# Patient Record
Sex: Female | Born: 1970 | Race: White | Hispanic: No | Smoking: Former smoker
Health system: Southern US, Community
[De-identification: ages and names within clinical notes are randomized; demographics above are authoritative.]

## PROBLEM LIST (undated history)

## (undated) DIAGNOSIS — Z87442 Personal history of urinary calculi: Secondary | ICD-10-CM

## (undated) DIAGNOSIS — F32A Depression, unspecified: Secondary | ICD-10-CM

## (undated) DIAGNOSIS — R079 Chest pain, unspecified: Secondary | ICD-10-CM

## (undated) DIAGNOSIS — E039 Hypothyroidism, unspecified: Secondary | ICD-10-CM

## (undated) DIAGNOSIS — E669 Obesity, unspecified: Secondary | ICD-10-CM

## (undated) DIAGNOSIS — J309 Allergic rhinitis, unspecified: Secondary | ICD-10-CM

## (undated) DIAGNOSIS — F419 Anxiety disorder, unspecified: Secondary | ICD-10-CM

## (undated) DIAGNOSIS — H269 Unspecified cataract: Secondary | ICD-10-CM

## (undated) DIAGNOSIS — E119 Type 2 diabetes mellitus without complications: Secondary | ICD-10-CM

## (undated) DIAGNOSIS — I10 Essential (primary) hypertension: Secondary | ICD-10-CM

## (undated) DIAGNOSIS — J449 Chronic obstructive pulmonary disease, unspecified: Secondary | ICD-10-CM

## (undated) DIAGNOSIS — R739 Hyperglycemia, unspecified: Secondary | ICD-10-CM

## (undated) DIAGNOSIS — F329 Major depressive disorder, single episode, unspecified: Secondary | ICD-10-CM

## (undated) DIAGNOSIS — J45909 Unspecified asthma, uncomplicated: Secondary | ICD-10-CM

## (undated) DIAGNOSIS — Z720 Tobacco use: Secondary | ICD-10-CM

## (undated) DIAGNOSIS — E7800 Pure hypercholesterolemia, unspecified: Secondary | ICD-10-CM

## (undated) DIAGNOSIS — G2581 Restless legs syndrome: Secondary | ICD-10-CM

## (undated) HISTORY — DX: Depression, unspecified: F32.A

## (undated) HISTORY — DX: Pure hypercholesterolemia, unspecified: E78.00

## (undated) HISTORY — PX: TONSILLECTOMY: SUR1361

## (undated) HISTORY — DX: Major depressive disorder, single episode, unspecified: F32.9

## (undated) HISTORY — DX: Tobacco use: Z72.0

## (undated) HISTORY — PX: FOOT MASS EXCISION: SHX1663

## (undated) HISTORY — PX: ABDOMINAL HYSTERECTOMY: SHX81

## (undated) HISTORY — PX: OTHER SURGICAL HISTORY: SHX169

## (undated) HISTORY — PX: THYROID SURGERY: SHX805

## (undated) HISTORY — DX: Chronic obstructive pulmonary disease, unspecified: J44.9

## (undated) HISTORY — DX: Hyperglycemia, unspecified: R73.9

## (undated) HISTORY — PX: CHOLECYSTECTOMY: SHX55

## (undated) HISTORY — DX: Unspecified asthma, uncomplicated: J45.909

## (undated) HISTORY — DX: Essential (primary) hypertension: I10

## (undated) HISTORY — DX: Type 2 diabetes mellitus without complications: E11.9

## (undated) HISTORY — PX: RADICAL ABDOMINAL HYSTERECTOMY: SUR659

## (undated) HISTORY — DX: Obesity, unspecified: E66.9

## (undated) HISTORY — DX: Allergic rhinitis, unspecified: J30.9

## (undated) HISTORY — PX: EXCISION MASS NECK: SHX6703

## (undated) HISTORY — PX: NECK SURGERY: SHX720

## (undated) HISTORY — DX: Anxiety disorder, unspecified: F41.9

## (undated) HISTORY — DX: Unspecified cataract: H26.9

## (undated) HISTORY — DX: Chest pain, unspecified: R07.9

## (undated) HISTORY — DX: Restless legs syndrome: G25.81

---

## 2001-06-20 ENCOUNTER — Emergency Department (HOSPITAL_COMMUNITY): Admission: EM | Admit: 2001-06-20 | Discharge: 2001-06-20 | Payer: Self-pay | Admitting: Emergency Medicine

## 2010-10-18 DIAGNOSIS — R079 Chest pain, unspecified: Secondary | ICD-10-CM

## 2010-10-19 DIAGNOSIS — I2510 Atherosclerotic heart disease of native coronary artery without angina pectoris: Secondary | ICD-10-CM

## 2011-02-04 ENCOUNTER — Other Ambulatory Visit: Payer: Self-pay | Admitting: Internal Medicine

## 2011-02-04 DIAGNOSIS — R928 Other abnormal and inconclusive findings on diagnostic imaging of breast: Secondary | ICD-10-CM

## 2011-02-22 ENCOUNTER — Ambulatory Visit
Admission: RE | Admit: 2011-02-22 | Discharge: 2011-02-22 | Disposition: A | Discharge status: Home / Self Care | Payer: PRIVATE HEALTH INSURANCE | Source: Ambulatory Visit | Attending: Internal Medicine | Admitting: Internal Medicine

## 2011-02-22 ENCOUNTER — Other Ambulatory Visit: Payer: Self-pay | Admitting: Internal Medicine

## 2011-02-22 DIAGNOSIS — R928 Other abnormal and inconclusive findings on diagnostic imaging of breast: Secondary | ICD-10-CM

## 2011-08-04 ENCOUNTER — Other Ambulatory Visit: Payer: Self-pay | Admitting: Internal Medicine

## 2011-08-04 DIAGNOSIS — Z1231 Encounter for screening mammogram for malignant neoplasm of breast: Secondary | ICD-10-CM

## 2011-08-11 ENCOUNTER — Ambulatory Visit
Admission: RE | Admit: 2011-08-11 | Discharge: 2011-08-11 | Disposition: A | Discharge status: Home / Self Care | Payer: PRIVATE HEALTH INSURANCE | Source: Ambulatory Visit | Attending: Internal Medicine | Admitting: Internal Medicine

## 2011-08-11 DIAGNOSIS — Z1231 Encounter for screening mammogram for malignant neoplasm of breast: Secondary | ICD-10-CM

## 2012-01-23 DIAGNOSIS — R079 Chest pain, unspecified: Secondary | ICD-10-CM

## 2012-02-15 DIAGNOSIS — R079 Chest pain, unspecified: Secondary | ICD-10-CM

## 2012-05-02 HISTORY — PX: CARDIAC CATHETERIZATION: SHX172

## 2012-06-22 ENCOUNTER — Encounter: Payer: Self-pay | Admitting: Physician Assistant

## 2012-06-22 DIAGNOSIS — R079 Chest pain, unspecified: Secondary | ICD-10-CM

## 2012-07-03 ENCOUNTER — Encounter: Payer: Self-pay | Admitting: Cardiology

## 2012-07-06 ENCOUNTER — Encounter: Payer: PRIVATE HEALTH INSURANCE | Admitting: Physician Assistant

## 2012-07-13 ENCOUNTER — Encounter: Payer: Self-pay | Admitting: Physician Assistant

## 2012-07-13 ENCOUNTER — Other Ambulatory Visit: Payer: Self-pay | Admitting: Physician Assistant

## 2012-07-13 ENCOUNTER — Encounter: Payer: Self-pay | Admitting: *Deleted

## 2012-07-13 ENCOUNTER — Ambulatory Visit (INDEPENDENT_AMBULATORY_CARE_PROVIDER_SITE_OTHER): Payer: PRIVATE HEALTH INSURANCE | Admitting: Physician Assistant

## 2012-07-13 VITALS — BP 123/74 | HR 74 | Ht 64.0 in | Wt 213.8 lb

## 2012-07-13 DIAGNOSIS — E1159 Type 2 diabetes mellitus with other circulatory complications: Secondary | ICD-10-CM | POA: Insufficient documentation

## 2012-07-13 DIAGNOSIS — Z87891 Personal history of nicotine dependence: Secondary | ICD-10-CM | POA: Insufficient documentation

## 2012-07-13 DIAGNOSIS — R079 Chest pain, unspecified: Secondary | ICD-10-CM

## 2012-07-13 DIAGNOSIS — F17200 Nicotine dependence, unspecified, uncomplicated: Secondary | ICD-10-CM

## 2012-07-13 DIAGNOSIS — Z01810 Encounter for preprocedural cardiovascular examination: Secondary | ICD-10-CM

## 2012-07-13 DIAGNOSIS — I10 Essential (primary) hypertension: Secondary | ICD-10-CM

## 2012-07-13 DIAGNOSIS — Z720 Tobacco use: Secondary | ICD-10-CM

## 2012-07-13 LAB — PROTIME-INR

## 2012-07-13 MED ORDER — METOPROLOL TARTRATE 25 MG PO TABS: 25.0000 mg | ORAL_TABLET | Freq: Two times a day (BID) | ORAL | Status: DC

## 2012-07-13 MED ORDER — NITROGLYCERIN 0.4 MG SL SUBL: 0.4000 mg | SUBLINGUAL_TABLET | SUBLINGUAL | Status: DC | PRN

## 2012-07-13 NOTE — Assessment & Plan Note (Signed)
Patient was advised to stop smoking tobacco.

## 2012-07-13 NOTE — Progress Notes (Signed)
Primary Cardiologist: Simona Huh, MD (new)   HPI: Post hospital followup from [facility], following recent evaluation for chest pain.  Patient presented with long-standing history of CP, multiple CRFs, but no documented CAD. She had been previously evaluated by stress testing, with a negative dobutamine stress echocardiogram in June 2012, followed by a NL Lexiscan Cardiolite; EF 60%, in October 2013.  Serial cardiac markers were normal, as was a d-dimer level. EKG indicated NSR with no ischemic changes.  She presents to our office today, for the first time, with continued complaint of chest pain. In fact, she describes 2 different types: A long-standing, constant chest pain, as well as a "pressure" which occurs only on occasion, but is exacerbated by exertion or moderate activity.  Allergies  Allergen Reactions  . Hydromorphone   . Valproic Acid And Related     Current Outpatient Prescriptions  Medication Sig Dispense Refill  . aspirin 81 MG tablet Take 81 mg by mouth daily.      Marland Kitchen gabapentin (NEURONTIN) 300 MG capsule Take 300 mg by mouth 3 (three) times daily.      Marland Kitchen lisinopril (PRINIVIL,ZESTRIL) 20 MG tablet Take 20 mg by mouth daily.      . sertraline (ZOLOFT) 100 MG tablet Take 100 mg by mouth daily.       No current facility-administered medications for this visit.    Past Medical History  Diagnosis Date  . Chest pain syndrome   . HTN (hypertension)   . Anxiety and depression   . Asthma   . Nephrolithiasis   . Obesity   . Hyperglycemia   . Obesity   . Tobacco abuse     Past Surgical History  Procedure Laterality Date  . Cholecystectomy    . Hysteectomy --- unknown    . Cesarean section    . Neck surgery    . Left foot surgery      History   Social History  . Marital Status: Single    Spouse Name: N/A    Number of Children: N/A  . Years of Education: N/A   Occupational History  . Not on file.   Social History Main Topics  . Smoking status: Current Every  Day Smoker -- 0.25 packs/day    Types: Cigarettes  . Smokeless tobacco: Not on file  . Alcohol Use: No  . Drug Use: No  . Sexually Active: Not on file   Other Topics Concern  . Not on file   Social History Narrative  . No narrative on file    No family history on file.  ROS: no nausea, vomiting; no fever, chills; no melena, hematochezia; no claudication  PHYSICAL EXAM: BP 123/74  Pulse 74  Ht 5\' 4"  (1.626 m)  Wt 213 lb 12.8 oz (96.979 kg)  BMI 36.68 kg/m2 GENERAL: 42 year old female, obese; NAD HEENT: NCAT, PERRLA, EOMI; sclera clear; no xanthelasma NECK: palpable bilateral carotid pulses, no bruits; no JVD; no TM LUNGS: CTA bilaterally CARDIAC: RRR (S1, S2); no significant murmurs; no rubs or gallops ABDOMEN: soft, protuberant EXTREMETIES: no significant peripheral edema SKIN: warm/dry; no obvious rash/lesions MUSCULOSKELETAL: no joint deformity NEURO: no focal deficit; NL affect   EKG:    ASSESSMENT & PLAN:  Chest pain syndrome Recommendation is to now pursue an invasive evaluation with a diagnostic cardiac catheterization, so as to exclude significant underlying CAD. She presents with long-standing history of CP, multiple CRFs, and 2 previous normal stress tests, most recently in October 2013. The patient is quite agreeable  with this recommendation. The risks/benefits were reviewed, and plan was approved by Dr. Diona Browner, who saw the patient initially in consultation, here at [facility]. Regarding medications, we will provide prescription for NTG and start low-dose beta blocker with Lopressor 25 twice a day. Would also recommend adding a statin, if she has evidence of significant CAD.  HTN (hypertension) Followed by primary M.D. Well-controlled on current medication regimen.  Tobacco abuse Patient was advised to stop smoking tobacco.    Gene Rithy Mandley, PAC

## 2012-07-13 NOTE — Assessment & Plan Note (Signed)
Followed by primary M.D. Well-controlled on current medication regimen.

## 2012-07-13 NOTE — Assessment & Plan Note (Signed)
Recommendation is to now pursue an invasive evaluation with a diagnostic cardiac catheterization, so as to exclude significant underlying CAD. The patient is quite agreeable with this recommendation. The risks/benefits were reviewed, and plan was approved by Dr. Diona Browner, who saw the patient initially in consultation, here at [facility]. Regarding medications, we will provide prescription for NTG and start low-dose beta blocker with Lopressor 25 twice a day. Would also recommend adding a statin, if she has evidence of significant CAD.

## 2012-07-13 NOTE — Patient Instructions (Signed)
   Nitroglycerin as needed for severe chest pain only  Lopressor 25mg  twice a day    Left JV heart cath - see info sheet given  Follow up will be given after above

## 2012-07-17 ENCOUNTER — Telehealth: Payer: Self-pay | Admitting: Physician Assistant

## 2012-07-17 NOTE — Telephone Encounter (Signed)
AUTH COMPLETE

## 2012-07-17 NOTE — Telephone Encounter (Signed)
Left JV heart cath - Wednesday, 3/19 - 11:30 - Excell Seltzer

## 2012-07-18 ENCOUNTER — Encounter (HOSPITAL_BASED_OUTPATIENT_CLINIC_OR_DEPARTMENT_OTHER): Payer: Self-pay | Admitting: *Deleted

## 2012-07-18 ENCOUNTER — Encounter (HOSPITAL_BASED_OUTPATIENT_CLINIC_OR_DEPARTMENT_OTHER)
Admission: RE | Disposition: A | Discharge status: Home / Self Care | Payer: Self-pay | Source: Ambulatory Visit | Attending: Cardiovascular Disease

## 2012-07-18 ENCOUNTER — Inpatient Hospital Stay (HOSPITAL_BASED_OUTPATIENT_CLINIC_OR_DEPARTMENT_OTHER)
Admission: RE | Admit: 2012-07-18 | Discharge: 2012-07-18 | Disposition: A | Discharge status: Home / Self Care | Payer: PRIVATE HEALTH INSURANCE | Source: Ambulatory Visit | Attending: Cardiovascular Disease | Admitting: Cardiovascular Disease

## 2012-07-18 DIAGNOSIS — F17200 Nicotine dependence, unspecified, uncomplicated: Secondary | ICD-10-CM | POA: Insufficient documentation

## 2012-07-18 DIAGNOSIS — I10 Essential (primary) hypertension: Secondary | ICD-10-CM | POA: Insufficient documentation

## 2012-07-18 DIAGNOSIS — R079 Chest pain, unspecified: Secondary | ICD-10-CM

## 2012-07-18 DIAGNOSIS — R0789 Other chest pain: Secondary | ICD-10-CM | POA: Insufficient documentation

## 2012-07-18 SURGERY — JV LEFT HEART CATHETERIZATION WITH CORONARY ANGIOGRAM
Anesthesia: Moderate Sedation

## 2012-07-18 MED ORDER — SODIUM CHLORIDE 0.9 % IV SOLN
INTRAVENOUS | Status: DC
  Administered 2012-07-18: 11:00:00 via INTRAVENOUS

## 2012-07-18 MED ORDER — SODIUM CHLORIDE 0.9 % IJ SOLN
3.0000 mL | Freq: Two times a day (BID) | INTRAMUSCULAR | Status: DC
Start: 2012-07-18 — End: 2012-07-18

## 2012-07-18 MED ORDER — SODIUM CHLORIDE 0.9 % IJ SOLN: 3.0000 mL | INTRAMUSCULAR | Status: DC | PRN

## 2012-07-18 MED ORDER — SODIUM CHLORIDE 0.9 % IV SOLN: 1.0000 mL/kg/h | INTRAVENOUS | Status: DC

## 2012-07-18 MED ORDER — ACETAMINOPHEN 325 MG PO TABS: 650.0000 mg | ORAL_TABLET | ORAL | Status: DC | PRN

## 2012-07-18 MED ORDER — SODIUM CHLORIDE 0.9 % IV SOLN: 250.0000 mL | INTRAVENOUS | Status: DC | PRN

## 2012-07-18 MED ORDER — ONDANSETRON HCL 4 MG/2ML IJ SOLN: 4.0000 mg | Freq: Four times a day (QID) | INTRAMUSCULAR | Status: DC | PRN

## 2012-07-18 MED ORDER — ASPIRIN 81 MG PO CHEW
324.0000 mg | CHEWABLE_TABLET | ORAL | Status: AC
  Administered 2012-07-18: 243 mg via ORAL

## 2012-07-18 NOTE — Interval H&P Note (Signed)
History and Physical Interval Note:  07/18/2012 1:01 PM  [name]  has presented today for surgery, with the diagnosis of abd st  The various methods of treatment have been discussed with the patient and family. After consideration of risks, benefits and other options for treatment, the patient has consented to  Procedure(s): JV LEFT HEART CATHETERIZATION WITH CORONARY ANGIOGRAM (N/A) as a surgical intervention .  The patient's history has been reviewed, patient examined, no change in status, stable for surgery.  I have reviewed the patient's chart and labs.  Questions were answered to the patient's satisfaction.     Sherry Glass

## 2012-07-18 NOTE — CV Procedure (Signed)
   Cardiac Catheterization Procedure Note  Name: Sherry Glass MRN: [id number] DOB: May 10, 1970  Procedure: Left Heart Cath, Selective Coronary Angiography, LV angiography  Indication: Chest pain  Procedural details: The right groin was prepped, draped, and anesthetized with 1% lidocaine. Using modified Seldinger technique, a 4 French sheath was introduced into the right femoral artery. Standard Judkins catheters were used for coronary angiography and left ventriculography. Catheter exchanges were performed over a guidewire. There were no immediate procedural complications. The patient was transferred to the post catheterization recovery area for further monitoring.  Procedural Findings: Hemodynamics:  AO 146/79 mean 105 LV 154/18   Coronary angiography: Coronary dominance: right  Left mainstem: Widely patent. No obstructive disease  Left anterior descending (LAD): Large vessel with large D1. Smooth throughout. No obstructive disease.  Left circumflex (LCx): Widely patent. Large vessel proximally. OM branches patent.  Right coronary artery (RCA): Large, dominant vessel. Smooth throughout. PDA and PLA branches without stenosis.  Left ventriculography: Left ventricular systolic function is normal, LVEF is estimated at 55-65%, there is no significant mitral regurgitation   Final Conclusions:   1. Normal coronary arteries 2. Normal LV function  Recommendations: Suspect noncardiac chest pain.  [name] 07/18/2012, 1:22 PM

## 2012-07-18 NOTE — H&P (View-Only) (Signed)
Primary Cardiologist: Simona Huh, MD (new)   HPI: Post hospital followup from [facility], following recent evaluation for chest pain.  Patient presented with long-standing history of CP, multiple CRFs, but no documented CAD. She had been previously evaluated by stress testing, with a negative dobutamine stress echocardiogram in June 2012, followed by a NL Lexiscan Cardiolite; EF 60%, in October 2013.  Serial cardiac markers were normal, as was a d-dimer level. EKG indicated NSR with no ischemic changes.  She presents to our office today, for the first time, with continued complaint of chest pain. In fact, she describes 2 different types: A long-standing, constant chest pain, as well as a "pressure" which occurs only on occasion, but is exacerbated by exertion or moderate activity.  Allergies  Allergen Reactions  . Hydromorphone   . Valproic Acid And Related     Current Outpatient Prescriptions  Medication Sig Dispense Refill  . aspirin 81 MG tablet Take 81 mg by mouth daily.      Marland Kitchen gabapentin (NEURONTIN) 300 MG capsule Take 300 mg by mouth 3 (three) times daily.      Marland Kitchen lisinopril (PRINIVIL,ZESTRIL) 20 MG tablet Take 20 mg by mouth daily.      . sertraline (ZOLOFT) 100 MG tablet Take 100 mg by mouth daily.       No current facility-administered medications for this visit.    Past Medical History  Diagnosis Date  . Chest pain syndrome   . HTN (hypertension)   . Anxiety and depression   . Asthma   . Nephrolithiasis   . Obesity   . Hyperglycemia   . Obesity   . Tobacco abuse     Past Surgical History  Procedure Laterality Date  . Cholecystectomy    . Hysteectomy --- unknown    . Cesarean section    . Neck surgery    . Left foot surgery      History   Social History  . Marital Status: Single    Spouse Name: N/A    Number of Children: N/A  . Years of Education: N/A   Occupational History  . Not on file.   Social History Main Topics  . Smoking status: Current Every  Day Smoker -- 0.25 packs/day    Types: Cigarettes  . Smokeless tobacco: Not on file  . Alcohol Use: No  . Drug Use: No  . Sexually Active: Not on file   Other Topics Concern  . Not on file   Social History Narrative  . No narrative on file    No family history on file.  ROS: no nausea, vomiting; no fever, chills; no melena, hematochezia; no claudication  PHYSICAL EXAM: BP 123/74  Pulse 74  Ht 5\' 4"  (1.626 m)  Wt 213 lb 12.8 oz (96.979 kg)  BMI 36.68 kg/m2 GENERAL: 42 year old female, obese; NAD HEENT: NCAT, PERRLA, EOMI; sclera clear; no xanthelasma NECK: palpable bilateral carotid pulses, no bruits; no JVD; no TM LUNGS: CTA bilaterally CARDIAC: RRR (S1, S2); no significant murmurs; no rubs or gallops ABDOMEN: soft, protuberant EXTREMETIES: no significant peripheral edema SKIN: warm/dry; no obvious rash/lesions MUSCULOSKELETAL: no joint deformity NEURO: no focal deficit; NL affect   EKG:    ASSESSMENT & PLAN:  Chest pain syndrome Recommendation is to now pursue an invasive evaluation with a diagnostic cardiac catheterization, so as to exclude significant underlying CAD. She presents with long-standing history of CP, multiple CRFs, and 2 previous normal stress tests, most recently in October 2013. The patient is quite agreeable  with this recommendation. The risks/benefits were reviewed, and plan was approved by Dr. Diona Browner, who saw the patient initially in consultation, here at [facility]. Regarding medications, we will provide prescription for NTG and start low-dose beta blocker with Lopressor 25 twice a day. Would also recommend adding a statin, if she has evidence of significant CAD.  HTN (hypertension) Followed by primary M.D. Well-controlled on current medication regimen.  Tobacco abuse Patient was advised to stop smoking tobacco.    Gene Sevin Farone, PAC

## 2012-08-01 ENCOUNTER — Ambulatory Visit (INDEPENDENT_AMBULATORY_CARE_PROVIDER_SITE_OTHER): Payer: PRIVATE HEALTH INSURANCE | Admitting: Physician Assistant

## 2012-08-01 ENCOUNTER — Encounter: Payer: Self-pay | Admitting: Physician Assistant

## 2012-08-01 VITALS — BP 131/88 | HR 84 | Ht 64.0 in | Wt 216.0 lb

## 2012-08-01 DIAGNOSIS — R079 Chest pain, unspecified: Secondary | ICD-10-CM

## 2012-08-01 NOTE — Patient Instructions (Signed)
   Stop Aspirin Continue all other current medications. Follow up as needed

## 2012-08-01 NOTE — Progress Notes (Signed)
Primary Cardiologist: Simona Huh, MD   HPI: Post catheterization followup.  When last seen in office, patient was referred for a diagnostic cardiac catheterization for definitive exclusion of underlying CAD, in the context of long-standing history of CP, multiple CRFs, and 2 previous normal stress tests, most recently in October 2013.   - Cardiac catheterization, March 19: Normal coronary arteries and normal LVF (EF 55-60%)  Given this, the patient's symptoms were felt to be noncardiac in etiology.  She returns today reporting no complications of the R groin incision site. She did, however, become quite tearful, over the concern that she may lose her job due to numerous recent absences from work, including a recent hospitalization.  Allergies  Allergen Reactions  . Hydromorphone   . Valproic Acid And Related     Current Outpatient Prescriptions  Medication Sig Dispense Refill  . gabapentin (NEURONTIN) 300 MG capsule Take 300 mg by mouth 3 (three) times daily.      Marland Kitchen lisinopril (PRINIVIL,ZESTRIL) 20 MG tablet Take 20 mg by mouth daily.      . metoprolol tartrate (LOPRESSOR) 25 MG tablet Take 1 tablet (25 mg total) by mouth 2 (two) times daily.  60 tablet  6  . nitroGLYCERIN (NITROSTAT) 0.4 MG SL tablet Place 1 tablet (0.4 mg total) under the tongue every 5 (five) minutes as needed for chest pain.  25 tablet  3  . sertraline (ZOLOFT) 100 MG tablet Take 100 mg by mouth daily.       No current facility-administered medications for this visit.    Past Medical History  Diagnosis Date  . Chest pain syndrome   . HTN (hypertension)   . Anxiety and depression   . Asthma   . Nephrolithiasis   . Obesity   . Hyperglycemia   . Obesity   . Tobacco abuse     Past Surgical History  Procedure Laterality Date  . Cholecystectomy    . Hysteectomy --- unknown    . Cesarean section    . Neck surgery    . Left foot surgery      History   Social History  . Marital Status: Single   Spouse Name: N/A    Number of Children: N/A  . Years of Education: N/A   Occupational History  . Not on file.   Social History Main Topics  . Smoking status: Former Smoker -- 0.25 packs/day    Types: Cigarettes    Quit date: 07/24/2012  . Smokeless tobacco: Not on file  . Alcohol Use: No  . Drug Use: No  . Sexually Active: Not on file   Other Topics Concern  . Not on file   Social History Narrative  . No narrative on file    No family history on file.  ROS: no nausea, vomiting; no fever, chills; no melena, hematochezia; no claudication  PHYSICAL EXAM: BP 131/88  Pulse 84  Ht 5\' 4"  (1.626 m)  Wt 216 lb (97.977 kg)  BMI 37.06 kg/m2 GENERAL: 42 year old female, obese; NAD  HEENT: NCAT, PERRLA, EOMI; sclera clear; no xanthelasma  NECK: palpable bilateral carotid pulses, no bruits; no JVD; no TM  LUNGS: CTA bilaterally  CARDIAC: RRR (S1, S2); no significant murmurs; no rubs or gallops  ABDOMEN: soft, protuberant  EXTREMETIES: no significant peripheral edema  SKIN: warm/dry; no obvious rash/lesions  MUSCULOSKELETAL: no joint deformity  NEURO: no focal deficit; NL affect    EKG:   ASSESSMENT & PLAN:  Chest pain syndrome I reviewed the results  of her recent normal cardiac catheterization with her, and tried to provide reassurance that from a cardiac standpoint there is no obvious source for her long-standing history of chest pain. She appeared to understand this, but became somewhat distraught over the fact that this question remains unanswered. She is also concerned that she may lose her job, given that she has missed work on several occasions, including a recent hospitalization. We assured her that we would do whatever we could to help her in this regard, but that she is to otherwise resume followup with Dr. Sherryll Burger for ongoing evaluation of her recurrent symptoms. From a cardiac standpoint, she no longer needs to be on ASA, and may return to Dr. Diona Browner on an as-needed  basis.    Gene Jaxxen Voong, PAC

## 2012-08-01 NOTE — Assessment & Plan Note (Signed)
I reviewed the results of her recent normal cardiac catheterization with her, and tried to provide reassurance that from a cardiac standpoint there is no obvious source for her long-standing history of chest pain. She appeared to understand this, but became somewhat distraught over the fact that this question remains unanswered. She is also concerned that she may lose her job, given that she has missed work on several occasions, including a recent hospitalization. We assured her that we would do whatever we could to help her in this regard, but that she is to otherwise resume followup with Dr. Sherryll Burger for ongoing evaluation of her recurrent symptoms. From a cardiac standpoint, she no longer needs to be on ASA, and may return to Dr. Diona Browner on an as-needed basis.

## 2012-10-14 DIAGNOSIS — R079 Chest pain, unspecified: Secondary | ICD-10-CM

## 2016-05-11 DIAGNOSIS — H25032 Anterior subcapsular polar age-related cataract, left eye: Secondary | ICD-10-CM | POA: Diagnosis not present

## 2016-05-11 DIAGNOSIS — E119 Type 2 diabetes mellitus without complications: Secondary | ICD-10-CM | POA: Diagnosis not present

## 2016-05-11 DIAGNOSIS — H25042 Posterior subcapsular polar age-related cataract, left eye: Secondary | ICD-10-CM | POA: Diagnosis not present

## 2016-05-11 DIAGNOSIS — H2513 Age-related nuclear cataract, bilateral: Secondary | ICD-10-CM | POA: Diagnosis not present

## 2016-05-11 LAB — HM DIABETES EYE EXAM

## 2016-05-13 ENCOUNTER — Ambulatory Visit (INDEPENDENT_AMBULATORY_CARE_PROVIDER_SITE_OTHER): Payer: Medicaid Other

## 2016-05-13 ENCOUNTER — Other Ambulatory Visit: Payer: Self-pay | Admitting: Family

## 2016-05-13 ENCOUNTER — Ambulatory Visit (INDEPENDENT_AMBULATORY_CARE_PROVIDER_SITE_OTHER): Payer: Medicaid Other | Admitting: Family

## 2016-05-13 ENCOUNTER — Encounter: Payer: Self-pay | Admitting: Family

## 2016-05-13 VITALS — BP 136/80 | HR 73 | Temp 97.1°F | Ht 63.0 in | Wt 210.4 lb

## 2016-05-13 DIAGNOSIS — J449 Chronic obstructive pulmonary disease, unspecified: Secondary | ICD-10-CM

## 2016-05-13 DIAGNOSIS — E669 Obesity, unspecified: Secondary | ICD-10-CM | POA: Insufficient documentation

## 2016-05-13 DIAGNOSIS — Z0000 Encounter for general adult medical examination without abnormal findings: Secondary | ICD-10-CM | POA: Diagnosis not present

## 2016-05-13 DIAGNOSIS — F17200 Nicotine dependence, unspecified, uncomplicated: Secondary | ICD-10-CM

## 2016-05-13 DIAGNOSIS — F17210 Nicotine dependence, cigarettes, uncomplicated: Secondary | ICD-10-CM | POA: Insufficient documentation

## 2016-05-13 DIAGNOSIS — E1165 Type 2 diabetes mellitus with hyperglycemia: Secondary | ICD-10-CM

## 2016-05-13 DIAGNOSIS — E119 Type 2 diabetes mellitus without complications: Secondary | ICD-10-CM | POA: Insufficient documentation

## 2016-05-13 LAB — BAYER DCA HB A1C WAIVED: HB A1C (BAYER DCA - WAIVED): 8.5 % — ABNORMAL HIGH (ref <7.0)

## 2016-05-13 MED ORDER — CYCLOBENZAPRINE HCL 10 MG PO TABS: 10.0000 mg | ORAL_TABLET | Freq: Three times a day (TID) | ORAL | 0 refills | Status: DC | PRN

## 2016-05-13 MED ORDER — METFORMIN HCL ER 500 MG PO TB24: 500.0000 mg | ORAL_TABLET | Freq: Every day | ORAL | 1 refills | Status: DC

## 2016-05-13 MED ORDER — FLUTICASONE FUROATE-VILANTEROL 100-25 MCG/INH IN AEPB: 1.0000 | INHALATION_SPRAY | Freq: Every day | RESPIRATORY_TRACT | 6 refills | Status: DC

## 2016-05-13 NOTE — Progress Notes (Signed)
Subjective:    Patient ID: Sherry Glass, female    DOB: [birth year], 46 y.o.   MRN: [id number]  Pt presents to the office today to establish care. PT states she has taken her blood glucose at home and her blood sugar was running in the 400's. Pt currently not taking any medications at this time. Pt denies any headache, palpitations or edema at this time. PT is a current smoker and states she has SOB at times.   Diabetes  She presents for her initial diabetic visit. She has type 2 diabetes mellitus. There are no hypoglycemic associated symptoms. Associated symptoms include blurred vision and visual change. Pertinent negatives for diabetes include no foot paresthesias and no polyphagia. There are no hypoglycemic complications. Symptoms are worsening. Pertinent negatives for diabetic complications include no CVA, heart disease, nephropathy or peripheral neuropathy. Risk factors for coronary artery disease include diabetes mellitus, obesity, stress, post-menopausal and sedentary lifestyle. When asked about current treatments, none were reported. Eye exam is current (05/11/16).  Leg Pain   There was no injury mechanism. The pain is present in the left leg, right hip, right leg and left hip. The pain is at a severity of 8/10. The pain is moderate. The pain has been intermittent since onset. She reports no foreign bodies present. She has tried rest and NSAIDs for the symptoms. The treatment provided mild relief.  COPD PT currently smoking "about 8 cigarettes a day".     Review of Systems  Eyes: Positive for blurred vision.  Endocrine: Negative for polyphagia.  All other systems reviewed and are negative.  Social History  Substance Use Topics  . Smoking status: Current Every Day Smoker  . Smokeless tobacco: Never Used  . Alcohol use Yes     Comment: For special occasions   Family History  Problem Relation Age of Onset  . Adopted: Yes  . Family history unknown: Yes        Objective:   Physical Exam  Constitutional: She is oriented to person, place, and time. She appears well-developed and well-nourished. No distress.  Obese   HENT:  Head: Normocephalic and atraumatic.  Right Ear: External ear normal.  Mouth/Throat: Oropharynx is clear and moist.  Eyes: Pupils are equal, round, and reactive to light.  Neck: Normal range of motion. Neck supple. No thyromegaly present.  Cardiovascular: Normal rate, regular rhythm, normal heart sounds and intact distal pulses.   No murmur heard. Pulmonary/Chest: Effort normal. No respiratory distress. She has no wheezes. She has rhonchi.  Abdominal: Soft. Bowel sounds are normal. She exhibits no distension. There is no tenderness.  Musculoskeletal: Normal range of motion. She exhibits no edema or tenderness.  Neurological: She is alert and oriented to person, place, and time. She has normal reflexes. No cranial nerve deficit.  Skin: Skin is warm and dry.  Psychiatric: She has a normal mood and affect. Her behavior is normal. Judgment and thought content normal.  Vitals reviewed.     BP 136/80   Pulse 73   Temp 97.1 F (36.2 C) (Oral)   Ht _0  (1.6 m)   Wt 210 lb 6.4 oz (95.4 kg)   BMI 37.27 kg/m      Assessment & Plan:  1. Annual physical exam - Bayer DCA Hb A1c Waived - CMP14+EGFR - Lipid panel - Microalbumin / creatinine urine ratio - Thyroid Panel With TSH - VITAMIN D 25 Hydroxy (Vit-D Deficiency, Fractures) - DG Chest 2 View; Future  2. Obesity (BMI 30-39.9) -  CMP14+EGFR  3. Type 2 diabetes mellitus with hyperglycemia, without long-term current use of insulin (HCC) -Pt started on metformin today -Low carb diet discussed -Pt needs appt with Tammy asap as she is  New diabetic - Bayer DCA Hb A1c Waived - CMP14+EGFR - Lipid panel - Microalbumin / creatinine urine ratio - metFORMIN (GLUCOPHAGE XR) 500 MG 24 hr tablet; Take 1 tablet (500 mg total) by mouth daily with breakfast.  Dispense: 90 tablet; Refill:  1  4. Chronic obstructive pulmonary disease, unspecified COPD type ([place]) -Smoking cessation discussed -Breo started today - CMP14+EGFR - DG Chest 2 View; Future - fluticasone furoate-vilanterol (BREO ELLIPTA) 100-25 MCG/INH AEPB; Inhale 1 puff into the lungs daily.  Dispense: 1 each; Refill: 6  5. Current smoker - DG Chest 2 View; Future   Continue all meds Labs pending Health Maintenance reviewed Diet and exercise encouraged RTO 1 month n  Evelina Dun, [name]

## 2016-05-13 NOTE — Patient Instructions (Signed)
Diabetes Mellitus and Food It is important for you to manage your blood sugar (glucose) level. Your blood glucose level can be greatly affected by what you eat. Eating healthier foods in the appropriate amounts throughout the day at about the same time each day will help you control your blood glucose level. It can also help slow or prevent worsening of your diabetes mellitus. Healthy eating may even help you improve the level of your blood pressure and reach or maintain a healthy weight. General recommendations for healthful eating and cooking habits include:  Eating meals and snacks regularly. Avoid going long periods of time without eating to lose weight.  Eating a diet that consists mainly of plant-based foods, such as fruits, vegetables, nuts, legumes, and whole grains.  Using low-heat cooking methods, such as baking, instead of high-heat cooking methods, such as deep frying.  Work with your dietitian to make sure you understand how to use the Nutrition Facts information on food labels. How can food affect me? Carbohydrates Carbohydrates affect your blood glucose level more than any other type of food. Your dietitian will help you determine how many carbohydrates to eat at each meal and teach you how to count carbohydrates. Counting carbohydrates is important to keep your blood glucose at a healthy level, especially if you are using insulin or taking certain medicines for diabetes mellitus. Alcohol Alcohol can cause sudden decreases in blood glucose (hypoglycemia), especially if you use insulin or take certain medicines for diabetes mellitus. Hypoglycemia can be a life-threatening condition. Symptoms of hypoglycemia (sleepiness, dizziness, and disorientation) are similar to symptoms of having too much alcohol. If your health care provider has given you approval to drink alcohol, do so in moderation and use the following guidelines:  Women should not have more than one drink per day, and men  should not have more than two drinks per day. One drink is equal to: ? 12 oz of beer. ? 5 oz of wine. ? 1 oz of hard liquor.  Do not drink on an empty stomach.  Keep yourself hydrated. Have water, diet soda, or unsweetened iced tea.  Regular soda, juice, and other mixers might contain a lot of carbohydrates and should be counted.  What foods are not recommended? As you make food choices, it is important to remember that all foods are not the same. Some foods have fewer nutrients per serving than other foods, even though they might have the same number of calories or carbohydrates. It is difficult to get your body what it needs when you eat foods with fewer nutrients. Examples of foods that you should avoid that are high in calories and carbohydrates but low in nutrients include:  Trans fats (most processed foods list trans fats on the Nutrition Facts label).  Regular soda.  Juice.  Candy.  Sweets, such as cake, pie, doughnuts, and cookies.  Fried foods.  What foods can I eat? Eat nutrient-rich foods, which will nourish your body and keep you healthy. The food you should eat also will depend on several factors, including:  The calories you need.  The medicines you take.  Your weight.  Your blood glucose level.  Your blood pressure level.  Your cholesterol level.  You should eat a variety of foods, including:  Protein. ? Lean cuts of meat. ? Proteins low in saturated fats, such as fish, egg whites, and beans. Avoid processed meats.  Fruits and vegetables. ? Fruits and vegetables that may help control blood glucose levels, such as apples,   mangoes, and yams.  Dairy products. ? Choose fat-free or low-fat dairy products, such as milk, yogurt, and cheese.  Grains, bread, pasta, and rice. ? Choose whole grain products, such as multigrain bread, whole oats, and brown rice. These foods may help control blood pressure.  Fats. ? Foods containing healthful fats, such as  nuts, avocado, olive oil, canola oil, and fish.  Does everyone with diabetes mellitus have the same meal plan? Because every person with diabetes mellitus is different, there is not one meal plan that works for everyone. It is very important that you meet with a dietitian who will help you create a meal plan that is just right for you. This information is not intended to replace advice given to you by your health care provider. Make sure you discuss any questions you have with your health care provider. Document Released: 01/13/2005 Document Revised: 09/24/2015 Document Reviewed: 03/15/2013 Elsevier Interactive Patient Education  2017 [employer]  

## 2016-05-14 LAB — CMP14+EGFR
ALT: 41 IU/L — ABNORMAL HIGH (ref 0–32)
AST: 23 IU/L (ref 0–40)
Albumin/Globulin Ratio: 1.6 (ref 1.2–2.2)
Albumin: 4.4 g/dL (ref 3.5–5.5)
Alkaline Phosphatase: 87 IU/L (ref 39–117)
BUN/Creatinine Ratio: 17 (ref 9–23)
BUN: 9 mg/dL (ref 6–24)
Bilirubin Total: 0.6 mg/dL (ref 0.0–1.2)
CO2: 24 mmol/L (ref 18–29)
Calcium: 9.9 mg/dL (ref 8.7–10.2)
Chloride: 97 mmol/L (ref 96–106)
Creatinine, Ser: 0.53 mg/dL — ABNORMAL LOW (ref 0.57–1.00)
GFR calc Af Amer: 131 mL/min/{1.73_m2} (ref >59)
GFR calc non Af Amer: 113 mL/min/{1.73_m2} (ref >59)
Globulin, Total: 2.7 g/dL (ref 1.5–4.5)
Glucose: 240 mg/dL — ABNORMAL HIGH (ref 65–99)
Potassium: 4.9 mmol/L (ref 3.5–5.2)
Sodium: 139 mmol/L (ref 134–144)
Total Protein: 7.1 g/dL (ref 6.0–8.5)

## 2016-05-14 LAB — LIPID PANEL
Chol/HDL Ratio: 4 ratio units (ref 0.0–4.4)
Cholesterol, Total: 202 mg/dL — ABNORMAL HIGH (ref 100–199)
HDL: 50 mg/dL (ref >39)
LDL Calculated: 102 mg/dL — ABNORMAL HIGH (ref 0–99)
Triglycerides: 248 mg/dL — ABNORMAL HIGH (ref 0–149)
VLDL Cholesterol Cal: 50 mg/dL — ABNORMAL HIGH (ref 5–40)

## 2016-05-14 LAB — MICROALBUMIN / CREATININE URINE RATIO
Creatinine, Urine: 139.8 mg/dL
Microalb/Creat Ratio: 9.3 mg/g creat (ref 0.0–30.0)
Microalbumin, Urine: 13 ug/mL

## 2016-05-14 LAB — THYROID PANEL WITH TSH
Free Thyroxine Index: 3.2 (ref 1.2–4.9)
T3 Uptake Ratio: 33 % (ref 24–39)
T4, Total: 9.8 ug/dL (ref 4.5–12.0)
TSH: <0.006 u[IU]/mL — ABNORMAL LOW (ref 0.450–4.500)

## 2016-05-14 LAB — VITAMIN D 25 HYDROXY (VIT D DEFICIENCY, FRACTURES): Vit D, 25-Hydroxy: 15.2 ng/mL — ABNORMAL LOW (ref 30.0–100.0)

## 2016-05-16 ENCOUNTER — Other Ambulatory Visit: Payer: Self-pay | Admitting: Family

## 2016-05-16 DIAGNOSIS — E559 Vitamin D deficiency, unspecified: Secondary | ICD-10-CM | POA: Insufficient documentation

## 2016-05-16 DIAGNOSIS — E0590 Thyrotoxicosis, unspecified without thyrotoxic crisis or storm: Secondary | ICD-10-CM | POA: Insufficient documentation

## 2016-05-16 MED ORDER — VITAMIN D (ERGOCALCIFEROL) 1.25 MG (50000 UNIT) PO CAPS: 50000.0000 [IU] | ORAL_CAPSULE | ORAL | 3 refills | Status: DC

## 2016-05-17 ENCOUNTER — Telehealth: Payer: Self-pay

## 2016-05-17 ENCOUNTER — Encounter: Payer: Self-pay | Admitting: Physician Assistant

## 2016-05-18 MED ORDER — BUDESONIDE-FORMOTEROL FUMARATE 80-4.5 MCG/ACT IN AERO: 2.0000 | INHALATION_SPRAY | Freq: Two times a day (BID) | RESPIRATORY_TRACT | 3 refills | Status: DC

## 2016-05-18 NOTE — Telephone Encounter (Signed)
Breo changed to Symbicort per insurance  

## 2016-05-19 NOTE — Telephone Encounter (Signed)
Aware of medication change. 

## 2016-05-24 ENCOUNTER — Other Ambulatory Visit: Payer: Self-pay

## 2016-05-24 MED ORDER — BUDESONIDE-FORMOTEROL FUMARATE 80-4.5 MCG/ACT IN AERO: 2.0000 | INHALATION_SPRAY | Freq: Two times a day (BID) | RESPIRATORY_TRACT | 3 refills | Status: DC

## 2016-05-26 ENCOUNTER — Encounter: Payer: Self-pay | Admitting: Pharmacist

## 2016-05-26 ENCOUNTER — Ambulatory Visit (INDEPENDENT_AMBULATORY_CARE_PROVIDER_SITE_OTHER): Payer: Medicaid Other | Admitting: Pharmacist

## 2016-05-26 VITALS — BP 146/78 | HR 82 | Ht 63.0 in | Wt 209.0 lb

## 2016-05-26 DIAGNOSIS — E1165 Type 2 diabetes mellitus with hyperglycemia: Secondary | ICD-10-CM | POA: Diagnosis not present

## 2016-05-26 DIAGNOSIS — I10 Essential (primary) hypertension: Secondary | ICD-10-CM | POA: Diagnosis not present

## 2016-05-26 DIAGNOSIS — Z79899 Other long term (current) drug therapy: Secondary | ICD-10-CM | POA: Diagnosis not present

## 2016-05-26 MED ORDER — GLUCOSE BLOOD VI STRP: ORAL_STRIP | 2 refills | Status: DC

## 2016-05-26 MED ORDER — LISINOPRIL 10 MG PO TABS: 10.0000 mg | ORAL_TABLET | Freq: Every day | ORAL | 2 refills | Status: DC

## 2016-05-26 MED ORDER — SITAGLIP PHOS-METFORMIN HCL ER 100-1000 MG PO TB24: 1.0000 | ORAL_TABLET | Freq: Every day | ORAL | 2 refills | Status: DC

## 2016-05-26 MED ORDER — ACCU-CHEK FASTCLIX LANCETS MISC: 2 refills | Status: DC

## 2016-05-26 NOTE — Progress Notes (Signed)
Patient ID: Sherry Florasonia M Seiple, female   DOB: 1970/10/11, 46 y.o.   MRN: [id number]   Subjective:    Sherry Glass is a 46 y.o. female who presents for an initial evaluation of Type 2 diabetes mellitus.  Current symptoms/problems include hyperglycemia, polydipsia and polyuria and have been improving. Symptoms have been present for 3 months.  The patient was initially diagnosed with Type 2 diabetes mellitus about 1 year ago.  She is new to our practice and started seeing Jannifer Rodneyhristy Hawks, NP earlier this month.  Known diabetic complications: peripheral neuropathy Cardiovascular risk factors: diabetes mellitus, dyslipidemia, hypertension, obesity (BMI >= 30 kg/m2), sedentary lifestyle and smoking/ tobacco exposure Current diabetic medications include started metformin XR 500mg  QD about 2 weeks ago.   Eye exam current (within one year): yes Weight trend: stable Prior visit with CDE: no Current diet: in general, an "unhealthy" diet Current exercise: none Medication Compliance?  No - patient has not taken lisinopril or metoprolol in over 2 years.  She is not started inhaler.  She was initially prescribed Breo but this is not on Medicaid formulary.  Was chnaged to Symbicort but she was not aware of change and had not picked up yet.   Current monitoring regimen: home blood tests - 2-3 times per week Home blood sugar records: FBG in 190's to 210's ; PM BG 275 to 300's Any episodes of hypoglycemia? no  Is She on ACE inhibitor or angiotensin II receptor blocker?  No       The following portions of the patient's history were reviewed and updated as appropriate: allergies, current medications, past family history, past medical history, past social history, past surgical history and problem list.    Objective:    BP (!) 150/80   Pulse 82   Ht 5\' 3"  (1.6 m)   Wt 209 lb (94.8 kg)   BMI 37.02 kg/m   Lab Review Glucose (mg/dL)  Date Value  14/78/295601/04/2017 240 (H)   CO2 (mmol/L)  Date Value  05/13/2016  24   BUN (mg/dL)  Date Value  21/30/865701/04/2017 9   Creatinine, Ser (mg/dL)  Date Value  84/69/629501/04/2017 0.53 (L)   A1c = 8.5% (05/13/2016)   Assessment:    Diabetes Mellitus type II, under improving  control.   HTN Medication management   Plan:    1.  Rx changes: change metformin XR to Janumet XR 100/1000mg  take 1 tablet daily with food   Restart lisinopril 10mg  take 1 tablet daily  Explained that Breo was not on formulary and it was changed to Symbicort - reviewed different directions with Symbicort. 2.  Education: Reviewed 'ABCs' of diabetes management (respective goals in parentheses):  A1C (<7), blood pressure (<130/80), and cholesterol (LDL <100). 3. Discussed pathophysiology of DM; difference between type 1 and type 2 DM. 4. CHO counting diet discussed.  Reviewed CHO amount in various foods and how to read nutrition labels.  Discussed recommended serving sizes.  5.  Recommend check BG 2  times a day.  Patient was given Accu check Guide glucometer.  Test strip and lancet Rx sent to pharmacy 6.  Recommended increase physical activity - goal is 150 minutes per week 7. Follow up: 2 weeks with PCP for recheck SOB and BP           3 months with me / CDE

## 2016-05-26 NOTE — Patient Instructions (Addendum)
If you don't hear about referral to specialist regarding your thyroid by Monday, January 29th then call office at [phone] and ask to speak to [facility] in referrals.   Diabetes and Standards of Medical Care   Diabetes is complicated. You may find that your diabetes team includes a dietitian, nurse, diabetes educator, eye doctor, and more. To help everyone know what is going on and to help you get the care you deserve, the following schedule of care was developed to help keep you on track. Below are the tests, exams, vaccines, medicines, education, and plans you will need.  Blood Glucose Goals Prior to meals = 80 - 130 Within 2 hours of the start of a meal = less than 180  HbA1c test (goal is less than 7.0% - your last value was 8.5%) This test shows how well you have controlled your glucose over the past 2 to 3 months. It is used to see if your diabetes management plan needs to be adjusted.   It is performed at least 2 times a year if you are meeting treatment goals.  It is performed 4 times a year if therapy has changed or if you are not meeting treatment goals.  Blood pressure test  This test is performed at every routine medical visit. The goal is less than 140/90 mmHg for most people, but 130/80 mmHg in some cases. Ask your health care provider about your goal.  Dental exam  Follow up with the dentist regularly.  Eye exam  If you are diagnosed with type 1 diabetes as a child, get an exam upon reaching the age of 42 years or older and have had diabetes for 3 to 5 years. Yearly eye exams are recommended after that initial eye exam.  If you are diagnosed with type 1 diabetes as an adult, get an exam within 5 years of diagnosis and then yearly.  If you are diagnosed with type 2 diabetes, get an exam as soon as possible after the diagnosis and then yearly.  Foot care exam  Visual foot exams are performed at every routine medical visit. The exams check for cuts, injuries, or  other problems with the feet.  A comprehensive foot exam should be done yearly. This includes visual inspection as well as assessing foot pulses and testing for loss of sensation.  Check your feet nightly for cuts, injuries, or other problems with your feet. Tell your health care provider if anything is not healing.  Kidney function test (urine microalbumin)  This test is performed once a year.  Type 1 diabetes: The first test is performed 5 years after diagnosis.  Type 2 diabetes: The first test is performed at the time of diagnosis.  A serum creatinine and estimated glomerular filtration rate (eGFR) test is done once a year to assess the level of chronic kidney disease (CKD), if present.  Lipid profile (cholesterol, HDL, LDL, triglycerides)  Performed every 5 years for most people.  The goal for LDL is less than 100 mg/dL. If you are at high risk, the goal is less than 70 mg/dL.  The goal for HDL is 40 mg/dL to 50 mg/dL for men and 50 mg/dL to 60 mg/dL for women. An HDL cholesterol of 60 mg/dL or higher gives some protection against heart disease.  The goal for triglycerides is less than 150 mg/dL.  Influenza vaccine, pneumococcal vaccine, and hepatitis B vaccine  The influenza vaccine is recommended yearly.  The pneumococcal vaccine is generally given once in  a lifetime. However, there are some instances when another vaccination is recommended. Check with your health care provider.  The hepatitis B vaccine is also recommended for adults with diabetes.  Diabetes self-management education  Education is recommended at diagnosis and ongoing as needed.  Treatment plan  Your treatment plan is reviewed at every medical visit.  Document Released: 02/13/2009 Document Revised: 12/19/2012 Document Reviewed: 09/18/2012 [facility] Patient Information 2014 [place].

## 2016-06-06 DIAGNOSIS — H25812 Combined forms of age-related cataract, left eye: Secondary | ICD-10-CM | POA: Diagnosis not present

## 2016-06-06 DIAGNOSIS — E119 Type 2 diabetes mellitus without complications: Secondary | ICD-10-CM | POA: Diagnosis not present

## 2016-06-06 DIAGNOSIS — H25091 Other age-related incipient cataract, right eye: Secondary | ICD-10-CM | POA: Diagnosis not present

## 2016-06-06 DIAGNOSIS — H40013 Open angle with borderline findings, low risk, bilateral: Secondary | ICD-10-CM | POA: Diagnosis not present

## 2016-06-09 ENCOUNTER — Ambulatory Visit (INDEPENDENT_AMBULATORY_CARE_PROVIDER_SITE_OTHER): Payer: Medicaid Other | Admitting: Family

## 2016-06-09 ENCOUNTER — Encounter: Payer: Self-pay | Admitting: Family

## 2016-06-09 VITALS — BP 131/78 | HR 78 | Temp 97.0°F | Ht 63.0 in | Wt 212.0 lb

## 2016-06-09 DIAGNOSIS — E0590 Thyrotoxicosis, unspecified without thyrotoxic crisis or storm: Secondary | ICD-10-CM | POA: Diagnosis not present

## 2016-06-09 DIAGNOSIS — F17200 Nicotine dependence, unspecified, uncomplicated: Secondary | ICD-10-CM

## 2016-06-09 DIAGNOSIS — E785 Hyperlipidemia, unspecified: Secondary | ICD-10-CM | POA: Diagnosis not present

## 2016-06-09 DIAGNOSIS — F419 Anxiety disorder, unspecified: Secondary | ICD-10-CM

## 2016-06-09 DIAGNOSIS — F418 Other specified anxiety disorders: Secondary | ICD-10-CM | POA: Diagnosis not present

## 2016-06-09 DIAGNOSIS — E1169 Type 2 diabetes mellitus with other specified complication: Secondary | ICD-10-CM | POA: Insufficient documentation

## 2016-06-09 DIAGNOSIS — E1165 Type 2 diabetes mellitus with hyperglycemia: Secondary | ICD-10-CM | POA: Diagnosis not present

## 2016-06-09 DIAGNOSIS — F329 Major depressive disorder, single episode, unspecified: Secondary | ICD-10-CM

## 2016-06-09 DIAGNOSIS — J449 Chronic obstructive pulmonary disease, unspecified: Secondary | ICD-10-CM

## 2016-06-09 DIAGNOSIS — F32A Depression, unspecified: Secondary | ICD-10-CM | POA: Insufficient documentation

## 2016-06-09 MED ORDER — ESCITALOPRAM OXALATE 10 MG PO TABS: 10.0000 mg | ORAL_TABLET | Freq: Every day | ORAL | 3 refills | Status: DC

## 2016-06-09 MED ORDER — PRAVASTATIN SODIUM 20 MG PO TABS: 20.0000 mg | ORAL_TABLET | Freq: Every day | ORAL | 3 refills | Status: DC

## 2016-06-09 NOTE — Progress Notes (Addendum)
Subjective:    Patient ID: Sherry Glass, female    DOB: [birth year], 46 y.o.   MRN: [id number]  PT presents to the office today to recheck COPD and DM 2. Pt was started on Symbicort  for her COP. States she can not tell difference, she continues to smoker 5 cigarettes. Pt states she feels stressed and anxious  which makes smoking cessation difficult. Pt states she has felt anxious for months, but over the last few weeks it has become worse.  Diabetes  She presents for her follow-up diabetic visit. She has type 2 diabetes mellitus. Her disease course has been improving. Hypoglycemia symptoms include nervousness/anxiousness. Pertinent negatives for diabetes include no blurred vision, no foot paresthesias and no foot ulcerations. Risk factors for coronary artery disease include dyslipidemia, obesity, tobacco exposure, stress and sedentary lifestyle. Current diabetic treatment includes oral agent (dual therapy). She is compliant with treatment all of the time. She is following a generally healthy diet. Her breakfast blood glucose range is generally 180-200 mg/dl. Eye exam is current.  Anxiety  Presents for follow-up visit. Symptoms include depressed mood, excessive worry, irritability, nervous/anxious behavior and restlessness. Symptoms occur most days. The severity of symptoms is moderate.    Hyperlipidemia  This is a new problem. The current episode started more than 1 month ago. The problem is uncontrolled. Recent lipid tests were reviewed and are high. Exacerbating diseases include obesity. Current antihyperlipidemic treatment includes diet change. The current treatment provides no improvement of lipids. Risk factors for coronary artery disease include diabetes mellitus, dyslipidemia, hypertension and a sedentary lifestyle.      Review of Systems  Constitutional: Positive for irritability.  Eyes: Negative for blurred vision.  Psychiatric/Behavioral: The patient is nervous/anxious.   All other  systems reviewed and are negative.      Objective:   Physical Exam  Constitutional: She is oriented to person, place, and time. She appears well-developed and well-nourished. No distress.  HENT:  Head: Normocephalic and atraumatic.  Right Ear: External ear normal.  Left Ear: External ear normal.  Nose: Nose normal.  Mouth/Throat: Oropharynx is clear and moist.  Eyes: Pupils are equal, round, and reactive to light.  Neck: No thyromegaly present.  Cardiovascular: Normal rate, regular rhythm, normal heart sounds and intact distal pulses.   No murmur heard. Pulmonary/Chest: Effort normal and breath sounds normal. No respiratory distress. She has no wheezes.  Abdominal: Soft. Bowel sounds are normal. She exhibits no distension. There is no tenderness.  Musculoskeletal: Normal range of motion. She exhibits no edema or tenderness.  Neurological: She is alert and oriented to person, place, and time.  Skin: Skin is warm and dry.  Psychiatric: She has a normal mood and affect. Her behavior is normal. Judgment and thought content normal.  Vitals reviewed.    BP 131/78   Pulse 78   Temp 97 F (36.1 C) (Oral)   Ht '5\' 3"'  (1.6 m)   Wt 212 lb (96.2 kg)   SpO2 98%   BMI 37.55 kg/m       Assessment & Plan:  1. Chronic obstructive pulmonary disease, unspecified COPD type ([place]) - CMP14+EGFR  2. Type 2 diabetes mellitus with hyperglycemia, without long-term current use of insulin ([facility]) Continue Janumet XR  Low Carb  - CMP14+EGFR  3. Current smoker -Smoking cessation discused - CMP14+EGFR  4. Anxiety and depression -Pt started on Lexapro 10 mg today Stress management  - CMP14+EGFR - escitalopram (LEXAPRO) 10 MG tablet; Take 1 tablet (10 mg  total) by mouth daily.  Dispense: 90 tablet; Refill: 3  5. Hyperlipidemia, unspecified hyperlipidemia type -Will start pravastatin today -Low fat diet  - CMP14+EGFR - pravastatin (PRAVACHOL) 20 MG tablet; Take 1 tablet (20 mg total) by mouth  daily.  Dispense: 90 tablet; Refill: 3  6. Hyperthyroidism Will retest Thyroid panel today, has Endocrinologists appt March 1 - Thyroid Panel With TSH - CMP14+EGFR   Continue all meds Labs pending Health Maintenance reviewed Diet and exercise encouraged RTO 6 weeks to recheck GAD  Evelina Dun, [name]

## 2016-06-09 NOTE — Patient Instructions (Signed)
Stress and Stress Management Stress is a normal reaction to life events. It is what you feel when life demands more than you are used to or more than you can handle. Some stress can be useful. For example, the stress reaction can help you catch the last bus of the day, study for a test, or meet a deadline at work. But stress that occurs too often or for too long can cause problems. It can affect your emotional health and interfere with relationships and normal daily activities. Too much stress can weaken your immune system and increase your risk for physical illness. If you already have a medical problem, stress can make it worse. What are the causes? All sorts of life events may cause stress. An event that causes stress for one person may not be stressful for another person. Major life events commonly cause stress. These may be positive or negative. Examples include losing your job, moving into a new home, getting married, having a baby, or losing a loved one. Less obvious life events may also cause stress, especially if they occur day after day or in combination. Examples include working long hours, driving in traffic, caring for children, being in debt, or being in a difficult relationship. What are the signs or symptoms? Stress may cause emotional symptoms including, the following:  Anxiety. This is feeling worried, afraid, on edge, overwhelmed, or out of control.  Anger. This is feeling irritated or impatient.  Depression. This is feeling sad, down, helpless, or guilty.  Difficulty focusing, remembering, or making decisions. Stress may cause physical symptoms, including the following:  Aches and pains. These may affect your head, neck, back, stomach, or other areas of your body.  Tight muscles or clenched jaw.  Low energy or trouble sleeping. Stress may cause unhealthy behaviors, including the following:  Eating to feel better (overeating) or skipping meals.  Sleeping too little, too  much, or both.  Working too much or putting off tasks (procrastination).  Smoking, drinking alcohol, or using drugs to feel better. How is this diagnosed? Stress is diagnosed through an assessment by your health care provider. Your health care provider will ask questions about your symptoms and any stressful life events.Your health care provider will also ask about your medical history and may order blood tests or other tests. Certain medical conditions and medicine can cause physical symptoms similar to stress. Mental illness can cause emotional symptoms and unhealthy behaviors similar to stress. Your health care provider may refer you to a mental health professional for further evaluation. How is this treated? Stress management is the recommended treatment for stress.The goals of stress management are reducing stressful life events and coping with stress in healthy ways. Techniques for reducing stressful life events include the following:  Stress identification. Self-monitor for stress and identify what causes stress for you. These skills may help you to avoid some stressful events.  Time management. Set your priorities, keep a calendar of events, and learn to say "no." These tools can help you avoid making too many commitments. Techniques for coping with stress include the following:  Rethinking the problem. Try to think realistically about stressful events rather than ignoring them or overreacting. Try to find the positives in a stressful situation rather than focusing on the negatives.  Exercise. Physical exercise can release both physical and emotional tension. The key is to find a form of exercise you enjoy and do it regularly.  Relaxation techniques. These relax the body and mind. Examples include yoga,  meditation, tai chi, biofeedback, deep breathing, progressive muscle relaxation, listening to music, being out in nature, journaling, and other hobbies. Again, the key is to find one or  more that you enjoy and can do regularly.  Healthy lifestyle. Eat a balanced diet, get plenty of sleep, and do not smoke. Avoid using alcohol or drugs to relax.  Strong support network. Spend time with family, friends, or other people you enjoy being around.Express your feelings and talk things over with someone you trust. Counseling or talktherapy with a mental health professional may be helpful if you are having difficulty managing stress on your own. Medicine is typically not recommended for the treatment of stress.Talk to your health care provider if you think you need medicine for symptoms of stress. Follow these instructions at home:  Keep all follow-up visits as directed by your health care provider.  Take all medicines as directed by your health care provider. Contact a health care provider if:  Your symptoms get worse or you start having new symptoms.  You feel overwhelmed by your problems and can no longer manage them on your own. Get help right away if:  You feel like hurting yourself or someone else. This information is not intended to replace advice given to you by your health care provider. Make sure you discuss any questions you have with your health care provider. Document Released: 10/12/2000 Document Revised: 09/24/2015 Document Reviewed: 12/11/2012 Elsevier Interactive Patient Education  2017 [employer].

## 2016-06-10 LAB — CMP14+EGFR
ALT: 33 IU/L — ABNORMAL HIGH (ref 0–32)
AST: 19 IU/L (ref 0–40)
Albumin/Globulin Ratio: 1.4 (ref 1.2–2.2)
Albumin: 4.1 g/dL (ref 3.5–5.5)
Alkaline Phosphatase: 80 IU/L (ref 39–117)
BUN/Creatinine Ratio: 15 (ref 9–23)
BUN: 8 mg/dL (ref 6–24)
Bilirubin Total: 0.3 mg/dL (ref 0.0–1.2)
CO2: 22 mmol/L (ref 18–29)
Calcium: 9.9 mg/dL (ref 8.7–10.2)
Chloride: 102 mmol/L (ref 96–106)
Creatinine, Ser: 0.55 mg/dL — ABNORMAL LOW (ref 0.57–1.00)
GFR calc Af Amer: 129 mL/min/{1.73_m2} (ref >59)
GFR calc non Af Amer: 112 mL/min/{1.73_m2} (ref >59)
Globulin, Total: 2.9 g/dL (ref 1.5–4.5)
Glucose: 196 mg/dL — ABNORMAL HIGH (ref 65–99)
Potassium: 4.3 mmol/L (ref 3.5–5.2)
Sodium: 141 mmol/L (ref 134–144)
Total Protein: 7 g/dL (ref 6.0–8.5)

## 2016-06-10 LAB — THYROID PANEL WITH TSH
Free Thyroxine Index: 3.3 (ref 1.2–4.9)
T3 Uptake Ratio: 32 % (ref 24–39)
T4, Total: 10.2 ug/dL (ref 4.5–12.0)
TSH: <0.006 u[IU]/mL — ABNORMAL LOW (ref 0.450–4.500)

## 2016-06-13 NOTE — Patient Instructions (Signed)
Your procedure is scheduled on: 06/23/2016  Report to [facility] at  1150   AM.  Call this number if you have problems the morning of surgery: [phone]   Do not eat food or drink liquids :After Midnight.      Take these medicines the morning of surgery with A SIP OF WATER: Flexaril, lexapro, lisinopril.   Do not wear jewelry, make-up or nail polish.  Do not wear lotions, powders, or perfumes. You may wear deodorant.  Do not shave 48 hours prior to surgery.  Do not bring valuables to the hospital.  Contacts, dentures or bridgework may not be worn into surgery.  Leave suitcase in the car. After surgery it may be brought to your room.  For patients admitted to the hospital, checkout time is 11:00 AM the day of discharge.   Patients discharged the day of surgery will not be allowed to drive home.  :     Please read over the following fact sheets that you were given: Coughing and Deep Breathing, Surgical Site Infection Prevention, Anesthesia Post-op Instructions and Care and Recovery After Surgery    Cataract A cataract is a clouding of the lens of the eye. When a lens becomes cloudy, vision is reduced based on the degree and nature of the clouding. Many cataracts reduce vision to some degree. Some cataracts make people more near-sighted as they develop. Other cataracts increase glare. Cataracts that are ignored and become worse can sometimes look white. The white color can be seen through the pupil. CAUSES   Aging. However, cataracts may occur at any age, even in newborns.   Certain drugs.   Trauma to the eye.   Certain diseases such as diabetes.   Specific eye diseases such as chronic inflammation inside the eye or a sudden attack of a rare form of glaucoma.   Inherited or acquired medical problems.  SYMPTOMS   Gradual, progressive drop in vision in the affected eye.   Severe, rapid visual loss. This most often happens when trauma is the cause.  DIAGNOSIS  To detect a  cataract, an eye doctor examines the lens. Cataracts are best diagnosed with an exam of the eyes with the pupils enlarged (dilated) by drops.  TREATMENT  For an early cataract, vision may improve by using different eyeglasses or stronger lighting. If that does not help your vision, surgery is the only effective treatment. A cataract needs to be surgically removed when vision loss interferes with your everyday activities, such as driving, reading, or watching TV. A cataract may also have to be removed if it prevents examination or treatment of another eye problem. Surgery removes the cloudy lens and usually replaces it with a substitute lens (intraocular lens, IOL).  At a time when both you and your doctor agree, the cataract will be surgically removed. If you have cataracts in both eyes, only one is usually removed at a time. This allows the operated eye to heal and be out of danger from any possible problems after surgery (such as infection or poor wound healing). In rare cases, a cataract may be doing damage to your eye. In these cases, your caregiver may advise surgical removal right away. The vast majority of people who have cataract surgery have better vision afterward. HOME CARE INSTRUCTIONS  If you are not planning surgery, you may be asked to do the following:  Use different eyeglasses.   Use stronger or brighter lighting.   Ask your eye doctor about reducing your  medicine dose or changing medicines if it is thought that a medicine caused your cataract. Changing medicines does not make the cataract go away on its own.   Become familiar with your surroundings. Poor vision can lead to injury. Avoid bumping into things on the affected side. You are at a higher risk for tripping or falling.   Exercise extreme care when driving or operating machinery.   Wear sunglasses if you are sensitive to bright light or experiencing problems with glare.  SEEK IMMEDIATE MEDICAL CARE IF:   You have a  worsening or sudden vision loss.   You notice redness, swelling, or increasing pain in the eye.   You have a fever.  Document Released: 04/18/2005 Document Revised: 04/07/2011 Document Reviewed: 12/10/2010 [facility] Patient Information 2012 [place].PATIENT INSTRUCTIONS POST-ANESTHESIA  IMMEDIATELY FOLLOWING SURGERY:  Do not drive or operate machinery for the first twenty four hours after surgery.  Do not make any important decisions for twenty four hours after surgery or while taking narcotic pain medications or sedatives.  If you develop intractable nausea and vomiting or a severe headache please notify your doctor immediately.  FOLLOW-UP:  Please make an appointment with your surgeon as instructed. You do not need to follow up with anesthesia unless specifically instructed to do so.  WOUND CARE INSTRUCTIONS (if applicable):  Keep a dry clean dressing on the anesthesia/puncture wound site if there is drainage.  Once the wound has quit draining you may leave it open to air.  Generally you should leave the bandage intact for twenty four hours unless there is drainage.  If the epidural site drains for more than 36-48 hours please call the anesthesia department.  QUESTIONS?:  Please feel free to call your physician or the hospital operator if you have any questions, and they will be happy to assist you.

## 2016-06-16 ENCOUNTER — Encounter (HOSPITAL_COMMUNITY): Payer: Self-pay

## 2016-06-16 ENCOUNTER — Encounter (HOSPITAL_COMMUNITY)
Admission: RE | Admit: 2016-06-16 | Discharge: 2016-06-16 | Disposition: A | Discharge status: Home / Self Care | Payer: Medicaid Other | Source: Ambulatory Visit | Attending: Ophthalmology | Admitting: Ophthalmology

## 2016-06-16 DIAGNOSIS — Z01818 Encounter for other preprocedural examination: Secondary | ICD-10-CM | POA: Diagnosis not present

## 2016-06-16 HISTORY — DX: Personal history of urinary calculi: Z87.442

## 2016-06-16 HISTORY — DX: Major depressive disorder, single episode, unspecified: F32.9

## 2016-06-16 HISTORY — DX: Depression, unspecified: F32.A

## 2016-06-16 LAB — CBC WITH DIFFERENTIAL/PLATELET
Basophils Absolute: 0 10*3/uL (ref 0.0–0.1)
Basophils Relative: 0 %
Eosinophils Absolute: 0.1 10*3/uL (ref 0.0–0.7)
Eosinophils Relative: 1 %
HCT: 45.2 % (ref 36.0–46.0)
Hemoglobin: 15.1 g/dL — ABNORMAL HIGH (ref 12.0–15.0)
Lymphocytes Relative: 43 %
Lymphs Abs: 4.4 10*3/uL — ABNORMAL HIGH (ref 0.7–4.0)
MCH: 30.2 pg (ref 26.0–34.0)
MCHC: 33.4 g/dL (ref 30.0–36.0)
MCV: 90.4 fL (ref 78.0–100.0)
Monocytes Absolute: 0.8 10*3/uL (ref 0.1–1.0)
Monocytes Relative: 8 %
Neutro Abs: 4.9 10*3/uL (ref 1.7–7.7)
Neutrophils Relative %: 48 %
Platelets: 259 10*3/uL (ref 150–400)
RBC: 5 MIL/uL (ref 3.87–5.11)
RDW: 13.2 % (ref 11.5–15.5)
WBC: 10.4 10*3/uL (ref 4.0–10.5)

## 2016-06-22 ENCOUNTER — Other Ambulatory Visit: Payer: Self-pay | Admitting: Family

## 2016-06-23 ENCOUNTER — Encounter (HOSPITAL_COMMUNITY)
Admission: RE | Disposition: A | Discharge status: Home / Self Care | Payer: Self-pay | Source: Ambulatory Visit | Attending: Ophthalmology

## 2016-06-23 ENCOUNTER — Encounter (HOSPITAL_COMMUNITY): Payer: Self-pay | Admitting: *Deleted

## 2016-06-23 ENCOUNTER — Ambulatory Visit (HOSPITAL_COMMUNITY)
Admission: RE | Admit: 2016-06-23 | Discharge: 2016-06-23 | Disposition: A | Discharge status: Home / Self Care | Payer: Medicaid Other | Source: Ambulatory Visit | Attending: Ophthalmology | Admitting: Ophthalmology

## 2016-06-23 ENCOUNTER — Ambulatory Visit (HOSPITAL_COMMUNITY): Payer: Medicaid Other | Admitting: Anesthesiology

## 2016-06-23 DIAGNOSIS — F329 Major depressive disorder, single episode, unspecified: Secondary | ICD-10-CM | POA: Insufficient documentation

## 2016-06-23 DIAGNOSIS — F419 Anxiety disorder, unspecified: Secondary | ICD-10-CM | POA: Insufficient documentation

## 2016-06-23 DIAGNOSIS — F17200 Nicotine dependence, unspecified, uncomplicated: Secondary | ICD-10-CM | POA: Insufficient documentation

## 2016-06-23 DIAGNOSIS — I10 Essential (primary) hypertension: Secondary | ICD-10-CM | POA: Insufficient documentation

## 2016-06-23 DIAGNOSIS — H25812 Combined forms of age-related cataract, left eye: Secondary | ICD-10-CM | POA: Insufficient documentation

## 2016-06-23 DIAGNOSIS — E119 Type 2 diabetes mellitus without complications: Secondary | ICD-10-CM | POA: Diagnosis not present

## 2016-06-23 DIAGNOSIS — Z7984 Long term (current) use of oral hypoglycemic drugs: Secondary | ICD-10-CM | POA: Diagnosis not present

## 2016-06-23 DIAGNOSIS — J449 Chronic obstructive pulmonary disease, unspecified: Secondary | ICD-10-CM | POA: Diagnosis not present

## 2016-06-23 DIAGNOSIS — Z79899 Other long term (current) drug therapy: Secondary | ICD-10-CM | POA: Diagnosis not present

## 2016-06-23 HISTORY — PX: CATARACT EXTRACTION W/PHACO: SHX586

## 2016-06-23 LAB — GLUCOSE, CAPILLARY: Glucose-Capillary: 157 mg/dL — ABNORMAL HIGH (ref 65–99)

## 2016-06-23 SURGERY — PHACOEMULSIFICATION, CATARACT, WITH IOL INSERTION
Anesthesia: Monitor Anesthesia Care | Site: Eye | Laterality: Left

## 2016-06-23 MED ORDER — ACETAMINOPHEN 325 MG PO TABS
650.0000 mg | ORAL_TABLET | Freq: Once | ORAL | Status: AC
  Administered 2016-06-23: 650 mg via ORAL

## 2016-06-23 MED ORDER — CYCLOPENTOLATE-PHENYLEPHRINE 0.2-1 % OP SOLN
1.0000 [drp] | OPHTHALMIC | Status: AC
  Administered 2016-06-23 (×3): 1 [drp] via OPHTHALMIC

## 2016-06-23 MED ORDER — EPINEPHRINE PF 1 MG/ML IJ SOLN
INTRAMUSCULAR | Status: AC
  Filled 2016-06-23: qty 1

## 2016-06-23 MED ORDER — POVIDONE-IODINE 5 % OP SOLN
OPHTHALMIC | Status: DC | PRN
  Administered 2016-06-23: 1 via OPHTHALMIC

## 2016-06-23 MED ORDER — MIDAZOLAM HCL 5 MG/5ML IJ SOLN
INTRAMUSCULAR | Status: DC | PRN
  Administered 2016-06-23: 2 mg via INTRAVENOUS

## 2016-06-23 MED ORDER — MIDAZOLAM HCL 2 MG/2ML IJ SOLN
INTRAMUSCULAR | Status: AC
  Filled 2016-06-23: qty 2

## 2016-06-23 MED ORDER — LIDOCAINE HCL 3.5 % OP GEL
1.0000 | Freq: Once | OPHTHALMIC | Status: AC
Start: 2016-06-23 — End: 2016-06-23
  Administered 2016-06-23: 1 via OPHTHALMIC

## 2016-06-23 MED ORDER — PROVISC 10 MG/ML IO SOLN
INTRAOCULAR | Status: DC | PRN
  Administered 2016-06-23: 0.85 mL via INTRAOCULAR

## 2016-06-23 MED ORDER — LIDOCAINE HCL (PF) 1 % IJ SOLN
INTRAMUSCULAR | Status: DC | PRN
  Administered 2016-06-23: 1 mL via OPHTHALMIC

## 2016-06-23 MED ORDER — NEOMYCIN-POLYMYXIN-DEXAMETH 3.5-10000-0.1 OP SUSP
OPHTHALMIC | Status: DC | PRN
  Administered 2016-06-23: 2 [drp] via OPHTHALMIC

## 2016-06-23 MED ORDER — TETRACAINE HCL 0.5 % OP SOLN
1.0000 [drp] | OPHTHALMIC | Status: AC
  Administered 2016-06-23 (×3): 1 [drp] via OPHTHALMIC

## 2016-06-23 MED ORDER — FENTANYL CITRATE (PF) 100 MCG/2ML IJ SOLN
25.0000 ug | INTRAMUSCULAR | Status: AC
  Administered 2016-06-23: 25 ug via INTRAVENOUS

## 2016-06-23 MED ORDER — MIDAZOLAM HCL 2 MG/2ML IJ SOLN
1.0000 mg | INTRAMUSCULAR | Status: AC
  Administered 2016-06-23: 2 mg via INTRAVENOUS

## 2016-06-23 MED ORDER — ACETAMINOPHEN 325 MG PO TABS
ORAL_TABLET | ORAL | Status: AC
  Filled 2016-06-23: qty 2

## 2016-06-23 MED ORDER — LACTATED RINGERS IV SOLN
INTRAVENOUS | Status: DC
  Administered 2016-06-23: 13:00:00 via INTRAVENOUS

## 2016-06-23 MED ORDER — FENTANYL CITRATE (PF) 100 MCG/2ML IJ SOLN
INTRAMUSCULAR | Status: AC
  Filled 2016-06-23: qty 2

## 2016-06-23 MED ORDER — EPINEPHRINE PF 1 MG/ML IJ SOLN
INTRAOCULAR | Status: DC | PRN
  Administered 2016-06-23: 500 mL

## 2016-06-23 MED ORDER — BSS IO SOLN
INTRAOCULAR | Status: DC | PRN
  Administered 2016-06-23: 15 mL via INTRAOCULAR

## 2016-06-23 MED ORDER — PHENYLEPHRINE HCL 2.5 % OP SOLN
1.0000 [drp] | OPHTHALMIC | Status: AC
  Administered 2016-06-23 (×3): 1 [drp] via OPHTHALMIC

## 2016-06-23 SURGICAL SUPPLY — 22 items
CAPSULAR TENSION RING-AMO (OPHTHALMIC RELATED) IMPLANT
CLOTH BEACON ORANGE TIMEOUT ST (SAFETY) ×2 IMPLANT
EYE SHIELD UNIVERSAL CLEAR (GAUZE/BANDAGES/DRESSINGS) ×2 IMPLANT
GLOVE BIOGEL PI IND STRL 6.5 (GLOVE) ×1 IMPLANT
GLOVE BIOGEL PI IND STRL 7.0 (GLOVE) ×1 IMPLANT
GLOVE BIOGEL PI INDICATOR 6.5 (GLOVE) ×1
GLOVE BIOGEL PI INDICATOR 7.0 (GLOVE) ×1
GLOVE EXAM NITRILE LRG STRL (GLOVE) IMPLANT
GLOVE EXAM NITRILE MD LF STRL (GLOVE) IMPLANT
KIT VITRECTOMY (OPHTHALMIC RELATED) ×2 IMPLANT
PAD ARMBOARD 7.5X6 YLW CONV (MISCELLANEOUS) ×2 IMPLANT
PROC W NO LENS (INTRAOCULAR LENS)
PROC W SPEC LENS (INTRAOCULAR LENS)
PROCESS W NO LENS (INTRAOCULAR LENS) IMPLANT
PROCESS W SPEC LENS (INTRAOCULAR LENS) IMPLANT
RETRACTOR IRIS SIGHTPATH (OPHTHALMIC RELATED) IMPLANT
RING MALYGIN (MISCELLANEOUS) IMPLANT
SIGHTPATH CAT PROC W REG LENS (Ophthalmic Related) ×2 IMPLANT
SYRINGE LUER LOK 1CC (MISCELLANEOUS) ×2 IMPLANT
TAPE PAPER 1X10 WHT MICROPORE (GAUZE/BANDAGES/DRESSINGS) ×2 IMPLANT
VISCOELASTIC ADDITIONAL (OPHTHALMIC RELATED) IMPLANT
WATER STERILE IRR 250ML POUR (IV SOLUTION) ×2 IMPLANT

## 2016-06-23 NOTE — Anesthesia Postprocedure Evaluation (Signed)
Anesthesia Post Note  Patient: TEFFANY BLASZCZYK  Procedure(s) Performed: Procedure(s) (LRB): CATARACT EXTRACTION PHACO AND INTRAOCULAR LENS PLACEMENT (IOC) (Left)  Patient location during evaluation: Short Stay Anesthesia Type: MAC Level of consciousness: awake and alert, oriented and patient cooperative Pain management: pain level controlled Vital Signs Assessment: post-procedure vital signs reviewed and stable Respiratory status: spontaneous breathing, nonlabored ventilation and respiratory function stable Cardiovascular status: blood pressure returned to baseline Postop Assessment: no signs of nausea or vomiting Anesthetic complications: no     Last Vitals:  Vitals:   06/23/16 1240 06/23/16 1322  BP: (!) 153/78 139/81  Pulse:  78  Resp: (!) 33 16  Temp:  36.7 C    Last Pain:  Vitals:   06/23/16 1322  TempSrc: Oral                 Jarnell Cordaro J

## 2016-06-23 NOTE — Transfer of Care (Signed)
Immediate Anesthesia Transfer of Care Note  Patient: Sherry Glass  Procedure(s) Performed: Procedure(s) with comments: CATARACT EXTRACTION PHACO AND INTRAOCULAR LENS PLACEMENT (IOC) (Left) - left - pt knows to arrive at 10:50 cde 9.11  Patient Location: Short Stay  Anesthesia Type:MAC  Level of Consciousness: awake and patient cooperative  Airway & Oxygen Therapy: Patient Spontanous Breathing  Post-op Assessment: Report given to RN and Post -op Vital signs reviewed and stable  Post vital signs: Reviewed and stable  Last Vitals:  Vitals:   06/23/16 1237 06/23/16 1240  BP:  (!) 153/78  Pulse: 85   Resp: (!) 23 (!) 33  Temp: 36.9 C     Last Pain:  Vitals:   06/23/16 1237  TempSrc: Oral      Patients Stated Pain Goal: 9 (73/[id number])  Complications: No apparent anesthesia complications

## 2016-06-23 NOTE — Discharge Instructions (Signed)

## 2016-06-23 NOTE — Anesthesia Preprocedure Evaluation (Signed)
Anesthesia Evaluation  Patient identified by MRN, date of birth, ID band Patient awake    Reviewed: Allergy & Precautions, NPO status , Patient's Chart, lab work & pertinent test results  Airway Mallampati: II  TM Distance: >3 FB     Dental  (+) Teeth Intact   Pulmonary asthma , COPD, Current Smoker,    breath sounds clear to auscultation       Cardiovascular hypertension, Pt. on medications + angina (none recently)  Rhythm:Regular Rate:Normal     Neuro/Psych PSYCHIATRIC DISORDERS Anxiety Depression    GI/Hepatic   Endo/Other  diabetes, Type 2Hyperthyroidism   Renal/GU      Musculoskeletal   Abdominal   Peds  Hematology   Anesthesia Other Findings   Reproductive/Obstetrics                             Anesthesia Physical Anesthesia Plan  ASA: III  Anesthesia Plan: MAC   Post-op Pain Management:    Induction: Intravenous  Airway Management Planned: Nasal Cannula  Additional Equipment:   Intra-op Plan:   Post-operative Plan:   Informed Consent: I have reviewed the patients History and Physical, chart, labs and discussed the procedure including the risks, benefits and alternatives for the proposed anesthesia with the patient or authorized representative who has indicated his/her understanding and acceptance.     Plan Discussed with:   Anesthesia Plan Comments:         Anesthesia Quick Evaluation

## 2016-06-23 NOTE — Op Note (Signed)
Date of Admission: 06/23/2016  Date of Surgery: 06/23/2016  Pre-Op Dx: Cataract Left  Eye  Post-Op Dx: Senile Combined Cataract  Left  Eye,  Dx Code Z61.096  Surgeon: Tonny Branch, M.D.  Assistants: None  Anesthesia: Topical with MAC  Indications: Painless, progressive loss of vision with compromise of daily activities.  Surgery: Cataract Extraction with Intraocular lens Implant Left Eye  Discription: The patient had dilating drops and viscous lidocaine placed into the Left eye in the pre-op holding area. After transfer to the operating room, a time out was performed. The patient was then prepped and draped. Beginning with a 10 degree blade a paracentesis port was made at the surgeon's 2 o'clock position. The anterior chamber was then filled with 1% non-preserved lidocaine. This was followed by filling the anterior chamber with Provisc.  A 2.18m keratome blade was used to make a clear corneal incision at the temporal limbus.  A bent cystatome needle was used to create a continuous tear capsulotomy. Hydrodissection was performed with balanced salt solution on a Fine canula. The lens nucleus was then removed using the phacoemulsification handpiece. Residual cortex was removed with the I&A handpiece. The anterior chamber and capsular bag were refilled with Provisc. A posterior chamber intraocular lens was placed into the capsular bag with it's injector. The implant was positioned with the Kuglan hook. The Provisc was then removed from the anterior chamber and capsular bag with the I&A handpiece. Stromal hydration of the main incision and paracentesis port was performed with BSS on a Fine canula. The wounds were tested for leak which was negative. The patient tolerated the procedure well. There were no operative complications. The patient was then transferred to the recovery room in stable condition.  Complications: None  Specimen: None  EBL: None  Prosthetic device: Abbott Technis, PCB00, power  26.0, SN [id number]

## 2016-06-27 ENCOUNTER — Encounter (HOSPITAL_COMMUNITY): Payer: Self-pay | Admitting: Ophthalmology

## 2016-06-30 ENCOUNTER — Encounter: Payer: Self-pay | Admitting: "Endocrinology

## 2016-06-30 ENCOUNTER — Ambulatory Visit (INDEPENDENT_AMBULATORY_CARE_PROVIDER_SITE_OTHER): Payer: Medicaid Other | Admitting: "Endocrinology

## 2016-06-30 ENCOUNTER — Encounter: Payer: Self-pay | Admitting: Family

## 2016-06-30 ENCOUNTER — Ambulatory Visit: Payer: Medicaid Other | Admitting: Family

## 2016-06-30 VITALS — BP 141/84 | HR 105 | Ht 63.0 in | Wt 208.0 lb

## 2016-06-30 DIAGNOSIS — E0590 Thyrotoxicosis, unspecified without thyrotoxic crisis or storm: Secondary | ICD-10-CM | POA: Diagnosis not present

## 2016-06-30 MED ORDER — PROPRANOLOL HCL 20 MG PO TABS: 20.0000 mg | ORAL_TABLET | Freq: Three times a day (TID) | ORAL | 0 refills | Status: DC

## 2016-06-30 NOTE — Progress Notes (Signed)
Subjective:    Patient ID: Sherry Glass, female    DOB: [birth year], PCP Jannifer Rodneyhristy Hawks, [name].   Past Medical History:  Diagnosis Date  . Anxiety and depression   . Asthma   . Cataract   . Chest pain syndrome   . Depression   . Diabetes mellitus without complication (HCC)   . History of kidney stones   . HTN (hypertension)   . Hyperglycemia   . Hypertension   . Obesity   . Obesity   . Tobacco abuse    Past Surgical History:  Procedure Laterality Date  . ABDOMINAL HYSTERECTOMY    . CARDIAC CATHETERIZATION  2014  . CATARACT EXTRACTION W/PHACO Left 06/23/2016   Procedure: CATARACT EXTRACTION PHACO AND INTRAOCULAR LENS PLACEMENT (IOC);  Surgeon: Gemma PayorKerry Hunt, MD;  Location: [facility];  Service: Ophthalmology;  Laterality: Left;  left - pt knows to arrive at 10:50 cde 9.11  . CESAREAN SECTION    . CHOLECYSTECTOMY    . EXCISION MASS NECK     cyst  . FOOT MASS EXCISION    . hysteectomy --- unknown    . left foot surgery     removal of cyst  . NECK SURGERY     Social History   Social History  . Marital status: Widowed    Spouse name: N/A  . Number of children: N/A  . Years of education: N/A   Social History Main Topics  . Smoking status: Current Every Day Smoker    Packs/day: 0.25    Years: 20.00  . Smokeless tobacco: Never Used  . Alcohol use Yes     Comment: For special occasions  . Drug use: No  . Sexual activity: No   Other Topics Concern  . Not on file   Social History Narrative   ** Merged History Encounter **       Outpatient Encounter Prescriptions as of 06/30/2016  Medication Sig  . ACCU-CHEK FASTCLIX LANCETS MISC Use to check BG twice a day  . budesonide-formoterol (SYMBICORT) 80-4.5 MCG/ACT inhaler Inhale 2 puffs into the lungs 2 (two) times daily.  Marland Kitchen. CINNAMON PO Take 1,000 mg by mouth 2 (two) times daily.  . cyclobenzaprine (FLEXERIL) 10 MG tablet Take 1 Tablet by mouth 3 times a day as needed FOR MUSCLE SPASMS  . escitalopram (LEXAPRO) 10 MG tablet  Take 1 tablet (10 mg total) by mouth daily.  Marland Kitchen. glucose blood test strip Use to check BG twice a day  . lisinopril (PRINIVIL,ZESTRIL) 10 MG tablet Take 1 tablet (10 mg total) by mouth daily. For kidney protection and blood pressure  . nitroGLYCERIN (NITROSTAT) 0.4 MG SL tablet Place 1 tablet (0.4 mg total) under the tongue every 5 (five) minutes as needed for chest pain.  . pravastatin (PRAVACHOL) 20 MG tablet Take 1 tablet (20 mg total) by mouth daily.  . propranolol (INDERAL) 20 MG tablet Take 1 tablet (20 mg total) by mouth 3 (three) times daily.  . SitaGLIPtin-MetFORMIN HCl (JANUMET XR) [phone] MG TB24 Take 1 tablet by mouth daily.  . Vitamin D, Ergocalciferol, (DRISDOL) 50000 units CAPS capsule Take 1 capsule (50,000 Units total) by mouth every 7 (seven) days.   No facility-administered encounter medications on file as of 06/30/2016.    ALLERGIES: Allergies  Allergen Reactions  . Dilaudid [Hydromorphone]     Itching and vomitting  . Hydromorphone   . Valproic Acid And Related    VACCINATION STATUS:  There is no immunization history on file  for this patient.  HPI  Sherry Glass presents today with a medical history as above, and is being seen in consultation for hyperthyroidism . Her consult is coming from [facility]- Jannifer Rodney, [name].   The patient has been dealing with symptoms of  weight loss of 10 pounds, anxiety, palpitations, lip disturbance and tremors for 3-6 months.  These symptoms are progressively worsening and troubling to patient.  - She underwent thyroid function tests on 2 separate occasions and showed significantly suppressed TSH 2 less than 0.006 associated with total T4 of 10.2, free thyroxine index of 3.3. The patient denies family history of thyroid dysfunction- she is adopted. - Patient is not on any anti-thyroid medications nor on any thyroid hormone supplements. Patient  is willing to proceed with appropriate work up and therapy for  thyrotoxicosis. - She has uncontrolled type 2 diabetes, upper lipidemia, hypertension on treatment. Her last A1c was 8.5% on 05/13/2016. - She is a chronic heavy smoker currently smoking one pack per day.   Review of Systems   Constitutional: + weight loss, no fatigue, no subjective hyperthermia, no subjective hypothermia Eyes: no blurry vision, no xerophthalmia ENT: no sore throat, no nodules palpated in throat, no dysphagia/odynophagia, no hoarseness Cardiovascular: no Chest Pain, no Shortness of Breath, + palpitations, no leg swelling Respiratory: no cough, no SOB Gastrointestinal: no Nausea/Vomiting/Diarhhea Musculoskeletal: no muscle/joint aches Skin: no rashes Neurological: + tremors, no numbness, no tingling, no dizziness Psychiatric: no depression, + anxiety  Objective:    BP (!) 141/84   Pulse (!) 105   Ht 5\' 3"  (1.6 m)   Wt 208 lb (94.3 kg)   BMI 36.85 kg/m   Wt Readings from Last 3 Encounters:  06/30/16 208 lb (94.3 kg)  06/23/16 207 lb (93.9 kg)  06/16/16 207 lb (93.9 kg)    Physical Exam Constitutional: Significantly over weight for hight, not in acute distress, anxious and fidgety. Eyes: PERRLA, EOMI, no exophthalmos ENT: moist mucous membranes, thyroid is palpable -mild thyromegaly, no cervical lymphadenopathy Cardiovascular: + Active precordium,  + tachycardic at 105, no Murmur/Rubs/Gallops Respiratory:  adequate breathing efforts, no gross chest deformity, Clear to auscultation bilaterally Gastrointestinal: abdomen soft, Non -tender, No distension, Bowel Sounds present Musculoskeletal: no gross deformities, strength intact in all four extremities Skin: moist, warm, no rashes Neurological: + tremor with outstretched hands, Deep tendon reflexes 3+  in all four extremities.  Results for orders placed or performed during the hospital encounter of 06/23/16  Glucose, capillary  Result Value Ref Range   Glucose-Capillary 157 (H) 65 - 99 mg/dL   Complete Blood  Count (Most recent): Lab Results  Component Value Date   WBC 10.4 06/16/2016   HGB 15.1 (H) 06/16/2016   HCT 45.2 06/16/2016   MCV 90.4 06/16/2016   PLT 259 06/16/2016   Chemistry (most recent): Lab Results  Component Value Date   NA 141 06/09/2016   K 4.3 06/09/2016   CL 102 06/09/2016   CO2 22 06/09/2016   BUN 8 06/09/2016   CREATININE 0.55 (L) 06/09/2016   Lipid Panel     Component Value Date/Time   CHOL 202 (H) 05/13/2016 1034   TRIG 248 (H) 05/13/2016 1034   HDL 50 05/13/2016 1034   CHOLHDL 4.0 05/13/2016 1034   LDLCALC 102 (H) 05/13/2016 1034   Results for Sherry Glass, Sherry Glass (MRN [id number]) as of 06/30/2016 14:34  Ref. Range 05/13/2016 10:34 06/09/2016 09:21  TSH Latest Ref Range: 0.450 - 4.500 uIU/mL <0.006 (L) <0.006 (  L)  Thyroxine (T4) Latest Ref Range: 4.5 - 12.0 ug/dL 9.8 16.1  Free Thyroxine Index Latest Ref Range: 1.2 - 4.9  3.2 3.3  T3 Uptake Ratio Latest Ref Range: 24 - 39 % 33 32    Assessment & Plan:   1. Hyperthyroidism  Patient's history and most recent labs are reviewed. Findings are consistent with thyrotoxicosis likely from hyperthyroidism. The potential risks of untreated thyrotoxicosis and the need for definitive therapy have been discussed in detail with the patient, and the patient agrees to proceed with plan.   I like to today confirmatory thyroid uptake and scan will be scheduled to be done as soon as possible.   Therapy may involve RAI ablation of the thyroid, with subsequent need for lifelong thyroid hormone replacement. Pt is made aware of this fact and willing to proceed. The patient will return in  10 days for treatment decision. I  will initiate low dose Propranolol 20 mg by mouth 3 times a day  for symptomatic relief.  - 45 minutes of time was spent on the care of this patient , 50% of which was applied for counseling on complications of thyrotoxicosis, options ,  and the need for definitive therapy to prevent these complications.  - She is  extensively counseled against smoking.   - I advised patient to maintain close follow up with Jannifer Rodney, [name] for primary care needs.  Follow up plan: Return in about 10 days (around 07/10/2016) for thyroid uptake and scan.  Marquis Lunch, MD Phone: [phone]  Fax: [phone]   06/30/2016, 2:26 PM

## 2016-07-11 ENCOUNTER — Encounter (HOSPITAL_COMMUNITY): Admission: RE | Admit: 2016-07-11 | Payer: Medicaid Other | Source: Ambulatory Visit

## 2016-07-12 ENCOUNTER — Other Ambulatory Visit (HOSPITAL_COMMUNITY): Payer: Medicaid Other

## 2016-07-15 ENCOUNTER — Ambulatory Visit: Payer: Self-pay | Admitting: "Endocrinology

## 2016-07-18 ENCOUNTER — Encounter (HOSPITAL_COMMUNITY)
Admission: RE | Admit: 2016-07-18 | Discharge: 2016-07-18 | Disposition: A | Discharge status: Home / Self Care | Payer: Medicaid Other | Source: Ambulatory Visit | Attending: "Endocrinology | Admitting: "Endocrinology

## 2016-07-18 ENCOUNTER — Encounter (HOSPITAL_COMMUNITY): Payer: Self-pay

## 2016-07-18 DIAGNOSIS — E0590 Thyrotoxicosis, unspecified without thyrotoxic crisis or storm: Secondary | ICD-10-CM | POA: Insufficient documentation

## 2016-07-18 MED ORDER — SODIUM IODIDE I-123 7.4 MBQ CAPS
400.0000 | ORAL_CAPSULE | Freq: Once | ORAL | Status: AC
  Administered 2016-07-18: 385 via ORAL

## 2016-07-19 ENCOUNTER — Encounter (HOSPITAL_COMMUNITY)
Admission: RE | Admit: 2016-07-19 | Discharge: 2016-07-19 | Disposition: A | Discharge status: Home / Self Care | Payer: Medicaid Other | Source: Ambulatory Visit | Attending: "Endocrinology | Admitting: "Endocrinology

## 2016-07-19 DIAGNOSIS — E0590 Thyrotoxicosis, unspecified without thyrotoxic crisis or storm: Secondary | ICD-10-CM | POA: Diagnosis not present

## 2016-07-21 ENCOUNTER — Ambulatory Visit (INDEPENDENT_AMBULATORY_CARE_PROVIDER_SITE_OTHER): Payer: Medicaid Other | Admitting: Family

## 2016-07-21 ENCOUNTER — Encounter: Payer: Self-pay | Admitting: Family

## 2016-07-21 VITALS — BP 117/69 | HR 78 | Temp 96.9°F | Ht 63.0 in | Wt 208.4 lb

## 2016-07-21 DIAGNOSIS — I517 Cardiomegaly: Secondary | ICD-10-CM

## 2016-07-21 DIAGNOSIS — R0602 Shortness of breath: Secondary | ICD-10-CM | POA: Diagnosis not present

## 2016-07-21 DIAGNOSIS — J449 Chronic obstructive pulmonary disease, unspecified: Secondary | ICD-10-CM | POA: Diagnosis not present

## 2016-07-21 DIAGNOSIS — F411 Generalized anxiety disorder: Secondary | ICD-10-CM | POA: Diagnosis not present

## 2016-07-21 DIAGNOSIS — F17200 Nicotine dependence, unspecified, uncomplicated: Secondary | ICD-10-CM

## 2016-07-21 DIAGNOSIS — E1165 Type 2 diabetes mellitus with hyperglycemia: Secondary | ICD-10-CM

## 2016-07-21 MED ORDER — TIOTROPIUM BROMIDE MONOHYDRATE 18 MCG IN CAPS: 18.0000 ug | ORAL_CAPSULE | Freq: Every day | RESPIRATORY_TRACT | 12 refills | Status: DC

## 2016-07-21 MED ORDER — ESCITALOPRAM OXALATE 20 MG PO TABS: 20.0000 mg | ORAL_TABLET | Freq: Every day | ORAL | 5 refills | Status: DC

## 2016-07-21 MED ORDER — EMPAGLIFLOZIN 10 MG PO TABS: 10.0000 mg | ORAL_TABLET | Freq: Every day | ORAL | 1 refills | Status: DC

## 2016-07-21 NOTE — Progress Notes (Signed)
Subjective:    Patient ID: Sherry Glass, female    DOB: [birth year], 46 y.o.   MRN: [id number]  Pt present to the office today to recheck GAD. PT was started on Lexapro 10 mg and she states she can tell no difference. She states her breathing is worse and complaining of SOB with exertion. Chest x-ray on 05/13/16 showed Cardiomegaly.  Anxiety  Presents for follow-up visit. Symptoms include excessive worry, irritability, nervous/anxious behavior and shortness of breath. Patient reports no suicidal ideas. Symptoms occur constantly. The severity of symptoms is moderate.    Diabetes  She presents for her follow-up diabetic visit. She has type 2 diabetes mellitus. Her disease course has been worsening. Hypoglycemia symptoms include nervousness/anxiousness. Associated symptoms include blurred vision and visual change. Pertinent negatives for diabetes include no foot paresthesias. Diabetic complications include peripheral neuropathy. Pertinent negatives for diabetic complications include no CVA or heart disease. Risk factors for coronary artery disease include diabetes mellitus, obesity and post-menopausal. Current diabetic treatment includes oral agent (dual therapy). She is compliant with treatment all of the time. She is following a generally unhealthy diet. Her breakfast blood glucose range is generally 180-200 mg/dl. An ACE inhibitor/angiotensin II receptor blocker is being taken.  COPD Pt smoking 1/2 pack a day. States her breathing worse. States she is having to use her nebulizer every night.     Review of Systems  Constitutional: Positive for irritability.  Eyes: Positive for blurred vision.  Respiratory: Positive for cough and shortness of breath. Negative for wheezing.   Cardiovascular: Negative for leg swelling.  Psychiatric/Behavioral: Negative for suicidal ideas. The patient is nervous/anxious.   All other systems reviewed and are negative.      Objective:   Physical Exam    Constitutional: She is oriented to person, place, and time. She appears well-developed and well-nourished. No distress.  HENT:  Head: Normocephalic and atraumatic.  Right Ear: External ear normal.  Mouth/Throat: Oropharynx is clear and moist.  Eyes: Pupils are equal, round, and reactive to light.  Neck: Normal range of motion. Neck supple. No thyromegaly present.  Cardiovascular: Normal rate, regular rhythm, normal heart sounds and intact distal pulses.   No murmur heard. Pulmonary/Chest: Effort normal. No respiratory distress. She has wheezes.  Abdominal: Soft. Bowel sounds are normal. She exhibits no distension. There is no tenderness.  Musculoskeletal: Normal range of motion. She exhibits no edema or tenderness.  Neurological: She is alert and oriented to person, place, and time. She has normal reflexes. No cranial nerve deficit.  Skin: Skin is warm and dry.  Psychiatric: She has a normal mood and affect. Her behavior is normal. Judgment and thought content normal.  Vitals reviewed.     BP 117/69   Pulse 78   Temp (!) 96.9 F (36.1 C) (Oral)   Ht '5\' 3"'  (1.6 m)   Wt 208 lb 6.4 oz (94.5 kg)   BMI 36.92 kg/m      Assessment & Plan:  1. Cardiomegaly - Ambulatory referral to Cardiology - CMP14+EGFR  2. SOB (shortness of breath) - Ambulatory referral to Cardiology - CMP14+EGFR  3. Chronic obstructive pulmonary disease, unspecified COPD type ([place]) -Added Spiriva today -Smoking cessation discussed - tiotropium (SPIRIVA HANDIHALER) 18 MCG inhalation capsule; Place 1 capsule (18 mcg total) into inhaler and inhale daily.  Dispense: 30 capsule; Refill: 12 - CMP14+EGFR  4. Uncontrolled type 2 diabetes mellitus with hyperglycemia, without long-term current use of insulin (HCC) -Added Jardiance today  - empagliflozin (JARDIANCE) 10 MG  TABS tablet; Take 10 mg by mouth daily.  Dispense: 90 tablet; Refill: 1 - CMP14+EGFR  5. Current smoker -Smoking cessation discussed -  CMP14+EGFR  6. GAD (generalized anxiety disorder) -Lexapro increased to 20 mg from 10 mg - escitalopram (LEXAPRO) 20 MG tablet; Take 1 tablet (20 mg total) by mouth daily.  Dispense: 30 tablet; Refill: 5 - CMP14+EGFR   Continue all meds Labs pending Health Maintenance reviewed Diet and exercise encouraged RTO 6 weeks to recheck GAD and SOB  Evelina Dun, [name]

## 2016-07-21 NOTE — Patient Instructions (Signed)
Heart Failure °Heart failure is a condition in which the heart has trouble pumping blood because it has become weak or stiff. This means that the heart does not pump blood efficiently for the body to work well. For some people with heart failure, fluid may back up into the lungs and there may be swelling (edema) in the lower legs. Heart failure is usually a long-term (chronic) condition. It is important for you to take good care of yourself and follow the treatment plan from your health care provider. °What are the causes? °This condition is caused by some health problems, including: °· High blood pressure (hypertension). Hypertension causes the heart muscle to work harder than normal. High blood pressure eventually causes the heart to become stiff and weak. °· Coronary artery disease (CAD). CAD is the buildup of cholesterol and fat (plaques) in the arteries of the heart. °· Heart attack (myocardial infarction). Injured tissue, which is caused by the heart attack, does not contract as well and the heart's ability to pump blood is weakened. °· Abnormal heart valves. When the heart valves do not open and close properly, the heart muscle must pump harder to keep the blood flowing. °· Heart muscle disease (cardiomyopathy or myocarditis). Heart muscle disease is damage to the heart muscle from a variety of causes, such as drug or alcohol abuse, infections, or unknown causes. These can increase the risk of heart failure. °· Lung disease. When the lungs do not work properly, the heart must work harder. ° °What increases the risk? °Risk of heart failure increases as a person ages. This condition is also more likely to develop in people who: °· Are overweight. °· Are female. °· Smoke or chew tobacco. °· Abuse alcohol or illegal drugs. °· Have taken medicines that can damage the heart, such as chemotherapy drugs. °· Have diabetes. °? High blood sugar (glucose) is associated with high fat (lipid) levels in the blood. °? Diabetes  can also damage tiny blood vessels that carry nutrients to the heart muscle. °· Have abnormal heart rhythms. °· Have thyroid problems. °· Have low blood counts (anemia). ° °What are the signs or symptoms? °Symptoms of this condition include: °· Shortness of breath with activity, such as when climbing stairs. °· Persistent cough. °· Swelling of the feet, ankles, legs, or abdomen. °· Unexplained weight gain. °· Difficulty breathing when lying flat (orthopnea). °· Waking from sleep because of the need to sit up and get more air. °· Rapid heartbeat. °· Fatigue and loss of energy. °· Feeling light-headed, dizzy, or close to fainting. °· Loss of appetite. °· Nausea. °· Increased urination during the night (nocturia). °· Confusion. ° °How is this diagnosed? °This condition is diagnosed based on: °· Medical history, symptoms, and a physical exam. °· Diagnostic tests, which may include: °? Echocardiogram. °? Electrocardiogram (ECG). °? Chest X-ray. °? Blood tests. °? Exercise stress test. °? Radionuclide scans. °? Cardiac catheterization and angiogram. ° °How is this treated? °Treatment for this condition is aimed at managing the symptoms of heart failure. Medicines, behavioral changes, or other treatments may be necessary to treat heart failure. °Medicines °These may include: °· Angiotensin-converting enzyme (ACE) inhibitors. This type of medicine blocks the effects of a blood protein called angiotensin-converting enzyme. ACE inhibitors relax (dilate) the blood vessels and help to lower blood pressure. °· Angiotensin receptor blockers (ARBs). This type of medicine blocks the actions of a blood protein called angiotensin. ARBs dilate the blood vessels and help to lower blood pressure. °· Water   pills (diuretics). Diuretics cause the kidneys to remove salt and water from the blood. The extra fluid is removed through urination, leaving a lower volume of blood that the heart has to pump. °· Beta blockers. These improve heart  muscle strength and they prevent the heart from beating too quickly. °· Digoxin. This increases the force of the heartbeat. ° °Healthy behavior changes °These may include: °· Reaching and maintaining a healthy weight. °· Stopping smoking or chewing tobacco. °· Eating heart-healthy foods. °· Limiting or avoiding alcohol. °· Stopping use of street drugs (illegal drugs). °· Physical activity. ° °Other treatments °These may include: °· Surgery to open blocked coronary arteries or repair damaged heart valves. °· Placement of a biventricular pacemaker to improve heart muscle function (cardiac resynchronization therapy). This device paces both the right ventricle and left ventricle. °· Placement of a device to treat serious abnormal heart rhythms (implantable cardioverter defibrillator, or ICD). °· Placement of a device to improve the pumping ability of the heart (left ventricular assist device, or LVAD). °· Heart transplant. This can cure heart failure, and it is considered for certain patients who do not improve with other therapies. ° °Follow these instructions at home: °Medicines °· Take over-the-counter and prescription medicines only as told by your health care provider. Medicines are important in reducing the workload of your heart, slowing the progression of heart failure, and improving your symptoms. °? Do not stop taking your medicine unless your health care provider told you to do that. °? Do not skip any dose of medicine. °? Refill your prescriptions before you run out of medicine. You need your medicines every day. °Eating and drinking ° °· Eat heart-healthy foods. Talk with a dietitian to make an eating plan that is right for you. °? Choose foods that contain no trans fat and are low in saturated fat and cholesterol. Healthy choices include fresh or frozen fruits and vegetables, fish, lean meats, legumes, fat-free or low-fat dairy products, and whole-grain or high-fiber foods. °? Limit salt (sodium) if  directed by your health care provider. Sodium restriction may reduce symptoms of heart failure. Ask a dietitian to recommend heart-healthy seasonings. °? Use healthy cooking methods instead of frying. Healthy methods include roasting, grilling, broiling, baking, poaching, steaming, and stir-frying. °· Limit your fluid intake if directed by your health care provider. Fluid restriction may reduce symptoms of heart failure. °Lifestyle °· Stop smoking or using chewing tobacco. Nicotine and tobacco can damage your heart and your blood vessels. Do not use nicotine gum or patches before talking to your health care provider. °· Limit alcohol intake to no more than 1 drink per day for non-pregnant women and 2 drinks per day for men. One drink equals 12 oz of beer, 5 oz of wine, or 1½ oz of hard liquor. °? Drinking more than that is harmful to your heart. Tell your health care provider if you drink alcohol several times a week. °? Talk with your health care provider about whether any level of alcohol use is safe for you. °? If your heart has already been damaged by alcohol or you have severe heart failure, drinking alcohol should be stopped completely. °· Stop use of illegal drugs. °· Lose weight if directed by your health care provider. Weight loss may reduce symptoms of heart failure. °· Do moderate physical activity if directed by your health care provider. People who are elderly and people with severe heart failure should consult with a health care provider for physical activity recommendations. °  Monitor important information °· Weigh yourself every day. Keeping track of your weight daily helps you to notice excess fluid sooner. °? Weigh yourself every morning after you urinate and before you eat breakfast. °? Wear the same amount of clothing each time you weigh yourself. °? Record your daily weight. Provide your health care provider with your weight record. °· Monitor and record your blood pressure as told by your health  care provider. °· Check your pulse as told by your health care provider. °Dealing with extreme temperatures °· If the weather is extremely hot: °? Avoid vigorous physical activity. °? Use air conditioning or fans or seek a cooler location. °? Avoid caffeine and alcohol. °? Wear loose-fitting, lightweight, and light-colored clothing. °· If the weather is extremely cold: °? Avoid vigorous physical activity. °? Layer your clothes. °? Wear mittens or gloves, a hat, and a scarf when you go outside. °? Avoid alcohol. °General instructions °· Manage other health conditions such as hypertension, diabetes, thyroid disease, or abnormal heart rhythms as told by your health care provider. °· Learn to manage stress. If you need help to do this, ask your health care provider. °· Plan rest periods when fatigued. °· Get ongoing education and support as needed. °· Participate in or seek rehabilitation as needed to maintain or improve independence and quality of life. °· Stay up to date with immunizations. Keeping current on pneumococcal and influenza immunizations is especially important to prevent respiratory infections. °· Keep all follow-up visits as told by your health care provider. This is important. °Contact a health care provider if: °· You have a rapid weight gain. °· You have increasing shortness of breath that is unusual for you. °· You are unable to participate in your usual physical activities. °· You tire easily. °· You cough more than normal, especially with physical activity. °· You have any swelling or more swelling in areas such as your hands, feet, ankles, or abdomen. °· You are unable to sleep because it is hard to breathe. °· You feel like your heart is beating quickly (palpitations). °· You become dizzy or light-headed when you stand up. °Get help right away if: °· You have difficulty breathing. °· You notice or your family notices a change in your awareness, such as having trouble staying awake or having  difficulty with concentration. °· You have pain or discomfort in your chest. °· You have an episode of fainting (syncope). °This information is not intended to replace advice given to you by your health care provider. Make sure you discuss any questions you have with your health care provider. °Document Released: 04/18/2005 Document Revised: 12/22/2015 Document Reviewed: 11/11/2015 °Elsevier Interactive Patient Education © 2017 [employer] ° °

## 2016-07-22 ENCOUNTER — Encounter: Payer: Self-pay | Admitting: "Endocrinology

## 2016-07-22 ENCOUNTER — Ambulatory Visit (INDEPENDENT_AMBULATORY_CARE_PROVIDER_SITE_OTHER): Payer: Medicaid Other | Admitting: "Endocrinology

## 2016-07-22 VITALS — BP 130/82 | HR 71 | Ht 63.0 in | Wt 205.0 lb

## 2016-07-22 DIAGNOSIS — E0590 Thyrotoxicosis, unspecified without thyrotoxic crisis or storm: Secondary | ICD-10-CM | POA: Diagnosis not present

## 2016-07-22 LAB — CMP14+EGFR
ALT: 23 IU/L (ref 0–32)
AST: 16 IU/L (ref 0–40)
Albumin/Globulin Ratio: 1.3 (ref 1.2–2.2)
Albumin: 4.2 g/dL (ref 3.5–5.5)
Alkaline Phosphatase: 84 IU/L (ref 39–117)
BUN/Creatinine Ratio: 20 (ref 9–23)
BUN: 11 mg/dL (ref 6–24)
Bilirubin Total: 0.5 mg/dL (ref 0.0–1.2)
CO2: 24 mmol/L (ref 18–29)
Calcium: 10 mg/dL (ref 8.7–10.2)
Chloride: 95 mmol/L — ABNORMAL LOW (ref 96–106)
Creatinine, Ser: 0.55 mg/dL — ABNORMAL LOW (ref 0.57–1.00)
GFR calc Af Amer: 129 mL/min/{1.73_m2} (ref >59)
GFR calc non Af Amer: 112 mL/min/{1.73_m2} (ref >59)
Globulin, Total: 3.2 g/dL (ref 1.5–4.5)
Glucose: 276 mg/dL — ABNORMAL HIGH (ref 65–99)
Potassium: 5.2 mmol/L (ref 3.5–5.2)
Sodium: 136 mmol/L (ref 134–144)
Total Protein: 7.4 g/dL (ref 6.0–8.5)

## 2016-07-22 MED ORDER — PROPRANOLOL HCL 20 MG PO TABS: 20.0000 mg | ORAL_TABLET | Freq: Two times a day (BID) | ORAL | 0 refills | Status: DC

## 2016-07-22 NOTE — Progress Notes (Signed)
Subjective:    Patient ID: Sherry Glass, female    DOB: 1970-07-20, PCP Evelina Dun, [name].   Past Medical History:  Diagnosis Date  . Anxiety and depression   . Asthma   . Cataract   . Chest pain syndrome   . Depression   . Diabetes mellitus without complication ([place])   . History of kidney stones   . HTN (hypertension)   . Hyperglycemia   . Hypertension   . Obesity   . Obesity   . Tobacco abuse    Past Surgical History:  Procedure Laterality Date  . ABDOMINAL HYSTERECTOMY    . CARDIAC CATHETERIZATION  2014  . CATARACT EXTRACTION W/PHACO Left 06/23/2016   Procedure: CATARACT EXTRACTION PHACO AND INTRAOCULAR LENS PLACEMENT (IOC);  Surgeon: Tonny Branch, MD;  Location: [facility];  Service: Ophthalmology;  Laterality: Left;  left - pt knows to arrive at 10:50 cde 9.11  . CESAREAN SECTION    . CHOLECYSTECTOMY    . EXCISION MASS NECK     cyst  . FOOT MASS EXCISION    . hysteectomy --- unknown    . left foot surgery     removal of cyst  . NECK SURGERY     Social History   Social History  . Marital status: Widowed    Spouse name: N/A  . Number of children: N/A  . Years of education: N/A   Social History Main Topics  . Smoking status: Current Every Day Smoker    Packs/day: 0.25    Years: 20.00  . Smokeless tobacco: Never Used  . Alcohol use Yes     Comment: For special occasions  . Drug use: No  . Sexual activity: No   Other Topics Concern  . None   Social History Narrative   ** Merged History Encounter **       Outpatient Encounter Prescriptions as of 07/22/2016  Medication Sig  . ACCU-CHEK FASTCLIX LANCETS MISC Use to check BG twice a day  . budesonide-formoterol (SYMBICORT) 80-4.5 MCG/ACT inhaler Inhale 2 puffs into the lungs 2 (two) times daily.  Marland Kitchen CINNAMON PO Take 1,000 mg by mouth 2 (two) times daily.  . cyclobenzaprine (FLEXERIL) 10 MG tablet Take 1 Tablet by mouth 3 times a day as needed FOR MUSCLE SPASMS  . empagliflozin (JARDIANCE) 10 MG TABS  tablet Take 10 mg by mouth daily.  Marland Kitchen escitalopram (LEXAPRO) 20 MG tablet Take 1 tablet (20 mg total) by mouth daily.  Marland Kitchen glucose blood test strip Use to check BG twice a day  . lisinopril (PRINIVIL,ZESTRIL) 10 MG tablet Take 1 tablet (10 mg total) by mouth daily. For kidney protection and blood pressure  . nitroGLYCERIN (NITROSTAT) 0.4 MG SL tablet Place 1 tablet (0.4 mg total) under the tongue every 5 (five) minutes as needed for chest pain.  . pravastatin (PRAVACHOL) 20 MG tablet Take 1 tablet (20 mg total) by mouth daily.  . propranolol (INDERAL) 20 MG tablet Take 1 tablet (20 mg total) by mouth 2 (two) times daily.  . SitaGLIPtin-MetFORMIN HCl (JANUMET XR) [phone] MG TB24 Take 1 tablet by mouth daily.  Marland Kitchen tiotropium (SPIRIVA HANDIHALER) 18 MCG inhalation capsule Place 1 capsule (18 mcg total) into inhaler and inhale daily.  . Vitamin D, Ergocalciferol, (DRISDOL) 50000 units CAPS capsule Take 1 capsule (50,000 Units total) by mouth every 7 (seven) days.  . [DISCONTINUED] propranolol (INDERAL) 20 MG tablet Take 1 tablet (20 mg total) by mouth 3 (three) times daily.  No facility-administered encounter medications on file as of 07/22/2016.    ALLERGIES: Allergies  Allergen Reactions  . Dilaudid [Hydromorphone]     Itching and vomitting  . Hydromorphone   . Valproic Acid And Related    VACCINATION STATUS:  There is no immunization history on file for this patient.  HPI  Sherry Glass presents today with Her thyroid uptake and scan for suspected hyperthyroidism.  - She was initiated on low-dose propranolol. She feels better. She has no new complaints.   - She underwent thyroid function tests on 2 separate occasions and showed significantly suppressed TSH 2 less than 0.006 associated with total T4 of 10.2, free thyroxine index of 3.3. The patient denies family history of thyroid dysfunction- she is adopted. - Patient is not on any anti-thyroid medications nor on any thyroid hormone  supplements. Patient  is willing to proceed with appropriate work up and therapy for thyrotoxicosis. - She has uncontrolled type 2 diabetes, upper lipidemia, hypertension on treatment. Her last A1c was 8.5% on 05/13/2016. - She is a chronic heavy smoker currently smoking one pack per day.   Review of Systems   Constitutional: + weight loss, no fatigue, no subjective hyperthermia, no subjective hypothermia Eyes: no blurry vision, no xerophthalmia ENT: no sore throat, no nodules palpated in throat, no dysphagia/odynophagia, no hoarseness Cardiovascular: no Chest Pain, no Shortness of Breath, + palpitations, no leg swelling Respiratory: no cough, no SOB Gastrointestinal: no Nausea/Vomiting/Diarhhea Musculoskeletal: no muscle/joint aches Skin: no rashes Neurological: - tremors, no numbness, no tingling, no dizziness Psychiatric: no depression, + anxiety  Objective:    BP 130/82   Pulse 71   Ht '5\' 3"'  (1.6 m)   Wt 205 lb (93 kg)   BMI 36.31 kg/m   Wt Readings from Last 3 Encounters:  07/22/16 205 lb (93 kg)  07/21/16 208 lb 6.4 oz (94.5 kg)  06/30/16 208 lb (94.3 kg)    Physical Exam Constitutional: Significantly over weight for hight, not in acute distress, anxious and fidgety. Eyes: PERRLA, EOMI, no exophthalmos ENT: moist mucous membranes, thyroid is palpable -mild thyromegaly, no cervical lymphadenopathy Cardiovascular: Tachycardia resolved, no Murmur/Rubs/Gallops Respiratory:  adequate breathing efforts, no gross chest deformity, Clear to auscultation bilaterally Gastrointestinal: abdomen soft, Non -tender, No distension, Bowel Sounds present Musculoskeletal: no gross deformities, strength intact in all four extremities Skin: moist, warm, no rashes Neurological: + tremor with outstretched hands, Deep tendon reflexes 3+  in all four extremities.  Results for orders placed or performed in visit on 07/21/16  CMP14+EGFR  Result Value Ref Range   Glucose 276 (H) 65 - 99 mg/dL    BUN 11 6 - 24 mg/dL   Creatinine, Ser 0.55 (L) 0.57 - 1.00 mg/dL   GFR calc non Af Amer 112 >59 mL/min/1.73   GFR calc Af Amer 129 >59 mL/min/1.73   BUN/Creatinine Ratio 20 9 - 23   Sodium 136 134 - 144 mmol/L   Potassium 5.2 3.5 - 5.2 mmol/L   Chloride 95 (L) 96 - 106 mmol/L   CO2 24 18 - 29 mmol/L   Calcium 10.0 8.7 - 10.2 mg/dL   Total Protein 7.4 6.0 - 8.5 g/dL   Albumin 4.2 3.5 - 5.5 g/dL   Globulin, Total 3.2 1.5 - 4.5 g/dL   Albumin/Globulin Ratio 1.3 1.2 - 2.2   Bilirubin Total 0.5 0.0 - 1.2 mg/dL   Alkaline Phosphatase 84 39 - 117 IU/L   AST 16 0 - 40 IU/L   ALT 23 0 -  32 IU/L   Complete Blood Count (Most recent): Lab Results  Component Value Date   WBC 10.4 06/16/2016   HGB 15.1 (H) 06/16/2016   HCT 45.2 06/16/2016   MCV 90.4 06/16/2016   PLT 259 06/16/2016   Chemistry (most recent): Lab Results  Component Value Date   NA 136 07/21/2016   K 5.2 07/21/2016   CL 95 (L) 07/21/2016   CO2 24 07/21/2016   BUN 11 07/21/2016   CREATININE 0.55 (L) 07/21/2016   Lipid Panel     Component Value Date/Time   CHOL 202 (H) 05/13/2016 1034   TRIG 248 (H) 05/13/2016 1034   HDL 50 05/13/2016 1034   CHOLHDL 4.0 05/13/2016 1034   LDLCALC 102 (H) 05/13/2016 1034   Results for ENVY, MENO (MRN [id number]) as of 06/30/2016 14:34  Ref. Range 05/13/2016 10:34 06/09/2016 09:21  TSH Latest Ref Range: 0.450 - 4.500 uIU/mL <0.006 (L) <0.006 (L)  Thyroxine (T4) Latest Ref Range: 4.5 - 12.0 ug/dL 9.8 10.2  Free Thyroxine Index Latest Ref Range: 1.2 - 4.9  3.2 3.3  T3 Uptake Ratio Latest Ref Range: 24 - 39 % 33 32    - Thyroid uptake and scan on 07/19/2016 was nonfocal with 25% uptake and 24 hours.  Assessment & Plan:   1. Subclinical Hyperthyroidism  - Her thyroid uptake and scan did not confirm hyperthyroidism. Her labs were consistent with subclinical hyperthyroidism. She is clinically Responding to low-dose beta blocker for symptoms. - I Deferred definitive treatment for  now. She will have repeat thyroid function test in 3 months with office visit. I advised her to lower propranolol to 20 mg by mouth twice a day. - She is extensively counseled against smoking.   - I advised patient to maintain close follow up with Evelina Dun, [name] for primary care needs, including diabetes care.  Follow up plan: Return in about 3 months (around 10/22/2016) for follow up with pre-visit labs.  Glade Lloyd, MD Phone: [phone]  Fax: [phone]   07/22/2016, 10:27 AM

## 2016-08-08 ENCOUNTER — Other Ambulatory Visit: Payer: Self-pay | Admitting: "Endocrinology

## 2016-08-08 ENCOUNTER — Other Ambulatory Visit: Payer: Self-pay | Admitting: Pharmacist

## 2016-08-12 ENCOUNTER — Encounter: Payer: Self-pay | Admitting: Interventional Cardiology

## 2016-08-12 ENCOUNTER — Ambulatory Visit (INDEPENDENT_AMBULATORY_CARE_PROVIDER_SITE_OTHER): Payer: Medicaid Other | Admitting: Interventional Cardiology

## 2016-08-12 VITALS — BP 128/78 | HR 64 | Resp 16 | Ht 63.0 in | Wt 199.1 lb

## 2016-08-12 DIAGNOSIS — I517 Cardiomegaly: Secondary | ICD-10-CM | POA: Diagnosis not present

## 2016-08-12 DIAGNOSIS — Z720 Tobacco use: Secondary | ICD-10-CM

## 2016-08-12 DIAGNOSIS — R0600 Dyspnea, unspecified: Secondary | ICD-10-CM

## 2016-08-12 DIAGNOSIS — R0609 Other forms of dyspnea: Secondary | ICD-10-CM

## 2016-08-12 DIAGNOSIS — R0789 Other chest pain: Secondary | ICD-10-CM

## 2016-08-12 NOTE — Patient Instructions (Signed)
Medication Instructions:  Your physician recommends that you continue on your current medications as directed. Please refer to the Current Medication list given to you today.   Labwork: None ordered.  Testing/Procedures: Your physician has requested that you have an echocardiogram in [place]. Echocardiography is a painless test that uses sound waves to create images of your heart. It provides your doctor with information about the size and shape of your heart and how well your heart's chambers and valves are working. This procedure takes approximately one hour. There are no restrictions for this procedure.    Follow-Up: To be determined based on echo results.  Any Other Special Instructions Will Be Listed Below (If Applicable).    Echocardiogram An echocardiogram, or echocardiography, uses sound waves (ultrasound) to produce an image of your heart. The echocardiogram is simple, painless, obtained within a short period of time, and offers valuable information to your health care provider. The images from an echocardiogram can provide information such as:  Evidence of coronary artery disease (CAD).  Heart size.  Heart muscle function.  Heart valve function.  Aneurysm detection.  Evidence of a past heart attack.  Fluid buildup around the heart.  Heart muscle thickening.  Assess heart valve function. Tell a health care provider about:  Any allergies you have.  All medicines you are taking, including vitamins, herbs, eye drops, creams, and over-the-counter medicines.  Any problems you or family members have had with anesthetic medicines.  Any blood disorders you have.  Any surgeries you have had.  Any medical conditions you have.  Whether you are pregnant or may be pregnant. What happens before the procedure? No special preparation is needed. Eat and drink normally. What happens during the procedure?  In order to produce an image of your heart, gel will be applied to  your chest and a wand-like tool (transducer) will be moved over your chest. The gel will help transmit the sound waves from the transducer. The sound waves will harmlessly bounce off your heart to allow the heart images to be captured in real-time motion. These images will then be recorded.  You may need an IV to receive a medicine that improves the quality of the pictures. What happens after the procedure? You may return to your normal schedule including diet, activities, and medicines, unless your health care provider tells you otherwise. This information is not intended to replace advice given to you by your health care provider. Make sure you discuss any questions you have with your health care provider. Document Released: 04/15/2000 Document Revised: 12/05/2015 Document Reviewed: 12/24/2012 Elsevier Interactive Patient Education  2017 [employer].  If you need a refill on your cardiac medications before your next appointment, please call your pharmacy.

## 2016-08-12 NOTE — Progress Notes (Signed)
Cardiology Office Note   Date:  08/12/2016   ID:  Sherry, Glass 03-Sep-1970, MRN [id number]  PCP:  Sherry Rodney, [name]    No chief complaint on file. cardiomegaly   Wt Readings from Last 3 Encounters:  08/12/16 199 lb 1.9 oz (90.3 kg)  07/22/16 205 lb (93 kg)  07/21/16 208 lb 6.4 oz (94.5 kg)       History of Present Illness: Sherry Glass is a 46 y.o. female  Who had a chest xray showing an enlarged heart in Jan 2018.  She has had chest pain for years.  She had a normal cath in 2014.  She has had multiple stress tests that were normal.    Sx from that time never went away.  She continues to smoke.  SHe has COPD.  She tried Wellbutrin wihtout success.    She is eating healthier.  SHe has lost 25 lbs. in 2018.  She continues to have DOE and some chest pain.  It is a sharp chest pain.  No relation to exertion.  THey can last minutes to hours.  No real change since her sx from a few years.    She does not know her family history.  Sherry Glass is a 46 y.o. female who is being seen today for the evaluation of cardiomegaly at the request of Sherry Spencer, [name].      Past Medical History:  Diagnosis Date  . Anxiety and depression   . Asthma   . Cataract   . Chest pain syndrome   . Depression   . Diabetes mellitus without complication (HCC)   . History of kidney stones   . HTN (hypertension)   . Hyperglycemia   . Hypertension   . Obesity   . Obesity   . Tobacco abuse     Past Surgical History:  Procedure Laterality Date  . ABDOMINAL HYSTERECTOMY    . CARDIAC CATHETERIZATION  2014  . CATARACT EXTRACTION W/PHACO Left 06/23/2016   Procedure: CATARACT EXTRACTION PHACO AND INTRAOCULAR LENS PLACEMENT (IOC);  Surgeon: Gemma Payor, MD;  Location: [facility];  Service: Ophthalmology;  Laterality: Left;  left - pt knows to arrive at 10:50 cde 9.11  . CESAREAN SECTION    . CHOLECYSTECTOMY    . EXCISION MASS NECK     cyst  . FOOT MASS EXCISION    . hysteectomy ---  unknown    . left foot surgery     removal of cyst  . NECK SURGERY       Current Outpatient Prescriptions  Medication Sig Dispense Refill  . ACCU-CHEK FASTCLIX LANCETS MISC Use to check BG twice a day 102 each 2  . budesonide-formoterol (SYMBICORT) 80-4.5 MCG/ACT inhaler Inhale 2 puffs into the lungs 2 (two) times daily. 1 Inhaler 3  . CINNAMON PO Take 1,000 mg by mouth 2 (two) times daily.    . cyclobenzaprine (FLEXERIL) 10 MG tablet Take 1 Tablet by mouth 3 times a day as needed FOR MUSCLE SPASMS 60 tablet 2  . empagliflozin (JARDIANCE) 10 MG TABS tablet Take 10 mg by mouth daily. 90 tablet 1  . escitalopram (LEXAPRO) 20 MG tablet Take 1 tablet (20 mg total) by mouth daily. 30 tablet 5  . glucose blood test strip Use to check BG twice a day 100 each 2  . lisinopril (PRINIVIL,ZESTRIL) 10 MG tablet Take 1 tablet (10 mg total) by mouth daily. For kidney protection and blood pressure 34 tablet 2  .  nitroGLYCERIN (NITROSTAT) 0.4 MG SL tablet Place 1 tablet (0.4 mg total) under the tongue every 5 (five) minutes as needed for chest pain. 25 tablet 3  . pravastatin (PRAVACHOL) 20 MG tablet Take 1 tablet (20 mg total) by mouth daily. 90 tablet 3  . propranolol (INDERAL) 20 MG tablet Take 1 tablet (20 mg total) by mouth 2 (two) times daily. 90 tablet 0  . SitaGLIPtin-MetFORMIN HCl (JANUMET XR) [phone] MG TB24 Take 1 tablet by mouth daily. 34 tablet 2  . tiotropium (SPIRIVA HANDIHALER) 18 MCG inhalation capsule Place 1 capsule (18 mcg total) into inhaler and inhale daily. 30 capsule 12  . Vitamin D, Ergocalciferol, (DRISDOL) 50000 units CAPS capsule Take 1 capsule (50,000 Units total) by mouth every 7 (seven) days. 12 capsule 3   No current facility-administered medications for this visit.     Allergies:   Dilaudid [hydromorphone]; Hydromorphone; and Valproic acid and related    Social History:  The patient  reports that she has been smoking.  She has a 5.00 pack-year smoking history. She has  never used smokeless tobacco. She reports that she drinks alcohol. She reports that she does not use drugs.   Family History:  The patient's She was adopted. Family history is unknown by patient.    ROS:  Please see the history of present illness.   Otherwise, review of systems are positive for chest pain, SHOB.   All other systems are reviewed and negative.    PHYSICAL EXAM: VS:  BP 128/78   Pulse 64   Resp 16   Ht  (1.6 m)   Wt 199 lb 1.9 oz (90.3 kg)   SpO2 98%   BMI 35.27 kg/m  , BMI Body mass index is 35.27 kg/m. GEN: Well nourished, well developed, in no acute distress  HEENT: normal  Neck: no JVD, carotid bruits, or masses Cardiac: RRR; no murmurs, rubs, or gallops,no edema  Respiratory:  clear to auscultation bilaterally, normal work of breathing GI: soft, nontender, nondistended, + BS MS: no deformity or atrophy , 2+ PT pulses bilaterally Skin: warm and dry, no rash Neuro:  Strength and sensation are intact Psych: euthymic mood, full affect   EKG:   The ekg ordered in 2/18 demonstrates : NSR, no ST changes   Recent Labs: 06/09/2016: TSH <0.006 06/16/2016: Hemoglobin 15.1; Platelets 259 07/21/2016: ALT 23; BUN 11; Creatinine, Ser 0.55; Potassium 5.2; Sodium 136   Lipid Panel    Component Value Date/Time   CHOL 202 (H) 05/13/2016 1034   TRIG 248 (H) 05/13/2016 1034   HDL 50 05/13/2016 1034   CHOLHDL 4.0 05/13/2016 1034   LDLCALC 102 (H) 05/13/2016 1034     Other studies Reviewed: Additional studies/ records that were reviewed today with results demonstrating: prior cath showed normal coronary arteries in 2014.   ASSESSMENT AND PLAN:  1. Chest pain: Several atypical features. Negative cath a few years ago. Multiple negative stress test. The chest pain has been persistent for years. No acute changes on ECG. Would not plan any ischemic testing at this time. We'll wait on echocardiogram result. 2. Cardiomegaly: Noted by chest x-ray. Check echocardiogram to  get a better sense of cardiac size. 3. Tobacco abuse: I strongly encouraged her to stop smoking. She is trying to cut back. She is not interested in trying any aid to help stop at this time. 4. Shortness of breath: Multifactorial. She needs to stop smoking. She does have COPD and uses inhalers.   Current medicines  are reviewed at length with the patient today.  The patient concerns regarding her medicines were addressed.  The following changes have been made:  No change  Labs/ tests ordered today include:  No orders of the defined types were placed in this encounter.   Recommend 150 minutes/week of aerobic exercise Low fat, low carb, high fiber diet recommended  Disposition:   FU based on echo result   Signed, Lance Muss, MD  08/12/2016 9:36 AM    [facility] [address] [place], [place], [place]  [address] Phone: [phone]; Fax: [phone]

## 2016-08-16 ENCOUNTER — Ambulatory Visit (INDEPENDENT_AMBULATORY_CARE_PROVIDER_SITE_OTHER): Payer: Medicaid Other

## 2016-08-16 ENCOUNTER — Other Ambulatory Visit: Payer: Self-pay

## 2016-08-16 DIAGNOSIS — I517 Cardiomegaly: Secondary | ICD-10-CM | POA: Diagnosis not present

## 2016-08-25 ENCOUNTER — Ambulatory Visit: Payer: Self-pay | Admitting: Pharmacist

## 2016-09-06 ENCOUNTER — Other Ambulatory Visit: Payer: Self-pay | Admitting: Pharmacist

## 2016-09-07 ENCOUNTER — Other Ambulatory Visit: Payer: Self-pay | Admitting: "Endocrinology

## 2016-09-07 ENCOUNTER — Encounter: Payer: Self-pay | Admitting: Family

## 2016-09-07 MED ORDER — SITAGLIP PHOS-METFORMIN HCL ER 100-1000 MG PO TB24: 1.0000 | ORAL_TABLET | Freq: Every day | ORAL | 2 refills | Status: DC

## 2016-09-07 MED ORDER — LISINOPRIL 10 MG PO TABS: 10.0000 mg | ORAL_TABLET | Freq: Every day | ORAL | 2 refills | Status: DC

## 2016-09-07 MED ORDER — PROPRANOLOL HCL 20 MG PO TABS: 20.0000 mg | ORAL_TABLET | Freq: Two times a day (BID) | ORAL | 2 refills | Status: DC

## 2016-09-07 NOTE — Telephone Encounter (Signed)
Patient notified via mychart that refills were sent.

## 2016-09-09 ENCOUNTER — Encounter: Payer: Self-pay | Admitting: Family

## 2016-09-09 ENCOUNTER — Ambulatory Visit (INDEPENDENT_AMBULATORY_CARE_PROVIDER_SITE_OTHER): Payer: Medicaid Other | Admitting: Family

## 2016-09-09 VITALS — BP 119/72 | HR 74 | Temp 97.6°F | Ht 63.0 in | Wt 194.8 lb

## 2016-09-09 DIAGNOSIS — J449 Chronic obstructive pulmonary disease, unspecified: Secondary | ICD-10-CM

## 2016-09-09 DIAGNOSIS — E1165 Type 2 diabetes mellitus with hyperglycemia: Secondary | ICD-10-CM

## 2016-09-09 DIAGNOSIS — F419 Anxiety disorder, unspecified: Secondary | ICD-10-CM

## 2016-09-09 DIAGNOSIS — F329 Major depressive disorder, single episode, unspecified: Secondary | ICD-10-CM

## 2016-09-09 DIAGNOSIS — F32A Depression, unspecified: Secondary | ICD-10-CM

## 2016-09-09 LAB — BAYER DCA HB A1C WAIVED: HB A1C (BAYER DCA - WAIVED): 6.5 % (ref <7.0)

## 2016-09-09 MED ORDER — VORTIOXETINE HBR 20 MG PO TABS: 20.0000 mg | ORAL_TABLET | Freq: Every day | ORAL | 1 refills | Status: DC

## 2016-09-09 MED ORDER — BUDESONIDE-FORMOTEROL FUMARATE 160-4.5 MCG/ACT IN AERO: 2.0000 | INHALATION_SPRAY | Freq: Two times a day (BID) | RESPIRATORY_TRACT | 12 refills | Status: DC

## 2016-09-09 MED ORDER — CETIRIZINE HCL 10 MG PO TABS: 10.0000 mg | ORAL_TABLET | Freq: Every day | ORAL | 1 refills | Status: DC

## 2016-09-09 MED ORDER — ASPIRIN EC 81 MG PO TBEC: 81.0000 mg | DELAYED_RELEASE_TABLET | Freq: Every day | ORAL | 1 refills | Status: DC

## 2016-09-09 NOTE — Patient Instructions (Signed)
Your provider wants you to schedule an appointment with a Psychologist/Psychiatrist. The following list of offices requires the patient to call and make their own appointment, as there is information they need that only you can provide. Please feel free to choose form the following providers:  [facility]   [phone] Crisis Recovery in [place] [phone]  [facility]  [phone]   [address] [place], [place]  (Scheduled through [facility]) Must call and do an interview for appointment. Sees Children / Accepts Medicaid  [facility]    [phone]  [address], [address]    [place], [place]       [facility]  [phone] [address] [place], [place]  Evaluates for Autism but does not treat it Sees Children / Accepts Medicaid  [facility]    [phone] [address]   [place], [place] Medication management, substance abuse, bipolar, grief, family, marriage, OCD, anxiety, PTSD Sees children / Accepts Medicaid  [facility]    [phone] [address] [place], [place] Sees children / Accepts Medicaid  [facility]  [phone] [address] [place], [place]   Dr Akinlayo     [phone] [address] [place], [place]  Sees ADD & ADHD for treatment Accepts Medicaid  [facility]  [phone] [address] [place], [place] Evaluates for Autism Accepts Medicaid  [facility]  [phone] [address] [place], [place]  Does Adult ADD evaluations Does not accept Medicaid  [facility]   [phone] [address]   , [place] Uses animal therapy  Sees children as young as 3 years old Accepts Medicaid  [facility]     [phone]    [address]  [place],  [address] Sees children Accepts Medicaid  

## 2016-09-09 NOTE — Progress Notes (Signed)
Subjective:    Patient ID: Sherry Glass, female    DOB: 11-10-1970, 46 y.o.   MRN: [id number]  Pt presents to the office today to recheck GAD and SOB. We increased her lexapo from 10 mg to 20 mg, added Spiriva, and added Jardiance.   Pt states she can not tell a difference in her depression or her breathing. Pt continues to smoke. Pt states her blood sugars have improved.  Diabetes  She presents for her follow-up diabetic visit. Hypoglycemia symptoms include nervousness/anxiousness. Associated symptoms include foot paresthesias. Pertinent negatives for diabetes include no blurred vision. Symptoms are worsening. Her breakfast blood glucose range is generally 110-130 mg/dl.  Depression         This is a chronic problem.  The current episode started more than 1 year ago.   The onset quality is gradual.   The problem occurs intermittently.  Associated symptoms include irritable, decreased interest and sad.  Associated symptoms include no decreased concentration, no helplessness and no hopelessness.     The symptoms are aggravated by work stress and family issues.  Past treatments include SSRIs - Selective serotonin reuptake inhibitors.     Review of Systems  Eyes: Negative for blurred vision.  Respiratory: Positive for cough and shortness of breath.   Psychiatric/Behavioral: Positive for depression. Negative for decreased concentration. The patient is nervous/anxious.   All other systems reviewed and are negative.      Objective:   Physical Exam  Constitutional: She is oriented to person, place, and time. She appears well-developed and well-nourished. She is irritable. No distress.  HENT:  Head: Normocephalic and atraumatic.  Eyes: Pupils are equal, round, and reactive to light.  Neck: Normal range of motion. Neck supple. No thyromegaly present.  Cardiovascular: Normal rate, regular rhythm, normal heart sounds and intact distal pulses.   No murmur heard. Pulmonary/Chest: Effort normal. No  respiratory distress. She has decreased breath sounds. She has no wheezes.  Intermittent coarse nonproductive cough   Abdominal: Soft. Bowel sounds are normal. She exhibits no distension. There is no tenderness.  Musculoskeletal: Normal range of motion. She exhibits no edema or tenderness.  Neurological: She is alert and oriented to person, place, and time.  Skin: Skin is warm and dry.  Psychiatric: She has a normal mood and affect. Her behavior is normal. Judgment and thought content normal.  Vitals reviewed.     BP 119/72   Pulse 74   Temp 97.6 F (36.4 C) (Oral)   Ht 5\' 3"  (1.6 m)   Wt 194 lb 12.8 oz (88.4 kg)   BMI 34.51 kg/m      Assessment & Plan:  1. Chronic obstructive pulmonary disease, unspecified COPD type (HCC) -Symbicort increased to 160 from 80 Smoking cessation discussed - budesonide-formoterol (SYMBICORT) 160-4.5 MCG/ACT inhaler; Inhale 2 puffs into the lungs 2 (two) times daily.  Dispense: 1 Inhaler; Refill: 12 - cetirizine (ZYRTEC ALLERGY) 10 MG tablet; Take 1 tablet (10 mg total) by mouth daily.  Dispense: 90 tablet; Refill: 1  2. Uncontrolled type 2 diabetes mellitus with hyperglycemia, without long-term current use of insulin (HCC) -Continue medications Low carb diet - Bayer DCA Hb A1c Waived  3. Anxiety and depression Will try Trintellix- Pt states she has tried Wellbutrin, Cymbalta, and lexapro with no success Will do referral to Psychiarty - vortioxetine HBr (TRINTELLIX) 20 MG TABS; Take 20 mg by mouth daily.  Dispense: 90 tablet; Refill: 1 - Ambulatory referral to Psychiatry   Continue all meds Labs  pending Health Maintenance reviewed Diet and exercise encouraged RTO 2 month   Jannifer Rodney, [name]

## 2016-09-12 ENCOUNTER — Telehealth: Payer: Self-pay | Admitting: *Deleted

## 2016-09-12 MED ORDER — ALBUTEROL SULFATE (2.5 MG/3ML) 0.083% IN NEBU: 2.5000 mg | INHALATION_SOLUTION | Freq: Four times a day (QID) | RESPIRATORY_TRACT | 1 refills | Status: DC | PRN

## 2016-09-12 NOTE — Telephone Encounter (Signed)
Nebulizer Prescription sent to pharmacy   

## 2016-09-14 ENCOUNTER — Telehealth: Payer: Self-pay

## 2016-09-14 ENCOUNTER — Telehealth (HOSPITAL_COMMUNITY): Payer: Self-pay | Admitting: *Deleted

## 2016-09-14 NOTE — Telephone Encounter (Signed)
left voice message regarding an appointment. 

## 2016-09-15 NOTE — Telephone Encounter (Signed)
Pt has already tried cymbalta, lexapro, and wellbutrin with no relief.

## 2016-09-15 NOTE — Telephone Encounter (Signed)
Please review

## 2016-10-03 ENCOUNTER — Encounter: Payer: Self-pay | Admitting: Family

## 2016-10-03 ENCOUNTER — Ambulatory Visit (INDEPENDENT_AMBULATORY_CARE_PROVIDER_SITE_OTHER): Payer: Medicaid Other | Admitting: Family

## 2016-10-03 VITALS — BP 107/67 | HR 78 | Temp 98.1°F | Ht 63.0 in | Wt 201.2 lb

## 2016-10-03 DIAGNOSIS — R0683 Snoring: Secondary | ICD-10-CM | POA: Diagnosis not present

## 2016-10-03 DIAGNOSIS — J449 Chronic obstructive pulmonary disease, unspecified: Secondary | ICD-10-CM

## 2016-10-03 DIAGNOSIS — R0602 Shortness of breath: Secondary | ICD-10-CM | POA: Diagnosis not present

## 2016-10-03 NOTE — Progress Notes (Signed)
   Subjective:    Patient ID: Sherry Glass, female    DOB: [birth year], 46 y.o.   MRN: [id number]  Pt presents to the office today with SOB that is worse at night. She states she can not walk further than 40 feet without becoming SOB. PT states she quit smoking about 1 1/2 weeks ago with no change in her breathing. PT is taking Symbicort BID and Spirvia daily with no relief. PT had a chest x-ray 05/13/16 that showed Cardiomegaly. Pt followed up with Cardiologists and was told she was good. PT denies any GERD symptoms, but snores. Pt report day time sleepiness and fatigue.  Shortness of Breath  This is a chronic problem.      Review of Systems  Respiratory: Positive for shortness of breath.   All other systems reviewed and are negative.      Objective:   Physical Exam  Constitutional: She is oriented to person, place, and time. She appears well-developed and well-nourished. No distress.  HENT:  Head: Normocephalic.  Eyes: Pupils are equal, round, and reactive to light.  Neck: Normal range of motion. Neck supple. No thyromegaly present.  Cardiovascular: Normal rate, regular rhythm, normal heart sounds and intact distal pulses.   No murmur heard. Pulmonary/Chest: Effort normal and breath sounds normal. No respiratory distress. She has no wheezes.  Abdominal: Soft. Bowel sounds are normal. She exhibits no distension. There is no tenderness.  Musculoskeletal: Normal range of motion. She exhibits no edema or tenderness.  Neurological: She is alert and oriented to person, place, and time.  Skin: Skin is warm and dry.  Psychiatric: She has a normal mood and affect. Her behavior is normal. Judgment and thought content normal.  Vitals reviewed.     BP 107/67   Pulse 78   Temp 98.1 F (36.7 C) (Oral)   Ht 5\' 3"  (1.6 m)   Wt 201 lb 3.2 oz (91.3 kg)   SpO2 98% Comment: Sitting with room air.  BMI 35.64 kg/m      Assessment & Plan:  1. Chronic obstructive pulmonary disease,  unspecified COPD type (HCC) - Ambulatory referral to Pulmonology  2. SOB (shortness of breath) - Ambulatory referral to Pulmonology  3. Snoring - Ambulatory referral to Pulmonology   Will do referral to Pulmonology- Pt would benefit from sleep study Continue Symbicort and Spiriva Keep up the great work on smoking cessation! Encouraged exercise  RTO prn and keep chronic follow up   Jannifer Rodneyhristy Addley Ballinger, [name]

## 2016-10-03 NOTE — Patient Instructions (Signed)

## 2016-10-07 ENCOUNTER — Telehealth (HOSPITAL_COMMUNITY): Payer: Self-pay | Admitting: *Deleted

## 2016-10-07 NOTE — Telephone Encounter (Signed)
phone call would not go through.

## 2016-10-10 ENCOUNTER — Telehealth (HOSPITAL_COMMUNITY): Payer: Self-pay | Admitting: *Deleted

## 2016-10-10 NOTE — Telephone Encounter (Signed)
third phone call to patient, call would not go through.

## 2016-10-10 NOTE — Telephone Encounter (Signed)
third phone call to patient, call would not go through. 

## 2016-10-18 NOTE — Progress Notes (Deleted)
Psychiatric Initial Adult Assessment   Patient Identification: Sherry Glass MRN:  [id number] Date of Evaluation:  10/18/2016 Referral Source: *** Chief Complaint:   Visit Diagnosis: No diagnosis found.  History of Present Illness:   Sherry Glass is a 46 year old female with depression, COPD, who is referred for depression.     Associated Signs/Symptoms: Depression Symptoms:  {DEPRESSION SYMPTOMS:20000} (Hypo) Manic Symptoms:  {BHH MANIC SYMPTOMS:22872} Anxiety Symptoms:  {BHH ANXIETY SYMPTOMS:22873} Psychotic Symptoms:  {BHH PSYCHOTIC SYMPTOMS:22874} PTSD Symptoms: {BHH PTSD SYMPTOMS:22875}  Past Psychiatric History:  Outpatient:  Psychiatry admission:  Previous suicide attempt:  Past trials of medication: cymbalta, lexapro, Wellbutrin, Trintellix, History of violence:   Previous Psychotropic Medications: {YES/NO:21197}  Substance Abuse History in the last 12 months:  {yes no:314532}  Consequences of Substance Abuse: {BHH CONSEQUENCES OF SUBSTANCE ABUSE:22880}  Past Medical History:  Past Medical History:  Diagnosis Date  . Anxiety and depression   . Asthma   . Cataract   . Chest pain syndrome   . Depression   . Diabetes mellitus without complication (HCC)   . History of kidney stones   . HTN (hypertension)   . Hyperglycemia   . Hypertension   . Obesity   . Obesity   . Tobacco abuse     Past Surgical History:  Procedure Laterality Date  . ABDOMINAL HYSTERECTOMY    . CARDIAC CATHETERIZATION  2014  . CATARACT EXTRACTION W/PHACO Left 06/23/2016   Procedure: CATARACT EXTRACTION PHACO AND INTRAOCULAR LENS PLACEMENT (IOC);  Surgeon: Gemma Payor, MD;  Location: [facility];  Service: Ophthalmology;  Laterality: Left;  left - pt knows to arrive at 10:50 cde 9.11  . CESAREAN SECTION    . CHOLECYSTECTOMY    . EXCISION MASS NECK     cyst  . FOOT MASS EXCISION    . hysteectomy --- unknown    . left foot surgery     removal of cyst  . NECK SURGERY      Family  Psychiatric History: ***  Family History:  Family History  Problem Relation Age of Onset  . Adopted: Yes  . Family history unknown: Yes    Social History:   Social History   Social History  . Marital status: Widowed    Spouse name: N/A  . Number of children: N/A  . Years of education: N/A   Social History Main Topics  . Smoking status: Former Smoker    Packs/day: 0.25    Years: 20.00    Quit date: 09/22/2016  . Smokeless tobacco: Never Used  . Alcohol use Yes     Comment: For special occasions  . Drug use: No  . Sexual activity: No   Other Topics Concern  . Not on file   Social History Narrative   ** Merged History Encounter **        Additional Social History: ***  Allergies:   Allergies  Allergen Reactions  . Dilaudid [Hydromorphone]     Itching and vomitting  . Hydromorphone   . Valproic Acid And Related     Metabolic Disorder Labs: No results found for: HGBA1C, MPG No results found for: PROLACTIN Lab Results  Component Value Date   CHOL 202 (H) 05/13/2016   TRIG 248 (H) 05/13/2016   HDL 50 05/13/2016   CHOLHDL 4.0 05/13/2016   LDLCALC 102 (H) 05/13/2016     Current Medications: Current Outpatient Prescriptions  Medication Sig Dispense Refill  . ACCU-CHEK FASTCLIX LANCETS MISC Use to check BG twice  a day 102 each 2  . albuterol (PROVENTIL) (2.5 MG/3ML) 0.083% nebulizer solution Take 3 mLs (2.5 mg total) by nebulization every 6 (six) hours as needed for wheezing or shortness of breath. 150 mL 1  . aspirin EC 81 MG tablet Take 1 tablet (81 mg total) by mouth daily. 90 tablet 1  . budesonide-formoterol (SYMBICORT) 160-4.5 MCG/ACT inhaler Inhale 2 puffs into the lungs 2 (two) times daily. 1 Inhaler 12  . cetirizine (ZYRTEC ALLERGY) 10 MG tablet Take 1 tablet (10 mg total) by mouth daily. 90 tablet 1  . CINNAMON PO Take 1,000 mg by mouth 2 (two) times daily.    . cyclobenzaprine (FLEXERIL) 10 MG tablet Take 1 Tablet by mouth 3 times a day as needed  FOR MUSCLE SPASMS 60 tablet 2  . empagliflozin (JARDIANCE) 10 MG TABS tablet Take 10 mg by mouth daily. 90 tablet 1  . escitalopram (LEXAPRO) 20 MG tablet Take 20 mg by mouth daily.  5  . glucose blood test strip Use to check BG twice a day 100 each 2  . lisinopril (PRINIVIL,ZESTRIL) 10 MG tablet Take 1 tablet (10 mg total) by mouth daily. For kidney protection and blood pressure 34 tablet 2  . nitroGLYCERIN (NITROSTAT) 0.4 MG SL tablet Place 1 tablet (0.4 mg total) under the tongue every 5 (five) minutes as needed for chest pain. 25 tablet 3  . pravastatin (PRAVACHOL) 20 MG tablet Take 1 tablet (20 mg total) by mouth daily. 90 tablet 3  . propranolol (INDERAL) 20 MG tablet Take 1 tablet (20 mg total) by mouth 2 (two) times daily. 60 tablet 2  . SitaGLIPtin-MetFORMIN HCl (JANUMET XR) [phone] MG TB24 Take 1 tablet by mouth daily. 34 tablet 2  . tiotropium (SPIRIVA HANDIHALER) 18 MCG inhalation capsule Place 1 capsule (18 mcg total) into inhaler and inhale daily. 30 capsule 12  . Vitamin D, Ergocalciferol, (DRISDOL) 50000 units CAPS capsule Take 1 capsule (50,000 Units total) by mouth every 7 (seven) days. 12 capsule 3  . vortioxetine HBr (TRINTELLIX) 20 MG TABS Take 20 mg by mouth daily. 90 tablet 1   No current facility-administered medications for this visit.     Neurologic: Headache: No Seizure: No Paresthesias:No  Musculoskeletal: Strength & Muscle Tone: within normal limits Gait & Station: normal Patient leans: N/A  Psychiatric Specialty Exam: ROS  There were no vitals taken for this visit.There is no height or weight on file to calculate BMI.  General Appearance: Fairly Groomed  Eye Contact:  Good  Speech:  Clear and Coherent  Volume:  Normal  Mood:  {BHH MOOD:22306}  Affect:  {Affect (PAA):22687}  Thought Process:  Coherent and Goal Directed  Orientation:  Full (Time, Place, and Person)  Thought Content:  Logical  Suicidal Thoughts:  {ST/HT (PAA):22692}  Homicidal  Thoughts:  {ST/HT (PAA):22692}  Memory:  Immediate;   Good Recent;   Good Remote;   Good  Judgement:  {Judgement (PAA):22694}  Insight:  {Insight (PAA):22695}  Psychomotor Activity:  Normal  Concentration:  Concentration: Good and Attention Span: Good  Recall:  Good  Fund of Knowledge:Good  Language: Good  Akathisia:  No  Handed:  Right  AIMS (if indicated):  N/A  Assets:  Communication Skills Desire for Improvement  ADL's:  Intact  Cognition: WNL  Sleep:  ***   Assessment  Plan  The patient demonstrates the following risk factors for suicide: Chronic risk factors for suicide include: {Chronic Risk Factors for NGEXBMW:41324401}. Acute risk factors for suicide include: {  Acute Risk Factors for ZOXWRUE:45409811}Suicide:30414012}. Protective factors for this patient include: {Protective Factors for Suicide BJYN:82956213}Risk:30414013}. Considering these factors, the overall suicide risk at this point appears to be {Desc; low/moderate/high:110033}. Patient {ACTION; IS/IS YQM:57846962}OT:21021397} appropriate for outpatient follow up.   Treatment Plan Summary: Plan as above   Neysa Hottereina Elsworth Ledin, MD 6/19/201811:08 AM

## 2016-10-21 ENCOUNTER — Ambulatory Visit (HOSPITAL_COMMUNITY): Payer: Medicaid Other | Admitting: Psychiatry

## 2016-10-22 ENCOUNTER — Other Ambulatory Visit: Payer: Self-pay | Admitting: Pharmacist

## 2016-10-22 ENCOUNTER — Other Ambulatory Visit: Payer: Self-pay | Admitting: Family

## 2016-11-07 ENCOUNTER — Other Ambulatory Visit: Payer: Self-pay | Admitting: Family

## 2016-11-08 ENCOUNTER — Other Ambulatory Visit: Payer: Self-pay | Admitting: Family

## 2016-11-11 ENCOUNTER — Ambulatory Visit (INDEPENDENT_AMBULATORY_CARE_PROVIDER_SITE_OTHER): Payer: Medicaid Other | Admitting: Family

## 2016-11-11 ENCOUNTER — Ambulatory Visit (INDEPENDENT_AMBULATORY_CARE_PROVIDER_SITE_OTHER): Payer: Medicaid Other

## 2016-11-11 ENCOUNTER — Encounter: Payer: Self-pay | Admitting: Family

## 2016-11-11 VITALS — BP 130/77 | HR 75 | Temp 97.7°F | Ht 63.0 in | Wt 200.4 lb

## 2016-11-11 DIAGNOSIS — E669 Obesity, unspecified: Secondary | ICD-10-CM

## 2016-11-11 DIAGNOSIS — E785 Hyperlipidemia, unspecified: Secondary | ICD-10-CM

## 2016-11-11 DIAGNOSIS — I10 Essential (primary) hypertension: Secondary | ICD-10-CM | POA: Diagnosis not present

## 2016-11-11 DIAGNOSIS — J449 Chronic obstructive pulmonary disease, unspecified: Secondary | ICD-10-CM

## 2016-11-11 DIAGNOSIS — R062 Wheezing: Secondary | ICD-10-CM | POA: Diagnosis not present

## 2016-11-11 DIAGNOSIS — F329 Major depressive disorder, single episode, unspecified: Secondary | ICD-10-CM

## 2016-11-11 DIAGNOSIS — E1165 Type 2 diabetes mellitus with hyperglycemia: Secondary | ICD-10-CM

## 2016-11-11 DIAGNOSIS — F419 Anxiety disorder, unspecified: Secondary | ICD-10-CM

## 2016-11-11 LAB — BAYER DCA HB A1C WAIVED: HB A1C (BAYER DCA - WAIVED): 7.1 % — ABNORMAL HIGH (ref <7.0)

## 2016-11-11 MED ORDER — PREDNISONE 20 MG PO TABS: ORAL_TABLET | ORAL | 0 refills | Status: DC

## 2016-11-11 MED ORDER — FLUTICASONE-UMECLIDIN-VILANT 100-62.5-25 MCG/INH IN AEPB: 1.0000 | INHALATION_SPRAY | Freq: Every day | RESPIRATORY_TRACT | 6 refills | Status: DC

## 2016-11-11 NOTE — Progress Notes (Signed)
Subjective:    Patient ID: Sherry Glass, female    DOB: [birth year], 46 y.o.   MRN: [id number]  PT presents to the office today for chronic follow up.  Diabetes  She presents for her follow-up diabetic visit. She has type 2 diabetes mellitus. Her disease course has been stable. Hypoglycemia symptoms include nervousness/anxiousness. Pertinent negatives for diabetes include no blurred vision, no foot paresthesias and no visual change. Symptoms are stable. Pertinent negatives for diabetic complications include no CVA or heart disease. She is following a generally healthy diet. Her breakfast blood glucose range is generally 140-180 mg/dl. Eye exam is current.  Hypertension  This is a chronic problem. The current episode started more than 1 year ago. The problem has been resolved since onset. The problem is controlled. Associated symptoms include anxiety and shortness of breath. Pertinent negatives include no blurred vision, malaise/fatigue or peripheral edema. The current treatment provides moderate improvement. There is no history of CAD/MI or CVA.  Anxiety  Presents for follow-up visit. Symptoms include excessive worry, irritability, nervous/anxious behavior and shortness of breath. Symptoms occur occasionally.    COPD PT stopped smoking 08/2016. Pt currently using Spiriva daily and Symbicort BID. PT states she continues to wheeze and nonproductive cough. Pt has pulmonologist's app 11/18/16.      Review of Systems  Constitutional: Positive for irritability. Negative for malaise/fatigue.  Eyes: Negative for blurred vision.  Respiratory: Positive for shortness of breath and wheezing.   Psychiatric/Behavioral: The patient is nervous/anxious.   All other systems reviewed and are negative.      Objective:   Physical Exam  Constitutional: She is oriented to person, place, and time. She appears well-developed and well-nourished. No distress.  HENT:  Head: Normocephalic and atraumatic.  Eyes:  Pupils are equal, round, and reactive to light.  Neck: Normal range of motion. Neck supple. No thyromegaly present.  Cardiovascular: Normal rate, regular rhythm, normal heart sounds and intact distal pulses.   No murmur heard. Pulmonary/Chest: Effort normal. No respiratory distress. She has no wheezes. She has rhonchi.  Abdominal: Soft. Bowel sounds are normal. She exhibits no distension. There is no tenderness.  Musculoskeletal: Normal range of motion. She exhibits no edema or tenderness.  Neurological: She is alert and oriented to person, place, and time. She has normal reflexes. No cranial nerve deficit.  Skin: Skin is warm and dry.  Psychiatric: She has a normal mood and affect. Her behavior is normal. Judgment and thought content normal.  Vitals reviewed.   BP 130/77   Pulse 75   Temp 97.7 F (36.5 C) (Oral)   Ht '5\' 3"'  (1.6 m)   Wt 200 lb 6.4 oz (90.9 kg)   BMI 35.50 kg/m      Assessment & Plan:  1. Essential hypertension  2. Chronic obstructive pulmonary disease, unspecified COPD type ([place]) PT started on Trelegy today, stop Spiriva and Symbicort  Continue smoking cessation!!! [place] [employer]!!! - Fluticasone-Umeclidin-Vilant (TRELEGY ELLIPTA) 100-62.5-25 MCG/INH AEPB; Inhale 1 puff into the lungs daily.  Dispense: 1 each; Refill: 6 - DG Chest 2 View; Future - predniSONE (DELTASONE) 20 MG tablet; Take 3 tabs daily for 1 week, then 2 tabs for 1 week, then 1 tab for one week  Dispense: 42 tablet; Refill: 0  3. Uncontrolled type 2 diabetes mellitus with hyperglycemia, without long-term current use of insulin (HCC) - Bayer DCA Hb A1c Waived - CMP14+EGFR  4. Obesity (BMI 30-39.9) - CMP14+EGFR  5. Hyperlipidemia, unspecified hyperlipidemia type - CMP14+EGFR - Lipid  panel  6. Anxiety and depression - CMP14+EGFR  7. Wheezing - Fluticasone-Umeclidin-Vilant (TRELEGY ELLIPTA) 100-62.5-25 MCG/INH AEPB; Inhale 1 puff into the lungs daily.  Dispense: 1 each; Refill: 6 - DG Chest 2  View; Future - predniSONE (DELTASONE) 20 MG tablet; Take 3 tabs daily for 1 week, then 2 tabs for 1 week, then 1 tab for one week  Dispense: 42 tablet; Refill: 0 - CMP14+EGFR   Continue all meds Labs pending Health Maintenance reviewed Diet and exercise encouraged RTO 3 months   Evelina Dun, [name]

## 2016-11-11 NOTE — Patient Instructions (Signed)
Chronic Obstructive Pulmonary Disease Chronic obstructive pulmonary disease (COPD) is a common lung condition in which airflow from the lungs is limited. COPD is a general term that can be used to describe many different lung problems that limit airflow, including both chronic bronchitis and emphysema. If you have COPD, your lung function will probably never return to normal, but there are measures you can take to improve lung function and make yourself feel better. What are the causes?  Smoking (common).  Exposure to secondhand smoke.  Genetic problems.  Chronic inflammatory lung diseases or recurrent infections. What are the signs or symptoms?  Shortness of breath, especially with physical activity.  Deep, persistent (chronic) cough with a large amount of thick mucus.  Wheezing.  Rapid breaths (tachypnea).  Gray or bluish discoloration (cyanosis) of the skin, especially in your fingers, toes, or lips.  Fatigue.  Weight loss.  Frequent infections or episodes when breathing symptoms become much worse (exacerbations).  Chest tightness. How is this diagnosed? Your health care provider will take a medical history and perform a physical examination to diagnose COPD. Additional tests for COPD may include:  Lung (pulmonary) function tests.  Chest X-ray.  CT scan.  Blood tests. How is this treated? Treatment for COPD may include:  Inhaler and nebulizer medicines. These help manage the symptoms of COPD and make your breathing more comfortable.  Supplemental oxygen. Supplemental oxygen is only helpful if you have a low oxygen level in your blood.  Exercise and physical activity. These are beneficial for nearly all people with COPD.  Lung surgery or transplant.  Nutrition therapy to gain weight, if you are underweight.  Pulmonary rehabilitation. This may involve working with a team of health care providers and specialists, such as respiratory, occupational, and physical  therapists. Follow these instructions at home:  Take all medicines (inhaled or pills) as directed by your health care provider.  Avoid over-the-counter medicines or cough syrups that dry up your airway (such as antihistamines) and slow down the elimination of secretions unless instructed otherwise by your health care provider.  If you are a smoker, the most important thing that you can do is stop smoking. Continuing to smoke will cause further lung damage and breathing trouble. Ask your health care provider for help with quitting smoking. He or she can direct you to community resources or hospitals that provide support.  Avoid exposure to irritants such as smoke, chemicals, and fumes that aggravate your breathing.  Use oxygen therapy and pulmonary rehabilitation if directed by your health care provider. If you require home oxygen therapy, ask your health care provider whether you should purchase a pulse oximeter to measure your oxygen level at home.  Avoid contact with individuals who have a contagious illness.  Avoid extreme temperature and humidity changes.  Eat healthy foods. Eating smaller, more frequent meals and resting before meals may help you maintain your strength.  Stay active, but balance activity with periods of rest. Exercise and physical activity will help you maintain your ability to do things you want to do.  Preventing infection and hospitalization is very important when you have COPD. Make sure to receive all the vaccines your health care provider recommends, especially the pneumococcal and influenza vaccines. Ask your health care provider whether you need a pneumonia vaccine.  Learn and use relaxation techniques to manage stress.  Learn and use controlled breathing techniques as directed by your health care provider. Controlled breathing techniques include: 1. Pursed lip breathing. Start by breathing in (inhaling)   through your nose for 1 second. Then, purse your lips as  if you were going to whistle and breathe out (exhale) through the pursed lips for 2 seconds. 2. Diaphragmatic breathing. Start by putting one hand on your abdomen just above your waist. Inhale slowly through your nose. The hand on your abdomen should move out. Then purse your lips and exhale slowly. You should be able to feel the hand on your abdomen moving in as you exhale.  Learn and use controlled coughing to clear mucus from your lungs. Controlled coughing is a series of short, progressive coughs. The steps of controlled coughing are: 1. Lean your head slightly forward. 2. Breathe in deeply using diaphragmatic breathing. 3. Try to hold your breath for 3 seconds. 4. Keep your mouth slightly open while coughing twice. 5. Spit any mucus out into a tissue. 6. Rest and repeat the steps once or twice as needed. Contact a health care provider if:  You are coughing up more mucus than usual.  There is a change in the color or thickness of your mucus.  Your breathing is more labored than usual.  Your breathing is faster than usual. Get help right away if:  You have shortness of breath while you are resting.  You have shortness of breath that prevents you from:  Being able to talk.  Performing your usual physical activities.  You have chest pain lasting longer than 5 minutes.  Your skin color is more cyanotic than usual.  You measure low oxygen saturations for longer than 5 minutes with a pulse oximeter. This information is not intended to replace advice given to you by your health care provider. Make sure you discuss any questions you have with your health care provider. Document Released: 01/26/2005 Document Revised: 09/24/2015 Document Reviewed: 12/13/2012 Elsevier Interactive Patient Education  2017 Elsevier Inc.  

## 2016-11-12 LAB — CMP14+EGFR
ALT: 22 IU/L (ref 0–32)
AST: 17 IU/L (ref 0–40)
Albumin/Globulin Ratio: 1.4 (ref 1.2–2.2)
Albumin: 4.2 g/dL (ref 3.5–5.5)
Alkaline Phosphatase: 85 IU/L (ref 39–117)
BUN/Creatinine Ratio: 21 (ref 9–23)
BUN: 11 mg/dL (ref 6–24)
Bilirubin Total: 0.7 mg/dL (ref 0.0–1.2)
CO2: 22 mmol/L (ref 20–29)
Calcium: 10 mg/dL (ref 8.7–10.2)
Chloride: 103 mmol/L (ref 96–106)
Creatinine, Ser: 0.52 mg/dL — ABNORMAL LOW (ref 0.57–1.00)
GFR calc Af Amer: 132 mL/min/{1.73_m2} (ref >59)
GFR calc non Af Amer: 114 mL/min/{1.73_m2} (ref >59)
Globulin, Total: 3.1 g/dL (ref 1.5–4.5)
Glucose: 132 mg/dL — ABNORMAL HIGH (ref 65–99)
Potassium: 4.7 mmol/L (ref 3.5–5.2)
Sodium: 139 mmol/L (ref 134–144)
Total Protein: 7.3 g/dL (ref 6.0–8.5)

## 2016-11-12 LAB — LIPID PANEL
Chol/HDL Ratio: 3.3 ratio (ref 0.0–4.4)
Cholesterol, Total: 156 mg/dL (ref 100–199)
HDL: 47 mg/dL (ref >39)
LDL Calculated: 74 mg/dL (ref 0–99)
Triglycerides: 177 mg/dL — ABNORMAL HIGH (ref 0–149)
VLDL Cholesterol Cal: 35 mg/dL (ref 5–40)

## 2016-11-18 ENCOUNTER — Encounter: Payer: Self-pay | Admitting: Pulmonary Disease

## 2016-11-18 ENCOUNTER — Other Ambulatory Visit: Payer: Self-pay | Admitting: Pulmonary Disease

## 2016-11-18 ENCOUNTER — Other Ambulatory Visit: Payer: Medicaid Other

## 2016-11-18 ENCOUNTER — Ambulatory Visit (INDEPENDENT_AMBULATORY_CARE_PROVIDER_SITE_OTHER): Payer: Medicaid Other | Admitting: Pulmonary Disease

## 2016-11-18 VITALS — BP 124/70 | HR 72 | Ht 63.0 in | Wt 199.0 lb

## 2016-11-18 DIAGNOSIS — R059 Cough, unspecified: Secondary | ICD-10-CM

## 2016-11-18 DIAGNOSIS — R0600 Dyspnea, unspecified: Secondary | ICD-10-CM

## 2016-11-18 DIAGNOSIS — M797 Fibromyalgia: Secondary | ICD-10-CM | POA: Insufficient documentation

## 2016-11-18 DIAGNOSIS — R05 Cough: Secondary | ICD-10-CM | POA: Diagnosis not present

## 2016-11-18 DIAGNOSIS — J302 Other seasonal allergic rhinitis: Secondary | ICD-10-CM

## 2016-11-18 DIAGNOSIS — K219 Gastro-esophageal reflux disease without esophagitis: Secondary | ICD-10-CM | POA: Diagnosis not present

## 2016-11-18 DIAGNOSIS — R0683 Snoring: Secondary | ICD-10-CM

## 2016-11-18 DIAGNOSIS — J449 Chronic obstructive pulmonary disease, unspecified: Secondary | ICD-10-CM | POA: Diagnosis not present

## 2016-11-18 DIAGNOSIS — G2581 Restless legs syndrome: Secondary | ICD-10-CM | POA: Diagnosis not present

## 2016-11-18 DIAGNOSIS — R0681 Apnea, not elsewhere classified: Secondary | ICD-10-CM

## 2016-11-18 MED ORDER — RANITIDINE HCL 150 MG PO TABS: 150.0000 mg | ORAL_TABLET | Freq: Every day | ORAL | 3 refills | Status: DC

## 2016-11-18 MED ORDER — ZOLPIDEM TARTRATE ER 12.5 MG PO TBCR: EXTENDED_RELEASE_TABLET | ORAL | 0 refills | Status: DC

## 2016-11-18 NOTE — Patient Instructions (Addendum)
   Continue using your Trelegy inhaler and other medications as prescribed.  Call or e-mail me if you have any new breathing problems or questions before your next appointment.  Remember to avoid eating within 2 hours of bedtime.   If your heartburn doesn't get better on the Zantac you may need to elevate the head of your bed 3-5 inches.   We will review your test results when I see you back.  TESTS ORDERED: 1. Full PFTs before next appointment 2. on room air before next appointment 3. CT Chest w/o 4. Split night sleep study  5. Serum RAST Panel

## 2016-11-18 NOTE — Progress Notes (Signed)
Subjective:    Patient ID: Sherry Glass, female    DOB: 1970-07-10, 46 y.o.   MRN: [id number]  HPI She reports she first noticed problems with her breathing starting a year ago. She reports she moved into a new home and was walking in a local park but was having difficulty walking. She reports dyspnea with exposure to strong odors such as bug spray, hair spray, etc. She has had coughing & wheezing with her dyspnea. She will also wake up at night coughing. Cough is nonproductive. She does feel like her symptoms are progressively worsening. Her roommate has given her some oxygen to use at times and it seems to help. She uses Albuterol in her nebulizer with occasional relief of her symptoms. She was given Trelegy Ellipta a month ago with some improvement in her symptoms. She is using her nebulizer 3-4 times dial. She is currently not on Prednisone but has been treated with it intermittently. She denies any significant improvement using Prednisone. She reports she gets bronchitis about twice yearly. No history of pneumonia. No history of breathing problems or asthma as a child. She reports she has had coughing spells before before this is the most severe episode and doesn't seem to be improving. She reports mild seasonal sinus congestion & drainage. She reports more reflux recently and has been having reflux into her mouth 3-4 times weekly , especially recently.  She was diagnosed remotely with COPD during a breathing test while she had the Flu and was prescribed Spiriva as well as Symbicort previously. She reports she has had intermittent chest pain & tightness. She sleeps 4 hours a night off and on. She has trouble falling asleep. She does doze off easily during the day. She does wake up with a headache at times. She snores and has had witnessed apneas.   Review of Systems She does have sweats at night. No fever or chills. No rashes or bruising. A pertinent 14 point review of systems is negative except as per  the history of presenting illness.  Allergies  Allergen Reactions  . Dilaudid [Hydromorphone]     Itching and vomitting  . Hydromorphone   . Valproic Acid And Related     Current Outpatient Prescriptions on File Prior to Visit  Medication Sig Dispense Refill  . ACCU-CHEK FASTCLIX LANCETS MISC USE TO check blood glucose twice daily 102 each 2  . ACCU-CHEK GUIDE test strip USE TO check blood glucose twice daily 100 each 2  . albuterol (PROVENTIL) (2.5 MG/3ML) 0.083% nebulizer solution Use 1 vial (3 mL) via nebulizer every 6 hours as needed for wheezing or shortness of breath 150 mL 5  . aspirin EC 81 MG tablet Take one po qd 90 tablet 1  . cetirizine (ZYRTEC ALLERGY) 10 MG tablet Take 1 tablet (10 mg total) by mouth daily. 90 tablet 1  . CINNAMON PO Take 1,000 mg by mouth 2 (two) times daily.    . cyclobenzaprine (FLEXERIL) 10 MG tablet Take 1 Tablet by mouth 3 times a day as needed FOR MUSCLE SPASMS 60 tablet 2  . empagliflozin (JARDIANCE) 10 MG TABS tablet Take 10 mg by mouth daily. 90 tablet 1  . escitalopram (LEXAPRO) 20 MG tablet Take 20 mg by mouth daily.  5  . Fluticasone-Umeclidin-Vilant (TRELEGY ELLIPTA) 100-62.5-25 MCG/INH AEPB Inhale 1 puff into the lungs daily. 1 each 6  . JANUMET XR [phone] MG TB24 Take 1 Tablet by mouth once daily 34 tablet 0  . lisinopril (PRINIVIL,ZESTRIL)  10 MG tablet Take 1 Tablet by mouth once daily FOR kidney protection AND blood pressure 34 tablet 0  . nitroGLYCERIN (NITROSTAT) 0.4 MG SL tablet Place 1 tablet (0.4 mg total) under the tongue every 5 (five) minutes as needed for chest pain. 25 tablet 3  . pravastatin (PRAVACHOL) 20 MG tablet Take 1 tablet (20 mg total) by mouth daily. 90 tablet 3  . predniSONE (DELTASONE) 20 MG tablet Take 3 tabs daily for 1 week, then 2 tabs for 1 week, then 1 tab for one week 42 tablet 0  . propranolol (INDERAL) 20 MG tablet Take 1 tablet (20 mg total) by mouth 2 (two) times daily. 60 tablet 2  . Vitamin D,  Ergocalciferol, (DRISDOL) 50000 units CAPS capsule Take 1 capsule (50,000 Units total) by mouth every 7 (seven) days. 12 capsule 3  . vortioxetine HBr (TRINTELLIX) 20 MG TABS Take 20 mg by mouth daily. 90 tablet 1   No current facility-administered medications on file prior to visit.     Past Medical History:  Diagnosis Date  . Allergic rhinitis   . Anxiety and depression   . Cataract   . Chest pain syndrome   . COPD (chronic obstructive pulmonary disease) (HCC)   . Depression   . Diabetes mellitus without complication (HCC)   . History of kidney stones   . HTN (hypertension)   . Hyperglycemia   . Hypertension   . Obesity   . Restless leg syndrome   . Tobacco abuse     Past Surgical History:  Procedure Laterality Date  . ABDOMINAL HYSTERECTOMY    . CARDIAC CATHETERIZATION  2014  . CATARACT EXTRACTION W/PHACO Left 06/23/2016   Procedure: CATARACT EXTRACTION PHACO AND INTRAOCULAR LENS PLACEMENT (IOC);  Surgeon: Gemma Payor, MD;  Location: [facility];  Service: Ophthalmology;  Laterality: Left;  left - pt knows to arrive at 10:50 cde 9.11  . CESAREAN SECTION    . CHOLECYSTECTOMY    . EXCISION MASS NECK     cyst  . FOOT MASS EXCISION    . hysteectomy --- unknown    . left foot surgery     removal of cyst  . NECK SURGERY    . TONSILLECTOMY      Family History  Problem Relation Age of Onset  . Adopted: Yes  . Family history unknown: Yes    Social History   Social History  . Marital status: Widowed    Spouse name: N/A  . Number of children: N/A  . Years of education: N/A   Social History Main Topics  . Smoking status: Former Smoker    Packs/day: 1.00    Years: 25.00    Types: Cigarettes    Start date: 11/13/1988    Quit date: 10/28/2016  . Smokeless tobacco: Never Used  . Alcohol use Yes     Comment: For special occasions  . Drug use: No  . Sexual activity: No   Other Topics Concern  . None   Social History Narrative   [facility] (11/18/16):    Originally from [place] but grew up in [place]. Cared for her husband until he passed. 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 Does have significant dust exposure. Has 3 dogs currently. Previously had a parakeet as a child. No mold exposure. Enjoys playing with her grandchildren.           Objective:   Physical Exam BP 124/70 (BP Location: Right Arm, Cuff Size: Normal)  Pulse 72   Ht 5\' 3"  (1.6 m)   Wt 199 lb (90.3 kg)   SpO2 99%   BMI 35.25 kg/m  General:  Awake. Alert. No acute distress.Central obesity.  Integument:  Warm & dry. No rash on exposed skin. No bruising on exposed skin. Extremities:  No cyanosis or clubbing.  Lymphatics:  No appreciated cervical or supraclavicular lymphadenoapthy. HEENT:  Moist mucus membranes. No oral ulcers. No scleral injection or icterus. Mallampati class III. No nasal turbinate swelling. Cardiovascular:  Regular rate. No edema. No appreciable JVD. Normal S1 & S2. Pulmonary:  Good aeration & clear to auscultation bilaterally. Symmetric chest wall expansion. No accessory muscle use on room air. Abdomen: Soft. Normal bowel sounds. Protuberant. Grossly nontender. Musculoskeletal:  Normal bulk and tone. Hand grip strength 5/5 bilaterally. No joint deformity or effusion appreciated. Neurological:  CN 2-12 grossly in tact. No meningismus. Moving all 4 extremities equally. Symmetric brachioradialis deep tendon reflexes. Psychiatric:  Mood and affect congruent. Speech normal rhythm, rate & tone.   EPWORTH SLEEPINESS SCALE (11/18/16):  15  IMAGING CXR PA/LAT 11/11/16 (personally reviewed by me):  No parenchymal mass or opacity appreciated. No pleural effusion. Somewhat low lung volumes. Heart with borderline cardiomegaly. Mediastinum normal in contour.  CARDIAC TTE (08/16/16):  LV normal in size with EF 60-65%. No regional wall motion abnormalities. Normal diastolic function. LA & RA normal in size. RV normal in size and  function. No aortic stenosis or regurgitation. Aortic root normal in size. Trivial mitral regurgitation without stenosis. No pulmonic regurgitation. Trivial tricuspid regurgitation. No pericardial effusion.    Assessment & Plan:  46 y.o. female with long-standing history of tobacco use. Patient also has inhaled exposure through her work in Designer, fashion/clothing. Her ongoing cough/dyspnea could certainly be due to her uncontrolled reflux or alternatively uncontrolled COPD. I encouraged her to continue her current inhaler regimen. Reviewing her x-ray demonstrates no parenchymal abnormality that would indicate a cause for her cough. However, more detailed imaging of her chest is necessary to rule out an underlying malignancy. We discussed her reflux at length as well as lifestyle modifications. The patient does have previously diagnosed restless leg syndrome and symptoms consistent with probable sleep apnea as well as a high Epworth Sleepiness Scale score. I instructed the patient contact my office if she had any new breathing problems or questions before her appointment.  1. Cough/dyspnea: Likely secondary to underlying COPD. Given history of tobacco use checking CT chest without contrast. Checking 6 to walk test on room air before next appointment. 2. COPD: Continuing patient on Trelegy. Checking full pulmonary function testing before next appointment. Holding off on alpha-1 antitrypsin screening. 3. Snoring/witnessed apneas/restless leg syndrome: Checking split-night sleep study. 4. Insomnia: Prescribing Ambien CR 12.5 mg to use for her sleep study. 5. Chronic seasonal allergic rhinitis: Checking RAST panel. Continuing over-the-counter antihistamines as needed. 6. GERD: Patient counseled on appropriate dietary and lifestyle modification. Starting Zantac 150 mg by mouth daily at bedtime. 7. Health maintenance: Status post Pneumovax January 2007. 8. Follow-up: Patient to return to clinic in 2 months or sooner if  needed.   Donna Christen Jamison Neighbor, M.D. [facility] Pager:  [phone] After 3pm or if no response, call [phone] 11:03 AM 11/18/16

## 2016-11-21 LAB — RESPIRATORY ALLERGY PROFILE REGION II ~~LOC~~
Allergen, A. alternata, m6: <0.1 kU/L
Allergen, C. Herbarum, M2: 0.14 kU/L — ABNORMAL HIGH
Allergen, Cedar tree, t12: <0.1 kU/L
Allergen, Comm Silver Birch, t9: <0.1 kU/L
Allergen, Cottonwood, t14: 0.12 kU/L — ABNORMAL HIGH
Allergen, D pternoyssinus,d7: <0.1 kU/L
Allergen, Mouse Urine Protein, e78: <0.1 kU/L
Allergen, Mulberry, t76: <0.1 kU/L
Allergen, Oak,t7: 0.13 kU/L — ABNORMAL HIGH
Allergen, P. notatum, m1: <0.1 kU/L
Aspergillus fumigatus, m3: <0.1 kU/L
Bermuda Grass: 0.19 kU/L — ABNORMAL HIGH
Box Elder IgE: 0.11 kU/L — ABNORMAL HIGH
Cat Dander: 0.52 kU/L — ABNORMAL HIGH
Cockroach: 0.14 kU/L — ABNORMAL HIGH
Common Ragweed: 0.12 kU/L — ABNORMAL HIGH
D. farinae: <0.1 kU/L
Dog Dander: 0.4 kU/L — ABNORMAL HIGH
Elm IgE: 0.57 kU/L — ABNORMAL HIGH
IgE (Immunoglobulin E), Serum: 245 kU/L — ABNORMAL HIGH (ref <115)
Johnson Grass: 0.12 kU/L — ABNORMAL HIGH
Pecan/Hickory Tree IgE: 0.24 kU/L — ABNORMAL HIGH
Rough Pigweed  IgE: 0.78 kU/L — ABNORMAL HIGH
Sheep Sorrel IgE: 0.13 kU/L — ABNORMAL HIGH
Timothy Grass: 0.15 kU/L — ABNORMAL HIGH

## 2016-11-25 ENCOUNTER — Ambulatory Visit (INDEPENDENT_AMBULATORY_CARE_PROVIDER_SITE_OTHER): Payer: Medicaid Other | Admitting: Pulmonary Disease

## 2016-11-25 ENCOUNTER — Ambulatory Visit: Payer: Self-pay | Admitting: "Endocrinology

## 2016-11-25 ENCOUNTER — Ambulatory Visit (INDEPENDENT_AMBULATORY_CARE_PROVIDER_SITE_OTHER)
Admission: RE | Admit: 2016-11-25 | Discharge: 2016-11-25 | Disposition: A | Discharge status: Home / Self Care | Payer: Medicaid Other | Source: Ambulatory Visit | Attending: Pulmonary Disease | Admitting: Pulmonary Disease

## 2016-11-25 DIAGNOSIS — J449 Chronic obstructive pulmonary disease, unspecified: Secondary | ICD-10-CM

## 2016-11-25 LAB — PULMONARY FUNCTION TEST
DL/VA % pred: 110 %
DL/VA: 5.24 ml/min/mmHg/L
DLCO cor % pred: 83 %
DLCO cor: 20.06 ml/min/mmHg
DLCO unc % pred: 87 %
DLCO unc: 20.93 ml/min/mmHg
FEF 25-75 Post: 2.58 L/sec
FEF 25-75 Pre: 1.94 L/sec
FEF2575-%Change-Post: 33 %
FEF2575-%Pred-Post: 91 %
FEF2575-%Pred-Pre: 68 %
FEV1-%Change-Post: 5 %
FEV1-%Pred-Post: 69 %
FEV1-%Pred-Pre: 65 %
FEV1-Post: 1.97 L
FEV1-Pre: 1.86 L
FEV1FVC-%Change-Post: 3 %
FEV1FVC-%Pred-Pre: 101 %
FEV6-%Change-Post: 1 %
FEV6-%Pred-Post: 66 %
FEV6-%Pred-Pre: 65 %
FEV6-Post: 2.31 L
FEV6-Pre: 2.27 L
FEV6FVC-%Pred-Post: 103 %
FEV6FVC-%Pred-Pre: 103 %
FVC-%Change-Post: 1 %
FVC-%Pred-Post: 65 %
FVC-%Pred-Pre: 63 %
FVC-Post: 2.31 L
FVC-Pre: 2.27 L
Post FEV1/FVC ratio: 85 %
Post FEV6/FVC ratio: 100 %
Pre FEV1/FVC ratio: 82 %
Pre FEV6/FVC Ratio: 100 %

## 2016-11-25 NOTE — Progress Notes (Signed)
PFT done today. 

## 2016-11-29 ENCOUNTER — Telehealth: Payer: Self-pay | Admitting: Pulmonary Disease

## 2016-11-29 NOTE — Telephone Encounter (Signed)
Results have been explained to patient, pt expressed understanding. Nothing further needed.  Notes recorded by Roslynn AmbleNestor, Jennings E, MD on 11/28/2016 at 3:47 PM EDT Please let patient know that her chest CT scan does not show any emphysema. It does show a spot in her right upper lung which has been there since February 2017 and hasn't changed. Her pulmonary function test shows she is not able to take in a deep breath. We will review these test results in more depth at her follow-up appointment. Thank you.

## 2016-12-27 ENCOUNTER — Other Ambulatory Visit: Payer: Self-pay | Admitting: Family

## 2016-12-27 DIAGNOSIS — F411 Generalized anxiety disorder: Secondary | ICD-10-CM

## 2017-01-07 ENCOUNTER — Other Ambulatory Visit: Payer: Self-pay | Admitting: "Endocrinology

## 2017-01-07 ENCOUNTER — Other Ambulatory Visit: Payer: Self-pay | Admitting: Family

## 2017-01-07 DIAGNOSIS — J449 Chronic obstructive pulmonary disease, unspecified: Secondary | ICD-10-CM

## 2017-01-09 ENCOUNTER — Other Ambulatory Visit: Payer: Self-pay | Admitting: Family

## 2017-01-16 ENCOUNTER — Telehealth: Payer: Self-pay | Admitting: Pulmonary Disease

## 2017-01-16 MED ORDER — ZOLPIDEM TARTRATE 10 MG PO TABS: ORAL_TABLET | ORAL | 0 refills | Status: DC

## 2017-01-16 NOTE — Telephone Encounter (Signed)
Spoke with pharmacy, they would like to change the medication Ambien to the generic plain tablet. Insurnce will not cover this. Please advise JN.

## 2017-01-16 NOTE — Telephone Encounter (Signed)
Rx called to pharmacy for Ambien 10 mg # 1 - Take 1 tablet prior to sleep study.

## 2017-01-16 NOTE — Telephone Encounter (Signed)
That's fine

## 2017-01-20 ENCOUNTER — Ambulatory Visit (HOSPITAL_BASED_OUTPATIENT_CLINIC_OR_DEPARTMENT_OTHER): Payer: Medicaid Other | Attending: Pulmonary Disease | Admitting: Pulmonary Disease

## 2017-01-20 DIAGNOSIS — R0681 Apnea, not elsewhere classified: Secondary | ICD-10-CM | POA: Diagnosis not present

## 2017-01-20 DIAGNOSIS — R0683 Snoring: Secondary | ICD-10-CM | POA: Insufficient documentation

## 2017-01-20 DIAGNOSIS — G2581 Restless legs syndrome: Secondary | ICD-10-CM | POA: Insufficient documentation

## 2017-01-25 ENCOUNTER — Other Ambulatory Visit: Payer: Self-pay | Admitting: Family

## 2017-01-25 DIAGNOSIS — E1165 Type 2 diabetes mellitus with hyperglycemia: Secondary | ICD-10-CM

## 2017-01-26 NOTE — Progress Notes (Signed)
Subjective:    Patient ID: Sherry Glass, female    DOB: Aug 13, 1970, 46 y.o.   MRN: [id number]  C.C.:  Follow-up for Cough w/ Dyspnea, Witnessed Apnea, Chronic Seasonal Allergic Rhinitis, & GERD.  HPI Cough w/ Dyspnea: Found to have moderate restrictive lung disease on lung volumes. Likely secondary to central obesity. No evidence of significant desaturation during walk test today. She reports she is still having some intermittent dyspnea. The coughing remains significant, especially at night. Denies any production to her cough.   Witnessed Apnea: Split-night sleep study completed on 9/21. Awaiting results. She reports she was not put on a CPAP mask.   Chronic Seasonal Allergic Rhinitis: Significantly elevated IgE with multiple environmental antigens including dogs which she currently has. Denies any sinus congestion or drainage currently.   GERD: Started on Zantac at last appointment. She is still having some reflux & dyspepsia. No morning brash water taste.   Review of Systems She does have a pleuritic component to chest discomfort with difficulty breathing. Does have pain with coughing spells. No fever or chills. No rashes or bruising.   Allergies  Allergen Reactions  . Dilaudid [Hydromorphone]     Itching and vomitting  . Hydromorphone   . Valproic Acid And Related     Current Outpatient Prescriptions on File Prior to Visit  Medication Sig Dispense Refill  . ACCU-CHEK FASTCLIX LANCETS MISC USE TO check blood glucose twice daily 102 each 2  . ACCU-CHEK GUIDE test strip USE TO check blood glucose twice daily 100 each 2  . albuterol (PROVENTIL) (2.5 MG/3ML) 0.083% nebulizer solution Use 1 vial (3 mL) via nebulizer every 6 hours as needed for wheezing or shortness of breath 150 mL 5  . aspirin EC 81 MG tablet Take one po qd 90 tablet 1  . cetirizine (ZYRTEC) 10 MG tablet Take 1 Tablet by mouth once daily 90 tablet 0  . CINNAMON PO Take 1,000 mg by mouth 2 (two) times daily.    .  cyclobenzaprine (FLEXERIL) 10 MG tablet Take 1 Tablet by mouth 3 times a day as needed for muscle spasms 60 tablet 3  . escitalopram (LEXAPRO) 20 MG tablet Take 1 Tablet by mouth once daily 30 tablet 1  . Fluticasone-Umeclidin-Vilant (TRELEGY ELLIPTA) 100-62.5-25 MCG/INH AEPB Inhale 1 puff into the lungs daily. 1 each 6  . JANUMET XR [phone] MG TB24 Take 1 Tablet by mouth once daily 90 tablet 0  . JARDIANCE 10 MG TABS tablet Take 1 Tablet by mouth once daily 90 tablet 0  . lisinopril (PRINIVIL,ZESTRIL) 10 MG tablet Take 1 Tablet by mouth once daily FOR kidney protection AND blood pressure 34 tablet 3  . nitroGLYCERIN (NITROSTAT) 0.4 MG SL tablet Place 1 tablet (0.4 mg total) under the tongue every 5 (five) minutes as needed for chest pain. 25 tablet 3  . pravastatin (PRAVACHOL) 20 MG tablet Take 1 tablet (20 mg total) by mouth daily. 90 tablet 3  . predniSONE (DELTASONE) 20 MG tablet Take 3 tabs daily for 1 week, then 2 tabs for 1 week, then 1 tab for one week 42 tablet 0  . propranolol (INDERAL) 20 MG tablet Take 1 Tablet by mouth 2 times a day 60 tablet 0  . ranitidine (ZANTAC) 150 MG tablet Take 1 tablet (150 mg total) by mouth at bedtime. 30 tablet 3  . Vitamin D, Ergocalciferol, (DRISDOL) 50000 units CAPS capsule Take 1 capsule (50,000 Units total) by mouth every 7 (seven) days. 12 capsule 3  .  vortioxetine HBr (TRINTELLIX) 20 MG TABS Take 20 mg by mouth daily. 90 tablet 1   No current facility-administered medications on file prior to visit.     Past Medical History:  Diagnosis Date  . Allergic rhinitis   . Anxiety and depression   . Cataract   . Chest pain syndrome   . COPD (chronic obstructive pulmonary disease) (HCC)   . Depression   . Diabetes mellitus without complication (HCC)   . History of kidney stones   . HTN (hypertension)   . Hyperglycemia   . Hypertension   . Obesity   . Restless leg syndrome   . Tobacco abuse     Past Surgical History:  Procedure Laterality  Date  . ABDOMINAL HYSTERECTOMY    . CARDIAC CATHETERIZATION  2014  . CATARACT EXTRACTION W/PHACO Left 06/23/2016   Procedure: CATARACT EXTRACTION PHACO AND INTRAOCULAR LENS PLACEMENT (IOC);  Surgeon: Gemma Payor, MD;  Location: [facility];  Service: Ophthalmology;  Laterality: Left;  left - pt knows to arrive at 10:50 cde 9.11  . CESAREAN SECTION    . CHOLECYSTECTOMY    . EXCISION MASS NECK     cyst  . FOOT MASS EXCISION    . hysteectomy --- unknown    . left foot surgery     removal of cyst  . NECK SURGERY    . TONSILLECTOMY      Family History  Problem Relation Age of Onset  . Adopted: Yes  . Family history unknown: Yes    Social History   Social History  . Marital status: Widowed    Spouse name: N/A  . Number of children: N/A  . Years of education: N/A   Social History Main Topics  . Smoking status: Former Smoker    Packs/day: 1.00    Years: 25.00    Types: Cigarettes    Start date: 11/13/1988    Quit date: 10/28/2016  . Smokeless tobacco: Never Used  . Alcohol use Yes     Comment: For special occasions  . Drug use: No  . Sexual activity: No   Other Topics Concern  . None   Social History Narrative   [facility] (11/18/16):   Originally from [place] but grew up in [place]. Cared for her husband until he passed. 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 Does have significant dust exposure. Has 3 dogs currently. Previously had a parakeet as a child. No mold exposure. Enjoys playing with her grandchildren.           Objective:   Physical Exam BP 126/74 (BP Location: Right Arm, Cuff Size: Normal)   Pulse 76   Ht  (1.6 m)   Wt 196 lb (88.9 kg)   SpO2 97%   BMI 34.72 kg/m   General:  Awake. Alert. Obese. Granddaughter with patient. Integument:  Warm & dry. No rash on exposed skin. No bruising on exposed skin. Extremities:  No cyanosis or clubbing.  HEENT:  Moist mucus membranes. No oral ulcers. No scleral injection  or icterus. Minimal nasal turbinate swelling. Cardiovascular:  Regular rate. No edema. No appreciable JVD.  Pulmonary:  Clear with auscultation. Normal work of breathing on room air. Abdomen: Soft. Normal bowel sounds. Protuberant. Musculoskeletal:  Normal bulk and tone. No joint deformity or effusion appreciated.  PFT 11/25/16: FVC 2.27 L (63%) FEV1 1.86 L (65%) FEV1/FVC 0.82 FEF 25-75 1.94 L (68%) negative bronchodilator response TLC 3.26 L (64%) RV 71% ERV 37%  DLCO corrected 83%  01/27/17:  Walked 405 meters / Baseline Sat 99% on RA / Nadir Sat 95% on RA  SPLIT NIGHT SLEEP STUDY (01/20/17):  Pending  EPWORTH SLEEPINESS SCALE (11/18/16):  15  IMAGING CT CHEST W/O 11/25/16 (personally reviewed by me):  Predominantly groundglass central nodule in right upper lobe. Similar to previous CT angiogram of the chest performed February 2017. No evidence of progression. No new nodules appreciated. No pleural effusion or thickening. No pericardial effusion. No pathologic mediastinal adenopathy.  CXR PA/LAT 11/11/16 (previously reviewed by me):  No parenchymal mass or opacity appreciated. No pleural effusion. Somewhat low lung volumes. Heart with borderline cardiomegaly. Mediastinum normal in contour.  CARDIAC TTE (08/16/16):  LV normal in size with EF 60-65%. No regional wall motion abnormalities. Normal diastolic function. LA & RA normal in size. RV normal in size and function. No aortic stenosis or regurgitation. Aortic root normal in size. Trivial mitral regurgitation without stenosis. No pulmonic regurgitation. Trivial tricuspid regurgitation. No pericardial effusion.  LABS 11/18/16 IgE: 245 RAST panel: Multiple positives for cat, dog, grasses, mold, trees, etc.    Assessment & Plan:  46 y.o. female with ongoing cough & dyspnea. I reviewed her PFTs showing restriction as well as her CT imaging. Patient is frustrated & tearful at times regarding the process. I reviewed her testing extensively  outlining the abnormalities and reasoning behind trying alternative medications. Eventually the patient agreed to try a new regimen. I instructed her to call our office back in a week or so to let me know if her symptoms were improving. I'm still awaiting her sleep study results.  1. Restrictive lung disease: Likely secondary to obesity. No further testing. 2. Cough & Dyspnea:  Suspect underlying asthma/copd overlap. Starting Singulair  qhs & given sample of Dulera 200 to try with instructions. 3. Witnessed apnea: Split-night sleep study result pending. 4. Chronic seasonal allergic rhinitis: Multiple environmental allergens identified. Starting Singulair.  5. GERD:  Continuing Zantac. Addition Prilosec  daily to her regimen. Patient declines Barium Swallow/Esophagram. 6. Right upper lobe groundglass nodule: Repeat CT chest without contrast July 2019. 7. Health maintenance: Status post Pneumovax January 2007. 8. Follow-up: Return to clinic in 4 weeks or sooner if needed.  Donna Christen Jamison Neighbor, M.D. [facility] Pager:  [phone] After 3pm or if no response, call [phone] 3:06 PM 01/27/17

## 2017-01-27 ENCOUNTER — Encounter: Payer: Self-pay | Admitting: Pulmonary Disease

## 2017-01-27 ENCOUNTER — Ambulatory Visit (INDEPENDENT_AMBULATORY_CARE_PROVIDER_SITE_OTHER): Payer: Medicaid Other | Admitting: Pulmonary Disease

## 2017-01-27 ENCOUNTER — Ambulatory Visit (INDEPENDENT_AMBULATORY_CARE_PROVIDER_SITE_OTHER): Payer: Medicaid Other | Admitting: *Deleted

## 2017-01-27 VITALS — BP 126/74 | HR 76 | Ht 63.0 in | Wt 196.0 lb

## 2017-01-27 DIAGNOSIS — K219 Gastro-esophageal reflux disease without esophagitis: Secondary | ICD-10-CM

## 2017-01-27 DIAGNOSIS — R0681 Apnea, not elsewhere classified: Secondary | ICD-10-CM | POA: Diagnosis not present

## 2017-01-27 DIAGNOSIS — J984 Other disorders of lung: Secondary | ICD-10-CM

## 2017-01-27 DIAGNOSIS — J449 Chronic obstructive pulmonary disease, unspecified: Secondary | ICD-10-CM

## 2017-01-27 DIAGNOSIS — R911 Solitary pulmonary nodule: Secondary | ICD-10-CM | POA: Insufficient documentation

## 2017-01-27 DIAGNOSIS — IMO0001 Reserved for inherently not codable concepts without codable children: Secondary | ICD-10-CM

## 2017-01-27 MED ORDER — MONTELUKAST SODIUM 10 MG PO TABS: 10.0000 mg | ORAL_TABLET | Freq: Every day | ORAL | 1 refills | Status: DC

## 2017-01-27 MED ORDER — OMEPRAZOLE 40 MG PO CPDR: 40.0000 mg | DELAYED_RELEASE_CAPSULE | Freq: Every day | ORAL | 1 refills | Status: DC

## 2017-01-27 MED ORDER — MOMETASONE FURO-FORMOTEROL FUM 200-5 MCG/ACT IN AERO: 2.0000 | INHALATION_SPRAY | Freq: Two times a day (BID) | RESPIRATORY_TRACT | 0 refills | Status: DC

## 2017-01-27 NOTE — Patient Instructions (Addendum)
   Use the [facility] inhaler we are giving you today by doing 2 puffs twice daily. Call me in 1-2 weeks to let me know if this is helping your coughing & breathing.  Remember to remove any dentures or partials you have before you use your inhaler. Remember to brush your teeth & tongue after you use your inhaler as well as rinse, gargle & spit to keep from getting thrush in your mouth or on your tongue (a white film).   We are also starting you on an allergy medication at night before bed.  We are going to keep you on Zantac. I'm adding Prilosec to your regimen. Take this 30 minutes before breakfast in the morning.  We will see you back in 4 weeks or sooner if needed.   TESTS ORDERED: 1. CT CHEST W/O July 2019

## 2017-01-27 NOTE — Progress Notes (Signed)
SIX MIN WALK 01/27/2017  Medications Aspirin , prednisone  & Janumet  all taken at 9:00am  Supplimental Oxygen during Test? (L/min) No  Laps 8  Partial Lap (in Meters) 21  Baseline BP (sitting) 140/80  Baseline Heartrate 79  Baseline Dyspnea (Borg Scale) 0  Baseline Fatigue (Borg Scale) 0.5  Baseline SPO2 99  BP (sitting) 144/82  Heartrate 91  Dyspnea (Borg Scale) 4  Fatigue (Borg Scale) 0.5  SPO2 95  BP (sitting) 136/78  Heartrate 77  SPO2 100  Stopped or Paused before Six Minutes No  Distance Completed 405  Tech Comments: test performed with forehead probe. pt completed test at moderate pace with no complaints or desats.

## 2017-01-29 DIAGNOSIS — R0683 Snoring: Secondary | ICD-10-CM | POA: Diagnosis not present

## 2017-01-29 NOTE — Procedures (Signed)
    Patient Name: Sherry Glass, Sherry Glass Date: 01/20/2017   Gender: Female  D.O.B: 1971-04-16  Age (years): 46  Referring Provider: Roslynn Amble   Height (inches): 63  Interpreting Physician: Coralyn Helling MD, ABSM  Weight (lbs): 195  RPSGT: Rolene Arbour   BMI: 35  MRN: [id number]  Neck Size: 16.00    CLINICAL INFORMATION  Sleep Study Type: NPSG  Indication for sleep study: Snoring, Witnessed Apneas  Epworth Sleepiness Score: 9  SLEEP STUDY TECHNIQUE  As per the AASM Manual for the Scoring of Sleep and Associated Events v2.3 (April 2016) with a hypopnea requiring 4% desaturations.  The channels recorded and monitored were frontal, central and occipital EEG, electrooculogram (EOG), submentalis EMG (chin), nasal and oral airflow, thoracic and abdominal wall motion, anterior tibialis EMG, snore microphone, electrocardiogram, and pulse oximetry.  MEDICATIONS  Medications self-administered by patient taken the night of the study : LISINOPRIL, ESCITALOPRAM OXALATE, JARDIANCE  SLEEP ARCHITECTURE  The study was initiated at 10:34:54 PM and ended at 4:38:02 AM.  Sleep onset time was 32.3 minutes and the sleep efficiency was 55.9%. The total sleep time was 202.8 minutes.  Stage REM latency was N/A minutes.  The patient spent 5.92% of the night in stage N1 sleep, 94.08% in stage N2 sleep, 0.00% in stage N3 and 0.00% in REM.  Alpha intrusion was absent.  Supine sleep was 29.14%.  RESPIRATORY PARAMETERS  The overall apnea/hypopnea index (AHI) was 1.2 per hour. There were 0 total apneas, including 0 obstructive, 0 central and 0 mixed apneas. There were 4 hypopneas and 1 RERAs. The AHI during Stage REM sleep was N/A per hour. AHI while supine was 2.0 per hour. The mean oxygen saturation was 92.84%. The minimum SpO2 during sleep was 89.00%. Supplemental oxygen was not applied during this study. moderate snoring was noted during this study. CARDIAC DATA  The 2 lead EKG demonstrated  sinus rhythm. The mean heart rate was 80.44 beats per minute. Other EKG findings include: None.  LEG MOVEMENT DATA  The total PLMS were 26 with a resulting PLMS index of 7.69. Associated arousal with leg movement index was 2.1 . IMPRESSIONS  - While she had a few respiratory events these were not frequent enough to meet diagnostic criteria for sleep apnea. Her AHI was 1.2 with a SpO2 low of 89%.  - The patient snored with moderate snoring volume. DIAGNOSIS  - Snoring (R06.83) RECOMMENDATIONS  - Avoid alcohol, sedatives and other CNS depressants that may worsen sleep apnea and disrupt normal sleep architecture. - Sleep hygiene should be reviewed to assess factors that may improve sleep quality. - Weight management and regular exercise should be initiated or continued if appropriate. [Electronically signed] 01/29/2017 05:41 PM Coralyn Helling MD, ABSM  Diplomate, American Board of Sleep Medicine  NPI: [id number]

## 2017-02-13 ENCOUNTER — Ambulatory Visit (INDEPENDENT_AMBULATORY_CARE_PROVIDER_SITE_OTHER): Payer: Medicaid Other | Admitting: Family

## 2017-02-13 ENCOUNTER — Encounter: Payer: Self-pay | Admitting: Family

## 2017-02-13 VITALS — BP 114/63 | HR 81 | Temp 98.9°F | Ht 63.0 in | Wt 199.6 lb

## 2017-02-13 DIAGNOSIS — E1165 Type 2 diabetes mellitus with hyperglycemia: Secondary | ICD-10-CM | POA: Diagnosis not present

## 2017-02-13 DIAGNOSIS — E669 Obesity, unspecified: Secondary | ICD-10-CM | POA: Diagnosis not present

## 2017-02-13 DIAGNOSIS — I10 Essential (primary) hypertension: Secondary | ICD-10-CM | POA: Diagnosis not present

## 2017-02-13 DIAGNOSIS — E785 Hyperlipidemia, unspecified: Secondary | ICD-10-CM

## 2017-02-13 DIAGNOSIS — J449 Chronic obstructive pulmonary disease, unspecified: Secondary | ICD-10-CM

## 2017-02-13 DIAGNOSIS — F411 Generalized anxiety disorder: Secondary | ICD-10-CM

## 2017-02-13 DIAGNOSIS — F331 Major depressive disorder, recurrent, moderate: Secondary | ICD-10-CM

## 2017-02-13 LAB — BAYER DCA HB A1C WAIVED: HB A1C (BAYER DCA - WAIVED): 7 % — ABNORMAL HIGH (ref <7.0)

## 2017-02-13 NOTE — Patient Instructions (Signed)
Diabetes Mellitus and Food It is important for you to manage your blood sugar (glucose) level. Your blood glucose level can be greatly affected by what you eat. Eating healthier foods in the appropriate amounts throughout the day at about the same time each day will help you control your blood glucose level. It can also help slow or prevent worsening of your diabetes mellitus. Healthy eating may even help you improve the level of your blood pressure and reach or maintain a healthy weight. General recommendations for healthful eating and cooking habits include:  Eating meals and snacks regularly. Avoid going long periods of time without eating to lose weight.  Eating a diet that consists mainly of plant-based foods, such as fruits, vegetables, nuts, legumes, and whole grains.  Using low-heat cooking methods, such as baking, instead of high-heat cooking methods, such as deep frying.  Work with your dietitian to make sure you understand how to use the Nutrition Facts information on food labels. How can food affect me? Carbohydrates Carbohydrates affect your blood glucose level more than any other type of food. Your dietitian will help you determine how many carbohydrates to eat at each meal and teach you how to count carbohydrates. Counting carbohydrates is important to keep your blood glucose at a healthy level, especially if you are using insulin or taking certain medicines for diabetes mellitus. Alcohol Alcohol can cause sudden decreases in blood glucose (hypoglycemia), especially if you use insulin or take certain medicines for diabetes mellitus. Hypoglycemia can be a life-threatening condition. Symptoms of hypoglycemia (sleepiness, dizziness, and disorientation) are similar to symptoms of having too much alcohol. If your health care provider has given you approval to drink alcohol, do so in moderation and use the following guidelines:  Women should not have more than one drink per day, and men  should not have more than two drinks per day. One drink is equal to: ? 12 oz of beer. ? 5 oz of wine. ? 1 oz of hard liquor.  Do not drink on an empty stomach.  Keep yourself hydrated. Have water, diet soda, or unsweetened iced tea.  Regular soda, juice, and other mixers might contain a lot of carbohydrates and should be counted.  What foods are not recommended? As you make food choices, it is important to remember that all foods are not the same. Some foods have fewer nutrients per serving than other foods, even though they might have the same number of calories or carbohydrates. It is difficult to get your body what it needs when you eat foods with fewer nutrients. Examples of foods that you should avoid that are high in calories and carbohydrates but low in nutrients include:  Trans fats (most processed foods list trans fats on the Nutrition Facts label).  Regular soda.  Juice.  Candy.  Sweets, such as cake, pie, doughnuts, and cookies.  Fried foods.  What foods can I eat? Eat nutrient-rich foods, which will nourish your body and keep you healthy. The food you should eat also will depend on several factors, including:  The calories you need.  The medicines you take.  Your weight.  Your blood glucose level.  Your blood pressure level.  Your cholesterol level.  You should eat a variety of foods, including:  Protein. ? Lean cuts of meat. ? Proteins low in saturated fats, such as fish, egg whites, and beans. Avoid processed meats.  Fruits and vegetables. ? Fruits and vegetables that may help control blood glucose levels, such as apples,   mangoes, and yams.  Dairy products. ? Choose fat-free or low-fat dairy products, such as milk, yogurt, and cheese.  Grains, bread, pasta, and rice. ? Choose whole grain products, such as multigrain bread, whole oats, and brown rice. These foods may help control blood pressure.  Fats. ? Foods containing healthful fats, such as  nuts, avocado, olive oil, canola oil, and fish.  Does everyone with diabetes mellitus have the same meal plan? Because every person with diabetes mellitus is different, there is not one meal plan that works for everyone. It is very important that you meet with a dietitian who will help you create a meal plan that is just right for you. This information is not intended to replace advice given to you by your health care provider. Make sure you discuss any questions you have with your health care provider. Document Released: 01/13/2005 Document Revised: 09/24/2015 Document Reviewed: 03/15/2013 Elsevier Interactive Patient Education  2017 [employer]  

## 2017-02-13 NOTE — Progress Notes (Signed)
Subjective:    Patient ID: Sherry Glass, female    DOB: 14-Nov-1970, 46 y.o.   MRN: [id number]  Pt presents to the office today for chronic follow up. Pt is followed by Pulmonologist for COPD, Restrictive lung disease, and SOB and followed by Endocrinologists for hyperthyroidism.   Hypertension  This is a chronic problem. The current episode started more than 1 year ago. The problem has been resolved since onset. The problem is controlled. Associated symptoms include anxiety and shortness of breath. Pertinent negatives include no blurred vision or peripheral edema. Risk factors for coronary artery disease include diabetes mellitus, dyslipidemia, obesity, post-menopausal state and sedentary lifestyle. The current treatment provides moderate improvement. There is no history of kidney disease, CAD/MI, CVA or heart failure.  Diabetes  She presents for her follow-up diabetic visit. She has type 2 diabetes mellitus. Pertinent negatives for hypoglycemia include no nervousness/anxiousness. Pertinent negatives for diabetes include no blurred vision, no foot paresthesias and no foot ulcerations. There are no hypoglycemic complications. Symptoms are stable. Pertinent negatives for diabetic complications include no CVA, heart disease or peripheral neuropathy. Risk factors for coronary artery disease include dyslipidemia, diabetes mellitus, hypertension, sedentary lifestyle and post-menopausal. Her weight is stable. She is following a generally unhealthy diet. Her breakfast blood glucose range is generally 110-130 mg/dl.  Hyperlipidemia  This is a chronic problem. The current episode started more than 1 year ago. The problem is uncontrolled. Recent lipid tests were reviewed and are high. Exacerbating diseases include obesity. Associated symptoms include shortness of breath. Current antihyperlipidemic treatment includes statins. The current treatment provides moderate improvement of lipids. Risk factors for coronary  artery disease include diabetes mellitus, dyslipidemia, obesity, hypertension, post-menopausal and a sedentary lifestyle.  Depression         This is a chronic problem.  The current episode started more than 1 year ago.   The onset quality is gradual.   The problem occurs intermittently.  The problem has been waxing and waning since onset.  Associated symptoms include decreased concentration, irritable and sad.  Associated symptoms include no helplessness and no hopelessness.  Past medical history includes anxiety.   Anxiety  Presents for follow-up visit. Symptoms include decreased concentration, depressed mood, excessive worry, irritability and shortness of breath. Patient reports no nervous/anxious behavior. Symptoms occur most days. The quality of sleep is good.    Gastroesophageal Reflux  She complains of wheezing. She reports no belching, no coughing or no heartburn. This is a chronic problem. The current episode started more than 1 year ago. The problem occurs occasionally. The problem has been waxing and waning. The symptoms are aggravated by exertion. She has tried a PPI for the symptoms. The treatment provided mild relief.      Review of Systems  Constitutional: Positive for irritability.  Eyes: Negative for blurred vision.  Respiratory: Positive for shortness of breath and wheezing. Negative for cough.   Gastrointestinal: Negative for heartburn.  Psychiatric/Behavioral: Positive for decreased concentration and depression. The patient is not nervous/anxious.   All other systems reviewed and are negative.      Objective:   Physical Exam  Constitutional: She is oriented to person, place, and time. She appears well-developed and well-nourished. She is irritable. No distress.  HENT:  Head: Normocephalic and atraumatic.  Right Ear: External ear normal.  Left Ear: External ear normal.  Nose: Nose normal.  Mouth/Throat: Oropharynx is clear and moist.  Eyes: Pupils are equal, round,  and reactive to light.  Neck: Normal range  of motion. Neck supple. No thyromegaly present.  Cardiovascular: Normal rate, regular rhythm, normal heart sounds and intact distal pulses.   No murmur heard. Pulmonary/Chest: Effort normal and breath sounds normal. No respiratory distress. She has no wheezes.  Abdominal: Soft. Bowel sounds are normal. She exhibits no distension. There is no tenderness.  Musculoskeletal: Normal range of motion. She exhibits no edema or tenderness.  Neurological: She is alert and oriented to person, place, and time.  Skin: Skin is warm and dry.  Psychiatric: She has a normal mood and affect. Her behavior is normal. Judgment and thought content normal.  Vitals reviewed.     BP 114/63   Pulse 81   Temp 98.9 F (37.2 C) (Oral)   Ht '5\' 3"'  (1.6 m)   Wt 199 lb 9.6 oz (90.5 kg)   BMI 35.36 kg/m      Assessment & Plan:  1. Uncontrolled type 2 diabetes mellitus with hyperglycemia (HCC) - Bayer DCA Hb A1c Waived - CMP14+EGFR  2. Obesity (BMI 30-39.9) - CMP14+EGFR  3. Essential hypertension - CMP14+EGFR  4. Hyperlipidemia, unspecified hyperlipidemia type - CMP14+EGFR - Lipid panel  5. Moderate episode of recurrent major depressive disorder (HCC)  - CMP14+EGFR  6. Chronic obstructive pulmonary disease, unspecified COPD type ([place]) - CMP14+EGFR  7. GAD (generalized anxiety disorder)  - CMP14+EGFR   Continue all meds keep all specialists appointment  Labs pending Health Maintenance reviewed- Refuses all vaccines  Diet and exercise encouraged RTO 4 months  Evelina Dun, [name]

## 2017-02-14 LAB — CMP14+EGFR
ALT: 19 IU/L (ref 0–32)
AST: 20 IU/L (ref 0–40)
Albumin/Globulin Ratio: 1.6 (ref 1.2–2.2)
Albumin: 4.3 g/dL (ref 3.5–5.5)
Alkaline Phosphatase: 96 IU/L (ref 39–117)
BUN/Creatinine Ratio: 20 (ref 9–23)
BUN: 11 mg/dL (ref 6–24)
Bilirubin Total: 0.8 mg/dL (ref 0.0–1.2)
CO2: 23 mmol/L (ref 20–29)
Calcium: 10 mg/dL (ref 8.7–10.2)
Chloride: 99 mmol/L (ref 96–106)
Creatinine, Ser: 0.56 mg/dL — ABNORMAL LOW (ref 0.57–1.00)
GFR calc Af Amer: 128 mL/min/{1.73_m2} (ref >59)
GFR calc non Af Amer: 111 mL/min/{1.73_m2} (ref >59)
Globulin, Total: 2.7 g/dL (ref 1.5–4.5)
Glucose: 153 mg/dL — ABNORMAL HIGH (ref 65–99)
Potassium: 5 mmol/L (ref 3.5–5.2)
Sodium: 140 mmol/L (ref 134–144)
Total Protein: 7 g/dL (ref 6.0–8.5)

## 2017-02-14 LAB — LIPID PANEL
Chol/HDL Ratio: 2.9 ratio (ref 0.0–4.4)
Cholesterol, Total: 128 mg/dL (ref 100–199)
HDL: 44 mg/dL (ref >39)
LDL Calculated: 62 mg/dL (ref 0–99)
Triglycerides: 110 mg/dL (ref 0–149)
VLDL Cholesterol Cal: 22 mg/dL (ref 5–40)

## 2017-02-24 ENCOUNTER — Ambulatory Visit: Payer: Self-pay | Admitting: Pulmonary Disease

## 2017-03-01 ENCOUNTER — Other Ambulatory Visit: Payer: Self-pay | Admitting: Family

## 2017-03-01 DIAGNOSIS — F411 Generalized anxiety disorder: Secondary | ICD-10-CM

## 2017-03-13 ENCOUNTER — Encounter: Payer: Self-pay | Admitting: Pulmonary Disease

## 2017-03-13 ENCOUNTER — Ambulatory Visit: Payer: Medicaid Other | Admitting: Pulmonary Disease

## 2017-03-13 VITALS — BP 122/72 | HR 79 | Ht 64.0 in | Wt 202.2 lb

## 2017-03-13 DIAGNOSIS — K219 Gastro-esophageal reflux disease without esophagitis: Secondary | ICD-10-CM

## 2017-03-13 DIAGNOSIS — R911 Solitary pulmonary nodule: Secondary | ICD-10-CM

## 2017-03-13 DIAGNOSIS — J302 Other seasonal allergic rhinitis: Secondary | ICD-10-CM

## 2017-03-13 DIAGNOSIS — IMO0001 Reserved for inherently not codable concepts without codable children: Secondary | ICD-10-CM

## 2017-03-13 DIAGNOSIS — J4540 Moderate persistent asthma, uncomplicated: Secondary | ICD-10-CM | POA: Diagnosis not present

## 2017-03-13 MED ORDER — FORMOTEROL FUMARATE 20 MCG/2ML IN NEBU: 20.0000 ug | INHALATION_SOLUTION | Freq: Two times a day (BID) | RESPIRATORY_TRACT | 3 refills | Status: DC

## 2017-03-13 MED ORDER — BUDESONIDE 0.5 MG/2ML IN SUSP: 0.5000 mg | Freq: Two times a day (BID) | RESPIRATORY_TRACT | 3 refills | Status: DC

## 2017-03-13 NOTE — Progress Notes (Signed)
Subjective:    Patient ID: Sherry Glass, female    DOB: [birth year], 46 y.o.   MRN: [id number]  C.C.:  Follow-up for Cough w/ Dyspnea, Chronic Seasonal Allergic Rhinitis, Right Upper Lobe Nodule, GERD, & Restrictive Lung Disease.  HPI Patient split-night sleep study did not show any significant sleep apnea or periodic leg movement disorder. It did demonstrate her snoring.  Cough with dyspnea: Multifactorial from obesity and restriction. Suspected underlying asthma. Started on Singulair 10 mg daily at bedtime at last appointment and given sample of Dulera 200 to try. She reports no relief from her Dulera. She reports her cough did improve somewhat on Dulera. Cough remains nonproductive. She is wheezing intermittently throught the day. She is waking up at night coughing. She is using her nebulizer a couple of times at night. It does help her stop coughing. She has noticed breathing problems with exposure to aerosol sprays.   Chronic seasonal allergic rhinitis: Multiple environmental allergies including dogs which she has. Started on Singulair at last appointment. She denies any sinus congestion, pressure or drainage.  Right upper lobe nodule: Primarily groundglass. Seen initially February 2017 on CT imaging. No change on July 2018 CT imaging. Plan for repeat CT imaging July 2019.  GERD: Previously started on Zantac. Patient declined esophagogram at last appointment. She reports no reflux, dyspepsia, or morning brash water taste. She is out of Zantac for 1 week.   Restrictive lung disease: Likely secondary to obesity. No signs or suggestion of interstitial lung disease on CT imaging. No further testing.  Review of Systems She denies any new chest pain. Has no chest pressure or tightness. No fever or chills. No abdominal pain or nausea.   Allergies  Allergen Reactions  . Dilaudid [Hydromorphone]     Itching and vomitting  . Hydromorphone   . Valproic Acid And Related     Current Outpatient  Medications on File Prior to Visit  Medication Sig Dispense Refill  . ACCU-CHEK FASTCLIX LANCETS MISC USE TO check blood glucose twice daily 102 each 2  . ACCU-CHEK GUIDE test strip USE TO check blood glucose twice daily 100 each 2  . albuterol (PROVENTIL) (2.5 MG/3ML) 0.083% nebulizer solution Use 1 vial (3 mL) via nebulizer every 6 hours as needed for wheezing or shortness of breath 150 mL 5  . aspirin EC 81 MG tablet Take one po qd 90 tablet 1  . cetirizine (ZYRTEC) 10 MG tablet Take 1 Tablet by mouth once daily 90 tablet 0  . CINNAMON PO Take 1,000 mg by mouth 2 (two) times daily.    . cyclobenzaprine (FLEXERIL) 10 MG tablet Take 1 Tablet by mouth 3 times a day as needed for muscle spasms 60 tablet 3  . escitalopram (LEXAPRO) 20 MG tablet Take 1 Tablet by mouth once daily 30 tablet 1  . JANUMET XR [id number] MG TB24 Take 1 Tablet by mouth once daily 90 tablet 0  . JARDIANCE 10 MG TABS tablet Take 1 Tablet by mouth once daily 90 tablet 0  . lisinopril (PRINIVIL,ZESTRIL) 10 MG tablet Take 1 Tablet by mouth once daily FOR kidney protection AND blood pressure 34 tablet 3  . mometasone-formoterol (DULERA) 200-5 MCG/ACT AERO Inhale 2 puffs into the lungs 2 (two) times daily. 1 Inhaler 0  . montelukast (SINGULAIR) 10 MG tablet Take 1 tablet (10 mg total) by mouth at bedtime. 30 tablet 1  . nitroGLYCERIN (NITROSTAT) 0.4 MG SL tablet Place 1 tablet (0.4 mg total) under the tongue every  5 (five) minutes as needed for chest pain. 25 tablet 3  . omeprazole (PRILOSEC) 40 MG capsule Take 1 capsule (40 mg total) by mouth daily. 30 capsule 1  . pravastatin (PRAVACHOL) 20 MG tablet Take 1 tablet (20 mg total) by mouth daily. 90 tablet 3  . propranolol (INDERAL) 20 MG tablet Take 1 Tablet by mouth 2 times a day 60 tablet 0  . Vitamin D, Ergocalciferol, (DRISDOL) 50000 units CAPS capsule Take 1 capsule (50,000 Units total) by mouth every 7 (seven) days. 12 capsule 3   No current facility-administered medications  on file prior to visit.     Past Medical History:  Diagnosis Date  . Allergic rhinitis   . Anxiety and depression   . Cataract   . Chest pain syndrome   . COPD (chronic obstructive pulmonary disease) (HCC)   . Depression   . Diabetes mellitus without complication (HCC)   . History of kidney stones   . HTN (hypertension)   . Hyperglycemia   . Hypertension   . Obesity   . Restless leg syndrome   . Tobacco abuse     Past Surgical History:  Procedure Laterality Date  . ABDOMINAL HYSTERECTOMY    . CARDIAC CATHETERIZATION  2014  . CESAREAN SECTION    . CHOLECYSTECTOMY    . EXCISION MASS NECK     cyst  . FOOT MASS EXCISION    . hysteectomy --- unknown    . left foot surgery     removal of cyst  . NECK SURGERY    . TONSILLECTOMY      Family History  Adopted: Yes  Family history unknown: Yes    Social History   Socioeconomic History  . Marital status: Widowed    Spouse name: None  . Number of children: None  . Years of education: None  . Highest education level: None  Social Needs  . Financial resource strain: None  . Food insecurity - worry: None  . Food insecurity - inability: None  . Transportation needs - medical: None  . Transportation needs - non-medical: None  Occupational History  . None  Tobacco Use  . Smoking status: Former Smoker    Packs/day: 1.00    Years: 25.00    Pack years: 25.00    Types: Cigarettes    Start date: 11/13/1988    Last attempt to quit: 10/28/2016    Years since quitting: 0.3  . Smokeless tobacco: Never Used  Substance and Sexual Activity  . Alcohol use: Yes    Comment: For special occasions  . Drug use: No  . Sexual activity: No    Birth control/protection: Surgical  Other Topics Concern  . None  Social History Narrative   [facility] (11/18/16):   Originally from [place] but grew up in [place]. Cared for her husband until he passed. 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  Does have significant dust exposure. Has 3 dogs currently. Previously had a parakeet as a child. No mold exposure. Enjoys playing with her grandchildren.           Objective:   Physical Exam BP 122/72 (BP Location: Left Arm, Cuff Size: Normal)   Pulse 79   Ht 5\' 4"  (1.626 m)   Wt 202 lb 4 oz (91.7 kg)   SpO2 97%   BMI 34.72 kg/m   General:  Obese. Accompanied by daughter today. No acute distress.  Integument:  No bruising. Warm. Dry. Extremities:  No cyanosis or clubbing.  HEENT:  No nasal turbinate swelling. Moist venous membranes. No scleral icterus. Cardiovascular:  Regular rate. No edema. Regular rhythm.  Pulmonary:  Clear bilaterally to auscultation. Normal work of breathing on room air. Abdomen: Soft. Normal bowel sounds. Protuberant. Musculoskeletal:  Normal bulk and tone. No joint deformity or effusion appreciated. Neurological:  Cranial nerves 2-12 grossly in tact. No meningismus. Moving all 4 extremities equally.   PFT 11/25/16: FVC 2.27 L (63%) FEV1 1.86 L (65%) FEV1/FVC 0.82 FEF 25-75 1.94 L (68%) negative bronchodilator response TLC 3.26 L (64%) RV 71% ERV 37% DLCO corrected 83%  6MWT 01/27/17:  Walked 405 meters / Baseline Sat 99% on RA / Nadir Sat 95% on RA  SPLIT NIGHT SLEEP STUDY (01/20/17):  Overall AHI 1.2 events/hour. Lowest saturation 89%. Total PLMS 26. Snoring was noted.  EPWORTH SLEEPINESS SCALE (11/18/16):  15  IMAGING CT CHEST W/O 11/25/16 (previously reviewed by me):  Predominantly groundglass central nodule in right upper lobe. Similar to previous CT angiogram of the chest performed February 2017. No evidence of progression. No new nodules appreciated. No pleural effusion or thickening. No pericardial effusion. No pathologic mediastinal adenopathy.  CXR PA/LAT 11/11/16 (previously reviewed by me):  No parenchymal mass or opacity appreciated. No pleural effusion. Somewhat low lung volumes. Heart with borderline cardiomegaly. Mediastinum normal in  contour.  CARDIAC TTE (08/16/16):  LV normal in size with EF 60-65%. No regional wall motion abnormalities. Normal diastolic function. LA & RA normal in size. RV normal in size and function. No aortic stenosis or regurgitation. Aortic root normal in size. Trivial mitral regurgitation without stenosis. No pulmonic regurgitation. Trivial tricuspid regurgitation. No pericardial effusion.  LABS 11/18/16 IgE: 245 RAST panel: Multiple positives for cat, dog, grasses, mold, trees, etc.    Assessment & Plan:  46 y.o. female with multiple environmental sensitivities/allergens identify with previous serum testing. No symptomatic benefit from Singulair for [facility]. However, she continues to exhibit symptomatic relief utilizing albuterol in her nebulizer. We discussed trying alternative inhaler therapies versus going to a nebulizer regimen. She feels more comfortable with transitioning to a nebulizer regimen given the ease of use. We also discussed the need to avoid having her dogs sleep in her bedroom, even though the sleep on the floor and reportedly did not get on the bed. She has minimal to no symptoms from her chronic allergic rhinitis and reflux at this time. I instructed the patient to contact my office if she had questions or concerns before her next appointment.  1. Moderate, persistent asthma: Started patient on Pulmicort and Perforomist nebulized twice daily. Continuing albuterol nebulizer as needed. 2. Chronic seasonal allergic rhinitis: No significant symptoms. Holding on Singulair and additional medications. 3. GERD: No symptoms. Deferring further gastric acid suppression. 4. Right upper lobe nodule: Plan for repeat CT imaging July 2019. 5. Health maintenance: Status post Pneumovax January 2007. Patient plan for Pneumovax in January. Declines influenza vaccine. 6. Follow-up: Return to clinic in 3 months or sooner if needed.  Donna ChristenJennings E. Jamison NeighborNestor, M.D. [facility] Pager:   [phone] After 3pm or if no response, call [phone] 10:30 AM 03/13/17

## 2017-03-13 NOTE — Addendum Note (Signed)
Addended by: Etheleen MayhewOX, Carlesha Seiple C on: 03/13/2017 10:35 AM   Modules accepted: Orders

## 2017-03-13 NOTE — Patient Instructions (Addendum)
   Call my office if you have any new breathing problems or questions before your next appointment.  Continue using Albuterol in your nebulizer as needed.  I'm sending in prescriptions for Perforomist and Pulmicort to use in your nebulizer twice daily. Use the Perforomist first and don't mix the medications.  Remember to remove any dentures or partials you have before you use your Pulmicort. Remember to brush your teeth & tongue after you use it as well as rinse, gargle & spit to keep from getting thrush in your mouth or on your tongue (a white film).   We will see you back in 3 months or sooner if needed.

## 2017-03-21 ENCOUNTER — Encounter: Payer: Self-pay | Admitting: Pulmonary Disease

## 2017-03-22 ENCOUNTER — Telehealth: Payer: Self-pay | Admitting: Pulmonary Disease

## 2017-03-22 ENCOUNTER — Other Ambulatory Visit: Payer: Self-pay | Admitting: Pulmonary Disease

## 2017-03-22 DIAGNOSIS — J449 Chronic obstructive pulmonary disease, unspecified: Secondary | ICD-10-CM | POA: Diagnosis not present

## 2017-03-22 NOTE — Progress Notes (Signed)
Patient states she never received nebulizer machine and supplies. [facility]

## 2017-03-22 NOTE — Telephone Encounter (Signed)
Received a fax from pt's pharmacy.  Perforomist is not covered by pt's insurance. I called [employer] at [phone]. Pt's ID: [id number] K. Spoke with Patty. PA was initiated and approved through 03/17/2018. Pt's pharmacy has been made aware. Nothing further was needed.

## 2017-04-03 ENCOUNTER — Other Ambulatory Visit: Payer: Self-pay | Admitting: Family

## 2017-04-15 DIAGNOSIS — R079 Chest pain, unspecified: Secondary | ICD-10-CM | POA: Diagnosis not present

## 2017-04-15 DIAGNOSIS — R0602 Shortness of breath: Secondary | ICD-10-CM | POA: Diagnosis not present

## 2017-04-15 DIAGNOSIS — R05 Cough: Secondary | ICD-10-CM | POA: Diagnosis not present

## 2017-04-15 DIAGNOSIS — J441 Chronic obstructive pulmonary disease with (acute) exacerbation: Secondary | ICD-10-CM | POA: Diagnosis not present

## 2017-04-15 DIAGNOSIS — Z87891 Personal history of nicotine dependence: Secondary | ICD-10-CM | POA: Diagnosis not present

## 2017-04-16 DIAGNOSIS — R079 Chest pain, unspecified: Secondary | ICD-10-CM | POA: Diagnosis not present

## 2017-04-16 DIAGNOSIS — R05 Cough: Secondary | ICD-10-CM | POA: Diagnosis not present

## 2017-04-16 DIAGNOSIS — R0602 Shortness of breath: Secondary | ICD-10-CM | POA: Diagnosis not present

## 2017-04-27 ENCOUNTER — Encounter: Payer: Self-pay | Admitting: Family

## 2017-04-27 ENCOUNTER — Ambulatory Visit: Payer: Medicaid Other | Admitting: Family

## 2017-04-27 VITALS — BP 129/64 | HR 79 | Temp 97.3°F | Ht 64.0 in | Wt 203.6 lb

## 2017-04-27 DIAGNOSIS — Z09 Encounter for follow-up examination after completed treatment for conditions other than malignant neoplasm: Secondary | ICD-10-CM

## 2017-04-27 DIAGNOSIS — Z23 Encounter for immunization: Secondary | ICD-10-CM

## 2017-04-27 DIAGNOSIS — J441 Chronic obstructive pulmonary disease with (acute) exacerbation: Secondary | ICD-10-CM

## 2017-04-27 MED ORDER — BUDESONIDE-FORMOTEROL FUMARATE 160-4.5 MCG/ACT IN AERO: 2.0000 | INHALATION_SPRAY | Freq: Two times a day (BID) | RESPIRATORY_TRACT | 3 refills | Status: DC

## 2017-04-27 MED ORDER — PREDNISONE 10 MG (21) PO TBPK: ORAL_TABLET | ORAL | 0 refills | Status: DC

## 2017-04-27 MED ORDER — DOXYCYCLINE HYCLATE 100 MG PO TABS: 100.0000 mg | ORAL_TABLET | Freq: Two times a day (BID) | ORAL | 0 refills | Status: DC

## 2017-04-27 NOTE — Progress Notes (Signed)
Subjective:    Patient ID: Sherry Glass, female    DOB: [birth year], 46 y.o.   MRN: [id number]  Pt presents to the office today for hospital follow up. Pt went to the [facility] ED on 04/16/17 for SOB and was discharged on the 04/17/17. Pt was diagnosed with COPD exacerbation and discharged with prednisone and Symbicort inhaler.  Chest xray- Negative.   Pt states she continues to cough, but does feel better.   Hospital notes reviewed and will be scanned in.  Cough  This is a recurrent problem. The current episode started 1 to 4 weeks ago. The problem has been waxing and waning. The problem occurs every few minutes. The cough is non-productive. Associated symptoms include chills, myalgias, shortness of breath and wheezing. Pertinent negatives include no ear congestion, ear pain, fever, headaches or nasal congestion. The symptoms are aggravated by lying down. Risk factors for lung disease include smoking/tobacco exposure. She has tried rest, steroid inhaler and oral steroids for the symptoms. The treatment provided mild relief. Her past medical history is significant for COPD.        Review of Systems  Constitutional: Positive for chills. Negative for fever.  HENT: Negative for ear pain.   Respiratory: Positive for cough, shortness of breath and wheezing.   Musculoskeletal: Positive for myalgias.  Neurological: Negative for headaches.  All other systems reviewed and are negative.      Objective:   Physical Exam  Constitutional: She is oriented to person, place, and time. She appears well-developed and well-nourished. No distress.  HENT:  Head: Normocephalic and atraumatic.  Right Ear: External ear normal.  Left Ear: External ear normal.  Nose: Mucosal edema and rhinorrhea present.  Mouth/Throat: Oropharynx is clear and moist.  Eyes: Pupils are equal, round, and reactive to light.  Neck: Normal range of motion. Neck supple. No thyromegaly present.  Cardiovascular: Normal rate,  regular rhythm, normal heart sounds and intact distal pulses.  No murmur heard. Pulmonary/Chest: Effort normal. No respiratory distress. She has wheezes. She has rhonchi.  Abdominal: Soft. Bowel sounds are normal. She exhibits no distension. There is no tenderness.  Musculoskeletal: Normal range of motion. She exhibits no edema or tenderness.  Neurological: She is alert and oriented to person, place, and time. She has normal reflexes. No cranial nerve deficit.  Skin: Skin is warm and dry.  Psychiatric: She has a normal mood and affect. Her behavior is normal. Judgment and thought content normal.  Vitals reviewed.     BP 129/64   Pulse 79   Temp (!) 97.3 F (36.3 C) (Oral)   Ht _0  (1.626 m)   Wt 203 lb 9.6 oz (92.4 kg)   BMI 34.95 kg/m      Assessment & Plan:  1. COPD exacerbation ([place]) - Take meds as prescribed - Use a cool mist humidifier  -Use saline nose sprays frequently -Force fluids -For any cough or congestion  Use plain Mucinex- regular strength or max strength is fine -For fever or aces or pains- take tylenol or ibuprofen - BMP8+EGFR - CBC with Differential/Platelet - doxycycline (VIBRA-TABS) 100 MG tablet; Take 1 tablet (100 mg total) by mouth 2 (two) times daily.  Dispense: 20 tablet; Refill: 0 - predniSONE (STERAPRED UNI-PAK 21 TAB) 10 MG (21) TBPK tablet; Use as directed  Dispense: 21 tablet; Refill: 0 - budesonide-formoterol (SYMBICORT) 160-4.5 MCG/ACT inhaler; Inhale 2 puffs into the lungs 2 (two) times daily.  Dispense: 1 Inhaler; Refill: 3  2. Hospital discharge  follow-up - BMP8+EGFR - CBC with Differential/Platelet  Strict low carb diet Will start her on doxycycline and prednisone today Continue Symbicort inhaler BID every day!!!!! RTO if symptoms do not improve or worsen, keep chronic follow up  Evelina Dun, [name]

## 2017-04-27 NOTE — Patient Instructions (Signed)

## 2017-04-28 LAB — CBC WITH DIFFERENTIAL/PLATELET
Basophils Absolute: 0 10*3/uL (ref 0.0–0.2)
Basos: 0 %
EOS (ABSOLUTE): 0.1 10*3/uL (ref 0.0–0.4)
Eos: 2 %
Hematocrit: 41.4 % (ref 34.0–46.6)
Hemoglobin: 14.2 g/dL (ref 11.1–15.9)
Immature Grans (Abs): 0 10*3/uL (ref 0.0–0.1)
Immature Granulocytes: 0 %
Lymphocytes Absolute: 3.5 10*3/uL — ABNORMAL HIGH (ref 0.7–3.1)
Lymphs: 43 %
MCH: 31.2 pg (ref 26.6–33.0)
MCHC: 34.3 g/dL (ref 31.5–35.7)
MCV: 91 fL (ref 79–97)
Monocytes Absolute: 0.7 10*3/uL (ref 0.1–0.9)
Monocytes: 8 %
Neutrophils Absolute: 3.8 10*3/uL (ref 1.4–7.0)
Neutrophils: 47 %
Platelets: 254 10*3/uL (ref 150–379)
RBC: 4.55 x10E6/uL (ref 3.77–5.28)
RDW: 14.4 % (ref 12.3–15.4)
WBC: 8.2 10*3/uL (ref 3.4–10.8)

## 2017-04-28 LAB — BMP8+EGFR
BUN/Creatinine Ratio: 19 (ref 9–23)
BUN: 11 mg/dL (ref 6–24)
CO2: 20 mmol/L (ref 20–29)
Calcium: 9.7 mg/dL (ref 8.7–10.2)
Chloride: 105 mmol/L (ref 96–106)
Creatinine, Ser: 0.59 mg/dL (ref 0.57–1.00)
GFR calc Af Amer: 125 mL/min/{1.73_m2} (ref >59)
GFR calc non Af Amer: 109 mL/min/{1.73_m2} (ref >59)
Glucose: 204 mg/dL — ABNORMAL HIGH (ref 65–99)
Potassium: 4.8 mmol/L (ref 3.5–5.2)
Sodium: 141 mmol/L (ref 134–144)

## 2017-05-04 ENCOUNTER — Other Ambulatory Visit: Payer: Self-pay | Admitting: Family

## 2017-05-22 LAB — HM DIABETES EYE EXAM

## 2017-05-25 ENCOUNTER — Other Ambulatory Visit: Payer: Self-pay | Admitting: *Deleted

## 2017-05-25 MED ORDER — MONTELUKAST SODIUM 10 MG PO TABS: 10.0000 mg | ORAL_TABLET | Freq: Every day | ORAL | 5 refills | Status: DC

## 2017-05-29 ENCOUNTER — Ambulatory Visit: Payer: Medicaid Other | Admitting: Emergency Medicine

## 2017-05-29 ENCOUNTER — Encounter: Payer: Self-pay | Admitting: Emergency Medicine

## 2017-05-29 DIAGNOSIS — J302 Other seasonal allergic rhinitis: Secondary | ICD-10-CM | POA: Diagnosis not present

## 2017-05-29 DIAGNOSIS — R911 Solitary pulmonary nodule: Secondary | ICD-10-CM | POA: Diagnosis not present

## 2017-05-29 DIAGNOSIS — G4734 Idiopathic sleep related nonobstructive alveolar hypoventilation: Secondary | ICD-10-CM | POA: Insufficient documentation

## 2017-05-29 DIAGNOSIS — I10 Essential (primary) hypertension: Secondary | ICD-10-CM

## 2017-05-29 DIAGNOSIS — K219 Gastro-esophageal reflux disease without esophagitis: Secondary | ICD-10-CM

## 2017-05-29 NOTE — Patient Instructions (Addendum)
Please continue your Perforomist and Pulmicort nebulizers twice a day as you have been taking them Keep albuterol nebulizer available to use up to every 4 hours if needed for shortness of breath, wheezing, chest tightness. Continue oxygen 2 L/min at night while sleeping.  Depending on your symptoms we may decide to repeat your sleep study at some point in the future. Continue Singulair once a day. Please speak to your primary physician about possibly changing your lisinopril to an alternative blood pressure medication.  Lisinopril will contribute to cough. We will repeat your CT scan of the chest without contrast in July 2019 to follow your right upper lobe pulmonary nodule. Follow with Dr Delton CoombesByrum in July after your CT scan of the chest to review the results together.

## 2017-05-29 NOTE — Assessment & Plan Note (Signed)
Currently off omeprazole and a symptomatic.  I will move this medication from her medicine list.

## 2017-05-29 NOTE — Assessment & Plan Note (Signed)
Recent flare, now improved after steroids.  She is on Perforomist and Pulmicort nebulizer, using albuterol nebs rarely.

## 2017-05-29 NOTE — Assessment & Plan Note (Signed)
Not currently on Zyrtec.  She remains on Singulair.

## 2017-05-29 NOTE — Assessment & Plan Note (Signed)
Based on sleep study 12/2016 and also confirmed on her recent hospitalization.  She has been started on 2 L/min.  Depending on symptoms we may decide to repeat her sleep study.  The original study was compromised due to sleep duration.  Consider a home sleep study in the future

## 2017-05-29 NOTE — Progress Notes (Signed)
Subjective:    Patient ID: Sherry Glass, female    DOB: 1970-05-04, 47 y.o.   MRN: [id number]  HPI 47 year old woman former smoker (25 pack years), with a history of diabetes, hypertension, obesity, allergic rhinitis, moderate persistent asthma formally followed here by Dr. Jamison Neighbor.  She has been managed most recently on Pulmicort plus Perforomist nebulized.  She has a right upper lobe groundglass pulmonary nodule that has been stable on serial CT scans most recently in July 2018.  She is scheduled for repeat scan in July 2019.  Sleep study 12/2016 with poor sleep time, but no observed OSA.   She reports that she doesn't feel any real change on the nebulized meds. She does still have exertional dyspnea. She was hospitalized for cough and dyspnea earlier this month, treated with steroids, better now. That was her first flare for many months.  Uses albuterol rarely.  She is on singulair, not on zyrtec. Not on omeprazole. She is on lisinopril.    Review of Systems  Past Medical History:  Diagnosis Date  . Allergic rhinitis   . Anxiety and depression   . Cataract   . Chest pain syndrome   . COPD (chronic obstructive pulmonary disease) (HCC)   . Depression   . Diabetes mellitus without complication (HCC)   . History of kidney stones   . HTN (hypertension)   . Hyperglycemia   . Hypertension   . Obesity   . Restless leg syndrome   . Tobacco abuse      Family History  Adopted: Yes  Family history unknown: Yes     Social History   Socioeconomic History  . Marital status: Widowed    Spouse name: Not on file  . Number of children: Not on file  . Years of education: Not on file  . Highest education level: Not on file  Social Needs  . Financial resource strain: Not on file  . Food insecurity - worry: Not on file  . Food insecurity - inability: Not on file  . Transportation needs - medical: Not on file  . Transportation needs - non-medical: Not on file  Occupational History  . Not  on file  Tobacco Use  . Smoking status: Former Smoker    Packs/day: 1.00    Years: 25.00    Pack years: 25.00    Types: Cigarettes    Start date: 11/13/1988    Last attempt to quit: 10/28/2016    Years since quitting: 0.5  . Smokeless tobacco: Never Used  Substance and Sexual Activity  . Alcohol use: Yes    Comment: For special occasions  . Drug use: No  . Sexual activity: No    Birth control/protection: Surgical  Other Topics Concern  . Not on file  Social History Narrative   [facility] (11/18/16):   Originally from [place] but grew up in [place]. Cared for her husband until he passed. 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 Does have significant dust exposure. Has 3 dogs currently. Previously had a parakeet as a child. No mold exposure. Enjoys playing with her grandchildren.          Allergies  Allergen Reactions  . Dilaudid [Hydromorphone]     Itching and vomitting  . Hydromorphone   . Valproic Acid And Related      Outpatient Medications Prior to Visit  Medication Sig Dispense Refill  . ACCU-CHEK FASTCLIX LANCETS MISC USE TO check blood glucose twice daily 102  each 2  . ACCU-CHEK GUIDE test strip USE TO check blood glucose twice daily 100 each 2  . albuterol (PROVENTIL) (2.5 MG/3ML) 0.083% nebulizer solution Use 1 vial (3 mL) via nebulizer every 6 hours as needed for wheezing or shortness of breath 150 mL 5  . aspirin EC 81 MG tablet Take one po qd 90 tablet 1  . budesonide (PULMICORT) 0.5 MG/2ML nebulizer solution Take 2 mLs (0.5 mg total) 2 (two) times daily by nebulization. 120 mL 3  . CINNAMON PO Take 1,000 mg by mouth 2 (two) times daily.    . cyclobenzaprine (FLEXERIL) 10 MG tablet Take 1 Tablet by mouth 3 times a day as needed for muscle spasms 60 tablet 3  . doxycycline (VIBRA-TABS) 100 MG tablet Take 1 tablet (100 mg total) by mouth 2 (two) times daily. 20 tablet 0  . escitalopram (LEXAPRO) 20 MG tablet Take 1 Tablet  by mouth once daily 30 tablet 1  . formoterol (PERFOROMIST) 20 MCG/2ML nebulizer solution Take 2 mLs (20 mcg total) 2 (two) times daily by nebulization. 120 mL 3  . JANUMET XR [phone] MG TB24 TAKE 1 TABLET ONCE A DAY 30 tablet 0  . JARDIANCE 10 MG TABS tablet Take 1 Tablet by mouth once daily 90 tablet 0  . lisinopril (PRINIVIL,ZESTRIL) 10 MG tablet Take 1 Tablet by mouth once daily FOR kidney protection AND blood pressure 34 tablet 3  . montelukast (SINGULAIR) 10 MG tablet Take 1 tablet (10 mg total) by mouth at bedtime. 30 tablet 5  . nitroGLYCERIN (NITROSTAT) 0.4 MG SL tablet Place 1 tablet (0.4 mg total) under the tongue every 5 (five) minutes as needed for chest pain. 25 tablet 3  . pravastatin (PRAVACHOL) 20 MG tablet Take 1 tablet (20 mg total) by mouth daily. 90 tablet 3  . predniSONE (STERAPRED UNI-PAK 21 TAB) 10 MG (21) TBPK tablet Use as directed 21 tablet 0  . propranolol (INDERAL) 20 MG tablet Take 1 Tablet by mouth 2 times a day 60 tablet 0  . Vitamin D, Ergocalciferol, (DRISDOL) 50000 units CAPS capsule Take 1 capsule (50,000 Units total) by mouth every 7 (seven) days. 12 capsule 3  . budesonide-formoterol (SYMBICORT) 160-4.5 MCG/ACT inhaler Inhale 2 puffs into the lungs 2 (two) times daily. 1 Inhaler 3  . cetirizine (ZYRTEC) 10 MG tablet Take 1 Tablet by mouth once daily 90 tablet 0  . omeprazole (PRILOSEC) 40 MG capsule Take 1 capsule (40 mg total) by mouth daily. 30 capsule 1   No facility-administered medications prior to visit.         Objective:   Physical Exam Vitals:   05/29/17 1605 05/29/17 1606  BP:  132/82  Pulse:  (!) 101  SpO2:  96%  Weight: 202 lb (91.6 kg)   Height: 5\' 3"  (1.6 m)    Gen: Pleasant, well-nourished, in no distress,  normal affect  ENT: No lesions,  mouth clear,  oropharynx clear, no postnasal drip  Neck: No JVD, no TMG, no carotid bruits  Lungs: No use of accessory muscles, no dullness to percussion, clear without rales or  rhonchi  Cardiovascular: RRR, heart sounds normal, no murmur or gallops, no peripheral edema  Musculoskeletal: No deformities, no cyanosis or clubbing  Neuro: alert, non focal  Skin: Warm, no lesions or rashes    CT scan chest 11/25/16 --  COMPARISON:  None.  FINDINGS: Cardiovascular: Limited evaluation in the absence of intravenous contrast. Trace calcifications along the course of the left anterior descending  coronary artery. The heart is normal in size. No pericardial effusion.  Mediastinum/Nodes: Unremarkable CT appearance of the thyroid gland. No suspicious mediastinal or hilar adenopathy. No soft tissue mediastinal mass. The thoracic esophagus is unremarkable.  Lungs/Pleura: Patchy focus of ground-glass attenuation airspace opacity in the right upper lung measures 1.8 x 0.8 cm in greatest dimension. Similar findings were present on the prior CT scan from February of 2017. No significant interval change. The lungs are otherwise clear. No significant emphysematous changes or bronchial wall thickening.  Upper Abdomen: The gallbladder is surgically absent.  Musculoskeletal: No acute fracture or aggressive appearing lytic or blastic osseous lesion.  IMPRESSION: 1. Patchy focus of ground-glass attenuation airspace opacity in the right upper lung measuring approximately 1.8 x 0.8 cm in greatest dimensions. Similar findings were present on the prior CT scan from February of 2017. As such, this may simply represent a region of residual pleuroparenchymal scarring. However, low grade indolent adenocarcinoma can have a similar appearance and continued follow-up is recommended. Repeat CT is recommended every 2 years until 5 years of stability has been established. This recommendation follows the consensus statement: Guidelines for Management of Incidental Pulmonary Nodules Detected on CT Images:From the Fleischner Society 2017; published online before print  (10.1148/radiol.1610960454). 2. Trace coronary artery calcifications. Please note that although the presence of coronary artery calcium documents the presence of coronary artery disease, the severity of this disease and any potential stenosis cannot be assessed on this non-gated CT examination. Assessment for potential risk factor modification, dietary therapy or pharmacologic therapy may be warranted, if clinically indicated. 3. Surgical changes of prior cholecystectomy.     Assessment & Plan:  Moderate persistent asthma Recent flare, now improved after steroids.  She is on Perforomist and Pulmicort nebulizer, using albuterol nebs rarely.   Nocturnal hypoxemia Based on sleep study 12/2016 and also confirmed on her recent hospitalization.  She has been started on 2 L/min.  Depending on symptoms we may decide to repeat her sleep study.  The original study was compromised due to sleep duration.  Consider a home sleep study in the future  Chronic seasonal allergic rhinitis Not currently on Zyrtec.  She remains on Singulair.  HTN (hypertension) Believe that she needs to come off the lisinopril.  Losartan would also be a compromised choice given its connection to cough.  She likely needs an alternative ARB.  GERD (gastroesophageal reflux disease) Currently off omeprazole and a symptomatic.  I will move this medication from her medicine list.  Levy Pupa, MD, PhD 05/29/2017, 4:35 PM  Pulmonary [facility] Care [phone] or if no answer [phone]

## 2017-05-29 NOTE — Assessment & Plan Note (Signed)
Believe that she needs to come off the lisinopril.  Losartan would also be a compromised choice given its connection to cough.  She likely needs an alternative ARB.

## 2017-05-31 ENCOUNTER — Encounter: Payer: Self-pay | Admitting: Family

## 2017-05-31 MED ORDER — OLMESARTAN MEDOXOMIL 5 MG PO TABS: 5.0000 mg | ORAL_TABLET | Freq: Every day | ORAL | 0 refills | Status: DC

## 2017-06-01 ENCOUNTER — Other Ambulatory Visit: Payer: Self-pay | Admitting: *Deleted

## 2017-06-01 ENCOUNTER — Telehealth: Payer: Self-pay

## 2017-06-01 MED ORDER — LOSARTAN POTASSIUM 25 MG PO TABS: 25.0000 mg | ORAL_TABLET | Freq: Every day | ORAL | 1 refills | Status: DC

## 2017-06-01 MED ORDER — OLMESARTAN MEDOXOMIL 5 MG PO TABS: 5.0000 mg | ORAL_TABLET | Freq: Every day | ORAL | 0 refills | Status: DC

## 2017-06-01 NOTE — Telephone Encounter (Signed)
Pt aware of change.

## 2017-06-01 NOTE — Telephone Encounter (Signed)
Benicar changed to Losartan per insurance.

## 2017-06-01 NOTE — Telephone Encounter (Signed)
Medicaid non preferred Olmesartan medoxomil  Must try and fail Losartan and Brand Diovan

## 2017-06-05 ENCOUNTER — Other Ambulatory Visit: Payer: Self-pay | Admitting: Family

## 2017-06-05 DIAGNOSIS — E1165 Type 2 diabetes mellitus with hyperglycemia: Secondary | ICD-10-CM

## 2017-07-03 ENCOUNTER — Other Ambulatory Visit: Payer: Self-pay | Admitting: Family

## 2017-07-03 DIAGNOSIS — E1165 Type 2 diabetes mellitus with hyperglycemia: Secondary | ICD-10-CM

## 2017-07-03 DIAGNOSIS — E785 Hyperlipidemia, unspecified: Secondary | ICD-10-CM

## 2017-07-04 ENCOUNTER — Other Ambulatory Visit: Payer: Self-pay | Admitting: *Deleted

## 2017-07-04 DIAGNOSIS — F411 Generalized anxiety disorder: Secondary | ICD-10-CM

## 2017-07-04 MED ORDER — ACCU-CHEK FASTCLIX LANCETS MISC: 2 refills | Status: DC

## 2017-07-04 MED ORDER — GLUCOSE BLOOD VI STRP: ORAL_STRIP | 2 refills | Status: DC

## 2017-07-04 MED ORDER — ESCITALOPRAM OXALATE 20 MG PO TABS: 20.0000 mg | ORAL_TABLET | Freq: Every day | ORAL | 0 refills | Status: DC

## 2017-07-04 NOTE — Addendum Note (Signed)
Addended by: Julious PayerHOLT, CATHERINE D on: 07/04/2017 04:03 PM   Modules accepted: Orders

## 2017-07-27 ENCOUNTER — Other Ambulatory Visit: Payer: Self-pay | Admitting: Family

## 2017-07-28 ENCOUNTER — Encounter: Payer: Self-pay | Admitting: Emergency Medicine

## 2017-08-02 ENCOUNTER — Ambulatory Visit: Payer: Self-pay | Admitting: Emergency Medicine

## 2017-08-02 ENCOUNTER — Encounter: Payer: Self-pay | Admitting: Emergency Medicine

## 2017-08-02 ENCOUNTER — Other Ambulatory Visit: Payer: Self-pay | Admitting: Family

## 2017-08-02 DIAGNOSIS — J449 Chronic obstructive pulmonary disease, unspecified: Secondary | ICD-10-CM

## 2017-08-02 MED ORDER — GLYCOPYRROLATE-FORMOTEROL 9-4.8 MCG/ACT IN AERO: 2.0000 | INHALATION_SPRAY | Freq: Two times a day (BID) | RESPIRATORY_TRACT | 0 refills | Status: DC

## 2017-08-02 NOTE — Progress Notes (Signed)
Subjective:    Patient ID: Sherry Glass, female    DOB: [birth year], 47 y.o.   MRN: [id number]  HPI 47 year old woman former smoker (25 pack years), with a history of diabetes, hypertension, obesity, allergic rhinitis, moderate persistent asthma formally followed here by Dr. Jamison Neighbor.  She has been managed most recently on Pulmicort plus Perforomist nebulized.  She has a right upper lobe groundglass pulmonary nodule that has been stable on serial CT scans most recently in July 2018.  She is scheduled for repeat scan in July 2019.  Sleep study 12/2016 with poor sleep time, but no observed OSA.   She reports that she doesn't feel any real change on the nebulized meds. She does still have exertional dyspnea. She was hospitalized for cough and dyspnea earlier this month, treated with steroids, better now. That was her first flare for many months.  Uses albuterol rarely.  She is on singulair, not on zyrtec. Not on omeprazole. She is on lisinopril.   Acute 08/01/17 --follow-up visit for patient with a history of COPD, allergic rhinitis, hypertension (formerly on ACE inhibitor), nocturnal hypoxemia without documented sleep apnea.  Also with pulmonary nodules that we are following by serial CT scan as above.  She presents today for an acute visit complaining of about 1 month of progressive dyspnea, happens in the am and with activity. Some improvement with albuterol HFA. Her cough is better - lisinopril was changed since last time. Her medicaid is being changed to family plan, she needs to do the paperwork to get her meds covered again.    Review of Systems  Past Medical History:  Diagnosis Date  . Allergic rhinitis   . Anxiety and depression   . Cataract   . Chest pain syndrome   . COPD (chronic obstructive pulmonary disease) (HCC)   . Depression   . Diabetes mellitus without complication (HCC)   . History of kidney stones   . HTN (hypertension)   . Hyperglycemia   . Hypertension   . Obesity   .  Restless leg syndrome   . Tobacco abuse      Family History  Adopted: Yes  Family history unknown: Yes     Social History   Socioeconomic History  . Marital status: Widowed    Spouse name: Not on file  . Number of children: Not on file  . Years of education: Not on file  . Highest education level: Not on file  Occupational History  . Not on file  Social Needs  . Financial resource strain: Not on file  . Food insecurity:    Worry: Not on file    Inability: Not on file  . Transportation needs:    Medical: Not on file    Non-medical: Not on file  Tobacco Use  . Smoking status: Former Smoker    Packs/day: 1.00    Years: 25.00    Pack years: 25.00    Types: Cigarettes    Start date: 11/13/1988    Last attempt to quit: 10/28/2016    Years since quitting: 0.7  . Smokeless tobacco: Never Used  Substance and Sexual Activity  . Alcohol use: Yes    Comment: For special occasions  . Drug use: No  . Sexual activity: Never    Birth control/protection: Surgical  Lifestyle  . Physical activity:    Days per week: Not on file    Minutes per session: Not on file  . Stress: Not on file  Relationships  .  Social connections:    Talks on phone: Not on file    Gets together: Not on file    Attends religious service: Not on file    Active member of club or organization: Not on file    Attends meetings of clubs or organizations: Not on file    Relationship status: Not on file  . Intimate partner violence:    Fear of current or ex partner: Not on file    Emotionally abused: Not on file    Physically abused: Not on file    Forced sexual activity: Not on file  Other Topics Concern  . Not on file  Social History Narrative   [facility] (11/18/16):   Originally from [place] but grew up in [place]. Cared for her husband until he passed. 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 Does have significant dust exposure. Has 3 dogs currently.  Previously had a parakeet as a child. No mold exposure. Enjoys playing with her grandchildren.          Allergies  Allergen Reactions  . Dilaudid [Hydromorphone]     Itching and vomitting  . Hydromorphone   . Valproic Acid And Related      Outpatient Medications Prior to Visit  Medication Sig Dispense Refill  . ACCU-CHEK FASTCLIX LANCETS MISC USE TO check blood glucose twice daily 102 each 2  . albuterol (PROVENTIL) (2.5 MG/3ML) 0.083% nebulizer solution Use 1 vial (3 mL) via nebulizer every 6 hours as needed for wheezing or shortness of breath 150 mL 5  . aspirin EC 81 MG tablet Take one po qd 90 tablet 1  . budesonide (PULMICORT) 0.5 MG/2ML nebulizer solution Take 2 mLs (0.5 mg total) 2 (two) times daily by nebulization. 120 mL 3  . CINNAMON PO Take 1,000 mg by mouth 2 (two) times daily.    . cyclobenzaprine (FLEXERIL) 10 MG tablet Take 1 Tablet by mouth 3 times a day as needed for muscle spasms 60 tablet 3  . escitalopram (LEXAPRO) 20 MG tablet Take 1 tablet (20 mg total) by mouth daily. 30 tablet 0  . formoterol (PERFOROMIST) 20 MCG/2ML nebulizer solution Take 2 mLs (20 mcg total) 2 (two) times daily by nebulization. 120 mL 3  . glucose blood (ACCU-CHEK GUIDE) test strip USE TO check blood glucose twice daily 100 each 2  . JANUMET XR [phone] MG TB24 TAKE 1 TABLET ONCE A DAY. NEED TO BE SEEN FOR REFILL 30 tablet 0  . JARDIANCE 10 MG TABS tablet TAKE 1 TABLET ONCE A DAY. NEED TO BE SEEN FOR REFILLS 30 tablet 0  . losartan (COZAAR) 25 MG tablet TAKE 1 TABLET DAILY 30 tablet 1  . montelukast (SINGULAIR) 10 MG tablet Take 1 tablet (10 mg total) by mouth at bedtime. 30 tablet 5  . nitroGLYCERIN (NITROSTAT) 0.4 MG SL tablet Place 1 tablet (0.4 mg total) under the tongue every 5 (five) minutes as needed for chest pain. 25 tablet 3  . pravastatin (PRAVACHOL) 20 MG tablet TAKE 1 TABLET ONCE A DAY 30 tablet 2  . predniSONE (STERAPRED UNI-PAK 21 TAB) 10 MG (21) TBPK tablet Use as directed 21  tablet 0  . Vitamin D, Ergocalciferol, (DRISDOL) 50000 units CAPS capsule Take 1 capsule (50,000 Units total) by mouth every 7 (seven) days. 12 capsule 3  . doxycycline (VIBRA-TABS) 100 MG tablet Take 1 tablet (100 mg total) by mouth 2 (two) times daily. 20 tablet 0  . propranolol (INDERAL) 20 MG  tablet Take 1 Tablet by mouth 2 times a day 60 tablet 0   No facility-administered medications prior to visit.         Objective:   Physical Exam Vitals:   08/02/17 1520  BP: 124/82  Pulse: 95  SpO2: 95%  Weight: 204 lb (92.5 kg)  Height: 5\' 3"  (1.6 m)   Gen: Pleasant, well-nourished, in no distress,  normal affect  ENT: No lesions,  mouth clear,  oropharynx clear, no postnasal drip  Neck: No JVD, no TMG, no carotid bruits  Lungs: No use of accessory muscles, no dullness to percussion, clear without rales or rhonchi  Cardiovascular: RRR, heart sounds normal, no murmur or gallops, no peripheral edema  Musculoskeletal: No deformities, no cyanosis or clubbing  Neuro: alert, non focal  Skin: Warm, no lesions or rashes    CT scan chest 11/25/16 --  COMPARISON:  None.  FINDINGS: Cardiovascular: Limited evaluation in the absence of intravenous contrast. Trace calcifications along the course of the left anterior descending coronary artery. The heart is normal in size. No pericardial effusion.  Mediastinum/Nodes: Unremarkable CT appearance of the thyroid gland. No suspicious mediastinal or hilar adenopathy. No soft tissue mediastinal mass. The thoracic esophagus is unremarkable.  Lungs/Pleura: Patchy focus of ground-glass attenuation airspace opacity in the right upper lung measures 1.8 x 0.8 cm in greatest dimension. Similar findings were present on the prior CT scan from February of 2017. No significant interval change. The lungs are otherwise clear. No significant emphysematous changes or bronchial wall thickening.  Upper Abdomen: The gallbladder is surgically  absent.  Musculoskeletal: No acute fracture or aggressive appearing lytic or blastic osseous lesion.  IMPRESSION: 1. Patchy focus of ground-glass attenuation airspace opacity in the right upper lung measuring approximately 1.8 x 0.8 cm in greatest dimensions. Similar findings were present on the prior CT scan from February of 2017. As such, this may simply represent a region of residual pleuroparenchymal scarring. However, low grade indolent adenocarcinoma can have a similar appearance and continued follow-up is recommended. Repeat CT is recommended every 2 years until 5 years of stability has been established. This recommendation follows the consensus statement: Guidelines for Management of Incidental Pulmonary Nodules Detected on CT Images:From the Fleischner Society 2017; published online before print (10.1148/radiol.[id number]). 2. Trace coronary artery calcifications. Please note that although the presence of coronary artery calcium documents the presence of coronary artery disease, the severity of this disease and any potential stenosis cannot be assessed on this non-gated CT examination. Assessment for potential risk factor modification, dietary therapy or pharmacologic therapy may be warranted, if clinically indicated. 3. Surgical changes of prior cholecystectomy.     Assessment & Plan:  COPD (chronic obstructive pulmonary disease) (HCC) Progressive dyspnea over the last month or so.  This is mainly with exertion.  Her Perforomist and budesonide do not appear to be adequate and she may benefit from addition of an LAMA.  Complicating this is that should her Medicaid is changing to a family plan and she is going to need to petition to get her medications covered again.  She is going to work on this I will try changing her to. Bevespi to see if she gets more benefit.  If so then we will continue.  She will likely need to be managed with samples until her Medicaid comes  through.   Levy Pupaobert Lidiya Reise, MD, PhD 08/02/2017, 3:50 PM [facility] [phone] or if no answer [phone]

## 2017-08-02 NOTE — Patient Instructions (Signed)
Please stop Perforomist and budesonide nebulizers at this time We will start Bevespi 2 puffs twice a day until next visit to see if you get more benefit Complete your Medicaid paperwork so that you can get medication coverage again. Keep albuterol available to use 2 puffs if needed for shortness of breath, chest tightness, wheezing. Follow with Dr Delton CoombesByrum in 1 month or next available.

## 2017-08-02 NOTE — Assessment & Plan Note (Signed)
Progressive dyspnea over the last month or so.  This is mainly with exertion.  Her Perforomist and budesonide do not appear to be adequate and she may benefit from addition of an LAMA.  Complicating this is that should her Medicaid is changing to a family plan and she is going to need to petition to get her medications covered again.  She is going to work on this I will try changing her to. Bevespi to see if she gets more benefit.  If so then we will continue.  She will likely need to be managed with samples until her Medicaid comes through.

## 2017-08-03 MED ORDER — GLIMEPIRIDE 2 MG PO TABS: 2.0000 mg | ORAL_TABLET | Freq: Every day | ORAL | 1 refills | Status: DC

## 2017-08-03 MED ORDER — METFORMIN HCL 1000 MG PO TABS: 1000.0000 mg | ORAL_TABLET | Freq: Two times a day (BID) | ORAL | 3 refills | Status: DC

## 2017-08-03 NOTE — Addendum Note (Signed)
Addended by: Jannifer RodneyHAWKS, Kaleia Longhi A on: 08/03/2017 02:14 PM   Modules accepted: Orders

## 2017-08-03 NOTE — Telephone Encounter (Signed)
Where patient only have medicaid family planning they will not cover any medications. She has to pay cash. She can not afford janumet or jardiance. Please advise

## 2017-08-03 NOTE — Telephone Encounter (Signed)
We will stop Janumet XR and Jardiance and start metformin 1000 mg BID and glimepiride 2 mg daily. Need to keep follow up appts.

## 2017-08-03 NOTE — Telephone Encounter (Signed)
Patient aware and verbalizes understanding. 

## 2017-08-03 NOTE — Telephone Encounter (Signed)
What medications are not covered? Is it her deductible? I have not received any prior authorizations

## 2017-08-15 ENCOUNTER — Ambulatory Visit: Payer: Medicaid Other | Admitting: Family

## 2017-08-15 ENCOUNTER — Encounter: Payer: Self-pay | Admitting: Family

## 2017-08-15 VITALS — BP 130/75 | HR 102 | Temp 97.4°F | Ht 63.0 in | Wt 207.0 lb

## 2017-08-15 DIAGNOSIS — J449 Chronic obstructive pulmonary disease, unspecified: Secondary | ICD-10-CM | POA: Diagnosis not present

## 2017-08-15 DIAGNOSIS — I152 Hypertension secondary to endocrine disorders: Secondary | ICD-10-CM

## 2017-08-15 DIAGNOSIS — I10 Essential (primary) hypertension: Secondary | ICD-10-CM

## 2017-08-15 DIAGNOSIS — E1169 Type 2 diabetes mellitus with other specified complication: Secondary | ICD-10-CM

## 2017-08-15 DIAGNOSIS — J984 Other disorders of lung: Secondary | ICD-10-CM | POA: Diagnosis not present

## 2017-08-15 DIAGNOSIS — E1165 Type 2 diabetes mellitus with hyperglycemia: Secondary | ICD-10-CM

## 2017-08-15 DIAGNOSIS — M797 Fibromyalgia: Secondary | ICD-10-CM

## 2017-08-15 DIAGNOSIS — F331 Major depressive disorder, recurrent, moderate: Secondary | ICD-10-CM | POA: Diagnosis not present

## 2017-08-15 DIAGNOSIS — J4540 Moderate persistent asthma, uncomplicated: Secondary | ICD-10-CM

## 2017-08-15 DIAGNOSIS — E1159 Type 2 diabetes mellitus with other circulatory complications: Secondary | ICD-10-CM

## 2017-08-15 DIAGNOSIS — E785 Hyperlipidemia, unspecified: Secondary | ICD-10-CM | POA: Diagnosis not present

## 2017-08-15 DIAGNOSIS — E0590 Thyrotoxicosis, unspecified without thyrotoxic crisis or storm: Secondary | ICD-10-CM | POA: Diagnosis not present

## 2017-08-15 LAB — BAYER DCA HB A1C WAIVED: HB A1C (BAYER DCA - WAIVED): 6.9 % (ref <7.0)

## 2017-08-15 NOTE — Patient Instructions (Signed)
Diabetes Mellitus and Nutrition When you have diabetes (diabetes mellitus), it is very important to have healthy eating habits because your blood sugar (glucose) levels are greatly affected by what you eat and drink. Eating healthy foods in the appropriate amounts, at about the same times every day, can help you:  Control your blood glucose.  Lower your risk of heart disease.  Improve your blood pressure.  Reach or maintain a healthy weight.  Every person with diabetes is different, and each person has different needs for a meal plan. Your health care provider may recommend that you work with a diet and nutrition specialist (dietitian) to make a meal plan that is best for you. Your meal plan may vary depending on factors such as:  The calories you need.  The medicines you take.  Your weight.  Your blood glucose, blood pressure, and cholesterol levels.  Your activity level.  Other health conditions you have, such as heart or kidney disease.  How do carbohydrates affect me? Carbohydrates affect your blood glucose level more than any other type of food. Eating carbohydrates naturally increases the amount of glucose in your blood. Carbohydrate counting is a method for keeping track of how many carbohydrates you eat. Counting carbohydrates is important to keep your blood glucose at a healthy level, especially if you use insulin or take certain oral diabetes medicines. It is important to know how many carbohydrates you can safely have in each meal. This is different for every person. Your dietitian can help you calculate how many carbohydrates you should have at each meal and for snack. Foods that contain carbohydrates include:  Bread, cereal, rice, pasta, and crackers.  Potatoes and corn.  Peas, beans, and lentils.  Milk and yogurt.  Fruit and juice.  Desserts, such as cakes, cookies, ice cream, and candy.  How does alcohol affect me? Alcohol can cause a sudden decrease in blood  glucose (hypoglycemia), especially if you use insulin or take certain oral diabetes medicines. Hypoglycemia can be a life-threatening condition. Symptoms of hypoglycemia (sleepiness, dizziness, and confusion) are similar to symptoms of having too much alcohol. If your health care provider says that alcohol is safe for you, follow these guidelines:  Limit alcohol intake to no more than 1 drink per day for nonpregnant women and 2 drinks per day for men. One drink equals 12 oz of beer, 5 oz of wine, or 1 oz of hard liquor.  Do not drink on an empty stomach.  Keep yourself hydrated with water, diet soda, or unsweetened iced tea.  Keep in mind that regular soda, juice, and other mixers may contain a lot of sugar and must be counted as carbohydrates.  What are tips for following this plan? Reading food labels  Start by checking the serving size on the label. The amount of calories, carbohydrates, fats, and other nutrients listed on the label are based on one serving of the food. Many foods contain more than one serving per package.  Check the total grams (g) of carbohydrates in one serving. You can calculate the number of servings of carbohydrates in one serving by dividing the total carbohydrates by 15. For example, if a food has 30 g of total carbohydrates, it would be equal to 2 servings of carbohydrates.  Check the number of grams (g) of saturated and trans fats in one serving. Choose foods that have low or no amount of these fats.  Check the number of milligrams (mg) of sodium in one serving. Most people   should limit total sodium intake to less than 2,300 mg per day.  Always check the nutrition information of foods labeled as "low-fat" or "nonfat". These foods may be higher in added sugar or refined carbohydrates and should be avoided.  Talk to your dietitian to identify your daily goals for nutrients listed on the label. Shopping  Avoid buying canned, premade, or processed foods. These  foods tend to be high in fat, sodium, and added sugar.  Shop around the outside edge of the grocery store. This includes fresh fruits and vegetables, bulk grains, fresh meats, and fresh dairy. Cooking  Use low-heat cooking methods, such as baking, instead of high-heat cooking methods like deep frying.  Cook using healthy oils, such as olive, canola, or sunflower oil.  Avoid cooking with butter, cream, or high-fat meats. Meal planning  Eat meals and snacks regularly, preferably at the same times every day. Avoid going long periods of time without eating.  Eat foods high in fiber, such as fresh fruits, vegetables, beans, and whole grains. Talk to your dietitian about how many servings of carbohydrates you can eat at each meal.  Eat 4-6 ounces of lean protein each day, such as lean meat, chicken, fish, eggs, or tofu. 1 ounce is equal to 1 ounce of meat, chicken, or fish, 1 egg, or 1/4 cup of tofu.  Eat some foods each day that contain healthy fats, such as avocado, nuts, seeds, and fish. Lifestyle   Check your blood glucose regularly.  Exercise at least 30 minutes 5 or more days each week, or as told by your health care provider.  Take medicines as told by your health care provider.  Do not use any products that contain nicotine or tobacco, such as cigarettes and e-cigarettes. If you need help quitting, ask your health care provider.  Work with a counselor or diabetes educator to identify strategies to manage stress and any emotional and social challenges. What are some questions to ask my health care provider?  Do I need to meet with a diabetes educator?  Do I need to meet with a dietitian?  What number can I call if I have questions?  When are the best times to check my blood glucose? Where to find more information:  American Diabetes Association: diabetes.org/food-and-fitness/food  Academy of Nutrition and Dietetics:  www.eatright.org/resources/health/diseases-and-conditions/diabetes  National Institute of Diabetes and Digestive and Kidney Diseases (NIH): www.niddk.nih.gov/health-information/diabetes/overview/diet-eating-physical-activity Summary  A healthy meal plan will help you control your blood glucose and maintain a healthy lifestyle.  Working with a diet and nutrition specialist (dietitian) can help you make a meal plan that is best for you.  Keep in mind that carbohydrates and alcohol have immediate effects on your blood glucose levels. It is important to count carbohydrates and to use alcohol carefully. This information is not intended to replace advice given to you by your health care provider. Make sure you discuss any questions you have with your health care provider. Document Released: 01/13/2005 Document Revised: 05/23/2016 Document Reviewed: 05/23/2016 Elsevier Interactive Patient Education  2018 Elsevier Inc.  

## 2017-08-15 NOTE — Progress Notes (Signed)
Subjective:    Patient ID: Sherry Glass, female    DOB: 07-02-1970, 47 y.o.   MRN: [id number]  Pt presents to the office today for chronic follow up. Pt is followed by Pulmonologist monthly for COPD, Restrictive lung disease, and SOB.  Diabetes  She presents for her follow-up diabetic visit. She has type 2 diabetes mellitus. Her disease course has been stable. There are no hypoglycemic associated symptoms. Associated symptoms include foot paresthesias. Pertinent negatives for diabetes include no blurred vision and no visual change. Symptoms are worsening. Pertinent negatives for diabetic complications include no CVA, heart disease, nephropathy or peripheral neuropathy. Risk factors for coronary artery disease include diabetes mellitus, dyslipidemia, obesity and sedentary lifestyle. She is following a generally unhealthy diet. Her overall blood glucose range is 110-130 mg/dl. Eye exam is current.  Hypertension  This is a chronic problem. The current episode started more than 1 year ago. The problem has been resolved since onset. The problem is controlled. Associated symptoms include malaise/fatigue. Pertinent negatives include no blurred vision, peripheral edema or shortness of breath. Risk factors for coronary artery disease include dyslipidemia, diabetes mellitus and obesity. There is no history of kidney disease, CAD/MI or CVA.  Hyperlipidemia  This is a chronic problem. The current episode started more than 1 year ago. The problem is controlled. Recent lipid tests were reviewed and are normal. Exacerbating diseases include obesity. Pertinent negatives include no shortness of breath. Current antihyperlipidemic treatment includes statins. The current treatment provides moderate improvement of lipids. Risk factors for coronary artery disease include dyslipidemia, obesity and a sedentary lifestyle.  Depression         This is a chronic problem.  The current episode started more than 1 year ago.   The  onset quality is gradual.   The problem occurs intermittently.  Associated symptoms include helplessness, hopelessness, irritable and sad.  Past treatments include SSRIs - Selective serotonin reuptake inhibitors. Asthma  She complains of difficulty breathing. There is no shortness of breath. This is a chronic problem. The current episode started more than 1 year ago. The problem occurs intermittently. The problem has been waxing and waning. Associated symptoms include malaise/fatigue. Pertinent negatives include no ear congestion or ear pain. Her past medical history is significant for asthma and COPD.  Fibromyalgia PT states she has intermittent aching, burning pain bilateral legs of 7-8 out 10. Pt currently taking flexeril with mild relief.     Review of Systems  Constitutional: Positive for malaise/fatigue.  HENT: Negative for ear pain.   Eyes: Negative for blurred vision.  Respiratory: Negative for shortness of breath.   Psychiatric/Behavioral: Positive for depression.  All other systems reviewed and are negative.      Objective:   Physical Exam  Constitutional: She is oriented to person, place, and time. She appears well-developed and well-nourished. She is irritable. No distress.  HENT:  Head: Normocephalic and atraumatic.  Right Ear: External ear normal.  Mouth/Throat: Oropharynx is clear and moist.  Eyes: Pupils are equal, round, and reactive to light.  Neck: Normal range of motion. Neck supple. No thyromegaly present.  Cardiovascular: Normal rate, regular rhythm, normal heart sounds and intact distal pulses.  No murmur heard. Pulmonary/Chest: Effort normal. No respiratory distress. She has decreased breath sounds. She has no wheezes.  Abdominal: Soft. Bowel sounds are normal. She exhibits no distension. There is no tenderness.  Musculoskeletal: Normal range of motion. She exhibits no edema or tenderness.  Neurological: She is alert and oriented to person,  place, and time. She  has normal reflexes. No cranial nerve deficit.  Skin: Skin is warm and dry.  Psychiatric: She has a normal mood and affect. Her behavior is normal. Judgment and thought content normal.  Vitals reviewed.     BP 130/75   Pulse (!) 102   Temp (!) 97.4 F (36.3 C) (Oral)   Ht '5\' 3"'  (1.6 m)   Wt 207 lb (93.9 kg)   BMI 36.67 kg/m      Assessment & Plan:  1. Hypertension associated with diabetes ([place]) - CMP14+EGFR  2. Restrictive lung disease - CMP14+EGFR  3. Moderate persistent asthma without complication - XLK44+WNUU  4. Chronic obstructive pulmonary disease, unspecified COPD type ([address])  - CMP14+EGFR  5. Uncontrolled type 2 diabetes mellitus with hyperglycemia (HCC) - Bayer DCA Hb A1c Waived - CMP14+EGFR  6. Morbid obesity (HCC)  - CMP14+EGFR  7. Hyperlipidemia associated with type 2 diabetes mellitus (HCC) - CMP14+EGFR - Lipid panel  8. Moderate episode of recurrent major depressive disorder (HCC)  - CMP14+EGFR  9. Fibromyalgia - CMP14+EGFR  10. Hyperthyroidism  - CMP14+EGFR - TSH   Continue all meds and keep appt for Pul monologists  Labs pending Health Maintenance reviewed Diet and exercise encouraged RTO 3 months   Evelina Dun, [name]

## 2017-08-16 LAB — CMP14+EGFR
ALT: 28 IU/L (ref 0–32)
AST: 19 IU/L (ref 0–40)
Albumin/Globulin Ratio: 1.4 (ref 1.2–2.2)
Albumin: 4.1 g/dL (ref 3.5–5.5)
Alkaline Phosphatase: 86 IU/L (ref 39–117)
BUN/Creatinine Ratio: 19 (ref 9–23)
BUN: 9 mg/dL (ref 6–24)
Bilirubin Total: 0.5 mg/dL (ref 0.0–1.2)
CO2: 25 mmol/L (ref 20–29)
Calcium: 10.2 mg/dL (ref 8.7–10.2)
Chloride: 99 mmol/L (ref 96–106)
Creatinine, Ser: 0.48 mg/dL — ABNORMAL LOW (ref 0.57–1.00)
GFR calc Af Amer: 134 mL/min/{1.73_m2} (ref >59)
GFR calc non Af Amer: 116 mL/min/{1.73_m2} (ref >59)
Globulin, Total: 3 g/dL (ref 1.5–4.5)
Glucose: 212 mg/dL — ABNORMAL HIGH (ref 65–99)
Potassium: 4.4 mmol/L (ref 3.5–5.2)
Sodium: 140 mmol/L (ref 134–144)
Total Protein: 7.1 g/dL (ref 6.0–8.5)

## 2017-08-16 LAB — LIPID PANEL
Chol/HDL Ratio: 3.6 ratio (ref 0.0–4.4)
Cholesterol, Total: 171 mg/dL (ref 100–199)
HDL: 47 mg/dL (ref >39)
LDL Calculated: 67 mg/dL (ref 0–99)
Triglycerides: 283 mg/dL — ABNORMAL HIGH (ref 0–149)
VLDL Cholesterol Cal: 57 mg/dL — ABNORMAL HIGH (ref 5–40)

## 2017-08-16 LAB — TSH: TSH: <0.006 u[IU]/mL — ABNORMAL LOW (ref 0.450–4.500)

## 2017-08-31 ENCOUNTER — Other Ambulatory Visit: Payer: Self-pay | Admitting: Family

## 2017-08-31 DIAGNOSIS — F411 Generalized anxiety disorder: Secondary | ICD-10-CM

## 2017-09-01 ENCOUNTER — Ambulatory Visit (INDEPENDENT_AMBULATORY_CARE_PROVIDER_SITE_OTHER): Payer: Self-pay | Admitting: Emergency Medicine

## 2017-09-01 ENCOUNTER — Encounter: Payer: Self-pay | Admitting: Emergency Medicine

## 2017-09-01 DIAGNOSIS — J449 Chronic obstructive pulmonary disease, unspecified: Secondary | ICD-10-CM

## 2017-09-01 DIAGNOSIS — J302 Other seasonal allergic rhinitis: Secondary | ICD-10-CM

## 2017-09-01 NOTE — Patient Instructions (Signed)
We will continue Bevespi 2 puffs twice a day. We may decide to change to an alternative once you get your Medicaid coverage.  Keep your albuterol nebulizer available to use up to every 4 hours as needed for shortness of breath, wheezing, chest tightness. Continue Singulair every evening. Start using loratadine 10 mg (Claritin) once a day We will try starting nasal saline rinses to see if this helps with your allergies and severe congestion We will start Dymista 1 spray each nostril twice a day. Follow with Dr Delton Coombes in 1 month or next available

## 2017-09-01 NOTE — Progress Notes (Signed)
Subjective:    Patient ID: Sherry Glass, female    DOB: 1970/06/05, 47 y.o.   MRN: [id number]  HPI 47 year old woman former smoker (25 pack years), with a history of diabetes, hypertension, obesity, allergic rhinitis, moderate persistent asthma formally followed here by Dr. Jamison Neighbor.  She has been managed most recently on Pulmicort plus Perforomist nebulized.  She has a right upper lobe groundglass pulmonary nodule that has been stable on serial CT scans most recently in July 2018.  She is scheduled for repeat scan in July 2019.  Sleep study 12/2016 with poor sleep time, but no observed OSA.   She reports that she doesn't feel any real change on the nebulized meds. She does still have exertional dyspnea. She was hospitalized for cough and dyspnea earlier this month, treated with steroids, better now. That was her first flare for many months.  Uses albuterol rarely.  She is on singulair, not on zyrtec. Not on omeprazole. She is on lisinopril.   Acute 08/01/17 --follow-up visit for patient with a history of COPD, allergic rhinitis, hypertension (formerly on ACE inhibitor), nocturnal hypoxemia without documented sleep apnea.  Also with pulmonary nodules that we are following by serial CT scan as above.  She presents today for an acute visit complaining of about 1 month of progressive dyspnea, happens in the am and with activity. Some improvement with albuterol HFA. Her cough is better - lisinopril was changed since last time. Her medicaid is being changed to family plan, she needs to do the paperwork to get her meds covered again.   ROV 09/01/17 --patient follows up today for her COPD, allergic rhinitis.  She also has a history of cough that has been improved, pulmonary nodules that we followed with serial CT scans.  At her last visit she was experiencing more progressive dyspnea and I changed Perforomist and budesonide nebulized treatments to Bevespi. She has noted some short-lived benefit that seems to wear  off, but less exertional SOB. Having more congestion over the last week. She is on singulair. She is working on Education officer, environmental and disability. She is worried about cost of any allergy meds.    Review of Systems  Past Medical History:  Diagnosis Date  . Allergic rhinitis   . Anxiety and depression   . Cataract   . Chest pain syndrome   . COPD (chronic obstructive pulmonary disease) (HCC)   . Depression   . Diabetes mellitus without complication (HCC)   . History of kidney stones   . HTN (hypertension)   . Hyperglycemia   . Hypertension   . Obesity   . Restless leg syndrome   . Tobacco abuse      Family History  Adopted: Yes  Family history unknown: Yes     Social History   Socioeconomic History  . Marital status: Widowed    Spouse name: Not on file  . Number of children: Not on file  . Years of education: Not on file  . Highest education level: Not on file  Occupational History  . Not on file  Social Needs  . Financial resource strain: Not on file  . Food insecurity:    Worry: Not on file    Inability: Not on file  . Transportation needs:    Medical: Not on file    Non-medical: Not on file  Tobacco Use  . Smoking status: Former Smoker    Packs/day: 1.00    Years: 25.00    Pack years: 25.00  Types: Cigarettes    Start date: 11/13/1988    Last attempt to quit: 10/28/2016    Years since quitting: 0.8  . Smokeless tobacco: Never Used  Substance and Sexual Activity  . Alcohol use: Yes    Comment: For special occasions  . Drug use: No  . Sexual activity: Never    Birth control/protection: Surgical  Lifestyle  . Physical activity:    Days per week: Not on file    Minutes per session: Not on file  . Stress: Not on file  Relationships  . Social connections:    Talks on phone: Not on file    Gets together: Not on file    Attends religious service: Not on file    Active member of club or organization: Not on file    Attends meetings of clubs or  organizations: Not on file    Relationship status: Not on file  . Intimate partner violence:    Fear of current or ex partner: Not on file    Emotionally abused: Not on file    Physically abused: Not on file    Forced sexual activity: Not on file  Other Topics Concern  . Not on file  Social History Narrative   [employer] [facility] (11/18/16):   Originally from [place] but grew up in [place]. Cared for her husband until he passed. 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 Does have significant dust exposure. Has 3 dogs currently. Previously had a parakeet as a child. No mold exposure. Enjoys playing with her grandchildren.          Allergies  Allergen Reactions  . Dilaudid [Hydromorphone]     Itching and vomitting  . Hydromorphone   . Valproic Acid And Related      Outpatient Medications Prior to Visit  Medication Sig Dispense Refill  . ACCU-CHEK FASTCLIX LANCETS MISC USE TO check blood glucose twice daily 102 each 2  . albuterol (PROVENTIL) (2.5 MG/3ML) 0.083% nebulizer solution Use 1 vial (3 mL) via nebulizer every 6 hours as needed for wheezing or shortness of breath 150 mL 5  . aspirin EC 81 MG tablet Take one po qd 90 tablet 1  . budesonide (PULMICORT) 0.5 MG/2ML nebulizer solution Take 2 mLs (0.5 mg total) 2 (two) times daily by nebulization. 120 mL 3  . CINNAMON PO Take 1,000 mg by mouth 2 (two) times daily.    . cyclobenzaprine (FLEXERIL) 10 MG tablet Take 1 Tablet by mouth 3 times a day as needed for muscle spasms 60 tablet 3  . escitalopram (LEXAPRO) 20 MG tablet TAKE 1 TABLET DAILY 30 tablet 2  . formoterol (PERFOROMIST) 20 MCG/2ML nebulizer solution Take 2 mLs (20 mcg total) 2 (two) times daily by nebulization. 120 mL 3  . glimepiride (AMARYL) 2 MG tablet Take 1 tablet (2 mg total) by mouth daily with breakfast. 90 tablet 1  . glucose blood (ACCU-CHEK GUIDE) test strip USE TO check blood glucose twice daily 100 each 2  .  Glycopyrrolate-Formoterol (BEVESPI AEROSPHERE) 9-4.8 MCG/ACT AERO Inhale 2 puffs into the lungs 2 (two) times daily. 4 Inhaler 0  . losartan (COZAAR) 25 MG tablet TAKE 1 TABLET DAILY 30 tablet 1  . metFORMIN (GLUCOPHAGE) 1000 MG tablet Take 1 tablet (1,000 mg total) by mouth 2 (two) times daily with a meal. 180 tablet 3  . montelukast (SINGULAIR) 10 MG tablet Take 1 tablet (10 mg total) by mouth at bedtime. 30 tablet 5  .  nitroGLYCERIN (NITROSTAT) 0.4 MG SL tablet Place 1 tablet (0.4 mg total) under the tongue every 5 (five) minutes as needed for chest pain. 25 tablet 3  . pravastatin (PRAVACHOL) 20 MG tablet TAKE 1 TABLET ONCE A DAY 30 tablet 2  . Vitamin D, Ergocalciferol, (DRISDOL) 50000 units CAPS capsule Take 1 capsule (50,000 Units total) by mouth every 7 (seven) days. 12 capsule 3   No facility-administered medications prior to visit.         Objective:   Physical Exam Vitals:   09/01/17 0853  BP: 130/78  Pulse: 91  SpO2: 98%  Weight: 213 lb (96.6 kg)  Height:  (1.626 m)   Gen: Pleasant, obese woman, in no distress,  normal affect  ENT: she has a bruise under her R chin,  mouth clear,  oropharynx clear, no postnasal drip but significant congestion  Neck: No JVD, no TMG, no carotid bruits  Lungs: No use of accessory muscles, clear bilaterally  Cardiovascular: RRR, heart sounds normal, no murmur or gallops, no peripheral edema  Musculoskeletal: No deformities, no cyanosis or clubbing  Neuro: alert, non focal  Skin: Warm, no lesions or rashes    CT scan chest 11/25/16 --  COMPARISON:  None.  FINDINGS: Cardiovascular: Limited evaluation in the absence of intravenous contrast. Trace calcifications along the course of the left anterior descending coronary artery. The heart is normal in size. No pericardial effusion.  Mediastinum/Nodes: Unremarkable CT appearance of the thyroid gland. No suspicious mediastinal or hilar adenopathy. No soft tissue mediastinal  mass. The thoracic esophagus is unremarkable.  Lungs/Pleura: Patchy focus of ground-glass attenuation airspace opacity in the right upper lung measures 1.8 x 0.8 cm in greatest dimension. Similar findings were present on the prior CT scan from February of 2017. No significant interval change. The lungs are otherwise clear. No significant emphysematous changes or bronchial wall thickening.  Upper Abdomen: The gallbladder is surgically absent.  Musculoskeletal: No acute fracture or aggressive appearing lytic or blastic osseous lesion.  IMPRESSION: 1. Patchy focus of ground-glass attenuation airspace opacity in the right upper lung measuring approximately 1.8 x 0.8 cm in greatest dimensions. Similar findings were present on the prior CT scan from February of 2017. As such, this may simply represent a region of residual pleuroparenchymal scarring. However, low grade indolent adenocarcinoma can have a similar appearance and continued follow-up is recommended. Repeat CT is recommended every 2 years until 5 years of stability has been established. This recommendation follows the consensus statement: Guidelines for Management of Incidental Pulmonary Nodules Detected on CT Images:From the Fleischner Society 2017; published online before print (10.1148/radiol.[id number]). 2. Trace coronary artery calcifications. Please note that although the presence of coronary artery calcium documents the presence of coronary artery disease, the severity of this disease and any potential stenosis cannot be assessed on this non-gated CT examination. Assessment for potential risk factor modification, dietary therapy or pharmacologic therapy may be warranted, if clinically indicated. 3. Surgical changes of prior cholecystectomy.     Assessment & Plan:  COPD (chronic obstructive pulmonary disease) (HCC) We will continue Bevespi 2 puffs twice a day. We may decide to change to an alternative once you get  your Medicaid coverage.  Keep your albuterol nebulizer available to use up to every 4 hours as needed for shortness of breath, wheezing, chest tightness.  Chronic seasonal allergic rhinitis Continue Singulair every evening. Start using loratadine 10 mg (Claritin) once a day We will try starting nasal saline rinses to see if this helps  with your allergies and severe congestion We will start Dymista 1 spray each nostril twice a day.  Levy Pupa, MD, PhD 09/01/2017, 9:20 AM [facility] [phone] or if no answer [phone]

## 2017-09-01 NOTE — Assessment & Plan Note (Signed)
Continue Singulair every evening. Start using loratadine 10 mg (Claritin) once a day We will try starting nasal saline rinses to see if this helps with your allergies and severe congestion We will start Dymista 1 spray each nostril twice a day.

## 2017-09-01 NOTE — Assessment & Plan Note (Signed)
We will continue Bevespi 2 puffs twice a day. We may decide to change to an alternative once you get your Medicaid coverage.  Keep your albuterol nebulizer available to use up to every 4 hours as needed for shortness of breath, wheezing, chest tightness.

## 2017-10-03 ENCOUNTER — Other Ambulatory Visit: Payer: Self-pay | Admitting: Family

## 2017-10-09 ENCOUNTER — Telehealth: Payer: Self-pay | Admitting: Emergency Medicine

## 2017-10-09 ENCOUNTER — Encounter: Payer: Self-pay | Admitting: Emergency Medicine

## 2017-10-09 NOTE — Telephone Encounter (Signed)
Spoke with the pt  She states she wants RB to know she has been using her o2 more during the day  She had just been using with sleep  Over the past few wks she has felt more SOB during the day, and has found that o2 helps with this  She is scheduled for ct chest in July and then ov with RB 12/08/17  I offered sooner ov and she refused, stating that she just wants RB to be aware she is using her o2 more

## 2017-10-09 NOTE — Telephone Encounter (Signed)
Ok thank you for letting me know.

## 2017-10-23 ENCOUNTER — Ambulatory Visit: Payer: Self-pay | Admitting: Emergency Medicine

## 2017-11-23 ENCOUNTER — Encounter: Payer: Self-pay | Admitting: Family

## 2017-11-23 ENCOUNTER — Ambulatory Visit (INDEPENDENT_AMBULATORY_CARE_PROVIDER_SITE_OTHER): Payer: Self-pay | Admitting: Family

## 2017-11-23 VITALS — BP 133/79 | HR 86 | Ht 64.0 in | Wt 206.0 lb

## 2017-11-23 DIAGNOSIS — E785 Hyperlipidemia, unspecified: Secondary | ICD-10-CM

## 2017-11-23 DIAGNOSIS — E1142 Type 2 diabetes mellitus with diabetic polyneuropathy: Secondary | ICD-10-CM

## 2017-11-23 DIAGNOSIS — F411 Generalized anxiety disorder: Secondary | ICD-10-CM

## 2017-11-23 MED ORDER — PRAVASTATIN SODIUM 20 MG PO TABS: 20.0000 mg | ORAL_TABLET | Freq: Every day | ORAL | 1 refills | Status: DC

## 2017-11-23 MED ORDER — GLIMEPIRIDE 2 MG PO TABS: 2.0000 mg | ORAL_TABLET | Freq: Every day | ORAL | 1 refills | Status: DC

## 2017-11-23 MED ORDER — LOSARTAN POTASSIUM 25 MG PO TABS: 25.0000 mg | ORAL_TABLET | Freq: Every day | ORAL | 2 refills | Status: DC

## 2017-11-23 MED ORDER — METFORMIN HCL 1000 MG PO TABS: 1000.0000 mg | ORAL_TABLET | Freq: Two times a day (BID) | ORAL | 3 refills | Status: DC

## 2017-11-23 MED ORDER — ESCITALOPRAM OXALATE 20 MG PO TABS: 20.0000 mg | ORAL_TABLET | Freq: Every day | ORAL | 2 refills | Status: DC

## 2017-11-23 MED ORDER — GABAPENTIN 300 MG PO CAPS: 300.0000 mg | ORAL_CAPSULE | Freq: Two times a day (BID) | ORAL | 2 refills | Status: DC

## 2017-11-23 NOTE — Patient Instructions (Signed)
Diabetic Neuropathy Diabetic neuropathy is a nerve disease or nerve damage that is caused by diabetes mellitus. About half of all people with diabetes mellitus have some form of nerve damage. Nerve damage is more common in those who have had diabetes mellitus for many years and who generally have not had good control of their blood sugar (glucose) level. Diabetic neuropathy is a common complication of diabetes mellitus. There are three common types of diabetic neuropathy and a fourth type that is less common and less understood:  Peripheral neuropathy-This is the most common type of diabetic neuropathy. It causes damage to the nerves of the feet and legs first and then eventually the hands and arms. The damage affects the ability to sense touch.  Autonomic neuropathy-This type causes damage to the autonomic nervous system, which controls the following functions: ? Heartbeat. ? Body temperature. ? Blood pressure. ? Urination. ? Digestion. ? Sweating. ? Sexual function.  Focal neuropathy-Focal neuropathy can be painful and unpredictable and occurs most often in older adults with diabetes mellitus. It involves a specific nerve or one area and often comes on suddenly. It usually does not cause long-term problems.  Radiculoplexus neuropathy- Sometimes called lumbosacral radiculoplexus neuropathy, radiculoplexus neuropathy affects the nerves of the thighs, hips, buttocks, or legs. It is more common in people with type 2 diabetes mellitus and in older men. It is characterized by debilitating pain, weakness, and atrophy, usually in the thigh muscles.  What are the causes? The cause of peripheral, autonomic, and focal neuropathies is diabetes mellitus that is uncontrolled and high glucose levels. The cause of radiculoplexus neuropathy is unknown. However, it is thought to be caused by inflammation related to uncontrolled glucose levels. What are the signs or symptoms? Peripheral Neuropathy Peripheral  neuropathy develops slowly over time. When the nerves of the feet and legs no longer work there may be:  Burning, stabbing, or aching pain in the legs or feet.  Inability to feel pressure or pain in your feet. This can lead to: ? Thick calluses over pressure areas. ? Pressure sores. ? Ulcers.  Foot deformities.  Reduced ability to feel temperature changes.  Muscle weakness.  Autonomic Neuropathy The symptoms of autonomic neuropathy vary depending on which nerves are affected. Symptoms may include:  Problems with digestion, such as: ? Feeling sick to your stomach (nausea). ? Vomiting. ? Bloating. ? Constipation. ? Diarrhea. ? Abdominal pain.  Difficulty with urination. This occurs if you lose your ability to sense when your bladder is full. Problems include: ? Urine leakage (incontinence). ? Inability to empty your bladder completely (retention).  Rapid or irregular heartbeat (palpitations).  Blood pressure drops when you stand up (orthostatic hypotension). When you stand up you may feel: ? Dizzy. ? Weak. ? Faint.  In men, inability to attain and maintain an erection.  In women, vaginal dryness and problems with decreased sexual desire and arousal.  Problems with body temperature regulation.  Increased or decreased sweating.  Focal Neuropathy  Abnormal eye movements or abnormal alignment of both eyes.  Weakness in the wrist.  Foot drop. This results in an inability to lift the foot properly and abnormal walking or foot movement.  Paralysis on one side of your face (Bell palsy).  Chest or abdominal pain. Radiculoplexus Neuropathy  Sudden, severe pain in your hip, thigh, or buttocks.  Weakness and wasting of thigh muscles.  Difficulty rising from a seated position.  Abdominal swelling.  Unexplained weight loss (usually more than 10 lb [4.5 kg]). How is   this diagnosed? Peripheral Neuropathy Your senses may be tested. Sensory function testing can be  done with:  A light touch using a monofilament.  A vibration with tuning fork.  A sharp sensation with a pin prick.  Other tests that can help diagnose neuropathy are:  Nerve conduction velocity. This test checks the transmission of an electrical current through a nerve.  Electromyography. This shows how muscles respond to electrical signals transmitted by nearby nerves.  Quantitative sensory testing. This is used to assess how your nerves respond to vibrations and changes in temperature.  Autonomic Neuropathy Diagnosis is often based on reported symptoms. Tell your health care provider if you experience:  Dizziness.  Constipation.  Diarrhea.  Inappropriate urination or inability to urinate.  Inability to get or maintain an erection.  Tests that may be done include:  Electrocardiography or Holter monitor. These are tests that can help show problems with the heart rate or heart rhythm.  An X-ray exam may be done.  Focal Neuropathy Diagnosis is made based on your symptoms and what your health care provider finds during your exam. Other tests may be done. They may include:  Nerve conduction velocities. This checks the transmission of electrical current through a nerve.  Electromyography. This shows how muscles respond to electrical signals transmitted by nearby nerves.  Quantitative sensory testing. This test is used to assess how your nerves respond to vibration and changes in temperature.  Radiculoplexus Neuropathy  Often the first thing is to eliminate any other issue or problems that might be the cause, as there is no standard test for diagnosis.  X-ray exam of your spine and lumbar region.  Spinal tap to rule out cancer.  MRI to rule out other lesions. How is this treated? Once nerve damage occurs, it cannot be reversed. The goal of treatment is to keep the disease or nerve damage from getting worse and affecting more nerve fibers. Controlling your blood  glucose level is the key. Most people with radiculoplexus neuropathy see at least a partial improvement over time. You will need to keep your blood glucose and HbA1c levels in the target range determined by your health care provider. Things that help control blood glucose levels include:  Blood glucose monitoring.  Meal planning.  Physical activity.  Diabetes medicine.  Over time, maintaining lower blood glucose levels helps lessen symptoms. Sometimes, prescription pain medicine is needed. Follow these instructions at home:  Do not smoke.  Keep your blood glucose level in the range that you and your health care provider have determined acceptable for you.  Keep your blood pressure level in the range that you and your health care provider have determined acceptable for you.  Eat a well-balanced diet.  Be physically active every day. Include strength training and balance exercises.  Protect your feet. ? Check your feet every day for sores, cuts, blisters, or signs of infection. ? Wear padded socks and supportive shoes. Use orthotic inserts, if necessary. ? Regularly check the insides of your shoes for worn spots. Make sure there are no rocks or other items inside your shoes before you put them on. Contact a health care provider if:  You have burning, stabbing, or aching pain in the legs or feet.  You are unable to feel pressure or pain in your feet.  You develop problems with digestion such as: ? Nausea. ? Vomiting. ? Bloating. ? Constipation. ? Diarrhea. ? Abdominal pain.  You have difficulty with urination, such as: ? Incontinence. ? Retention.    You have palpitations.  You develop orthostatic hypotension. When you stand up you may feel: ? Dizzy. ? Weak. ? Faint.  You cannot attain and maintain an erection (in men).  You have vaginal dryness and problems with decreased sexual desire and arousal (in women).  You have severe pain in your thighs, legs, or  buttocks.  You have unexplained weight loss. This information is not intended to replace advice given to you by your health care provider. Make sure you discuss any questions you have with your health care provider. Document Released: 06/27/2001 Document Revised: 09/24/2015 Document Reviewed: 09/27/2012 Elsevier Interactive Patient Education  2017 [employer]  

## 2017-11-23 NOTE — Progress Notes (Signed)
   Subjective:    Patient ID: Sherry Glass, female    DOB: 08/02/1970, 47 y.o.   MRN: [id number]  Chief Complaint  Patient presents with  . numbness in feet    HPI PT presents to the office today with bilateral numbness in her toes that started about a month ago that has gradually worsen. She states at time the pain will radiate up to her feet. Reports intermittent numbness and tingling sensation of 7 out 10.   She has tried keeping her feet elevated that does not seem to help. States walking makes the pain worse.   She is a diabetic and her last A1C was 6.9 on 08/15/17.    Review of Systems  All other systems reviewed and are negative.      Objective:   Physical Exam  Constitutional: She is oriented to person, place, and time. She appears well-developed and well-nourished. No distress.  HENT:  Head: Normocephalic and atraumatic.  Eyes: Pupils are equal, round, and reactive to light.  Neck: Normal range of motion. Neck supple. No thyromegaly present.  Cardiovascular: Normal rate, regular rhythm, normal heart sounds and intact distal pulses.  No murmur heard. Pulmonary/Chest: Effort normal and breath sounds normal. No respiratory distress. She has no wheezes.  Abdominal: Soft. Bowel sounds are normal. She exhibits no distension. There is no tenderness.  Musculoskeletal: Normal range of motion. She exhibits no edema or tenderness.  Neurological: She is alert and oriented to person, place, and time. She has normal reflexes. No cranial nerve deficit.  Skin: Skin is warm and dry.  Psychiatric: She has a normal mood and affect. Her behavior is normal. Judgment and thought content normal.  Vitals reviewed.  See Diabetic foot note.   BP 133/79   Pulse 86   Ht 5\' 4"  (1.626 m)   Wt 206 lb (93.4 kg)   BMI 35.36 kg/m      Assessment & Plan:  Sherry Glass was seen today for numbness in feet.  Diagnoses and all orders for this visit:  Diabetic polyneuropathy associated with type 2  diabetes mellitus (HCC) -     gabapentin (NEURONTIN) 300 MG capsule; Take 1 capsule (300 mg total) by mouth 2 (two) times daily.  Hyperlipidemia, unspecified hyperlipidemia type -     pravastatin (PRAVACHOL) 20 MG tablet; Take 1 tablet (20 mg total) by mouth daily.  GAD (generalized anxiety disorder) -     escitalopram (LEXAPRO) 20 MG tablet; Take 1 tablet (20 mg total) by mouth daily.  Other orders -     metFORMIN (GLUCOPHAGE) 1000 MG tablet; Take 1 tablet (1,000 mg total) by mouth 2 (two) times daily with a meal. -     losartan (COZAAR) 25 MG tablet; Take 1 tablet (25 mg total) by mouth daily. -     glimepiride (AMARYL) 2 MG tablet; Take 1 tablet (2 mg total) by mouth daily with breakfast.   We will refill her medications today to [facility] since she is self at this time.  Will start gabapentin 300 mg BID. She will start taking nightly and if needed she can take during the day. She states she has been on this before and it made her sleepy.  Discussed importance of looking at her feet daily  Continue good control of BS  Keep f/u with me    Jannifer Rodneyhristy Hawks, [name]

## 2017-11-27 ENCOUNTER — Ambulatory Visit (INDEPENDENT_AMBULATORY_CARE_PROVIDER_SITE_OTHER)
Admission: RE | Admit: 2017-11-27 | Discharge: 2017-11-27 | Disposition: A | Discharge status: Home / Self Care | Payer: Self-pay | Source: Ambulatory Visit | Attending: Emergency Medicine | Admitting: Emergency Medicine

## 2017-11-27 DIAGNOSIS — R911 Solitary pulmonary nodule: Secondary | ICD-10-CM

## 2017-12-01 ENCOUNTER — Telehealth: Payer: Self-pay | Admitting: Emergency Medicine

## 2017-12-01 DIAGNOSIS — R918 Other nonspecific abnormal finding of lung field: Secondary | ICD-10-CM

## 2017-12-01 NOTE — Telephone Encounter (Signed)
Spoke with the pt  She is requesting ct chest results  She is aware that RB out of the office until 12/04/17  Please advise thanks

## 2017-12-05 NOTE — Telephone Encounter (Signed)
Please let her know that her CT scan shows that her pulmonary nodule is stable. This is good news. She will need a follow up scan in July 2020 to insure stability

## 2017-12-05 NOTE — Telephone Encounter (Signed)
lmtcb for pt.  Follow up ct scheduled.

## 2017-12-06 NOTE — Telephone Encounter (Signed)
Called and spoke with patient regarding results.  Informed the patient of results and recommendations today. Pt verbalized understanding and denied any questions or concerns at this time.  Nothing further needed.  

## 2017-12-08 ENCOUNTER — Encounter: Payer: Self-pay | Admitting: Emergency Medicine

## 2017-12-08 ENCOUNTER — Ambulatory Visit (INDEPENDENT_AMBULATORY_CARE_PROVIDER_SITE_OTHER): Payer: Self-pay | Admitting: Emergency Medicine

## 2017-12-08 DIAGNOSIS — J449 Chronic obstructive pulmonary disease, unspecified: Secondary | ICD-10-CM

## 2017-12-08 DIAGNOSIS — J302 Other seasonal allergic rhinitis: Secondary | ICD-10-CM

## 2017-12-08 MED ORDER — TIOTROPIUM BROMIDE-OLODATEROL 2.5-2.5 MCG/ACT IN AERS: 2.0000 | INHALATION_SPRAY | Freq: Every day | RESPIRATORY_TRACT | 0 refills | Status: DC

## 2017-12-08 NOTE — Progress Notes (Signed)
Sherry Glass    [id number]    09/12/1970  Primary Care Physician:Hawks, Edilia Glass, Glass  Referring Physician: Junie Spencer, Glass [address] [place], [place] [address]  Chief complaint:  COPD management  HPI: Sherry Glass is Glass 47 yo F w/ PMH listed below presenting to the clinic for management of her COPD. She has been unable to start Bevespi 2/2 expensive copay and has been using her budesonide and Perforomist. She is continuing to endorse nighttime cough interfering with sleep. Having dyspnea on exertion including doing laundry and climbing up stairs. She also has significant rhinitis in the morning with productive cough with white sputum. She is using her rescue inhaler every day at least 2-3 times Glass day. Denies any F/N/V/D/C  She is also requesting script for portable oxygen concentrator. She states her current concentrator is too difficult to carry around. Her last walk test in office did not show significant de-sats. She wants to know if there can be another test to see if she would qualify. She states the home oxygen improves her sleep significantly and during daytime when she requires exertion. She is currently not using any CPAP.  Occupation: None Exposures: None Smoking history: 20 pack year smoking history no longer smoking Travel history: None  Allergies as of 12/08/2017 - Review Complete 12/08/2017  Allergen Reaction Noted  . Dilaudid [hydromorphone]  05/13/2016  . Hydromorphone  07/03/2012  . Valproic acid and related  07/03/2012    Past Medical History:  Diagnosis Date  . Allergic rhinitis   . Anxiety and depression   . Cataract   . Chest pain syndrome   . COPD (chronic obstructive pulmonary disease) (HCC)   . Depression   . Diabetes mellitus without complication (HCC)   . History of kidney stones   . HTN (hypertension)   . Hyperglycemia   . Hypertension   . Obesity   . Restless leg syndrome   . Tobacco abuse     Past Surgical History:    Procedure Laterality Date  . ABDOMINAL HYSTERECTOMY    . CARDIAC CATHETERIZATION  2014  . CATARACT EXTRACTION W/PHACO Left 06/23/2016   Procedure: CATARACT EXTRACTION PHACO AND INTRAOCULAR LENS PLACEMENT (IOC);  Surgeon: Gemma Payor, MD;  Location: [facility];  Service: Ophthalmology;  Laterality: Left;  left - pt knows to arrive at 10:50 cde 9.11  . CESAREAN SECTION    . CHOLECYSTECTOMY    . EXCISION MASS NECK     cyst  . FOOT MASS EXCISION    . hysteectomy --- unknown    . left foot surgery     removal of cyst  . NECK SURGERY    . TONSILLECTOMY       Review of systems: Review of Systems  Constitutional: Negative for fever and chills.  HENT: Negative.   Eyes: Negative for blurred vision.  Respiratory: as per HPI  Cardiovascular: Negative for chest pain and palpitations.  Gastrointestinal: Negative for vomiting, diarrhea, blood per rectum. Genitourinary: Negative for dysuria, urgency, frequency and hematuria.  Musculoskeletal: Negative for myalgias, back pain and joint pain.   All other systems reviewed and are negative.  Physical Exam: There were no vitals taken for this visit. Gen:      Pleasant obese female in no acute distress HEENT:  EOMI, sclera anicteric Neck:     No masses; no thyromegaly Lungs:    Distant breath sounds. No wheeze, rales. normal respiratory effort CV:  Regular rate and rhythm; no murmurs Ext:    No edema; adequate peripheral perfusion Skin:      Warm and dry; no rash Neuro: alert and oriented x 3 Psych: normal mood and affect  Data Reviewed: CT SCAN ON 11/27/17  Independently reviewed confirms the presence of stable ground glass opacity in posterior right upper lobe. Agrees with radiologist that the nodule has not increased significantly in size or quality. Continuing to be stable since 2017.  Glass/P: COPD (chronic obstructive pulmonary disease) (HCC) Patient still endorsing night time symptoms and using rescue inhaler 2~3x Glass day. Unable to  start Bevespi 2/2 insurance coverage. She needs better control of her COPD  - Start Stiolto 1 puff Glass day, given sample in clinic - Stop budesonide, perforomist - Schedule 6 min walk test to help qualify for portable concentrator - Albuterol nebulizer PRN for rescue   Chronic seasonal allergic rhinitis She is continuing to have significant cough in morning with rhinitis.   - C/w loratadine 10mg  daily and dymista 1 spray each nostril    Sherry Glass, PGY1 [facility] 12/08/2017, 10:17 AM  CC: Sherry Glass

## 2017-12-08 NOTE — Assessment & Plan Note (Addendum)
She is continuing to have significant cough in morning with rhinitis. Her post-nasal drip may be contributing to her COPD exacerbations.  - C/w loratadine 10mg  daily and dymista 1 spray each nostril

## 2017-12-08 NOTE — Progress Notes (Signed)
Patient seen in the office today and instructed on use of Stiolto Respimat.  Patient expressed understanding and demonstrated technique.  

## 2017-12-08 NOTE — Assessment & Plan Note (Addendum)
Patient still endorsing night time symptoms and using rescue inhaler 2~3x a day. Unable to start Bevespi 2/2 insurance coverage. She needs better control of her COPD  - Start Stiolto 1 puff a day, given sample in clinic - Stop budesonide, perforomist - Schedule 6 min walk test to help qualify for portable concentrator - Albuterol nebulizer PRN for rescue

## 2017-12-08 NOTE — Patient Instructions (Addendum)
Sherry Glass  We have reviewed your CT scan and it shows the nodule has not grown in size. We will order a 6 min walk test to help you qualify for oxygen concentrator. Also we will give you a sample of Stilto to see if they improve your symptoms. Please stop your budesonide and perforomist. Please return to clinic in 4 weeks to let us know if your symptoms are improving. Also remember to continue taking your allergy medicine.

## 2018-01-04 ENCOUNTER — Telehealth: Payer: Self-pay | Admitting: Emergency Medicine

## 2018-01-04 NOTE — Telephone Encounter (Signed)
Called and spoke with patient. Patient stated that she received a voicemail to call office but it did not leave a name or any details. Looked through chart and don't see where a call was placed or any new results are available to relay. Reminded patient of upcoming 6 min walk and to not take respiratory medications prior to walk. Patient verbalized understanding with walk. Nothing further needed at this time.

## 2018-01-19 ENCOUNTER — Ambulatory Visit: Payer: Self-pay

## 2018-01-19 ENCOUNTER — Ambulatory Visit: Payer: Self-pay | Admitting: Emergency Medicine

## 2018-01-30 ENCOUNTER — Ambulatory Visit (INDEPENDENT_AMBULATORY_CARE_PROVIDER_SITE_OTHER): Payer: Medicaid Other | Admitting: Family

## 2018-01-30 ENCOUNTER — Encounter: Payer: Self-pay | Admitting: Family

## 2018-01-30 VITALS — BP 132/67 | HR 87 | Temp 98.0°F | Ht 64.0 in | Wt 206.6 lb

## 2018-01-30 DIAGNOSIS — I10 Essential (primary) hypertension: Secondary | ICD-10-CM

## 2018-01-30 DIAGNOSIS — E1165 Type 2 diabetes mellitus with hyperglycemia: Secondary | ICD-10-CM | POA: Diagnosis not present

## 2018-01-30 DIAGNOSIS — J449 Chronic obstructive pulmonary disease, unspecified: Secondary | ICD-10-CM

## 2018-01-30 DIAGNOSIS — E559 Vitamin D deficiency, unspecified: Secondary | ICD-10-CM

## 2018-01-30 DIAGNOSIS — F411 Generalized anxiety disorder: Secondary | ICD-10-CM | POA: Diagnosis not present

## 2018-01-30 DIAGNOSIS — E0590 Thyrotoxicosis, unspecified without thyrotoxic crisis or storm: Secondary | ICD-10-CM

## 2018-01-30 DIAGNOSIS — E785 Hyperlipidemia, unspecified: Secondary | ICD-10-CM

## 2018-01-30 DIAGNOSIS — E1169 Type 2 diabetes mellitus with other specified complication: Secondary | ICD-10-CM | POA: Diagnosis not present

## 2018-01-30 DIAGNOSIS — E1159 Type 2 diabetes mellitus with other circulatory complications: Secondary | ICD-10-CM

## 2018-01-30 DIAGNOSIS — J4540 Moderate persistent asthma, uncomplicated: Secondary | ICD-10-CM | POA: Diagnosis not present

## 2018-01-30 LAB — BAYER DCA HB A1C WAIVED: HB A1C (BAYER DCA - WAIVED): 6.1 % (ref <7.0)

## 2018-01-30 NOTE — Patient Instructions (Addendum)

## 2018-01-30 NOTE — Progress Notes (Signed)
Subjective:    Patient ID: Sherry Glass, female    DOB: March 16, 1971, 47 y.o.   MRN: [id number]  Chief Complaint  Patient presents with  . Medical Management of Chronic Issues  . Diabetes   Pt presents to the office today for chronic follow up. Pt is followed by Pulmonologist 2 months for COPD, Restrictive lung disease, and SOB.  Hypertension  This is a chronic problem. The current episode started more than 1 year ago. The problem has been resolved since onset. The problem is controlled. Associated symptoms include anxiety and shortness of breath. Pertinent negatives include no blurred vision or peripheral edema. Risk factors for coronary artery disease include dyslipidemia, obesity and sedentary lifestyle. The current treatment provides moderate improvement. There is no history of kidney disease, CAD/MI or CVA.  Diabetes  She presents for her follow-up diabetic visit. She has type 2 diabetes mellitus. Her disease course has been stable. Hypoglycemia symptoms include nervousness/anxiousness. Associated symptoms include foot paresthesias. Pertinent negatives for diabetes include no blurred vision and no foot ulcerations. There are no hypoglycemic complications. Symptoms are worsening. Diabetic complications include peripheral neuropathy. Pertinent negatives for diabetic complications include no CVA or heart disease. Risk factors for coronary artery disease include diabetes mellitus, dyslipidemia, hypertension, sedentary lifestyle and post-menopausal. Her overall blood glucose range is 110-130 mg/dl. Eye exam is current.  Asthma  She complains of cough, frequent throat clearing and shortness of breath. There is no wheezing. This is a chronic problem. The current episode started more than 1 year ago. Her past medical history is significant for asthma.  Anxiety  Presents for follow-up visit. Symptoms include excessive worry, nervous/anxious behavior and shortness of breath. Patient reports no decreased  concentration, depressed mood, insomnia or irritability. Symptoms occur occasionally. The severity of symptoms is moderate. The quality of sleep is good.   Her past medical history is significant for asthma.  Hyperlipidemia  This is a chronic problem. The current episode started more than 1 year ago. The problem is controlled. Recent lipid tests were reviewed and are normal. Exacerbating diseases include obesity. Associated symptoms include shortness of breath. Current antihyperlipidemic treatment includes statins. The current treatment provides moderate improvement of lipids. Risk factors for coronary artery disease include dyslipidemia, hypertension and a sedentary lifestyle.      Review of Systems  Constitutional: Negative for irritability.  Eyes: Negative for blurred vision.  Respiratory: Positive for cough and shortness of breath. Negative for wheezing.   Psychiatric/Behavioral: Negative for decreased concentration. The patient is nervous/anxious. The patient does not have insomnia.   All other systems reviewed and are negative.      Objective:   Physical Exam  Constitutional: She is oriented to person, place, and time. She appears well-developed and well-nourished. No distress.  HENT:  Head: Normocephalic and atraumatic.  Right Ear: External ear normal.  Left Ear: External ear normal.  Mouth/Throat: Oropharynx is clear and moist.  Eyes: Pupils are equal, round, and reactive to light.  Neck: Normal range of motion. Neck supple. No thyromegaly present.  Cardiovascular: Normal rate, regular rhythm, normal heart sounds and intact distal pulses.  No murmur heard. Pulmonary/Chest: Effort normal. No respiratory distress. She has decreased breath sounds. She has no wheezes.  Abdominal: Soft. Bowel sounds are normal. She exhibits no distension. There is no tenderness.  Musculoskeletal: Normal range of motion. She exhibits no edema or tenderness.  Neurological: She is alert and oriented  to person, place, and time. She has normal reflexes. No cranial nerve  deficit.  Skin: Skin is warm and dry.  Psychiatric: She has a normal mood and affect. Her behavior is normal. Judgment and thought content normal.  Vitals reviewed.     BP 132/67   Pulse 87   Temp 98 F (36.7 C) (Oral)   Ht '5\' 4"'  (1.626 m)   Wt 206 lb 9.6 oz (93.7 kg)   BMI 35.46 kg/m      Assessment & Plan:  NATALLIA STELLMACH comes in today with chief complaint of Medical Management of Chronic Issues and Diabetes   Diagnosis and orders addressed:  1. Chronic obstructive pulmonary disease, unspecified COPD type ([place]) - CMP14+EGFR - CBC with Differential/Platelet  2. GAD (generalized anxiety disorder) - CMP14+EGFR - CBC with Differential/Platelet  3. Hyperlipidemia associated with type 2 diabetes mellitus ([place]) - CMP14+EGFR - Lipid panel - CBC with Differential/Platelet  4. Hypertension associated with diabetes ([place]) - CMP14+EGFR - CBC with Differential/Platelet  5. Moderate persistent asthma without complication - SUG64+GEFU - CBC with Differential/Platelet  6. Morbid obesity ([place]) - CMP14+EGFR - CBC with Differential/Platelet  7. Uncontrolled type 2 diabetes mellitus with hyperglycemia (HCC) - Bayer DCA Hb A1c Waived - CMP14+EGFR - CBC with Differential/Platelet  8. Vitamin D deficiency - CMP14+EGFR - CBC with Differential/Platelet  9. Subclinical hyperthyroidism - CMP14+EGFR - CBC with Differential/Platelet - Ambulatory referral to Endocrinology   Labs pending Health Maintenance reviewed Diet and exercise encouraged  Follow up plan: 6 months   Evelina Dun, [name]

## 2018-01-31 LAB — CMP14+EGFR
ALT: 28 IU/L (ref 0–32)
AST: 20 IU/L (ref 0–40)
Albumin/Globulin Ratio: 1.8 (ref 1.2–2.2)
Albumin: 4.6 g/dL (ref 3.5–5.5)
Alkaline Phosphatase: 94 IU/L (ref 39–117)
BUN/Creatinine Ratio: 20 (ref 9–23)
BUN: 9 mg/dL (ref 6–24)
Bilirubin Total: 0.9 mg/dL (ref 0.0–1.2)
CO2: 25 mmol/L (ref 20–29)
Calcium: 10.3 mg/dL — ABNORMAL HIGH (ref 8.7–10.2)
Chloride: 100 mmol/L (ref 96–106)
Creatinine, Ser: 0.46 mg/dL — ABNORMAL LOW (ref 0.57–1.00)
GFR calc Af Amer: 135 mL/min/{1.73_m2} (ref >59)
GFR calc non Af Amer: 117 mL/min/{1.73_m2} (ref >59)
Globulin, Total: 2.5 g/dL (ref 1.5–4.5)
Glucose: 103 mg/dL — ABNORMAL HIGH (ref 65–99)
Potassium: 4.5 mmol/L (ref 3.5–5.2)
Sodium: 141 mmol/L (ref 134–144)
Total Protein: 7.1 g/dL (ref 6.0–8.5)

## 2018-01-31 LAB — CBC WITH DIFFERENTIAL/PLATELET
Basophils Absolute: 0 10*3/uL (ref 0.0–0.2)
Basos: 0 %
EOS (ABSOLUTE): 0.1 10*3/uL (ref 0.0–0.4)
Eos: 1 %
Hematocrit: 41.5 % (ref 34.0–46.6)
Hemoglobin: 14.2 g/dL (ref 11.1–15.9)
Immature Grans (Abs): 0 10*3/uL (ref 0.0–0.1)
Immature Granulocytes: 0 %
Lymphocytes Absolute: 4 10*3/uL — ABNORMAL HIGH (ref 0.7–3.1)
Lymphs: 39 %
MCH: 29.7 pg (ref 26.6–33.0)
MCHC: 34.2 g/dL (ref 31.5–35.7)
MCV: 87 fL (ref 79–97)
Monocytes Absolute: 0.7 10*3/uL (ref 0.1–0.9)
Monocytes: 7 %
Neutrophils Absolute: 5.3 10*3/uL (ref 1.4–7.0)
Neutrophils: 53 %
Platelets: 295 10*3/uL (ref 150–450)
RBC: 4.78 x10E6/uL (ref 3.77–5.28)
RDW: 13 % (ref 12.3–15.4)
WBC: 10.2 10*3/uL (ref 3.4–10.8)

## 2018-01-31 LAB — LIPID PANEL
Chol/HDL Ratio: 3.5 ratio (ref 0.0–4.4)
Cholesterol, Total: 156 mg/dL (ref 100–199)
HDL: 44 mg/dL (ref >39)
LDL Calculated: 68 mg/dL (ref 0–99)
Triglycerides: 220 mg/dL — ABNORMAL HIGH (ref 0–149)
VLDL Cholesterol Cal: 44 mg/dL — ABNORMAL HIGH (ref 5–40)

## 2018-02-01 ENCOUNTER — Other Ambulatory Visit: Payer: Self-pay | Admitting: Family

## 2018-02-01 MED ORDER — CYCLOBENZAPRINE HCL 10 MG PO TABS: ORAL_TABLET | ORAL | 5 refills | Status: DC

## 2018-02-02 ENCOUNTER — Ambulatory Visit: Payer: Medicaid Other | Admitting: Emergency Medicine

## 2018-02-02 ENCOUNTER — Ambulatory Visit (INDEPENDENT_AMBULATORY_CARE_PROVIDER_SITE_OTHER): Payer: Medicaid Other | Admitting: *Deleted

## 2018-02-02 ENCOUNTER — Encounter: Payer: Self-pay | Admitting: Emergency Medicine

## 2018-02-02 DIAGNOSIS — J449 Chronic obstructive pulmonary disease, unspecified: Secondary | ICD-10-CM

## 2018-02-02 DIAGNOSIS — R911 Solitary pulmonary nodule: Secondary | ICD-10-CM

## 2018-02-02 DIAGNOSIS — G4734 Idiopathic sleep related nonobstructive alveolar hypoventilation: Secondary | ICD-10-CM

## 2018-02-02 DIAGNOSIS — IMO0001 Reserved for inherently not codable concepts without codable children: Secondary | ICD-10-CM

## 2018-02-02 NOTE — Assessment & Plan Note (Signed)
Therapeutic trial of Spiriva.  She felt that her cough was worse on this medication.  She thought she tolerated the Bevespi and benefited more from the Golden.  She now has Medicaid and we will try changing her back to [facility].  She will continue use albuterol as needed.  Talked to her today about pulmonary rehabilitation or initiate an exercise regimen

## 2018-02-02 NOTE — Assessment & Plan Note (Signed)
She is interested in changing oxygen DME companies.   We will work on trying to change your home oxygen company to [facility] from [facility].  You may need to have a repeat overnight oximetry on room air in order to make this change.  If so we will order it for you. Follow with Dr Delton Coombes in 6 months or sooner if you have any problems

## 2018-02-02 NOTE — Assessment & Plan Note (Signed)
She needs a final CT chest to follow-up for interval stability in July 2020

## 2018-02-02 NOTE — Patient Instructions (Addendum)
We will go back to bevespi 2 puffs twice a day. We will send a new prescription for you Please use albuterol either 2 puffs or 1 nebulizer treatment up to every 4 hours as needed for shortness of breath, chest tightness, wheezing. We will work on trying to change your home oxygen company to [facility] from [facility].  You may need to have a repeat overnight oximetry on room air in order to make this change.  If so we will order it for you. Repeat CT chest in July 2020 Follow with Dr Delton Coombes in 6 months or sooner if you have any problems

## 2018-02-02 NOTE — Progress Notes (Signed)
SIX MIN WALK 02/02/2018 01/27/2017  Medications Amaryl 2mg  and Metformin 1000mg  at 9am.  Aspirin 81mg , prednisone 20mg  & Janumet 100mg  all taken at 9:00am  Supplimental Oxygen during Test? (L/min) No No  Laps 9 8  Partial Lap (in Meters) 15 21  Baseline BP (sitting) 132/80 140/80  Baseline Heartrate 95 79  Baseline Dyspnea (Borg Scale) 2 0  Baseline Fatigue (Borg Scale) 0 0.5  Baseline SPO2 99 99  BP (sitting) 185/84 144/82  Heartrate 125 91  Dyspnea (Borg Scale) 8 4  Fatigue (Borg Scale) 10 0.5  SPO2 97 95  BP (sitting) 170/80 136/78  Heartrate 110 77  SPO2 99 100  Stopped or Paused before Six Minutes No No  Distance Completed [phone]  Tech Comments: Patient walked a fast pace, tolerated well, appeard very winded by end of walk. Denies chest pain or discomfort.  test performed with forehead probe. pt completed test at moderate pace with no complaints or desats.

## 2018-02-02 NOTE — Progress Notes (Signed)
Subjective:    Patient ID: Sherry Glass, female    DOB: 15-Sep-1970, 47 y.o.   MRN: [id number]  COPD  Her past medical history is significant for COPD.   47 year old woman former smoker (25 pack years), with a history of diabetes, hypertension, obesity, allergic rhinitis, moderate persistent asthma formally followed here by Dr. Jamison Neighbor.  She has been managed most recently on Pulmicort plus Perforomist nebulized.  She has a right upper lobe groundglass pulmonary nodule that has been stable on serial CT scans most recently in July 2018.  She is scheduled for repeat scan in July 2019.  Sleep study 12/2016 with poor sleep time, but no observed OSA.   She reports that she doesn't feel any real change on the nebulized meds. She does still have exertional dyspnea. She was hospitalized for cough and dyspnea earlier this month, treated with steroids, better now. That was her first flare for many months.  Uses albuterol rarely.  She is on singulair, not on zyrtec. Not on omeprazole. She is on lisinopril.   Acute 08/01/17 --follow-up visit for patient with a history of COPD, allergic rhinitis, hypertension (formerly on ACE inhibitor), nocturnal hypoxemia without documented sleep apnea.  Also with pulmonary nodules that we are following by serial CT scan as above.  She presents today for an acute visit complaining of about 1 month of progressive dyspnea, happens in the am and with activity. Some improvement with albuterol HFA. Her cough is better - lisinopril was changed since last time. Her medicaid is being changed to family plan, she needs to do the paperwork to get her meds covered again.   ROV 09/01/17 --patient follows up today for her COPD, allergic rhinitis.  She also has a history of cough that has been improved, pulmonary nodules that we followed with serial CT scans.  At her last visit she was experiencing more progressive dyspnea and I changed Perforomist and budesonide nebulized treatments to Bevespi. She  has noted some short-lived benefit that seems to wear off, but less exertional SOB. Having more congestion over the last week. She is on singulair. She is working on Education officer, environmental and disability. She is worried about cost of any allergy meds.   ROV 02/02/18 --this is a follow-up visit for 47 year old woman with COPD, allergic rhinitis, nocturnal hypoxemia and pulmonary nodular disease that we have followed with serial CT scans of the chest.  Her most recent was 11/27/2017 and showed a stable right upper lobe groundglass nodule.  We are planning to do one final follow-up in July 2020.  At her last visit we tried changing her to [facility] to see if she would benefit [name] was not covered by her insurance). She believes that she coughed more on the Stiolto.  We performed a walking oximetry today and she did not desaturate on room air, walked 407 m. She is interested in working with a new DME company > change from [facility] to [facility] in [place].    Review of Systems      Objective:   Physical Exam Vitals:   02/02/18 1541  BP: 130/70  Pulse: (!) 57  SpO2: 96%  Weight: 207 lb (93.9 kg)  Height: 5\' 4"  (1.626 m)   Gen: Pleasant, obese woman, in no distress,  normal affect  ENT: mouth clear,  oropharynx clear, no postnasal drip  Neck: No JVD, no stridor  Lungs: No use of accessory muscles, clear bilaterally  Cardiovascular: RRR, heart sounds normal, no murmur or gallops, no peripheral  edema  Musculoskeletal: No deformities, no cyanosis or clubbing  Neuro: alert, non focal  Skin: Warm, no lesions or rashes    CT scan chest 11/25/16 --  COMPARISON:  None.  FINDINGS: Cardiovascular: Limited evaluation in the absence of intravenous contrast. Trace calcifications along the course of the left anterior descending coronary artery. The heart is normal in size. No pericardial effusion.  Mediastinum/Nodes: Unremarkable CT appearance of the thyroid gland. No suspicious mediastinal or hilar  adenopathy. No soft tissue mediastinal mass. The thoracic esophagus is unremarkable.  Lungs/Pleura: Patchy focus of ground-glass attenuation airspace opacity in the right upper lung measures 1.8 x 0.8 cm in greatest dimension. Similar findings were present on the prior CT scan from February of 2017. No significant interval change. The lungs are otherwise clear. No significant emphysematous changes or bronchial wall thickening.  Upper Abdomen: The gallbladder is surgically absent.  Musculoskeletal: No acute fracture or aggressive appearing lytic or blastic osseous lesion.  IMPRESSION: 1. Patchy focus of ground-glass attenuation airspace opacity in the right upper lung measuring approximately 1.8 x 0.8 cm in greatest dimensions. Similar findings were present on the prior CT scan from February of 2017. As such, this may simply represent a region of residual pleuroparenchymal scarring. However, low grade indolent adenocarcinoma can have a similar appearance and continued follow-up is recommended. Repeat CT is recommended every 2 years until 5 years of stability has been established. This recommendation follows the consensus statement: Guidelines for Management of Incidental Pulmonary Nodules Detected on CT Images:From the Fleischner Society 2017; published online before print (10.1148/radiol.9563875643). 2. Trace coronary artery calcifications. Please note that although the presence of coronary artery calcium documents the presence of coronary artery disease, the severity of this disease and any potential stenosis cannot be assessed on this non-gated CT examination. Assessment for potential risk factor modification, dietary therapy or pharmacologic therapy may be warranted, if clinically indicated. 3. Surgical changes of prior cholecystectomy.     Assessment & Plan:  COPD (chronic obstructive pulmonary disease) (HCC) Therapeutic trial of Spiriva.  She felt that her cough was  worse on this medication.  She thought she tolerated the Bevespi and benefited more from the [place].  She now has Medicaid and we will try changing her back to [facility].  She will continue use albuterol as needed.  Talked to her today about pulmonary rehabilitation or initiate an exercise regimen  Nocturnal hypoxemia She is interested in changing oxygen DME companies.   We will work on trying to change your home oxygen company to [facility] from [facility].  You may need to have a repeat overnight oximetry on room air in order to make this change.  If so we will order it for you. Follow with Dr Delton Coombes in 6 months or sooner if you have any problems  Lung nodule < 6cm on CT She needs a final CT chest to follow-up for interval stability in July 2020  Levy Pupa, MD, PhD 02/02/2018, 4:20 PM [facility] [phone] or if no answer [phone]

## 2018-02-05 MED ORDER — TIOTROPIUM BROMIDE-OLODATEROL 2.5-2.5 MCG/ACT IN AERS: 2.0000 | INHALATION_SPRAY | Freq: Every day | RESPIRATORY_TRACT | 5 refills | Status: DC

## 2018-02-23 ENCOUNTER — Ambulatory Visit: Payer: Medicaid Other | Admitting: "Endocrinology

## 2018-02-23 ENCOUNTER — Encounter: Payer: Self-pay | Admitting: "Endocrinology

## 2018-02-23 VITALS — BP 145/84 | HR 84 | Ht 64.0 in | Wt 207.0 lb

## 2018-02-23 DIAGNOSIS — E0590 Thyrotoxicosis, unspecified without thyrotoxic crisis or storm: Secondary | ICD-10-CM | POA: Diagnosis not present

## 2018-02-23 MED ORDER — PROPRANOLOL HCL 20 MG PO TABS: 20.0000 mg | ORAL_TABLET | Freq: Two times a day (BID) | ORAL | 2 refills | Status: DC

## 2018-02-23 NOTE — Progress Notes (Signed)
Endocrinology follow-up note  Subjective:    Patient ID: Sherry Glass, female    DOB: 1970-12-29, PCP Sherry Balloon, [name].   Past Medical History:  Diagnosis Date  . Allergic rhinitis   . Anxiety and depression   . Cataract   . Chest pain syndrome   . COPD (chronic obstructive pulmonary disease) ([place])   . Depression   . Diabetes mellitus without complication ([place])   . History of kidney stones   . HTN (hypertension)   . Hyperglycemia   . Hypertension   . Obesity   . Restless leg syndrome   . Tobacco abuse    Past Surgical History:  Procedure Laterality Date  . ABDOMINAL HYSTERECTOMY    . CARDIAC CATHETERIZATION  2014  . CATARACT EXTRACTION W/PHACO Left 06/23/2016   Procedure: CATARACT EXTRACTION PHACO AND INTRAOCULAR LENS PLACEMENT (IOC);  Surgeon: Sherry Branch, MD;  Location: [facility];  Service: Ophthalmology;  Laterality: Left;  left - pt knows to arrive at 10:50 cde 9.11  . CESAREAN SECTION    . CHOLECYSTECTOMY    . EXCISION MASS NECK     cyst  . FOOT MASS EXCISION    . hysteectomy --- unknown    . left foot surgery     removal of cyst  . NECK SURGERY    . TONSILLECTOMY     Social History   Socioeconomic History  . Marital status: Widowed    Spouse name: Not on file  . Number of children: Not on file  . Years of education: Not on file  . Highest education level: Not on file  Occupational History  . Not on file  Social Needs  . Financial resource strain: Not on file  . Food insecurity:    Worry: Not on file    Inability: Not on file  . Transportation needs:    Medical: Not on file    Non-medical: Not on file  Tobacco Use  . Smoking status: Former Smoker    Packs/day: 1.00    Years: 25.00    Pack years: 25.00    Types: Cigarettes    Start date: 11/13/1988    Last attempt to quit: 10/28/2016    Years since quitting: 1.3  . Smokeless tobacco: Never Used  Substance and Sexual Activity  . Alcohol use: Yes    Comment: For special occasions  . Drug  use: No  . Sexual activity: Never    Birth control/protection: Surgical  Lifestyle  . Physical activity:    Days per week: Not on file    Minutes per session: Not on file  . Stress: Not on file  Relationships  . Social connections:    Talks on phone: Not on file    Gets together: Not on file    Attends religious service: Not on file    Active member of club or organization: Not on file    Attends meetings of clubs or organizations: Not on file    Relationship status: Not on file  Other Topics Concern  . Not on file  Social History Narrative   [facility] (11/18/16):   Originally from [place] but grew up in [place]. Cared for her husband until he passed. 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 Does have significant dust exposure. Has 3 dogs currently. Previously had a parakeet as a child. No mold exposure. Enjoys playing with her grandchildren.        Outpatient Encounter Medications  as of 02/23/2018  Medication Sig  . ACCU-CHEK FASTCLIX LANCETS MISC USE TO check blood glucose twice daily  . albuterol (PROVENTIL) (2.5 MG/3ML) 0.083% nebulizer solution Use 1 vial (3 mL) via nebulizer every 6 hours as needed for wheezing or shortness of breath  . aspirin EC 81 MG tablet Take one po qd  . budesonide (PULMICORT) 0.5 MG/2ML nebulizer solution Take 2 mLs (0.5 mg total) 2 (two) times daily by nebulization.  . cyclobenzaprine (FLEXERIL) 10 MG tablet Take 1 Tablet by mouth 3 times a day as needed for muscle spasms  . escitalopram (LEXAPRO) 20 MG tablet Take 1 tablet (20 mg total) by mouth daily.  Marland Kitchen gabapentin (NEURONTIN) 300 MG capsule Take 1 capsule (300 mg total) by mouth 2 (two) times daily.  Marland Kitchen glimepiride (AMARYL) 2 MG tablet Take 1 tablet (2 mg total) by mouth daily with breakfast.  . glucose blood (ACCU-CHEK GUIDE) test strip USE TO check blood glucose twice daily  . losartan (COZAAR) 25 MG tablet Take 1 tablet (25 mg total) by mouth daily.   . metFORMIN (GLUCOPHAGE) 1000 MG tablet Take 1 tablet (1,000 mg total) by mouth 2 (two) times daily with a meal.  . nitroGLYCERIN (NITROSTAT) 0.4 MG SL tablet Place 1 tablet (0.4 mg total) under the tongue every 5 (five) minutes as needed for chest pain.  . pravastatin (PRAVACHOL) 20 MG tablet Take 1 tablet (20 mg total) by mouth daily.  . propranolol (INDERAL) 20 MG tablet Take 1 tablet (20 mg total) by mouth 2 (two) times daily.  . Tiotropium Bromide-Olodaterol (STIOLTO RESPIMAT) 2.5-2.5 MCG/ACT AERS Inhale 2 puffs into the lungs daily.  . Vitamin D, Ergocalciferol, (DRISDOL) 50000 units CAPS capsule Take 1 capsule (50,000 Units total) by mouth every 7 (seven) days.   No facility-administered encounter medications on file as of 02/23/2018.    ALLERGIES: Allergies  Allergen Reactions  . Dilaudid [Hydromorphone]     Itching and vomitting  . Hydromorphone   . Valproic Acid And Related    VACCINATION STATUS: Immunization History  Administered Date(s) Administered  . Influenza,inj,Quad PF,6+ Mos 04/27/2017  . Pneumococcal Polysaccharide-23 05/02/2005    HPI  Sherry Glass was previously seen in this clinic for subclinical hyperthyroidism.  She was last seen in March 2018, was supposed to return within few months with repeat thyroid function tests.  Unfortunately, she could not return due to loss of her insurance coverage.   -She is being rereferred for hypothyroidism.  She is not on antithyroid medications, her TSH was undetectable in April 2019. -She denies weight loss, palpitations, no tremors.  She reports heat intolerance, worsening of hot flashes. -24-hour thyroid uptake and scan was unremarkable in March 2018 with 25% uptake with no focal areas of increased or decreased tracer localization.  The patient denies family history of thyroid dysfunction- she is adopted. - Patient is not on any anti-thyroid medications nor on any thyroid hormone supplements. Patient  is willing to proceed  with appropriate work up and therapy for thyrotoxicosis. - She has well-controlled type 2 diabetes with A1c of 6.1% on January 30, 2018.  She remains on metformin and glimepiride .  - She reports that she has quit smoking after decades of heavy smoking.     Review of Systems   Constitutional: + steady weight ,  no fatigue, + subjective hyperthermia Eyes: no blurry vision, no xerophthalmia ENT: no sore throat, no nodules palpated in throat, no dysphagia/odynophagia, no hoarseness Cardiovascular: no Chest Pain, no Shortness of  Breath, - palpitations, no leg swelling Respiratory: no cough, no SOB Gastrointestinal: no Nausea/Vomiting/Diarhhea Musculoskeletal: no muscle/joint aches Skin: no rashes Neurological: - tremors, no numbness, no tingling, no dizziness Psychiatric: no depression, + anxiety  Objective:    BP (!) 145/84   Pulse 84   Ht '5\' 4"'$  (1.626 m)   Wt 207 lb (93.9 kg)   SpO2 97%   BMI 35.53 kg/m   Wt Readings from Last 3 Encounters:  02/23/18 207 lb (93.9 kg)  02/02/18 207 lb (93.9 kg)  01/30/18 206 lb 9.6 oz (93.7 kg)    Physical Exam Constitutional:  + obese, not in acute distress. Eyes: PERRLA, EOMI, no exophthalmos ENT: moist mucous membranes, thyroid is palpable -mild thyromegaly, no cervical lymphadenopathy Cardiovascular: Tachycardia resolved, no Murmur/Rubs/Gallops Respiratory:  adequate breathing efforts, no gross chest deformity, Clear to auscultation bilaterally Gastrointestinal: abdomen soft, Non -tender, No distension, Bowel Sounds present Musculoskeletal: no gross deformities, strength intact in all four extremities Skin: moist, warm, no rashes Neurological: + tremor with outstretched hands.   Results for orders placed or performed in visit on 01/30/18  Bayer DCA Hb A1c Waived  Result Value Ref Range   HB A1C (BAYER DCA - WAIVED) 6.1 <7.0 %  CMP14+EGFR  Result Value Ref Range   Glucose 103 (H) 65 - 99 mg/dL   BUN 9 6 - 24 mg/dL   Creatinine, Ser  0.46 (L) 0.57 - 1.00 mg/dL   GFR calc non Af Amer 117 >59 mL/min/1.73   GFR calc Af Amer 135 >59 mL/min/1.73   BUN/Creatinine Ratio 20 9 - 23   Sodium 141 134 - 144 mmol/L   Potassium 4.5 3.5 - 5.2 mmol/L   Chloride 100 96 - 106 mmol/L   CO2 25 20 - 29 mmol/L   Calcium 10.3 (H) 8.7 - 10.2 mg/dL   Total Protein 7.1 6.0 - 8.5 g/dL   Albumin 4.6 3.5 - 5.5 g/dL   Globulin, Total 2.5 1.5 - 4.5 g/dL   Albumin/Globulin Ratio 1.8 1.2 - 2.2   Bilirubin Total 0.9 0.0 - 1.2 mg/dL   Alkaline Phosphatase 94 39 - 117 IU/L   AST 20 0 - 40 IU/L   ALT 28 0 - 32 IU/L  Lipid panel  Result Value Ref Range   Cholesterol, Total 156 100 - 199 mg/dL   Triglycerides 220 (H) 0 - 149 mg/dL   HDL 44 >39 mg/dL   VLDL Cholesterol Cal 44 (H) 5 - 40 mg/dL   LDL Calculated 68 0 - 99 mg/dL   Chol/HDL Ratio 3.5 0.0 - 4.4 ratio  CBC with Differential/Platelet  Result Value Ref Range   WBC 10.2 3.4 - 10.8 x10E3/uL   RBC 4.78 3.77 - 5.28 x10E6/uL   Hemoglobin 14.2 11.1 - 15.9 g/dL   Hematocrit 41.5 34.0 - 46.6 %   MCV 87 79 - 97 fL   MCH 29.7 26.6 - 33.0 pg   MCHC 34.2 31.5 - 35.7 g/dL   RDW 13.0 12.3 - 15.4 %   Platelets 295 150 - 450 x10E3/uL   Neutrophils 53 Not Estab. %   Lymphs 39 Not Estab. %   Monocytes 7 Not Estab. %   Eos 1 Not Estab. %   Basos 0 Not Estab. %   Neutrophils Absolute 5.3 1.4 - 7.0 x10E3/uL   Lymphocytes Absolute 4.0 (H) 0.7 - 3.1 x10E3/uL   Monocytes Absolute 0.7 0.1 - 0.9 x10E3/uL   EOS (ABSOLUTE) 0.1 0.0 - 0.4 x10E3/uL   Basophils Absolute  0.0 0.0 - 0.2 x10E3/uL   Immature Granulocytes 0 Not Estab. %   Immature Grans (Abs) 0.0 0.0 - 0.1 x10E3/uL   Complete Blood Count (Most recent): Lab Results  Component Value Date   WBC 10.2 01/30/2018   HGB 14.2 01/30/2018   HCT 41.5 01/30/2018   MCV 87 01/30/2018   PLT 295 01/30/2018   Chemistry (most recent): Lab Results  Component Value Date   NA 141 01/30/2018   K 4.5 01/30/2018   CL 100 01/30/2018   CO2 25 01/30/2018   BUN  9 01/30/2018   CREATININE 0.46 (L) 01/30/2018   Lipid Panel     Component Value Date/Time   CHOL 156 01/30/2018 1626   TRIG 220 (H) 01/30/2018 1626   HDL 44 01/30/2018 1626   CHOLHDL 3.5 01/30/2018 1626   LDLCALC 68 01/30/2018 1626    Results for KAITY, PITSTICK (MRN [id number]) as of 02/23/2018 18:54  Ref. Range 05/13/2016 10:34 06/09/2016 09:21 08/15/2017 15:06  TSH Latest Ref Range: 0.450 - 4.500 uIU/mL <0.006 (L) <0.006 (L) <0.006 (L)  Thyroxine (T4) Latest Ref Range: 4.5 - 12.0 ug/dL 9.8 10.2   Free Thyroxine Index Latest Ref Range: 1.2 - 4.9  3.2 3.3   T3 Uptake Ratio Latest Ref Range: 24 - 39 % 33 32    - Thyroid uptake and scan on 07/19/2016 was nonfocal with 25% uptake and 24 hours.  Assessment & Plan:   1. Subclinical Hyperthyroidism  -This patient was previously seen for subclinical hyperthyroidism with normal thyroid uptake and scan.  She did not require and was not given definitive antithyroid treatment at that time but was given propanolol 20 mg p.o. twice daily.  And she was advised to return in few months with repeat thyroid function tests.  Unfortunately, she lost her insurance and  she did not stay on this medication, did not return for follow-up.  Most recent labs involves only TSH on August 15, 2017 when it was undetectable.   Clinically she has minimal signs of hyperthyroidism.  She does not have sufficient work-up to proceed with definitive treatment plan.  -She will be sent for full profile thyroid function test today and thyroid uptake and scan as soon as possible.  She will return in 10 days for reevaluation and treatment decisions if necessary.  She will be given a prescription for propranolol 20 mg p.o. twice daily for symptomatic control.   -She is urged to engage for this work-up and treatment to avoid complications of untreated thyrotoxicosis.  -Options of treatment for primary hyperhyroidism are briefly discussed with her.  If she is confirmed to have primary  hyperthyroidism, she will be considered for I-131 thyroid ablation.  - I advised patient to maintain close follow up with Sherry Balloon, [name] for primary care needs, including diabetes care.   - Time spent with the patient: 25 min, of which >50% was spent in reviewing her  current and  previous labs, previous treatments, and medications doses and developing a plan for long-term care.  Seward Meth participated in the discussions, expressed understanding, and voiced agreement with the above plans.  All questions were answered to her satisfaction. she is encouraged to contact clinic should she have any questions or concerns prior to her return visit.  Follow up plan: Return in about 10 days (around 03/05/2018) for Labs Today- Non-Fasting Ok, Follow up with Thyroid Uptake and Scan.  Glade Lloyd, MD Phone: [phone]  Fax: [phone]   02/23/2018, 6:51 PM

## 2018-02-26 ENCOUNTER — Encounter: Payer: Self-pay | Admitting: Family

## 2018-02-26 ENCOUNTER — Ambulatory Visit: Payer: Medicaid Other | Admitting: Family

## 2018-02-26 VITALS — BP 135/78 | HR 85 | Temp 98.9°F | Ht 64.0 in | Wt 207.8 lb

## 2018-02-26 DIAGNOSIS — H109 Unspecified conjunctivitis: Secondary | ICD-10-CM

## 2018-02-26 DIAGNOSIS — J441 Chronic obstructive pulmonary disease with (acute) exacerbation: Secondary | ICD-10-CM | POA: Diagnosis not present

## 2018-02-26 LAB — T3, FREE: T3, Free: 7.1 pg/mL — ABNORMAL HIGH (ref 2.3–4.2)

## 2018-02-26 LAB — THYROGLOBULIN ANTIBODY: Thyroglobulin Ab: 1 IU/mL (ref <=1)

## 2018-02-26 LAB — T4, FREE: Free T4: 1.7 ng/dL (ref 0.8–1.8)

## 2018-02-26 LAB — THYROID PEROXIDASE ANTIBODY: Thyroperoxidase Ab SerPl-aCnc: >900 IU/mL — ABNORMAL HIGH (ref <9)

## 2018-02-26 LAB — TSH: TSH: 0.02 mIU/L — ABNORMAL LOW

## 2018-02-26 MED ORDER — AZITHROMYCIN 250 MG PO TABS: ORAL_TABLET | ORAL | 0 refills | Status: DC

## 2018-02-26 MED ORDER — PREDNISONE 10 MG (21) PO TBPK: ORAL_TABLET | ORAL | 0 refills | Status: DC

## 2018-02-26 MED ORDER — POLYMYXIN B-TRIMETHOPRIM 10000-0.1 UNIT/ML-% OP SOLN: 1.0000 [drp] | OPHTHALMIC | 0 refills | Status: DC

## 2018-02-26 NOTE — Progress Notes (Signed)
Subjective:    Patient ID: Sherry Glass, female    DOB: Nov 11, 1970, 47 y.o.   MRN: [id number]  Chief Complaint  Patient presents with  . Cough    chest congestion for four days  . left eye pink    Cough  This is a new problem. The current episode started in the past 7 days. The problem has been gradually worsening. The problem occurs every few minutes. The cough is non-productive. Associated symptoms include chills, ear congestion (right), eye redness, a fever, headaches, myalgias, nasal congestion, postnasal drip, rhinorrhea, a sore throat, shortness of breath and wheezing. The symptoms are aggravated by lying down. She has tried rest and OTC cough suppressant for the symptoms. The treatment provided mild relief. Her past medical history is significant for COPD.  Conjunctivitis   The current episode started 2 days ago. The onset was sudden. The problem occurs continuously. The problem has been gradually worsening. The problem is moderate. The symptoms are relieved by rest. Associated symptoms include a fever, photophobia, congestion, headaches, rhinorrhea, sore throat, cough, wheezing, eye discharge and eye redness. Pertinent negatives include no ear discharge.      Review of Systems  Constitutional: Positive for chills and fever.  HENT: Positive for congestion, postnasal drip, rhinorrhea and sore throat. Negative for ear discharge.   Eyes: Positive for photophobia, discharge and redness.  Respiratory: Positive for cough, shortness of breath and wheezing.   Musculoskeletal: Positive for myalgias.  Neurological: Positive for headaches.  All other systems reviewed and are negative.      Objective:   Physical Exam  Constitutional: She is oriented to person, place, and time. She appears well-developed and well-nourished. No distress.  HENT:  Head: Normocephalic and atraumatic.  Right Ear: External ear normal.  Left Ear: External ear normal.  Nose: Mucosal edema and rhinorrhea  present.  Mouth/Throat: Posterior oropharyngeal erythema present.  Eyes: Pupils are equal, round, and reactive to light. Left eye exhibits discharge. Left conjunctiva is injected.  Neck: Normal range of motion. Neck supple. No thyromegaly present.  Cardiovascular: Normal rate, regular rhythm, normal heart sounds and intact distal pulses.  No murmur heard. Pulmonary/Chest: Effort normal. No respiratory distress. She has no wheezes. She has rhonchi.  Abdominal: Soft. Bowel sounds are normal. She exhibits no distension. There is no tenderness.  Musculoskeletal: Normal range of motion. She exhibits no edema or tenderness.  Neurological: She is alert and oriented to person, place, and time. She has normal reflexes. No cranial nerve deficit.  Skin: Skin is warm and dry.  Psychiatric: She has a normal mood and affect. Her behavior is normal. Judgment and thought content normal.  Vitals reviewed.     BP 135/78   Pulse 85   Temp 98.9 F (37.2 C) (Oral)   Ht 5\' 4"  (1.626 m)   Wt 207 lb 12.8 oz (94.3 kg)   SpO2 98%   BMI 35.67 kg/m      Assessment & Plan:  Sherry Glass comes in today with chief complaint of Cough (chest congestion for four days) and left eye pink   Diagnosis and orders addressed:  1. COPD exacerbation (HCC) Strict low carb diet, since starting prednisone  Will give Zpak  Continue Nebulizers as needed and inhalers RTO if symptoms worsen or do not improve  - predniSONE (STERAPRED UNI-PAK 21 TAB) 10 MG (21) TBPK tablet; Use as directed  Dispense: 21 tablet; Refill: 0 - azithromycin (ZITHROMAX) 250 MG tablet; Take 500 mg once, then 250  mg for four days  Dispense: 6 tablet; Refill: 0  2. Bacterial conjunctivitis of left eye Good hand hygiene discussed Do not rub eyes Cool compromises as needed - trimethoprim-polymyxin b (POLYTRIM) ophthalmic solution; Place 1 drop into the left eye every 4 (four) hours.  Dispense: 10 mL; Refill: 0   Jannifer Rodney, [name]

## 2018-02-26 NOTE — Patient Instructions (Signed)

## 2018-03-02 ENCOUNTER — Other Ambulatory Visit: Payer: Self-pay | Admitting: Family

## 2018-03-02 MED ORDER — DOXYCYCLINE HYCLATE 100 MG PO TABS: 100.0000 mg | ORAL_TABLET | Freq: Two times a day (BID) | ORAL | 0 refills | Status: DC

## 2018-03-05 ENCOUNTER — Encounter (HOSPITAL_COMMUNITY): Payer: Self-pay

## 2018-03-05 ENCOUNTER — Encounter (HOSPITAL_COMMUNITY)
Admission: RE | Admit: 2018-03-05 | Discharge: 2018-03-05 | Disposition: A | Discharge status: Home / Self Care | Payer: Medicaid Other | Source: Ambulatory Visit | Attending: "Endocrinology | Admitting: "Endocrinology

## 2018-03-05 DIAGNOSIS — E0590 Thyrotoxicosis, unspecified without thyrotoxic crisis or storm: Secondary | ICD-10-CM | POA: Insufficient documentation

## 2018-03-05 MED ORDER — SODIUM IODIDE I 131 CAPSULE
11.0000 | Freq: Once | INTRAVENOUS | Status: AC | PRN
  Administered 2018-03-05: 11 via ORAL

## 2018-03-06 ENCOUNTER — Encounter (HOSPITAL_COMMUNITY)
Admission: RE | Admit: 2018-03-06 | Discharge: 2018-03-06 | Disposition: A | Discharge status: Home / Self Care | Payer: Medicaid Other | Source: Ambulatory Visit | Attending: "Endocrinology | Admitting: "Endocrinology

## 2018-03-06 DIAGNOSIS — E0590 Thyrotoxicosis, unspecified without thyrotoxic crisis or storm: Secondary | ICD-10-CM | POA: Diagnosis not present

## 2018-03-06 IMAGING — NM NM THYROID IMAGING W/ UPTAKE SINGLE (24 HR)
4 series · 4 of 4 positions shown · non-contrast
Comparison: None

CLINICAL DATA: Hyperthyroidism, TSH =

EXAM:
THYROID SCAN AND UPTAKE - 24 HOURS
TECHNIQUE: Following the per oral administration of P-JAF sodium iodide, the
patient returned at 24 hours and uptake measurements were acquired
with the uptake probe centered on the neck. Thyroid imaging was
performed following the intravenous administration of the Ec-88m
Pertechnetate.
RADIOPHARMACEUTICALS:  385 microCuries P-JAF sodium iodide orally

[Series 1: ant w marker · 1.18mm/px · 1 of 1 slices shown]
[im 1/1]
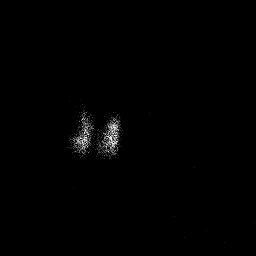

[Series 2: anterior · 1.18mm/px · 1 of 1 slices shown]
[im 1/1]
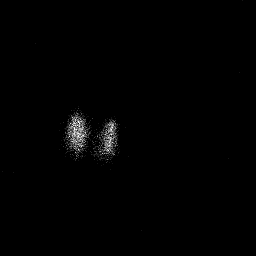

[Series 3: lao · 1.18mm/px · 1 of 1 slices shown]
[im 1/1]
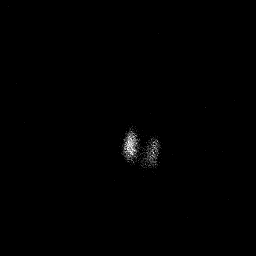

[Series 4: rao · 1.18mm/px · 1 of 1 slices shown]
[im 1/1]
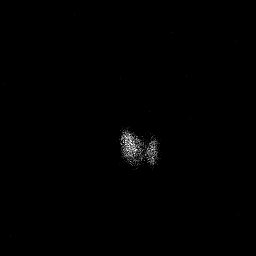

[4 of 4 positions shown; findings below may reference images not displayed]

FINDINGS: 24 hour radio iodine uptake is calculated at 25%, which is within
the normal range.

Images of the thyroid gland in 3 projections are normal.

No focal areas of increased or decreased tracer localization seen.
IMPRESSION: Normal 24 hour radio iodine uptake of 25%.

Normal thyroid scan.

## 2018-03-06 MED ORDER — SODIUM PERTECHNETATE TC 99M INJECTION
10.0000 | Freq: Once | INTRAVENOUS | Status: AC | PRN
  Administered 2018-03-06: 9.3 via INTRAVENOUS

## 2018-03-09 ENCOUNTER — Encounter: Payer: Self-pay | Admitting: "Endocrinology

## 2018-03-09 ENCOUNTER — Ambulatory Visit: Payer: Medicaid Other | Admitting: "Endocrinology

## 2018-03-09 VITALS — BP 145/83 | HR 90 | Ht 64.0 in | Wt 204.0 lb

## 2018-03-09 DIAGNOSIS — E0590 Thyrotoxicosis, unspecified without thyrotoxic crisis or storm: Secondary | ICD-10-CM

## 2018-03-09 MED ORDER — METHIMAZOLE 10 MG PO TABS: 10.0000 mg | ORAL_TABLET | Freq: Three times a day (TID) | ORAL | 4 refills | Status: DC

## 2018-03-09 NOTE — Progress Notes (Signed)
Endocrinology follow-up note  Subjective:    Patient ID: Sherry Glass, female    DOB: 1970-12-29, PCP Sharion Balloon, [name].   Past Medical History:  Diagnosis Date  . Allergic rhinitis   . Anxiety and depression   . Cataract   . Chest pain syndrome   . COPD (chronic obstructive pulmonary disease) ([place])   . Depression   . Diabetes mellitus without complication ([place])   . History of kidney stones   . HTN (hypertension)   . Hyperglycemia   . Hypertension   . Obesity   . Restless leg syndrome   . Tobacco abuse    Past Surgical History:  Procedure Laterality Date  . ABDOMINAL HYSTERECTOMY    . CARDIAC CATHETERIZATION  2014  . CATARACT EXTRACTION W/PHACO Left 06/23/2016   Procedure: CATARACT EXTRACTION PHACO AND INTRAOCULAR LENS PLACEMENT (IOC);  Surgeon: Tonny Branch, MD;  Location: [facility];  Service: Ophthalmology;  Laterality: Left;  left - pt knows to arrive at 10:50 cde 9.11  . CESAREAN SECTION    . CHOLECYSTECTOMY    . EXCISION MASS NECK     cyst  . FOOT MASS EXCISION    . hysteectomy --- unknown    . left foot surgery     removal of cyst  . NECK SURGERY    . TONSILLECTOMY     Social History   Socioeconomic History  . Marital status: Widowed    Spouse name: Not on file  . Number of children: Not on file  . Years of education: Not on file  . Highest education level: Not on file  Occupational History  . Not on file  Social Needs  . Financial resource strain: Not on file  . Food insecurity:    Worry: Not on file    Inability: Not on file  . Transportation needs:    Medical: Not on file    Non-medical: Not on file  Tobacco Use  . Smoking status: Former Smoker    Packs/day: 1.00    Years: 25.00    Pack years: 25.00    Types: Cigarettes    Start date: 11/13/1988    Last attempt to quit: 10/28/2016    Years since quitting: 1.3  . Smokeless tobacco: Never Used  Substance and Sexual Activity  . Alcohol use: Yes    Comment: For special occasions  . Drug  use: No  . Sexual activity: Never    Birth control/protection: Surgical  Lifestyle  . Physical activity:    Days per week: Not on file    Minutes per session: Not on file  . Stress: Not on file  Relationships  . Social connections:    Talks on phone: Not on file    Gets together: Not on file    Attends religious service: Not on file    Active member of club or organization: Not on file    Attends meetings of clubs or organizations: Not on file    Relationship status: Not on file  Other Topics Concern  . Not on file  Social History Narrative   [facility] (11/18/16):   Originally from [place] but grew up in [place]. Cared for her husband until he passed. 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 Does have significant dust exposure. Has 3 dogs currently. Previously had a parakeet as a child. No mold exposure. Enjoys playing with her grandchildren.        Outpatient Encounter Medications  as of 03/09/2018  Medication Sig  . ACCU-CHEK FASTCLIX LANCETS MISC USE TO check blood glucose twice daily  . albuterol (PROVENTIL) (2.5 MG/3ML) 0.083% nebulizer solution Use 1 vial (3 mL) via nebulizer every 6 hours as needed for wheezing or shortness of breath  . aspirin EC 81 MG tablet Take one po qd  . azithromycin (ZITHROMAX) 250 MG tablet Take 500 mg once, then 250 mg for four days  . budesonide (PULMICORT) 0.5 MG/2ML nebulizer solution Take 2 mLs (0.5 mg total) 2 (two) times daily by nebulization.  . cyclobenzaprine (FLEXERIL) 10 MG tablet Take 1 Tablet by mouth 3 times a day as needed for muscle spasms  . doxycycline (VIBRA-TABS) 100 MG tablet Take 1 tablet (100 mg total) by mouth 2 (two) times daily.  Marland Kitchen escitalopram (LEXAPRO) 20 MG tablet Take 1 tablet (20 mg total) by mouth daily.  Marland Kitchen gabapentin (NEURONTIN) 300 MG capsule Take 1 capsule (300 mg total) by mouth 2 (two) times daily.  Marland Kitchen glimepiride (AMARYL) 2 MG tablet Take 1 tablet (2 mg total) by mouth  daily with breakfast.  . glucose blood (ACCU-CHEK GUIDE) test strip USE TO check blood glucose twice daily  . losartan (COZAAR) 25 MG tablet Take 1 tablet (25 mg total) by mouth daily.  . metFORMIN (GLUCOPHAGE) 1000 MG tablet Take 1 tablet (1,000 mg total) by mouth 2 (two) times daily with a meal.  . methimazole (TAPAZOLE) 10 MG tablet Take 1 tablet (10 mg total) by mouth 3 (three) times daily.  . nitroGLYCERIN (NITROSTAT) 0.4 MG SL tablet Place 1 tablet (0.4 mg total) under the tongue every 5 (five) minutes as needed for chest pain.  . pravastatin (PRAVACHOL) 20 MG tablet Take 1 tablet (20 mg total) by mouth daily.  . predniSONE (STERAPRED UNI-PAK 21 TAB) 10 MG (21) TBPK tablet Use as directed  . propranolol (INDERAL) 20 MG tablet Take 1 tablet (20 mg total) by mouth 2 (two) times daily.  . Tiotropium Bromide-Olodaterol (STIOLTO RESPIMAT) 2.5-2.5 MCG/ACT AERS Inhale 2 puffs into the lungs daily.  Marland Kitchen trimethoprim-polymyxin b (POLYTRIM) ophthalmic solution Place 1 drop into the left eye every 4 (four) hours.  . Vitamin D, Ergocalciferol, (DRISDOL) 50000 units CAPS capsule Take 1 capsule (50,000 Units total) by mouth every 7 (seven) days.   No facility-administered encounter medications on file as of 03/09/2018.    ALLERGIES: Allergies  Allergen Reactions  . Dilaudid [Hydromorphone]     Itching and vomitting  . Hydromorphone   . Valproic Acid And Related    VACCINATION STATUS: Immunization History  Administered Date(s) Administered  . Influenza,inj,Quad PF,6+ Mos 04/27/2017  . Pneumococcal Polysaccharide-23 05/02/2005    HPI  Mrs. Crumby returns to clinic with repeat thyroid function test and thyroid uptake and scan to follow-up for hyperthyroidism.    -She is not on antithyroid medications.  Her repeat thyroid function tests are consistent with T3 toxicosis.  Her uptake and scan reveals high normal uptake of 30%, no focal increased or decreased activities. -She denies weight loss,  palpitations, no tremors.  She reports heat intolerance, worsening of hot flashes. -24-hour thyroid uptake and scan was unremarkable in March 2018 with 25% uptake with no focal areas of increased or decreased tracer localization.  The patient denies family history of thyroid dysfunction- she is adopted. -He is not on thyroid hormone supplements. Patient  is willing to proceed with appropriate therapy for thyrotoxicosis. - She has well-controlled type 2 diabetes with A1c of 6.1% on January 30, 2018.  She remains on metformin and glimepiride .  - She reports that she has quit smoking after decades of heavy smoking.     Review of Systems   Constitutional: + lost 3 pounds since last visit,  no fatigue, + subjective hyperthermia Eyes: no blurry vision, no xerophthalmia ENT: no sore throat, no nodules palpated in throat, no dysphagia/odynophagia, no hoarseness Cardiovascular: no Chest Pain, no Shortness of Breath, - palpitations, no leg swelling Musculoskeletal: no muscle/joint aches Skin: no rashes Neurological: - tremors, no numbness, no tingling, no dizziness Psychiatric: no depression, + anxiety  Objective:    BP (!) 145/83   Pulse 90   Ht _0  (1.626 m)   Wt 204 lb (92.5 kg)   BMI 35.02 kg/m   Wt Readings from Last 3 Encounters:  03/09/18 204 lb (92.5 kg)  02/26/18 207 lb 12.8 oz (94.3 kg)  02/23/18 207 lb (93.9 kg)    Physical Exam Constitutional:  + obese, not in acute distress. Eyes: PERRLA, EOMI, no exophthalmos ENT: moist mucous membranes, thyroid is palpable -mild thyromegaly, no cervical lymphadenopathy Cardiovascular: Tachycardia resolved, no Murmur/Rubs/Gallops Respiratory:  adequate breathing efforts, no gross chest deformity, Clear to auscultation bilaterally Gastrointestinal: abdomen soft, Non -tender, No distension, Bowel Sounds present Musculoskeletal: no gross deformities, strength intact in all four extremities Skin: moist, warm, no rashes Neurological: +  tremor with outstretched hands.   Lipid Panel     Component Value Date/Time   CHOL 156 01/30/2018 1626   TRIG 220 (H) 01/30/2018 1626   HDL 44 01/30/2018 1626   CHOLHDL 3.5 01/30/2018 1626   LDLCALC 68 01/30/2018 1626   Recent Results (from the past 2160 hour(s))  Bayer DCA Hb A1c Waived     Status: None   Collection Time: 01/30/18  4:26 PM  Result Value Ref Range   HB A1C (BAYER DCA - WAIVED) 6.1 <7.0 %    Comment:                                       Diabetic Adult            <7.0                                       Healthy Adult        4.3 - 5.7                                                           (DCCT/NGSP) American Diabetes Association's Summary of Glycemic Recommendations for Adults with Diabetes: Hemoglobin A1c <7.0%. More stringent glycemic goals (A1c <6.0%) may further reduce complications at the cost of increased risk of hypoglycemia.   CMP14+EGFR     Status: Abnormal   Collection Time: 01/30/18  4:26 PM  Result Value Ref Range   Glucose 103 (H) 65 - 99 mg/dL   BUN 9 6 - 24 mg/dL   Creatinine, Ser 0.46 (L) 0.57 - 1.00 mg/dL   GFR calc non Af Amer 117 >59 mL/min/1.73   GFR calc Af Amer 135 >59 mL/min/1.73   BUN/Creatinine Ratio 20 9 - 23   Sodium 141  134 - 144 mmol/L   Potassium 4.5 3.5 - 5.2 mmol/L   Chloride 100 96 - 106 mmol/L   CO2 25 20 - 29 mmol/L   Calcium 10.3 (H) 8.7 - 10.2 mg/dL   Total Protein 7.1 6.0 - 8.5 g/dL   Albumin 4.6 3.5 - 5.5 g/dL   Globulin, Total 2.5 1.5 - 4.5 g/dL   Albumin/Globulin Ratio 1.8 1.2 - 2.2   Bilirubin Total 0.9 0.0 - 1.2 mg/dL   Alkaline Phosphatase 94 39 - 117 IU/L   AST 20 0 - 40 IU/L   ALT 28 0 - 32 IU/L  Lipid panel     Status: Abnormal   Collection Time: 01/30/18  4:26 PM  Result Value Ref Range   Cholesterol, Total 156 100 - 199 mg/dL   Triglycerides 220 (H) 0 - 149 mg/dL   HDL 44 >39 mg/dL   VLDL Cholesterol Cal 44 (H) 5 - 40 mg/dL   LDL Calculated 68 0 - 99 mg/dL   Chol/HDL Ratio 3.5 0.0 - 4.4 ratio     Comment:                                   T. Chol/HDL Ratio                                             Men  Women                               1/2 Avg.Risk  3.4    3.3                                   Avg.Risk  5.0    4.4                                2X Avg.Risk  9.6    7.1                                3X Avg.Risk 23.4   11.0   CBC with Differential/Platelet     Status: Abnormal   Collection Time: 01/30/18  4:26 PM  Result Value Ref Range   WBC 10.2 3.4 - 10.8 x10E3/uL   RBC 4.78 3.77 - 5.28 x10E6/uL   Hemoglobin 14.2 11.1 - 15.9 g/dL   Hematocrit 41.5 34.0 - 46.6 %   MCV 87 79 - 97 fL   MCH 29.7 26.6 - 33.0 pg   MCHC 34.2 31.5 - 35.7 g/dL   RDW 13.0 12.3 - 15.4 %   Platelets 295 150 - 450 x10E3/uL   Neutrophils 53 Not Estab. %   Lymphs 39 Not Estab. %   Monocytes 7 Not Estab. %   Eos 1 Not Estab. %   Basos 0 Not Estab. %   Neutrophils Absolute 5.3 1.4 - 7.0 x10E3/uL   Lymphocytes Absolute 4.0 (H) 0.7 - 3.1 x10E3/uL   Monocytes Absolute 0.7 0.1 - 0.9 x10E3/uL   EOS (ABSOLUTE) 0.1 0.0 - 0.4 x10E3/uL   Basophils Absolute 0.0 0.0 - 0.2  x10E3/uL   Immature Granulocytes 0 Not Estab. %   Immature Grans (Abs) 0.0 0.0 - 0.1 x10E3/uL  TSH     Status: Abnormal   Collection Time: 02/23/18 10:32 AM  Result Value Ref Range   TSH 0.02 (L) mIU/L    Comment:           Reference Range .           > or = 20 Years  0.40-4.50 .                Pregnancy Ranges           First trimester    0.26-2.66           Second trimester   0.55-2.73           Third trimester    0.43-2.91   T4, free     Status: None   Collection Time: 02/23/18 10:32 AM  Result Value Ref Range   Free T4 1.7 0.8 - 1.8 ng/dL  T3, free     Status: Abnormal   Collection Time: 02/23/18 10:32 AM  Result Value Ref Range   T3, Free 7.1 (H) 2.3 - 4.2 pg/mL  Thyroid peroxidase antibody     Status: Abnormal   Collection Time: 02/23/18 10:32 AM  Result Value Ref Range   Thyroperoxidase Ab SerPl-aCnc >900 (H) <9  IU/mL  Thyroglobulin antibody     Status: None   Collection Time: 02/23/18 10:32 AM  Result Value Ref Range   Thyroglobulin Ab 1 < or = 1 IU/mL     - Thyroid uptake and scan on 07/19/2016 was nonfocal with 25% uptake and 24 hours.  March 05, 2018 thyroid uptake and scan: FINDINGS: Normal thyroid scan.  No hot or cold nodules are identified.  4 hour I-131 uptake = 17.6% (normal 5-20%) 24 hour I-131 uptake = 30.0% (normal 10-30%)  IMPRESSION: 1. Normal thyroid scan. 2. Top-normal 24 hour iodine 131 uptake at 30%.  Assessment & Plan:   1.  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  However, her uptake and scan is remarkable only for 30% uptake with no focal abnormalities.  She will not be given ablative treatment.  She is approached for Tapazole treatment and she agrees. I discussed and initiated Tapazole 10 mg p.o. daily with with breakfast and plan to repeat thyroid function test and office visit in 3 months.  Side effects and precautions of Tapazole discussed with her including a granulocytosis, transaminitis. -If her progress is not acceptable, she will be considered for radioactive iodine thyroid ablation on dental visits.  She will continue on  propranolol 20 mg p.o. twice daily for symptomatic control.   - I advised patient to maintain close follow up with Sharion Balloon, [name] for primary care needs, including diabetes care.   Follow up plan: Return in about 3 months (around 06/09/2018) for Follow up with Pre-visit Labs.  Glade Lloyd, MD Phone: [phone]  Fax: [phone]   03/09/2018, 12:36 PM

## 2018-03-12 ENCOUNTER — Telehealth: Payer: Self-pay | Admitting: "Endocrinology

## 2018-03-12 NOTE — Telephone Encounter (Signed)
Sherry Glass is stating that she started taking methimazole (TAPAZOLE) 10 MG tablet  Friday and she has started itching real bad, nauseated, throwed up yesterday, please advise?

## 2018-03-14 ENCOUNTER — Other Ambulatory Visit: Payer: Self-pay | Admitting: Family

## 2018-03-14 DIAGNOSIS — E1142 Type 2 diabetes mellitus with diabetic polyneuropathy: Secondary | ICD-10-CM

## 2018-03-19 ENCOUNTER — Other Ambulatory Visit: Payer: Self-pay | Admitting: "Endocrinology

## 2018-03-19 MED ORDER — METHIMAZOLE 5 MG PO TABS: 5.0000 mg | ORAL_TABLET | Freq: Three times a day (TID) | ORAL | 2 refills | Status: DC

## 2018-03-19 MED ORDER — METHIMAZOLE 5 MG PO TABS: 5.0000 mg | ORAL_TABLET | Freq: Every day | ORAL | 2 refills | Status: DC

## 2018-03-19 NOTE — Telephone Encounter (Signed)
I would like for her to try a lower dose of methimazole 5 mg p.o. daily.  I will send a prescription for methimazole 5 mg to her pharmacy.  If she still has problems with this, her next option is radioactive iodine thyroid ablation.

## 2018-03-20 NOTE — Telephone Encounter (Signed)
Patient is aware of the recommendation 

## 2018-03-23 ENCOUNTER — Telehealth: Payer: Self-pay | Admitting: "Endocrinology

## 2018-03-23 NOTE — Telephone Encounter (Signed)
Patient is aware of the recommendation 

## 2018-03-23 NOTE — Telephone Encounter (Signed)
Ok, she will be considered for radioactive iodine thyroid ablation which I need to discuss more with her.  Can stay off of methimazole, return for a visit as soon as possible.

## 2018-03-23 NOTE — Telephone Encounter (Signed)
Sherry Glass is calling stating that the methimazole (TAPAZOLE) 5 MG tablet  Is still making her sick, please advise?

## 2018-04-09 ENCOUNTER — Telehealth: Payer: Self-pay | Admitting: Emergency Medicine

## 2018-04-09 DIAGNOSIS — J449 Chronic obstructive pulmonary disease, unspecified: Secondary | ICD-10-CM

## 2018-04-09 NOTE — Telephone Encounter (Signed)
Call made to patient, patient states this has already been addressed via Mychart. Nothing further is needed at this time.

## 2018-04-19 ENCOUNTER — Other Ambulatory Visit: Payer: Self-pay | Admitting: Family

## 2018-04-19 DIAGNOSIS — E1142 Type 2 diabetes mellitus with diabetic polyneuropathy: Secondary | ICD-10-CM

## 2018-05-31 LAB — HM DIABETES EYE EXAM

## 2018-06-08 DIAGNOSIS — E0590 Thyrotoxicosis, unspecified without thyrotoxic crisis or storm: Secondary | ICD-10-CM | POA: Diagnosis not present

## 2018-06-08 LAB — T4, FREE: Free T4: 2 ng/dL — ABNORMAL HIGH (ref 0.8–1.8)

## 2018-06-08 LAB — T3, FREE: T3, Free: 8.2 pg/mL — ABNORMAL HIGH (ref 2.3–4.2)

## 2018-06-08 LAB — TSH: TSH: 0.01 mIU/L — ABNORMAL LOW

## 2018-06-15 ENCOUNTER — Encounter: Payer: Self-pay | Admitting: "Endocrinology

## 2018-06-15 ENCOUNTER — Ambulatory Visit (INDEPENDENT_AMBULATORY_CARE_PROVIDER_SITE_OTHER): Payer: Medicaid Other | Admitting: "Endocrinology

## 2018-06-15 VITALS — BP 149/85 | HR 93 | Ht 64.0 in | Wt 211.6 lb

## 2018-06-15 DIAGNOSIS — E0590 Thyrotoxicosis, unspecified without thyrotoxic crisis or storm: Secondary | ICD-10-CM | POA: Diagnosis not present

## 2018-06-15 MED ORDER — PROPRANOLOL HCL 20 MG PO TABS: 20.0000 mg | ORAL_TABLET | Freq: Two times a day (BID) | ORAL | 2 refills | Status: DC

## 2018-06-15 NOTE — Progress Notes (Signed)
Endocrinology follow-up note  Subjective:    Patient ID: Sherry Glass, female    DOB: [birth year], PCP Junie Spencer, [name].   Past Medical History:  Diagnosis Date  . Allergic rhinitis   . Anxiety and depression   . Cataract   . Chest pain syndrome   . COPD (chronic obstructive pulmonary disease) (HCC)   . Depression   . Diabetes mellitus without complication (HCC)   . History of kidney stones   . HTN (hypertension)   . Hyperglycemia   . Hypertension   . Obesity   . Restless leg syndrome   . Tobacco abuse    Past Surgical History:  Procedure Laterality Date  . ABDOMINAL HYSTERECTOMY    . CARDIAC CATHETERIZATION  2014  . CATARACT EXTRACTION W/PHACO Left 06/23/2016   Procedure: CATARACT EXTRACTION PHACO AND INTRAOCULAR LENS PLACEMENT (IOC);  Surgeon: Gemma Payor, MD;  Location: [facility];  Service: Ophthalmology;  Laterality: Left;  left - pt knows to arrive at 10:50 cde 9.11  . CESAREAN SECTION    . CHOLECYSTECTOMY    . EXCISION MASS NECK     cyst  . FOOT MASS EXCISION    . hysteectomy --- unknown    . left foot surgery     removal of cyst  . NECK SURGERY    . TONSILLECTOMY     Social History   Socioeconomic History  . Marital status: Widowed    Spouse name: Not on file  . Number of children: Not on file  . Years of education: Not on file  . Highest education level: Not on file  Occupational History  . Not on file  Social Needs  . Financial resource strain: Not on file  . Food insecurity:    Worry: Not on file    Inability: Not on file  . Transportation needs:    Medical: Not on file    Non-medical: Not on file  Tobacco Use  . Smoking status: Former Smoker    Packs/day: 1.00    Years: 25.00    Pack years: 25.00    Types: Cigarettes    Start date: 11/13/1988    Last attempt to quit: 10/28/2016    Years since quitting: 1.6  . Smokeless tobacco: Never Used  Substance and Sexual Activity  . Alcohol use: Yes    Comment: For special occasions  . Drug  use: No  . Sexual activity: Never    Birth control/protection: Surgical  Lifestyle  . Physical activity:    Days per week: Not on file    Minutes per session: Not on file  . Stress: Not on file  Relationships  . Social connections:    Talks on phone: Not on file    Gets together: Not on file    Attends religious service: Not on file    Active member of club or organization: Not on file    Attends meetings of clubs or organizations: Not on file    Relationship status: Not on file  Other Topics Concern  . Not on file  Social History Narrative   [facility] (11/18/16):   Originally from [place] but grew up in [place]. Cared for her husband until he passed. 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 Does have significant dust exposure. Has 3 dogs currently. Previously had a parakeet as a child. No mold exposure. Enjoys playing with her grandchildren.        Outpatient Encounter Medications  as of 06/15/2018  Medication Sig  . ACCU-CHEK FASTCLIX LANCETS MISC USE TO check blood glucose twice daily  . albuterol (PROVENTIL) (2.5 MG/3ML) 0.083% nebulizer solution Use 1 vial (3 mL) via nebulizer every 6 hours as needed for wheezing or shortness of breath  . aspirin EC 81 MG tablet Take one po qd  . azithromycin (ZITHROMAX) 250 MG tablet Take 500 mg once, then 250 mg for four days  . budesonide (PULMICORT) 0.5 MG/2ML nebulizer solution Take 2 mLs (0.5 mg total) 2 (two) times daily by nebulization.  . cyclobenzaprine (FLEXERIL) 10 MG tablet Take 1 Tablet by mouth 3 times a day as needed for muscle spasms  . doxycycline (VIBRA-TABS) 100 MG tablet Take 1 tablet (100 mg total) by mouth 2 (two) times daily.  Marland Kitchen escitalopram (LEXAPRO) 20 MG tablet Take 1 tablet (20 mg total) by mouth daily.  Marland Kitchen gabapentin (NEURONTIN) 300 MG capsule TAKE 1 CAPSULE BY MOUTH TWICE DAILY  . glimepiride (AMARYL) 2 MG tablet Take 1 tablet (2 mg total) by mouth daily with breakfast.  .  glucose blood (ACCU-CHEK GUIDE) test strip USE TO check blood glucose twice daily  . losartan (COZAAR) 25 MG tablet Take 1 tablet (25 mg total) by mouth daily.  . metFORMIN (GLUCOPHAGE) 1000 MG tablet Take 1 tablet (1,000 mg total) by mouth 2 (two) times daily with a meal.  . methimazole (TAPAZOLE) 5 MG tablet Take 1 tablet (5 mg total) by mouth daily.  . nitroGLYCERIN (NITROSTAT) 0.4 MG SL tablet Place 1 tablet (0.4 mg total) under the tongue every 5 (five) minutes as needed for chest pain.  . pravastatin (PRAVACHOL) 20 MG tablet Take 1 tablet (20 mg total) by mouth daily.  . predniSONE (STERAPRED UNI-PAK 21 TAB) 10 MG (21) TBPK tablet Use as directed  . propranolol (INDERAL) 20 MG tablet Take 1 tablet (20 mg total) by mouth 2 (two) times daily.  . Tiotropium Bromide-Olodaterol (STIOLTO RESPIMAT) 2.5-2.5 MCG/ACT AERS Inhale 2 puffs into the lungs daily.  Marland Kitchen trimethoprim-polymyxin b (POLYTRIM) ophthalmic solution Place 1 drop into the left eye every 4 (four) hours.  . Vitamin D, Ergocalciferol, (DRISDOL) 50000 units CAPS capsule Take 1 capsule (50,000 Units total) by mouth every 7 (seven) days.  . [DISCONTINUED] propranolol (INDERAL) 20 MG tablet Take 1 tablet (20 mg total) by mouth 2 (two) times daily.   No facility-administered encounter medications on file as of 06/15/2018.    ALLERGIES: Allergies  Allergen Reactions  . Dilaudid [Hydromorphone]     Itching and vomitting  . Hydromorphone   . Valproic Acid And Related    VACCINATION STATUS: Immunization History  Administered Date(s) Administered  . Influenza,inj,Quad PF,6+ Mos 04/27/2017  . 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   -She was given prescription for methimazole, did not tolerate even the lowest dose of 5 mg p.o. daily.  She has not taken methimazole for the last several weeks.     Her uptake and scan reveals high normal uptake of 30%, no focal increased  or decreased activities. -She denies weight loss, she is on propanolol 20 mg p.o. twice daily which has helped with symptoms of palpitations, and tremors.    The patient denies family history of thyroid dysfunction- she is adopted. - Patient  is willing to proceed with appropriate therapy for thyrotoxicosis. - She has well-controlled type 2 diabetes with A1c of 6.1% on January 30, 2018.  She remains on metformin and  glimepiride .  - She reports that she has quit smoking after decades of heavy smoking.     Review of Systems   Constitutional: + No major weight change,  + fatigue, + subjective hyperthermia Eyes: no blurry vision, no xerophthalmia ENT: no sore throat, no nodules palpated in throat, no dysphagia/odynophagia, no hoarseness Cardiovascular: no Chest Pain, no Shortness of Breath, - palpitations, no leg swelling Musculoskeletal: no muscle/joint aches Skin: no rashes Neurological: - tremors, no numbness, no tingling, no dizziness Psychiatric: no depression, + anxiety  Objective:    BP (!) 149/85   Pulse 93   Ht 5\' 4"  (1.626 m)   Wt 211 lb 9.6 oz (96 kg)   BMI 36.32 kg/m   Wt Readings from Last 3 Encounters:  06/15/18 211 lb 9.6 oz (96 kg)  03/09/18 204 lb (92.5 kg)  02/26/18 207 lb 12.8 oz (94.3 kg)    Physical Exam Constitutional:  + obese, not in acute distress. Eyes: PERRLA, EOMI, no exophthalmos ENT: moist mucous membranes, thyroid is palpable -mild thyromegaly, no cervical lymphadenopathy Cardiovascular: Tachycardia resolved, no Murmur/Rubs/Gallops  Musculoskeletal: no gross deformities, strength intact in all four extremities Skin: moist, warm, no rashes Neurological: + tremor with outstretched hands.   Lipid Panel     Component Value Date/Time   CHOL 156 01/30/2018 1626   TRIG 220 (H) 01/30/2018 1626   HDL 44 01/30/2018 1626   CHOLHDL 3.5 01/30/2018 1626   LDLCALC 68 01/30/2018 1626   Recent Results (from the past 2160 hour(s))  TSH     Status:  Abnormal   Collection Time: 06/08/18 12:26 PM  Result Value Ref Range   TSH 0.01 (L) mIU/L    Comment:           Reference Range .           > or = 20 Years  0.40-4.50 .                Pregnancy Ranges           First trimester    0.26-2.66           Second trimester   0.55-2.73           Third trimester    0.43-2.91   T4, free     Status: Abnormal   Collection Time: 06/08/18 12:26 PM  Result Value Ref Range   Free T4 2.0 (H) 0.8 - 1.8 ng/dL  T3, free     Status: Abnormal   Collection Time: 06/08/18 12:26 PM  Result Value Ref Range   T3, Free 8.2 (H) 2.3 - 4.2 pg/mL     - Thyroid uptake and scan on 07/19/2016 was nonfocal with 25% uptake and 24 hours.  March 05, 2018 thyroid uptake and scan: FINDINGS: Normal thyroid scan.  No hot or cold nodules are identified.  4 hour I-131 uptake = 17.6% (normal 5-20%) 24 hour I-131 uptake = 30.0% (normal 10-30%)  IMPRESSION: 1. Normal thyroid scan. 2. Top-normal 24 hour iodine 131 uptake at 30%.  Assessment & Plan:   1.  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 daily. -She presents with symptomatic hyperthyroidism with elevated free T4 and free T3.   -She is approached with her next option of antithyroid treatment which is I-131 thyroid ablation.  -Patient is asking for thyroidectomy, however she is not a candidate for thyroidectomy.   -I discussed and ordered I-131  thyroid ablation which will be scheduled to be administered [facility].    -She understands the subsequent need for thyroid hormone replacement for life following thyroid ablation.  -She will return in 9 weeks with repeat thyroid function tests.  She is advised to continue propanolol 20 mg p.o. twice daily for symptom control.  - I advised patient to maintain close follow up with [name], [name], [name] for primary care needs, including diabetes  care.   Follow up plan: Return in about 9 weeks (around 08/17/2018) for Follow up with Pre-visit Labs, Follow up with Labs after I131 Therapy.  Marquis LunchGebre Tahje Borawski, MD Phone: [phone]  Fax: [phone]   06/15/2018, 5:51 PM

## 2018-06-15 NOTE — Progress Notes (Signed)
Sherry Glass, CMA  

## 2018-06-29 ENCOUNTER — Ambulatory Visit (HOSPITAL_COMMUNITY): Payer: Self-pay

## 2018-06-29 ENCOUNTER — Other Ambulatory Visit: Payer: Self-pay | Admitting: Family

## 2018-06-29 ENCOUNTER — Ambulatory Visit (HOSPITAL_COMMUNITY)
Admission: RE | Admit: 2018-06-29 | Discharge: 2018-06-29 | Disposition: A | Discharge status: Home / Self Care | Payer: Medicaid Other | Source: Ambulatory Visit | Attending: "Endocrinology | Admitting: "Endocrinology

## 2018-06-29 ENCOUNTER — Encounter (HOSPITAL_COMMUNITY): Payer: Self-pay

## 2018-06-29 DIAGNOSIS — E1142 Type 2 diabetes mellitus with diabetic polyneuropathy: Secondary | ICD-10-CM

## 2018-06-29 DIAGNOSIS — E0590 Thyrotoxicosis, unspecified without thyrotoxic crisis or storm: Secondary | ICD-10-CM | POA: Diagnosis not present

## 2018-06-29 DIAGNOSIS — E785 Hyperlipidemia, unspecified: Secondary | ICD-10-CM

## 2018-06-29 MED ORDER — SODIUM IODIDE I 131 CAPSULE
20.0000 | Freq: Once | INTRAVENOUS | Status: AC | PRN
  Administered 2018-06-29: 19.48 via ORAL

## 2018-07-02 ENCOUNTER — Ambulatory Visit: Payer: Medicaid Other | Admitting: Family

## 2018-07-02 ENCOUNTER — Encounter: Payer: Self-pay | Admitting: Family

## 2018-07-02 VITALS — BP 128/71 | HR 79 | Temp 97.4°F | Ht 64.0 in | Wt 210.0 lb

## 2018-07-02 DIAGNOSIS — I10 Essential (primary) hypertension: Secondary | ICD-10-CM | POA: Diagnosis not present

## 2018-07-02 DIAGNOSIS — E0590 Thyrotoxicosis, unspecified without thyrotoxic crisis or storm: Secondary | ICD-10-CM | POA: Diagnosis not present

## 2018-07-02 DIAGNOSIS — E1159 Type 2 diabetes mellitus with other circulatory complications: Secondary | ICD-10-CM | POA: Diagnosis not present

## 2018-07-02 DIAGNOSIS — K219 Gastro-esophageal reflux disease without esophagitis: Secondary | ICD-10-CM

## 2018-07-02 DIAGNOSIS — M65311 Trigger thumb, right thumb: Secondary | ICD-10-CM

## 2018-07-02 DIAGNOSIS — F411 Generalized anxiety disorder: Secondary | ICD-10-CM | POA: Diagnosis not present

## 2018-07-02 DIAGNOSIS — J449 Chronic obstructive pulmonary disease, unspecified: Secondary | ICD-10-CM

## 2018-07-02 DIAGNOSIS — E1169 Type 2 diabetes mellitus with other specified complication: Secondary | ICD-10-CM

## 2018-07-02 DIAGNOSIS — E1165 Type 2 diabetes mellitus with hyperglycemia: Secondary | ICD-10-CM

## 2018-07-02 DIAGNOSIS — E785 Hyperlipidemia, unspecified: Secondary | ICD-10-CM | POA: Diagnosis not present

## 2018-07-02 LAB — BAYER DCA HB A1C WAIVED: HB A1C (BAYER DCA - WAIVED): 7.7 % — ABNORMAL HIGH (ref <7.0)

## 2018-07-02 MED ORDER — DICLOFENAC SODIUM 75 MG PO TBEC: 75.0000 mg | DELAYED_RELEASE_TABLET | Freq: Two times a day (BID) | ORAL | 0 refills | Status: DC

## 2018-07-02 NOTE — Progress Notes (Signed)
Subjective:    Patient ID: Sherry Glass, female    DOB: [birth year], 48 y.o.   MRN: [id number]   Chief Complaint  Patient presents with  . Diabetes    three month recheck   Pt presents to the office today for chronic follow up. Pt is followed by Pulmonologist6 monthsfor COPD, Restrictive lung disease, and SOB. Followed by Endocrinologists every 8 weeks for hyperthyroidism. She had thyroid ablation.  Diabetes  She presents for her follow-up diabetic visit. She has type 2 diabetes mellitus. Her disease course has been stable. Hypoglycemia symptoms include nervousness/anxiousness. Associated symptoms include foot paresthesias. Pertinent negatives for diabetes include no blurred vision, no chest pain and no visual change. Symptoms are stable. Diabetic complications include peripheral neuropathy. Pertinent negatives for diabetic complications include no CVA or heart disease. Risk factors for coronary artery disease include dyslipidemia, diabetes mellitus, hypertension and sedentary lifestyle. She is following a generally unhealthy diet. Her overall blood glucose range is 140-180 mg/dl. Eye exam is current.  Hyperlipidemia  This is a chronic problem. The current episode started more than 1 year ago. The problem is controlled. Recent lipid tests were reviewed and are normal. Exacerbating diseases include obesity. Pertinent negatives include no chest pain. Current antihyperlipidemic treatment includes statins. The current treatment provides moderate improvement of lipids. Risk factors for coronary artery disease include diabetes mellitus, dyslipidemia, female sex, hypertension and a sedentary lifestyle.  Gastroesophageal Reflux  She reports no chest pain or no heartburn. This is a chronic problem. The current episode started more than 1 year ago. The problem occurs occasionally. She has tried a PPI for the symptoms. The treatment provided moderate relief.  Anxiety  Presents for follow-up visit. Symptoms  include excessive worry, irritability, nervous/anxious behavior and restlessness. Patient reports no chest pain. The severity of symptoms is moderate.     COPD Uses albuterol as needed. Most days, daily. Stable.   Review of Systems  Constitutional: Positive for irritability.  Eyes: Negative for blurred vision.  Cardiovascular: Negative for chest pain.  Gastrointestinal: Negative for heartburn.  Psychiatric/Behavioral: The patient is nervous/anxious.   All other systems reviewed and are negative.      Objective:   Physical Exam Vitals signs reviewed.  Constitutional:      General: She is not in acute distress.    Appearance: She is well-developed.  HENT:     Head: Normocephalic and atraumatic.     Right Ear: Tympanic membrane normal.     Left Ear: Tympanic membrane normal.  Eyes:     Pupils: Pupils are equal, round, and reactive to light.  Neck:     Musculoskeletal: Normal range of motion and neck supple.     Thyroid: No thyromegaly.  Cardiovascular:     Rate and Rhythm: Normal rate and regular rhythm.     Heart sounds: Normal heart sounds. No murmur.  Pulmonary:     Effort: Pulmonary effort is normal. No respiratory distress.     Breath sounds: Wheezing present.  Abdominal:     General: Bowel sounds are normal. There is no distension.     Palpations: Abdomen is soft.     Tenderness: There is no abdominal tenderness.  Musculoskeletal:        General: No tenderness.     Comments: Right thumb stiffness, unable to extend fulling without manipulation   Skin:    General: Skin is warm and dry.  Neurological:     Mental Status: She is alert and oriented to person, place,  and time.     Cranial Nerves: No cranial nerve deficit.     Deep Tendon Reflexes: Reflexes are normal and symmetric.  Psychiatric:        Behavior: Behavior normal.        Thought Content: Thought content normal.        Judgment: Judgment normal.       BP 128/71   Pulse 79   Temp (!) 97.4 F  (36.3 C) (Oral)   Ht '5\' 4"'  (1.626 m)   Wt 210 lb (95.3 kg)   BMI 36.05 kg/m      Assessment & Plan:  Sherry Glass comes in today with chief complaint of Diabetes (three month recheck)   Diagnosis and orders addressed:  1. Chronic obstructive pulmonary disease, unspecified COPD type ([place]) - CMP14+EGFR - CBC with Differential/Platelet  2. Hypertension associated with diabetes ([place]) - CMP14+EGFR - CBC with Differential/Platelet  3. Gastroesophageal reflux disease, esophagitis presence not specified - CMP14+EGFR - CBC with Differential/Platelet  4. Uncontrolled type 2 diabetes mellitus with hyperglycemia (HCC) - CMP14+EGFR - CBC with Differential/Platelet - Microalbumin / creatinine urine ratio - Bayer DCA Hb A1c Waived  5. Hyperlipidemia associated with type 2 diabetes mellitus (HCC) - CMP14+EGFR - CBC with Differential/Platelet - Lipid panel  6. Hyperthyroidism - CMP14+EGFR - CBC with Differential/Platelet  7. GAD (generalized anxiety disorder) - CMP14+EGFR - CBC with Differential/Platelet  8. Morbid obesity ([place]) - CMP14+EGFR - CBC with Differential/Platelet  9. Trigger finger of right thumb - CMP14+EGFR - CBC with Differential/Platelet - diclofenac (VOLTAREN) 75 MG EC tablet; Take 1 tablet (75 mg total) by mouth 2 (two) times daily.  Dispense: 30 tablet; Refill: 0   Labs pending Health Maintenance reviewed Diet and exercise encouraged  Follow up plan: 3 months and keep appt with specialists   Evelina Dun, [name]

## 2018-07-02 NOTE — Patient Instructions (Signed)
Trigger Finger    Trigger finger (stenosing tenosynovitis) is a condition that causes a finger to get stuck in a bent position. Each finger has a tough, cord-like tissue that connects muscle to bone (tendon), and each tendon is surrounded by a tunnel of tissue (tendon sheath). To move your finger, your tendon needs to slide freely through the sheath. Trigger finger happens when the tendon or the sheath thickens, making it difficult to move your finger.  Trigger finger can affect any finger or a thumb. It may affect more than one finger. Mild cases may clear up with rest and medicine. Severe cases require more treatment.  What are the causes?  Trigger finger is caused by a thickened finger tendon or tendon sheath. The cause of this thickening is not known.  What increases the risk?  The following factors may make you more likely to develop this condition:   Doing activities that require a strong grip.   Having rheumatoid arthritis, gout, or diabetes.   Being 40-60 years old.   Being a woman.  What are the signs or symptoms?  Symptoms of this condition include:   Pain when bending or straightening your finger.   Tenderness or swelling where your finger attaches to the palm of your hand.   A lump in the palm of your hand or on the inside of your finger.   Hearing a popping sound when you try to straighten your finger.   Feeling a popping, catching, or locking sensation when you try to straighten your finger.   Being unable to straighten your finger.  How is this diagnosed?  This condition is diagnosed based on your symptoms and a physical exam.  How is this treated?  This condition may be treated by:   Resting your finger and avoiding activities that make symptoms worse.   Wearing a finger splint to keep your finger in a slightly bent position.   Taking NSAIDs to relieve pain and swelling.   Injecting medicine (steroids) into the tendon sheath to reduce swelling and irritation. Injections may need to be  repeated.   Having surgery to open the tendon sheath. This may be done if other treatments do not work and you cannot straighten your finger. You may need physical therapy after surgery.  Follow these instructions at home:     Use moist heat to help reduce pain and swelling as told by your health care provider.   Rest your finger and avoid activities that make pain worse. Return to normal activities as told by your health care provider.   If you have a splint, wear it as told by your health care provider.   Take over-the-counter and prescription medicines only as told by your health care provider.   Keep all follow-up visits as told by your health care provider. This is important.  Contact a health care provider if:   Your symptoms are not improving with home care.  Summary   Trigger finger (stenosing tenosynovitis) causes your finger to get stuck in a bent position, and it can make it difficult and painful to straighten your finger.   This condition develops when a finger tendon or tendon sheath thickens.   Treatment starts with resting, wearing a splint, and taking NSAIDs.   In severe cases, surgery to open the tendon sheath may be needed.  This information is not intended to replace advice given to you by your health care provider. Make sure you discuss any questions you have with your   health care provider.  Document Released: 02/06/2004 Document Revised: 03/29/2016 Document Reviewed: 03/29/2016  Elsevier Interactive Patient Education  2019 [employer]

## 2018-07-03 LAB — CMP14+EGFR
ALT: 40 IU/L — ABNORMAL HIGH (ref 0–32)
AST: 27 IU/L (ref 0–40)
Albumin/Globulin Ratio: 1.7 (ref 1.2–2.2)
Albumin: 4.5 g/dL (ref 3.8–4.8)
Alkaline Phosphatase: 98 IU/L (ref 39–117)
BUN/Creatinine Ratio: 20 (ref 9–23)
BUN: 8 mg/dL (ref 6–24)
Bilirubin Total: 0.5 mg/dL (ref 0.0–1.2)
CO2: 24 mmol/L (ref 20–29)
Calcium: 10.1 mg/dL (ref 8.7–10.2)
Chloride: 100 mmol/L (ref 96–106)
Creatinine, Ser: 0.4 mg/dL — ABNORMAL LOW (ref 0.57–1.00)
GFR calc Af Amer: 141 mL/min/{1.73_m2} (ref >59)
GFR calc non Af Amer: 123 mL/min/{1.73_m2} (ref >59)
Globulin, Total: 2.7 g/dL (ref 1.5–4.5)
Glucose: 183 mg/dL — ABNORMAL HIGH (ref 65–99)
Potassium: 4.6 mmol/L (ref 3.5–5.2)
Sodium: 139 mmol/L (ref 134–144)
Total Protein: 7.2 g/dL (ref 6.0–8.5)

## 2018-07-03 LAB — CBC WITH DIFFERENTIAL/PLATELET
Basophils Absolute: 0 10*3/uL (ref 0.0–0.2)
Basos: 0 %
EOS (ABSOLUTE): 0.1 10*3/uL (ref 0.0–0.4)
Eos: 2 %
Hematocrit: 44.5 % (ref 34.0–46.6)
Hemoglobin: 14.9 g/dL (ref 11.1–15.9)
Immature Grans (Abs): 0 10*3/uL (ref 0.0–0.1)
Immature Granulocytes: 0 %
Lymphocytes Absolute: 3.6 10*3/uL — ABNORMAL HIGH (ref 0.7–3.1)
Lymphs: 41 %
MCH: 29.4 pg (ref 26.6–33.0)
MCHC: 33.5 g/dL (ref 31.5–35.7)
MCV: 88 fL (ref 79–97)
Monocytes Absolute: 0.6 10*3/uL (ref 0.1–0.9)
Monocytes: 7 %
Neutrophils Absolute: 4.4 10*3/uL (ref 1.4–7.0)
Neutrophils: 50 %
Platelets: 256 10*3/uL (ref 150–450)
RBC: 5.06 x10E6/uL (ref 3.77–5.28)
RDW: 13.2 % (ref 11.7–15.4)
WBC: 8.7 10*3/uL (ref 3.4–10.8)

## 2018-07-03 LAB — MICROALBUMIN / CREATININE URINE RATIO
Creatinine, Urine: 83.3 mg/dL
Microalb/Creat Ratio: 8 mg/g creat (ref 0–29)
Microalbumin, Urine: 6.9 ug/mL

## 2018-07-03 LAB — LIPID PANEL
Chol/HDL Ratio: 3.8 ratio (ref 0.0–4.4)
Cholesterol, Total: 176 mg/dL (ref 100–199)
HDL: 46 mg/dL (ref >39)
LDL Calculated: 77 mg/dL (ref 0–99)
Triglycerides: 267 mg/dL — ABNORMAL HIGH (ref 0–149)
VLDL Cholesterol Cal: 53 mg/dL — ABNORMAL HIGH (ref 5–40)

## 2018-07-19 ENCOUNTER — Other Ambulatory Visit: Payer: Self-pay

## 2018-07-19 ENCOUNTER — Encounter: Payer: Self-pay | Admitting: Family

## 2018-07-19 ENCOUNTER — Telehealth: Payer: Self-pay

## 2018-07-19 ENCOUNTER — Ambulatory Visit: Payer: Medicaid Other | Admitting: Family

## 2018-07-19 ENCOUNTER — Ambulatory Visit (INDEPENDENT_AMBULATORY_CARE_PROVIDER_SITE_OTHER): Payer: Medicaid Other

## 2018-07-19 VITALS — BP 121/71 | HR 88 | Temp 98.7°F | Ht 64.0 in | Wt 213.0 lb

## 2018-07-19 DIAGNOSIS — S8391XA Sprain of unspecified site of right knee, initial encounter: Secondary | ICD-10-CM | POA: Diagnosis not present

## 2018-07-19 DIAGNOSIS — M25561 Pain in right knee: Secondary | ICD-10-CM

## 2018-07-19 MED ORDER — MELOXICAM 15 MG PO TABS: 15.0000 mg | ORAL_TABLET | Freq: Every day | ORAL | 1 refills | Status: DC

## 2018-07-19 MED ORDER — DICLOFENAC SODIUM 75 MG PO TBEC: 75.0000 mg | DELAYED_RELEASE_TABLET | Freq: Two times a day (BID) | ORAL | 0 refills | Status: DC

## 2018-07-19 MED ORDER — METHYLPREDNISOLONE ACETATE 80 MG/ML IJ SUSP
80.0000 mg | Freq: Once | INTRAMUSCULAR | Status: AC
  Administered 2018-07-19: 80 mg via INTRAMUSCULAR

## 2018-07-19 NOTE — Telephone Encounter (Signed)
Medicaid non preferred Diclofenac tablets  Preferred are ibuprofen tab., indomethacin cap., ketorolac tab., meloxicam tab., naproxen EC tab., sulindac tab.

## 2018-07-19 NOTE — Progress Notes (Signed)
Subjective:    Patient ID: Sherry Glass, female    DOB: [birth year], 48 y.o.   MRN: [id number]  Chief Complaint  Patient presents with  . right knee swelling    Knee Pain   The incident occurred more than 1 week ago. There was no injury mechanism. The pain is present in the right knee. The quality of the pain is described as aching. The pain is at a severity of 9/10. The pain is mild. The pain has been constant since onset. Pertinent negatives include no loss of motion, numbness or tingling. She reports no foreign bodies present. The symptoms are aggravated by weight bearing and movement. She has tried acetaminophen, rest and NSAIDs for the symptoms. The treatment provided mild relief.      Review of Systems  Musculoskeletal: Positive for arthralgias.  Neurological: Negative for tingling and numbness.  All other systems reviewed and are negative.      Objective:   Physical Exam Vitals signs reviewed.  Constitutional:      General: She is not in acute distress.    Appearance: She is well-developed.  HENT:     Head: Normocephalic and atraumatic.  Eyes:     Pupils: Pupils are equal, round, and reactive to light.  Neck:     Musculoskeletal: Normal range of motion and neck supple.     Thyroid: No thyromegaly.  Cardiovascular:     Rate and Rhythm: Normal rate and regular rhythm.     Heart sounds: Normal heart sounds. No murmur.  Pulmonary:     Effort: Pulmonary effort is normal. No respiratory distress.     Breath sounds: Normal breath sounds. No wheezing.  Abdominal:     General: Bowel sounds are normal. There is no distension.     Palpations: Abdomen is soft.     Tenderness: There is no abdominal tenderness.  Musculoskeletal:        General: No tenderness.     Comments: Right knee swelling, pain with flexion or extension  Skin:    General: Skin is warm and dry.  Neurological:     Mental Status: She is alert and oriented to person, place, and time.     Cranial Nerves:  No cranial nerve deficit.     Deep Tendon Reflexes: Reflexes are normal and symmetric.  Psychiatric:        Behavior: Behavior normal.        Thought Content: Thought content normal.        Judgment: Judgment normal.       BP 121/71   Pulse 88   Temp 98.7 F (37.1 C) (Oral)   Ht 5\' 4"  (1.626 m)   Wt 213 lb (96.6 kg)   BMI 36.56 kg/m      Assessment & Plan:  [name] comes in today with chief complaint of right knee swelling   Diagnosis and orders addressed:  1. Acute pain of right knee  - methylPREDNISolone acetate (DEPO-MEDROL) injection 80 mg - DG Knee 1-2 Views Right; Future - Ambulatory referral to Physical Therapy  2. Sprain of right knee, unspecified ligament, initial encounter - diclofenac (VOLTAREN) 75 MG EC tablet; Take 1 tablet (75 mg total) by mouth 2 (two) times daily.  Dispense: 60 tablet; Refill: 0 - DG Knee 1-2 Views Right; Future - Ambulatory referral to Physical Therapy   Rest Ice  Start Diclofenac BID for next 7-10 days ROM exercises encouraged Referral to PT RTO if symptoms worsen or do not  improve  Jannifer Rodney, [name]

## 2018-07-19 NOTE — Patient Instructions (Signed)
Knee Sprain, Adult    A knee sprain is a stretch or tear in a knee ligament. Knee ligaments are bands of tissue that connect bones in the knee to each other.  What are the causes?  This condition often results from:  · A fall.  · An injury to the knee.  What are the signs or symptoms?  Symptoms of this condition include:  · Trouble bending the leg.  · Swelling in the knee.  · Bruising around the knee.  · Tenderness or pain in the knee.  · Muscle spasms around the knee.  How is this diagnosed?  This condition may be diagnosed based on:  · A physical exam.  · What happened just before you started to have symptoms.  · Tests, including:  ? An X-ray. This may be done to make sure no bones are broken.  ? An MRI. This may be done to check if the ligament is torn.  ? Stress testing of the knee. This may be done to check ligament damage.  How is this treated?  Treatment for this condition may involve:  · Keeping the knee still (immobilized) with a cast, brace, or splint.  · Applying ice to the knee. This helps with pain and swelling.  · Keeping the knee raised (elevated) above the level of your heart when you are resting. This helps with pain and swelling.  · Taking medicine for pain.  · Exercises to prevent or limit permanent weakness or stiffness in your knee.  · Surgery to reconnect the ligament to the bone or to reconstruct it. This may be needed if the ligament tore all the way.  Follow these instructions at home:  If you have a splint or brace:  · Wear the splint or brace as told by your health care provider. Remove it only as told by your health care provider.  · Loosen the splint or brace if your toes tingle, become numb, or turn cold and blue.  · Keep the splint or brace clean.  · If the splint or brace is not waterproof:  ? Do not let it get wet.  ? Cover it with a watertight covering when you take a bath or a shower.  If you have a cast:  · Do not stick anything inside the cast to scratch your skin. Doing that  increases your risk of infection.  · Check the skin around the cast every day. Tell your health care provider about any concerns.  · You may put lotion on dry skin around the edges of the cast. Do not put lotion on the skin underneath the cast.  · Keep the cast clean.  · If the cast is not waterproof:  ? Do not let it get wet.  ? Cover it with a watertight covering when you take a bath or a shower.  Managing pain, stiffness, and swelling    · If directed, put ice on the injured area.  ? If you have a removable splint or brace, remove it as told by your health care provider.  ? Put ice in a plastic bag.  ? Place a towel between your skin and the bag or between your cast and the bag.  ? Leave the ice on for 20 minutes, 2-3 times a day.  · Gently move your toes often to avoid stiffness and to lessen swelling.  · Elevate the injured area above the level of your heart while you are sitting or lying down.  ·   Take over-the-counter and prescription medicines only as told by your health care provider.  General instructions  · Do exercises as told by your health care provider.  · Keep all follow-up visits as told by your health care provider. This is important.  Contact a health care provider if:  · You have pain that gets worse.  · The cast, brace, or splint does not fit right.  · The cast, brace, or splint gets damaged.  Get help right away if:  · You cannot use your injured joint to support any of your body weight (cannot bear weight).  · You cannot move the injured joint.  · You cannot walk more than a few steps without pain or without your knee buckling.  · You have significant pain, swelling, or numbness below the cast, brace, or splint.  This information is not intended to replace advice given to you by your health care provider. Make sure you discuss any questions you have with your health care provider.  Document Released: 04/18/2005 Document Revised: 01/06/2016 Document Reviewed: 11/06/2015  Elsevier Interactive  Patient Education © 2019 [employer]

## 2018-07-19 NOTE — Telephone Encounter (Signed)
Diclofenac changed to mobic 

## 2018-07-19 NOTE — Telephone Encounter (Signed)
Patient aware and verbalizes understanding. 

## 2018-08-01 DIAGNOSIS — J449 Chronic obstructive pulmonary disease, unspecified: Secondary | ICD-10-CM | POA: Diagnosis not present

## 2018-08-05 ENCOUNTER — Other Ambulatory Visit: Payer: Self-pay | Admitting: Family

## 2018-08-05 DIAGNOSIS — E785 Hyperlipidemia, unspecified: Secondary | ICD-10-CM

## 2018-08-07 ENCOUNTER — Telehealth: Payer: Self-pay

## 2018-08-07 ENCOUNTER — Ambulatory Visit: Payer: Medicaid Other | Admitting: Physical Therapy

## 2018-08-07 NOTE — Telephone Encounter (Signed)
FYI, Diclofenac non-preferred, preferred are Indomethacin capsule, Ketorolac tablet, Meloxicam tablet, Naproxen EC tablet, Naproxen tablet or Sulindac tablet 

## 2018-08-10 DIAGNOSIS — E0590 Thyrotoxicosis, unspecified without thyrotoxic crisis or storm: Secondary | ICD-10-CM | POA: Diagnosis not present

## 2018-08-10 LAB — T3, FREE: T3, Free: 4.3 pg/mL — ABNORMAL HIGH (ref 2.3–4.2)

## 2018-08-10 LAB — T4, FREE: Free T4: 1.8 ng/dL (ref 0.8–1.8)

## 2018-08-10 LAB — TSH: TSH: 0.02 mIU/L — ABNORMAL LOW

## 2018-08-16 ENCOUNTER — Encounter: Payer: Self-pay | Admitting: "Endocrinology

## 2018-08-17 ENCOUNTER — Encounter: Payer: Self-pay | Admitting: Primary Care

## 2018-08-17 ENCOUNTER — Ambulatory Visit: Payer: Self-pay | Admitting: Emergency Medicine

## 2018-08-17 ENCOUNTER — Encounter: Payer: Self-pay | Admitting: "Endocrinology

## 2018-08-17 ENCOUNTER — Ambulatory Visit (INDEPENDENT_AMBULATORY_CARE_PROVIDER_SITE_OTHER): Payer: Medicaid Other | Admitting: "Endocrinology

## 2018-08-17 ENCOUNTER — Ambulatory Visit: Payer: Self-pay | Admitting: Adult Health

## 2018-08-17 ENCOUNTER — Other Ambulatory Visit: Payer: Self-pay

## 2018-08-17 ENCOUNTER — Ambulatory Visit (INDEPENDENT_AMBULATORY_CARE_PROVIDER_SITE_OTHER): Payer: Medicaid Other | Admitting: Primary Care

## 2018-08-17 DIAGNOSIS — J449 Chronic obstructive pulmonary disease, unspecified: Secondary | ICD-10-CM

## 2018-08-17 DIAGNOSIS — E0590 Thyrotoxicosis, unspecified without thyrotoxic crisis or storm: Secondary | ICD-10-CM

## 2018-08-17 MED ORDER — FORMOTEROL FUMARATE 20 MCG/2ML IN NEBU: 20.0000 ug | INHALATION_SOLUTION | Freq: Two times a day (BID) | RESPIRATORY_TRACT | 6 refills | Status: DC

## 2018-08-17 MED ORDER — ALBUTEROL SULFATE (2.5 MG/3ML) 0.083% IN NEBU: INHALATION_SOLUTION | RESPIRATORY_TRACT | 5 refills | Status: DC

## 2018-08-17 NOTE — Progress Notes (Addendum)
Endocrinology Telehealth Visit Follow up Note -During COVID -19 Pandemic  I connected with Sherry Glass on 08/17/18  by telephone and verified that I am speaking with the correct person using two identifiers. Sherry Glass, July 05, 1970. she has verbally consented to this visit. All issues noted in this document were discussed and addressed. The format was not optimal for physical exam.       Subjective:    Patient ID: Sherry Glass, female    DOB: [birth year], PCP Sharion Balloon, [name].   Past Medical History:  Diagnosis Date  . Allergic rhinitis   . Anxiety and depression   . Cataract   . Chest pain syndrome   . COPD (chronic obstructive pulmonary disease) ([place])   . Depression   . Diabetes mellitus without complication ([place])   . History of kidney stones   . HTN (hypertension)   . Hyperglycemia   . Hypertension   . Obesity   . Restless leg syndrome   . Tobacco abuse    Past Surgical History:  Procedure Laterality Date  . ABDOMINAL HYSTERECTOMY    . CARDIAC CATHETERIZATION  2014  . CATARACT EXTRACTION W/PHACO Left 06/23/2016   Procedure: CATARACT EXTRACTION PHACO AND INTRAOCULAR LENS PLACEMENT (IOC);  Surgeon: Tonny Branch, MD;  Location: [facility];  Service: Ophthalmology;  Laterality: Left;  left - pt knows to arrive at 10:50 cde 9.11  . CESAREAN SECTION    . CHOLECYSTECTOMY    . EXCISION MASS NECK     cyst  . FOOT MASS EXCISION    . hysteectomy --- unknown    . left foot surgery     removal of cyst  . NECK SURGERY    . TONSILLECTOMY     Social History   Socioeconomic History  . Marital status: Widowed    Spouse name: Not on file  . Number of children: Not on file  . Years of education: Not on file  . Highest education level: Not on file  Occupational History  . Not on file  Social Needs  . Financial resource strain: Not on file  . Food insecurity:    Worry: Not on file    Inability: Not on file  . Transportation needs:     Medical: Not on file    Non-medical: Not on file  Tobacco Use  . Smoking status: Former Smoker    Packs/day: 1.00    Years: 25.00    Pack years: 25.00    Types: Cigarettes    Start date: 11/13/1988    Last attempt to quit: 10/28/2016    Years since quitting: 1.8  . Smokeless tobacco: Never Used  Substance and Sexual Activity  . Alcohol use: Yes    Comment: For special occasions  . Drug use: No  . Sexual activity: Never    Birth control/protection: Surgical  Lifestyle  . Physical activity:    Days per week: Not on file    Minutes per session: Not on file  . Stress: Not on file  Relationships  . Social connections:    Talks on phone: Not on file    Gets together: Not on file    Attends religious service: Not on file    Active member of club or organization: Not on file  Attends meetings of clubs or organizations: Not on file    Relationship status: Not on file  Other Topics Concern  . Not on file  Social History Narrative   [facility] (11/18/16):   Originally from [place] but grew up in [place]. Cared for her husband until he passed. 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 Does have significant dust exposure. Has 3 dogs currently. Previously had a parakeet as a child. No mold exposure. Enjoys playing with her grandchildren.        Outpatient Encounter Medications as of 08/17/2018  Medication Sig  . ACCU-CHEK FASTCLIX LANCETS MISC USE TO check blood glucose twice daily  . albuterol (PROVENTIL) (2.5 MG/3ML) 0.083% nebulizer solution Use 1 vial (3 mL) via nebulizer every 6 hours as needed for wheezing or shortness of breath  . aspirin EC 81 MG tablet Take one po qd  . budesonide (PULMICORT) 0.5 MG/2ML nebulizer solution Take 2 mLs (0.5 mg total) 2 (two) times daily by nebulization.  . cyclobenzaprine (FLEXERIL) 10 MG tablet Take 1 Tablet by mouth 3 times a day as needed for muscle spasms  . escitalopram (LEXAPRO) 20 MG tablet Take 1  tablet (20 mg total) by mouth daily.  Marland Kitchen gabapentin (NEURONTIN) 300 MG capsule Take 1 capsule by mouth twice daily  . glimepiride (AMARYL) 2 MG tablet Take 1 tablet by mouth once daily with breakfast  . glucose blood (ACCU-CHEK GUIDE) test strip USE TO check blood glucose twice daily  . losartan (COZAAR) 25 MG tablet Take 1 tablet (25 mg total) by mouth daily.  . meloxicam (MOBIC) 15 MG tablet Take 1 tablet (15 mg total) by mouth daily.  . metFORMIN (GLUCOPHAGE) 1000 MG tablet Take 1 tablet (1,000 mg total) by mouth 2 (two) times daily with a meal.  . nitroGLYCERIN (NITROSTAT) 0.4 MG SL tablet Place 1 tablet (0.4 mg total) under the tongue every 5 (five) minutes as needed for chest pain.  . pravastatin (PRAVACHOL) 20 MG tablet Take 1 tablet by mouth once daily   No facility-administered encounter medications on file as of 08/17/2018.    ALLERGIES: Allergies  Allergen Reactions  . Dilaudid [Hydromorphone]     Itching and vomitting  . Hydromorphone   . Valproic Acid And Related    VACCINATION STATUS: Immunization History  Administered Date(s) Administered  . Influenza,inj,Quad PF,6+ Mos 04/27/2017  . Pneumococcal Polysaccharide-23 05/02/2005    HPI This is a telephone visit. 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   -She is status post radioactive iodine thyroid ablation on June 29, 2018.  She reports improvement in her prior symptoms including palpitations, tremors, heat intolerance.   -She denies weight loss , weight gain. The patient denies family history of thyroid dysfunction- she is adopted. - Patient  is willing to proceed with appropriate therapy for thyrotoxicosis. - She has well-controlled type 2 diabetes with A1c of 6.1% on January 30, 2018.  She remains on metformin and glimepiride .  - She reports that she has quit smoking after decades of heavy smoking.     Review of Systems     Objective:    There were no vitals taken for this visit.  Wt  Readings from Last 3 Encounters:  07/19/18 213 lb (96.6 kg)  07/02/18 210 lb (95.3 kg)  06/15/18 211 lb 9.6 oz (96 kg)    Physical Exam   Lipid Panel     Component Value Date/Time   CHOL 176 07/02/2018 1328  TRIG 267 (H) 07/02/2018 1328   HDL 46 07/02/2018 1328   CHOLHDL 3.8 07/02/2018 1328   LDLCALC 77 07/02/2018 1328   Recent Results (from the past 2160 hour(s))  [facility] DIABETES EYE EXAM     Status: None   Collection Time: 05/31/18 12:00 AM  Result Value Ref Range   HM Diabetic Eye Exam No Retinopathy No Retinopathy    Comment: Truc Tran,OD  TSH     Status: Abnormal   Collection Time: 06/08/18 12:26 PM  Result Value Ref Range   TSH 0.01 (L) mIU/L    Comment:           Reference Range .           > or = 20 Years  0.40-4.50 .                Pregnancy Ranges           First trimester    0.26-2.66           Second trimester   0.55-2.73           Third trimester    0.43-2.91   T4, free     Status: Abnormal   Collection Time: 06/08/18 12:26 PM  Result Value Ref Range   Free T4 2.0 (H) 0.8 - 1.8 ng/dL  T3, free     Status: Abnormal   Collection Time: 06/08/18 12:26 PM  Result Value Ref Range   T3, Free 8.2 (H) 2.3 - 4.2 pg/mL  Bayer DCA Hb A1c Waived     Status: Abnormal   Collection Time: 07/02/18 12:21 PM  Result Value Ref Range   HB A1C (BAYER DCA - WAIVED) 7.7 (H) <7.0 %    Comment:                                       Diabetic Adult            <7.0                                       Healthy Adult        4.3 - 5.7                                                           (DCCT/NGSP) American Diabetes Association's Summary of Glycemic Recommendations for Adults with Diabetes: Hemoglobin A1c <7.0%. More stringent glycemic goals (A1c <6.0%) may further reduce complications at the cost of increased risk of hypoglycemia.   Microalbumin / creatinine urine ratio     Status: None   Collection Time: 07/02/18  1:27 PM  Result Value Ref Range   Creatinine, Urine 83.3  Not Estab. mg/dL   Microalbumin, Urine 6.9 Not Estab. ug/mL   Microalb/Creat Ratio 8 0 - 29 mg/g creat    Comment:                        Normal:                0 -  29  Moderately increased: 30 - 300                        Severely increased:       >300               **Please note reference interval change**   CMP14+EGFR     Status: Abnormal   Collection Time: 07/02/18  1:28 PM  Result Value Ref Range   Glucose 183 (H) 65 - 99 mg/dL   BUN 8 6 - 24 mg/dL   Creatinine, Ser 0.40 (L) 0.57 - 1.00 mg/dL   GFR calc non Af Amer 123 >59 mL/min/1.73   GFR calc Af Amer 141 >59 mL/min/1.73   BUN/Creatinine Ratio 20 9 - 23   Sodium 139 134 - 144 mmol/L   Potassium 4.6 3.5 - 5.2 mmol/L   Chloride 100 96 - 106 mmol/L   CO2 24 20 - 29 mmol/L   Calcium 10.1 8.7 - 10.2 mg/dL   Total Protein 7.2 6.0 - 8.5 g/dL   Albumin 4.5 3.8 - 4.8 g/dL   Globulin, Total 2.7 1.5 - 4.5 g/dL   Albumin/Globulin Ratio 1.7 1.2 - 2.2   Bilirubin Total 0.5 0.0 - 1.2 mg/dL   Alkaline Phosphatase 98 39 - 117 IU/L   AST 27 0 - 40 IU/L   ALT 40 (H) 0 - 32 IU/L  CBC with Differential/Platelet     Status: Abnormal   Collection Time: 07/02/18  1:28 PM  Result Value Ref Range   WBC 8.7 3.4 - 10.8 x10E3/uL   RBC 5.06 3.77 - 5.28 x10E6/uL   Hemoglobin 14.9 11.1 - 15.9 g/dL   Hematocrit 44.5 34.0 - 46.6 %   MCV 88 79 - 97 fL   MCH 29.4 26.6 - 33.0 pg   MCHC 33.5 31.5 - 35.7 g/dL   RDW 13.2 11.7 - 15.4 %   Platelets 256 150 - 450 x10E3/uL   Neutrophils 50 Not Estab. %   Lymphs 41 Not Estab. %   Monocytes 7 Not Estab. %   Eos 2 Not Estab. %   Basos 0 Not Estab. %   Neutrophils Absolute 4.4 1.4 - 7.0 x10E3/uL   Lymphocytes Absolute 3.6 (H) 0.7 - 3.1 x10E3/uL   Monocytes Absolute 0.6 0.1 - 0.9 x10E3/uL   EOS (ABSOLUTE) 0.1 0.0 - 0.4 x10E3/uL   Basophils Absolute 0.0 0.0 - 0.2 x10E3/uL   Immature Granulocytes 0 Not Estab. %   Immature Grans (Abs) 0.0 0.0 - 0.1 x10E3/uL  Lipid panel     Status:  Abnormal   Collection Time: 07/02/18  1:28 PM  Result Value Ref Range   Cholesterol, Total 176 100 - 199 mg/dL   Triglycerides 267 (H) 0 - 149 mg/dL   HDL 46 >39 mg/dL   VLDL Cholesterol Cal 53 (H) 5 - 40 mg/dL   LDL Calculated 77 0 - 99 mg/dL   Chol/HDL Ratio 3.8 0.0 - 4.4 ratio    Comment:                                   T. Chol/HDL Ratio                                             Men  Women  1/2 Avg.Risk  3.4    3.3                                   Avg.Risk  5.0    4.4                                2X Avg.Risk  9.6    7.1                                3X Avg.Risk 23.4   11.0   TSH     Status: Abnormal   Collection Time: 08/10/18  1:16 PM  Result Value Ref Range   TSH 0.02 (L) mIU/L    Comment:           Reference Range .           > or = 20 Years  0.40-4.50 .                Pregnancy Ranges           First trimester    0.26-2.66           Second trimester   0.55-2.73           Third trimester    0.43-2.91   T4, free     Status: None   Collection Time: 08/10/18  1:16 PM  Result Value Ref Range   Free T4 1.8 0.8 - 1.8 ng/dL  T3, free     Status: Abnormal   Collection Time: 08/10/18  1:16 PM  Result Value Ref Range   T3, Free 4.3 (H) 2.3 - 4.2 pg/mL     - Thyroid uptake and scan on 07/19/2016 was nonfocal with 25% uptake and 24 hours.  March 05, 2018 thyroid uptake and scan: FINDINGS: Normal thyroid scan.  No hot or cold nodules are identified.  4 hour I-131 uptake = 17.6% (normal 5-20%) 24 hour I-131 uptake = 30.0% (normal 10-30%)  IMPRESSION: 1. Normal thyroid scan. 2. Top-normal 24 hour iodine 131 uptake at 30%.  I-131 thyroid ablation on June 29, 2018.  Assessment & Plan:   1.  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  -Her previsit thyroid function tests indicate treatment effect, however she is not  hypothyroid yet.  She will not be initiated on thyroid hormone replacement.  -She is scheduled to have repeat thyroid function tests 6 weeks from a with office visit in 7 weeks.   - I advised patient to maintain close follow up with Sharion Balloon, [name] for primary care needs, including diabetes care.   Follow up plan: Return in about 7 weeks (around 10/05/2018) for Follow up with Pre-visit Labs.  Glade Lloyd, MD Phone: [phone]  Fax: [phone]   08/17/2018, 10:34 AM

## 2018-08-17 NOTE — Patient Instructions (Addendum)
  Adding perforomist nebulizer twice a day scheduled  (this is the same medication in Bevespi but in a nebulized form, it is a long acting bronchodilator)   Continue Pulmicort twice daily scheduled   Take Albuterol nebulizer every 4-6 hours for breakthrough shortness of breath/wheezing   Rx:  - Sending in RX for Perforomist (formoterol)  Follow up: - E-visit with NP in 1 months  - 4 months with Dr. Delton Coombes  (August) - Needs CT chest for interval stability pulmonary nodule in July 2020

## 2018-08-17 NOTE — Progress Notes (Signed)
Virtual Visit via Telephone Note  I connected with Sherry Glass on 08/17/18 at 11:00 AM EDT by telephone and verified that I am speaking with the correct person using two identifiers.   I discussed the limitations, risks, security and privacy concerns of performing an evaluation and management service by telephone and the availability of in person appointments. I also discussed with the patient that there may be a patient responsible charge related to this service. The patient expressed understanding and agreed to proceed.   History of Present Illness: 48 year old female, former smoker(25 pack year hx). PMH significant for COPD, chronic seasonal allergic rhinitis, restrictive lung disease, nocturnal hypoxia, pulmonary nodular disease, diabetes, obesity. Patient of Dr. Delton Coombes, last seen on 02/02/18. Most recent CT chest 11/27/17 showed stable right upper lobe ground-glass nodule. Needs final follow up for interval stability in July 2020. Cough was worse on Stiolto, changed back to [employer].    08/17/2018 Called today for 6 month follow-up. States that Sherry Glass was not covered. Currently taking Pulmicort twice daily. Still has issues catching her breath during the day. Uses albuterol nebulizer three a times a day.   Observations/Objective:  - No sob, wheezing or cough noted during phone conversation  Assessment and Plan:  COPD - Former smoker  - Spirometry in 2019 suggested restriction without obstruction - Bevespi not covered - Currently on Pulmicort twice daily - Add perforomist twice daily (if not covered try Brovana)  Lung nodule <6cm on CT - Needs CT chest for interval stability in July 2020   Follow Up Instructions:  1 month fu E-Vist with NP 4 month fu with Dr. Delton Coombes   I discussed the assessment and treatment plan with the patient. The patient was provided an opportunity to ask questions and all were answered. The patient agreed with the plan and demonstrated an understanding of the  instructions.   The patient was advised to call back or seek an in-person evaluation if the symptoms worsen or if the condition fails to improve as anticipated.  I provided 25 minutes of non-face-to-face time during this encounter.   Glenford Bayley, NP

## 2018-08-24 ENCOUNTER — Ambulatory Visit: Payer: Medicaid Other | Attending: Family | Admitting: Physical Therapy

## 2018-08-24 ENCOUNTER — Other Ambulatory Visit: Payer: Self-pay

## 2018-08-24 ENCOUNTER — Encounter: Payer: Self-pay | Admitting: Physical Therapy

## 2018-08-24 DIAGNOSIS — M25561 Pain in right knee: Secondary | ICD-10-CM | POA: Diagnosis not present

## 2018-08-24 DIAGNOSIS — R262 Difficulty in walking, not elsewhere classified: Secondary | ICD-10-CM | POA: Diagnosis not present

## 2018-08-24 NOTE — Therapy (Signed)
[facility] [address] [place], [place], [address] Phone: [phone]   Fax:  [phone]  Physical Therapy Evaluation  Patient Details  Name: Sherry Glass MRN: [id number] Date of Birth: January 13, 1971 Referring Provider (PT): Jannifer Rodney, [name]   Encounter Date: 08/24/2018  PT End of Session - 08/24/18 1041    Visit Number  1    Number of Visits  12    Date for PT Re-Evaluation  10/19/18    Authorization Type  Medicaid    PT Start Time  0945    PT Stop Time  1020    PT Time Calculation (min)  35 min    Activity Tolerance  Patient tolerated treatment well    Behavior During Therapy  [facility] for tasks assessed/performed       Past Medical History:  Diagnosis Date  . Allergic rhinitis   . Anxiety and depression   . Cataract   . Chest pain syndrome   . COPD (chronic obstructive pulmonary disease) (HCC)   . Depression   . Diabetes mellitus without complication (HCC)   . History of kidney stones   . HTN (hypertension)   . Hyperglycemia   . Hypertension   . Obesity   . Restless leg syndrome   . Tobacco abuse     Past Surgical History:  Procedure Laterality Date  . ABDOMINAL HYSTERECTOMY    . CARDIAC CATHETERIZATION  2014  . CATARACT EXTRACTION W/PHACO Left 06/23/2016   Procedure: CATARACT EXTRACTION PHACO AND INTRAOCULAR LENS PLACEMENT (IOC);  Surgeon: Gemma Payor, MD;  Location: [facility];  Service: Ophthalmology;  Laterality: Left;  left - pt knows to arrive at 10:50 cde 9.11  . CESAREAN SECTION    . CHOLECYSTECTOMY    . EXCISION MASS NECK     cyst  . FOOT MASS EXCISION    . hysteectomy --- unknown    . left foot surgery     removal of cyst  . NECK SURGERY    . TONSILLECTOMY      There were no vitals filed for this visit.   Subjective Assessment - 08/24/18 1117    Subjective  COVID-19 screening performed prior to patient entering the building. Patient arrives to physical therapy with reports of right knee pain that began  2 months ago. Patient denied any injury to the knee. Patient reports difficulties with walking and performing ADLs that involve bending or squatting. Patient states her knee swells especially at night and reports her pain feels sharp, stuck, and like its "hitting a bone." Patient reports pain at worst is 8/10 and pain at best is a 2/10 with heat and Tylenol. Patient's goals are to decrease pain, improve movement, improve strength, and improve ability to perform home activities.     Pertinent History  HTN, COPD, Asthma, DM    Limitations  Sitting;House hold activities;Walking;Standing    How long can you sit comfortably?  30 minutes    How long can you stand comfortably?  10 minutes but reports legs go numb    How long can you walk comfortably?  10 minutes    Diagnostic tests  X-ray:    Patient Stated Goals  decrease pain, improve ability to perform home activities    Pain Score  5     Pain Location  Knee    Pain Orientation  Right    Pain Descriptors / Indicators  Aching;Sharp    Pain Type  Acute pain    Pain Onset  More  than a month ago    Pain Frequency  Constant    Aggravating Factors   bending and squatting    Pain Relieving Factors  ice and medication    Effect of Pain on Daily Activities  walking and home activities         [facility] Assessment - 08/24/18 0001      Assessment   Medical Diagnosis  acute right knee pain, sprain of right knee unspecified ligament    Referring Provider (PT)  Jannifer Rodneyhristy Hawks, [name]    Onset Date/Surgical Date  --   2 months ago    Next MD Visit  June 2020    Prior Therapy  no      Precautions   Precautions  None      Restrictions   Weight Bearing Restrictions  No      Balance Screen   Has the patient fallen in the past 6 months  Yes    How many times?  1    Has the patient had a decrease in activity level because of a fear of falling?   No    Is the patient reluctant to leave their home because of a fear of falling?   No      Home Environment    Living Environment  Private residence      ROM / Strength   AROM / PROM / Strength  AROM;PROM;Strength      AROM   AROM Assessment Site  Knee    Right/Left Knee  Right    Right Knee Extension  4    Right Knee Flexion  90      PROM   PROM Assessment Site  Knee    Right/Left Knee  Right    Right Knee Extension  3    Right Knee Flexion  120      Strength   Strength Assessment Site  Knee;Hip    Right/Left Hip  Right    Right Hip Flexion  3+/5    Right Hip Extension  3+/5    Right Hip ABduction  3+/5    Right/Left Knee  Right    Right Knee Flexion  3+/5    Right Knee Extension  3+/5      Palpation   Patella mobility  WNL but increased pain with Superior/inferior mobx    Palpation comment  tender to palpation to superior aspect of right knee.       Special Tests    Special Tests  Knee Special Tests;Laxity/Instability Tests    Knee Special tests   Patellofemoral Grind Test (Clarke's Sign)      Patellofemoral Grind test (Clark's Sign)   Findings  Postive    Side   Right      Ambulation/Gait   Gait Pattern  Decreased hip/knee flexion - right;Decreased weight shift to right;Decreased step length - right;Decreased stance time - right;Antalgic                Objective measurements completed on examination: See above findings.              PT Education - 08/24/18 1039    Education Details  heel slides, quad sets, SLR, clam shells, hip adduction, hamstring stretching    Person(s) Educated  Patient    Methods  Explanation;Demonstration;Handout    Comprehension  Verbalized understanding;Returned demonstration       PT Short Term Goals - 08/24/18 1047      PT SHORT TERM GOAL #1  Title  Patient will be independent with HEP    Baseline  no knowledge of exercises    Time  3    Period  Weeks    Status  New      PT SHORT TERM GOAL #2   Title  Patient will increase right knee flexion AROM to 95 degrees or greater to improve functional tasks     Baseline  90 degrees right knee flexion AROM    Time  3    Period  Weeks    Status  New        PT Long Term Goals - 08/24/18 1048      PT LONG TERM GOAL #1   Title  Patient will demonstrate 120+ degrees of right knee flexion AROM to improve functional tasks.     Baseline  90 degrees of right knee flexion AROM    Time  6    Period  Weeks    Status  New      PT LONG TERM GOAL #2   Title  Patient will demonstrate 3 degrees or less of right knee extension AROM to improve gait mechanics.    Baseline  5 degrees of right knee extension AROM    Time  6    Period  Weeks    Status  New      PT LONG TERM GOAL #3   Title  Patient will report ability to perform ADLs with right knee pain less than 4/10.    Baseline  8/10 right knee pain    Time  6    Period  Weeks    Status  New             Plan - 08/24/18 1044    Clinical Impression Statement  Patient is a 48 year old female who presents to physical therapy with right knee pain, decreased right knee and hip MMT, and decreased right AROM. Patient (+) for right Clark's Test, (-) Anterior and Posterior Drawer Test. Patient ambulates with an antalgic gait pattern, decreased right weightbearing and decreased right knee flexion during swing. Patient provided with HEP as well as educated importance of finding balance between home activities and allowing for rests. Patient reported understanding. Patient would benefit from skilled physical therapy to address deficits and address patient's goals.     Personal Factors and Comorbidities  Comorbidity 3+    Comorbidities  HTN, asthma, COPD, DM    Examination-Activity Limitations  Bend;Squat    Examination-Participation Restrictions  Laundry;Cleaning;Meal Prep    Stability/Clinical Decision Making  Stable/Uncomplicated    Clinical Decision Making  Low    Rehab Potential  Good    PT Frequency  2x / week    PT Duration  6 weeks    PT Treatment/Interventions  ADLs/Self Care Home  Management;Electrical Stimulation;Moist Heat;Cryotherapy;Ultrasound;Therapeutic activities;Therapeutic exercise;Neuromuscular re-education;Balance training;Manual techniques;Passive range of motion;Vasopneumatic Device;Taping;Patient/family education    PT Next Visit Plan  Nustep or bike, hip and knee strengthening modalities PRN for modalities    PT Home Exercise Plan  see patient instructions    Consulted and Agree with Plan of Care  Patient       Patient will benefit from skilled therapeutic intervention in order to improve the following deficits and impairments:  Pain, Decreased range of motion, Decreased activity tolerance, Decreased endurance, Difficulty walking  Visit Diagnosis: Acute pain of right knee - Plan: PT plan of care cert/re-cert  Difficulty in walking, not elsewhere classified - Plan: PT plan of care  cert/re-cert     Problem List Patient Active Problem List   Diagnosis Date Noted  . Nocturnal hypoxemia 05/29/2017  . GAD (generalized anxiety disorder) 02/13/2017  . Restrictive lung disease 01/27/2017  . Lung nodule < 6cm on CT 01/27/2017  . GERD (gastroesophageal reflux disease) 11/18/2016  . Snoring 11/18/2016  . Witnessed episode of apnea 11/18/2016  . Chronic seasonal allergic rhinitis 11/18/2016  . Restless leg syndrome 11/18/2016  . Fibromyalgia 11/18/2016  . Hyperlipidemia associated with type 2 diabetes mellitus (HCC) 06/09/2016  . Vitamin D deficiency 05/16/2016  . Hyperthyroidism 05/16/2016  . Morbid obesity (HCC) 05/13/2016  . Type 2 diabetes mellitus, uncontrolled (HCC) 05/13/2016  . COPD (chronic obstructive pulmonary disease) (HCC) 05/13/2016  . Chest pain syndrome   . Hypertension associated with diabetes (HCC)   . Stopped smoking with greater than 25 pack year history     Guss Bunde, PT, DPT 08/24/2018, 12:09 PM  [facility] [address] [place], [place], [address] Phone: [phone]    Fax:  [phone]  Name: Sherry Glass MRN: [id number] Date of Birth: 01-23-1971

## 2018-08-31 DIAGNOSIS — J449 Chronic obstructive pulmonary disease, unspecified: Secondary | ICD-10-CM | POA: Diagnosis not present

## 2018-09-04 ENCOUNTER — Telehealth: Payer: Self-pay

## 2018-09-04 ENCOUNTER — Other Ambulatory Visit: Payer: Self-pay | Admitting: "Endocrinology

## 2018-09-04 MED ORDER — PROPRANOLOL HCL 20 MG PO TABS: 20.0000 mg | ORAL_TABLET | Freq: Two times a day (BID) | ORAL | 1 refills | Status: DC

## 2018-09-04 NOTE — Telephone Encounter (Signed)
She can do her TSH/Free T4 anytime , will send a rx for propranolol to her pharmacy for the shakes.

## 2018-09-04 NOTE — Telephone Encounter (Signed)
   Message from Patient:   I have a question. I am gaining weight I have gained 17 pounds. all I want to do is sleep because I have no energy. My shaking is worse. What am I supposed to do?

## 2018-09-05 NOTE — Telephone Encounter (Signed)
Pt.notified

## 2018-09-06 DIAGNOSIS — E0590 Thyrotoxicosis, unspecified without thyrotoxic crisis or storm: Secondary | ICD-10-CM | POA: Diagnosis not present

## 2018-09-06 LAB — TSH: TSH: 0.04 mIU/L — ABNORMAL LOW

## 2018-09-06 LAB — T4, FREE: Free T4: 0.7 ng/dL — ABNORMAL LOW (ref 0.8–1.8)

## 2018-09-07 ENCOUNTER — Encounter: Payer: Self-pay | Admitting: "Endocrinology

## 2018-09-07 ENCOUNTER — Ambulatory Visit (INDEPENDENT_AMBULATORY_CARE_PROVIDER_SITE_OTHER): Payer: Medicaid Other | Admitting: "Endocrinology

## 2018-09-07 ENCOUNTER — Other Ambulatory Visit: Payer: Self-pay

## 2018-09-07 DIAGNOSIS — E1165 Type 2 diabetes mellitus with hyperglycemia: Secondary | ICD-10-CM | POA: Diagnosis not present

## 2018-09-07 DIAGNOSIS — E890 Postprocedural hypothyroidism: Secondary | ICD-10-CM | POA: Insufficient documentation

## 2018-09-07 MED ORDER — LEVOTHYROXINE SODIUM 50 MCG PO TABS: 50.0000 ug | ORAL_TABLET | Freq: Every day | ORAL | 2 refills | Status: DC

## 2018-09-07 NOTE — Progress Notes (Signed)
09/07/2018                                 Endocrinology Telehealth Visit Follow up Note -During COVID -19 Pandemic  I connected with Sherry Glass on 09/07/2018   by telephone and verified that I am speaking with the correct person using two identifiers. Sherry Glass, [birth year]. she has verbally consented to this visit. All issues noted in this document were discussed and addressed. The format was not optimal for physical exam.    Subjective:    Patient ID: Sherry Glass, female    DOB: 1971/04/12, PCP Sherry Balloon, [name].   Past Medical History:  Diagnosis Date  . Allergic rhinitis   . Anxiety and depression   . Cataract   . Chest pain syndrome   . COPD (chronic obstructive pulmonary disease) ([place])   . Depression   . Diabetes mellitus without complication ([place])   . History of kidney stones   . HTN (hypertension)   . Hyperglycemia   . Hypertension   . Obesity   . Restless leg syndrome   . Tobacco abuse    Past Surgical History:  Procedure Laterality Date  . ABDOMINAL HYSTERECTOMY    . CARDIAC CATHETERIZATION  2014  . CATARACT EXTRACTION W/PHACO Left 06/23/2016   Procedure: CATARACT EXTRACTION PHACO AND INTRAOCULAR LENS PLACEMENT (IOC);  Surgeon: Tonny Branch, MD;  Location: [facility];  Service: Ophthalmology;  Laterality: Left;  left - pt knows to arrive at 10:50 cde 9.11  . CESAREAN SECTION    . CHOLECYSTECTOMY    . EXCISION MASS NECK     cyst  . FOOT MASS EXCISION    . hysteectomy --- unknown    . left foot surgery     removal of cyst  . NECK SURGERY    . TONSILLECTOMY     Social History   Socioeconomic History  . Marital status: Widowed    Spouse name: Not on file  . Number of children: Not on file  . Years of education: Not on file  . Highest education level: Not on file  Occupational History  . Not on file  Social Needs  . Financial resource strain: Not on file  . Food insecurity:    Worry: Not on file    Inability: Not on file  . Transportation needs:    Medical: Not on file    Non-medical: Not on file  Tobacco Use  . Smoking status: Former Smoker    Packs/day: 1.00    Years: 25.00    Pack years: 25.00    Types: Cigarettes    Start date: 11/13/1988    Last attempt to quit: 10/28/2016    Years since quitting: 1.8  . Smokeless tobacco: Never Used  Substance and Sexual Activity  . Alcohol use: Yes    Comment: For special occasions  . Drug use: No  . Sexual activity: Never    Birth control/protection: Surgical  Lifestyle  . Physical activity:    Days per week: Not on file    Minutes per session: Not on file  . Stress: Not on file  Relationships  . Social connections:    Talks on phone: Not on file    Gets together: Not on file    Attends religious service: Not on file    Active member of club or organization: Not on file    Attends meetings of  clubs or organizations: Not on file    Relationship status: Not on file  Other Topics Concern  . Not on file  Social History Narrative   [facility] (11/18/16):   Originally from [place] but grew up in [place]. Cared for her husband until he passed. 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 Does have significant dust exposure. Has 3 dogs currently. Previously had a parakeet as a child. No mold exposure. Enjoys playing with her grandchildren.        Outpatient Encounter Medications as of 09/07/2018  Medication Sig  . ACCU-CHEK FASTCLIX LANCETS MISC USE TO check blood glucose twice daily  . acetaminophen (TYLENOL) 500 MG tablet Take 500 mg by mouth every 6 (six) hours as needed.  Marland Kitchen albuterol (PROVENTIL) (2.5 MG/3ML) 0.083% nebulizer solution Use 1 vial (3 mL) via nebulizer every 6 hours as needed for wheezing or shortness of breath  . aspirin EC 81 MG tablet Take one po qd  . budesonide (PULMICORT) 0.5 MG/2ML nebulizer solution Take 2 mLs (0.5 mg total) 2 (two) times daily by nebulization.  . cyclobenzaprine (FLEXERIL) 10 MG tablet Take 1 Tablet by  mouth 3 times a day as needed for muscle spasms  . escitalopram (LEXAPRO) 20 MG tablet Take 1 tablet (20 mg total) by mouth daily.  . formoterol (PERFOROMIST) 20 MCG/2ML nebulizer solution Take 2 mLs (20 mcg total) by nebulization 2 (two) times daily.  Marland Kitchen gabapentin (NEURONTIN) 300 MG capsule Take 1 capsule by mouth twice daily  . glimepiride (AMARYL) 2 MG tablet Take 1 tablet by mouth once daily with breakfast  . glucose blood (ACCU-CHEK GUIDE) test strip USE TO check blood glucose twice daily  . levothyroxine (SYNTHROID) 50 MCG tablet Take 1 tablet (50 mcg total) by mouth daily before breakfast.  . losartan (COZAAR) 25 MG tablet Take 1 tablet (25 mg total) by mouth daily.  . meloxicam (MOBIC) 15 MG tablet Take 1 tablet (15 mg total) by mouth daily.  . metFORMIN (GLUCOPHAGE) 1000 MG tablet Take 1 tablet (1,000 mg total) by mouth 2 (two) times daily with a meal.  . nitroGLYCERIN (NITROSTAT) 0.4 MG SL tablet Place 1 tablet (0.4 mg total) under the tongue every 5 (five) minutes as needed for chest pain.  . pravastatin (PRAVACHOL) 20 MG tablet Take 1 tablet by mouth once daily  . propranolol (INDERAL) 20 MG tablet Take 1 tablet (20 mg total) by mouth 2 (two) times daily.   No facility-administered encounter medications on file as of 09/07/2018.    ALLERGIES: Allergies  Allergen Reactions  . Dilaudid [Hydromorphone]     Itching and vomitting  . Hydromorphone   . Valproic Acid And Related    VACCINATION STATUS: Immunization History  Administered Date(s) Administered  . Influenza,inj,Quad PF,6+ Mos 04/27/2017  . Pneumococcal Polysaccharide-23 05/02/2005    HPI This is a telephone visit. Sherry Glass has T3 toxicosis with clinical hyperthyroidism.   -She is status post radioactive iodine thyroid ablation on June 29, 2018.  She reports improvement in her prior symptoms including palpitations, tremors, heat intolerance.  She is gaining weight.  Her previsit labs show RAI induced  hypothyroidism.  The patient denies family history of thyroid dysfunction- she is adopted.  - She has well-controlled type 2 diabetes with A1c of 6.1% on January 30, 2018.  She remains on metformin and glimepiride .  - She reports that she has quit smoking after decades of heavy smoking.     Review of  Systems. Limited as above   Objective:    There were no vitals taken for this visit.  Wt Readings from Last 3 Encounters:  07/19/18 213 lb (96.6 kg)  07/02/18 210 lb (95.3 kg)  06/15/18 211 lb 9.6 oz (96 kg)    Physical Exam   Lipid Panel     Component Value Date/Time   CHOL 176 07/02/2018 1328   TRIG 267 (H) 07/02/2018 1328   HDL 46 07/02/2018 1328   CHOLHDL 3.8 07/02/2018 1328   LDLCALC 77 07/02/2018 1328   Recent Results (from the past 2160 hour(s))  Bayer DCA Hb A1c Waived     Status: Abnormal   Collection Time: 07/02/18 12:21 PM  Result Value Ref Range   HB A1C (BAYER DCA - WAIVED) 7.7 (H) <7.0 %    Comment:                                       Diabetic Adult            <7.0                                       Healthy Adult        4.3 - 5.7                                                           (DCCT/NGSP) American Diabetes Association's Summary of Glycemic Recommendations for Adults with Diabetes: Hemoglobin A1c <7.0%. More stringent glycemic goals (A1c <6.0%) may further reduce complications at the cost of increased risk of hypoglycemia.   Microalbumin / creatinine urine ratio     Status: None   Collection Time: 07/02/18  1:27 PM  Result Value Ref Range   Creatinine, Urine 83.3 Not Estab. mg/dL   Microalbumin, Urine 6.9 Not Estab. ug/mL   Microalb/Creat Ratio 8 0 - 29 mg/g creat    Comment:                        Normal:                0 -  29                        Moderately increased: 30 - 300                        Severely increased:       >300               **Please note reference interval change**   CMP14+EGFR     Status: Abnormal   Collection  Time: 07/02/18  1:28 PM  Result Value Ref Range   Glucose 183 (H) 65 - 99 mg/dL   BUN 8 6 - 24 mg/dL   Creatinine, Ser 0.40 (L) 0.57 - 1.00 mg/dL   GFR calc non Af Amer 123 >59 mL/min/1.73   GFR calc Af Amer 141 >59 mL/min/1.73   BUN/Creatinine Ratio 20 9 - 23   Sodium 139 134 - 144 mmol/L   Potassium  4.6 3.5 - 5.2 mmol/L   Chloride 100 96 - 106 mmol/L   CO2 24 20 - 29 mmol/L   Calcium 10.1 8.7 - 10.2 mg/dL   Total Protein 7.2 6.0 - 8.5 g/dL   Albumin 4.5 3.8 - 4.8 g/dL   Globulin, Total 2.7 1.5 - 4.5 g/dL   Albumin/Globulin Ratio 1.7 1.2 - 2.2   Bilirubin Total 0.5 0.0 - 1.2 mg/dL   Alkaline Phosphatase 98 39 - 117 IU/L   AST 27 0 - 40 IU/L   ALT 40 (H) 0 - 32 IU/L  CBC with Differential/Platelet     Status: Abnormal   Collection Time: 07/02/18  1:28 PM  Result Value Ref Range   WBC 8.7 3.4 - 10.8 x10E3/uL   RBC 5.06 3.77 - 5.28 x10E6/uL   Hemoglobin 14.9 11.1 - 15.9 g/dL   Hematocrit 44.5 34.0 - 46.6 %   MCV 88 79 - 97 fL   MCH 29.4 26.6 - 33.0 pg   MCHC 33.5 31.5 - 35.7 g/dL   RDW 13.2 11.7 - 15.4 %   Platelets 256 150 - 450 x10E3/uL   Neutrophils 50 Not Estab. %   Lymphs 41 Not Estab. %   Monocytes 7 Not Estab. %   Eos 2 Not Estab. %   Basos 0 Not Estab. %   Neutrophils Absolute 4.4 1.4 - 7.0 x10E3/uL   Lymphocytes Absolute 3.6 (H) 0.7 - 3.1 x10E3/uL   Monocytes Absolute 0.6 0.1 - 0.9 x10E3/uL   EOS (ABSOLUTE) 0.1 0.0 - 0.4 x10E3/uL   Basophils Absolute 0.0 0.0 - 0.2 x10E3/uL   Immature Granulocytes 0 Not Estab. %   Immature Grans (Abs) 0.0 0.0 - 0.1 x10E3/uL  Lipid panel     Status: Abnormal   Collection Time: 07/02/18  1:28 PM  Result Value Ref Range   Cholesterol, Total 176 100 - 199 mg/dL   Triglycerides 267 (H) 0 - 149 mg/dL   HDL 46 >39 mg/dL   VLDL Cholesterol Cal 53 (H) 5 - 40 mg/dL   LDL Calculated 77 0 - 99 mg/dL   Chol/HDL Ratio 3.8 0.0 - 4.4 ratio    Comment:                                   T. Chol/HDL Ratio                                              Men  Women                               1/2 Avg.Risk  3.4    3.3                                   Avg.Risk  5.0    4.4                                2X Avg.Risk  9.6    7.1  3X Avg.Risk 23.4   11.0   TSH     Status: Abnormal   Collection Time: 08/10/18  1:16 PM  Result Value Ref Range   TSH 0.02 (L) mIU/L    Comment:           Reference Range .           > or = 20 Years  0.40-4.50 .                Pregnancy Ranges           First trimester    0.26-2.66           Second trimester   0.55-2.73           Third trimester    0.43-2.91   T4, free     Status: None   Collection Time: 08/10/18  1:16 PM  Result Value Ref Range   Free T4 1.8 0.8 - 1.8 ng/dL  T3, free     Status: Abnormal   Collection Time: 08/10/18  1:16 PM  Result Value Ref Range   T3, Free 4.3 (H) 2.3 - 4.2 pg/mL  T4, free     Status: Abnormal   Collection Time: 09/06/18  8:03 AM  Result Value Ref Range   Free T4 0.7 (L) 0.8 - 1.8 ng/dL  TSH     Status: Abnormal   Collection Time: 09/06/18  8:03 AM  Result Value Ref Range   TSH 0.04 (L) mIU/L    Comment:           Reference Range .           > or = 20 Years  0.40-4.50 .                Pregnancy Ranges           First trimester    0.26-2.66           Second trimester   0.55-2.73           Third trimester    0.43-2.91      - Thyroid uptake and scan on 07/19/2016 was nonfocal with 25% uptake and 24 hours.  March 05, 2018 thyroid uptake and scan: FINDINGS: Normal thyroid scan.  No hot or cold nodules are identified.  4 hour I-131 uptake = 17.6% (normal 5-20%) 24 hour I-131 uptake = 30.0% (normal 10-30%)  IMPRESSION: 1. Normal thyroid scan. 2. Top-normal 24 hour iodine 131 uptake at 30%.  I-131 thyroid ablation on June 29, 2018.  Assessment & Plan:   1.  RAI induced hypothyroidism  2.  Type 2 diabetes -She is status post thyroid ablation with I-131 for significant T3 toxicosis.  Treatment was  administered on June 29, 2018.  Her previsit labs show treatment effect with RAI induced hypothyroidism.   -I discussed and initiated thyroid hormone replacement with levothyroxine 50 mcg p.o. every morning.  She will likely need a higher dose of thyroid hormone on subsequent visits.   - We discussed about the correct intake of her thyroid hormone, on empty stomach at fasting, with water, separated by at least 30 minutes from breakfast and other medications,  and separated by more than 4 hours from calcium, iron, multivitamins, acid reflux medications (PPIs). -Patient is made aware of the fact that thyroid hormone replacement is needed for life, dose to be adjusted by periodic monitoring of thyroid function tests.  She has type 2 diabetes with recent A1c of 7.7%. She is  advised to continue metformin 1000 mg p.o. twice daily and follow-up with her PCP.  - I advised patient to maintain close follow up with Sherry Balloon, [name] for primary care needs, including diabetes care.  - Time spent with the patient: 20 minutes. Sherry Glass participated in the discussions, expressed understanding, and voiced agreement with the above plans.  All questions were answered to her satisfaction. she is encouraged to contact clinic should she have any questions or concerns prior to her return visit.  Follow up plan: Return in about 9 weeks (around 11/09/2018) for Follow up with Pre-visit Labs.  Glade Lloyd, MD Phone: [phone]  Fax: [phone]   09/07/2018, 5:42 PM

## 2018-09-14 ENCOUNTER — Encounter: Payer: Self-pay | Admitting: Physical Therapy

## 2018-09-14 ENCOUNTER — Other Ambulatory Visit: Payer: Self-pay

## 2018-09-14 ENCOUNTER — Ambulatory Visit: Payer: Medicaid Other | Attending: Family | Admitting: Physical Therapy

## 2018-09-14 DIAGNOSIS — M25561 Pain in right knee: Secondary | ICD-10-CM

## 2018-09-14 DIAGNOSIS — R262 Difficulty in walking, not elsewhere classified: Secondary | ICD-10-CM | POA: Insufficient documentation

## 2018-09-14 NOTE — Therapy (Signed)
[facility] [address] [place], [place], [address] Phone: [phone]   Fax:  [phone]  Physical Therapy Treatment  Patient Details  Name: Sherry Glass MRN: [id number] Date of Birth: 04/12/1971 Referring Provider (PT): Jannifer Rodney, [name]   Encounter Date: 09/14/2018  PT End of Session - 09/14/18 0818    Visit Number  2    Number of Visits  12    Date for PT Re-Evaluation  10/19/18    Authorization Type  Medicaid    PT Start Time  0816    PT Stop Time  0859    PT Time Calculation (min)  43 min    Activity Tolerance  Patient tolerated treatment well    Behavior During Therapy  [facility] for tasks assessed/performed       Past Medical History:  Diagnosis Date  . Allergic rhinitis   . Anxiety and depression   . Cataract   . Chest pain syndrome   . COPD (chronic obstructive pulmonary disease) (HCC)   . Depression   . Diabetes mellitus without complication (HCC)   . History of kidney stones   . HTN (hypertension)   . Hyperglycemia   . Hypertension   . Obesity   . Restless leg syndrome   . Tobacco abuse     Past Surgical History:  Procedure Laterality Date  . ABDOMINAL HYSTERECTOMY    . CARDIAC CATHETERIZATION  2014  . CATARACT EXTRACTION W/PHACO Left 06/23/2016   Procedure: CATARACT EXTRACTION PHACO AND INTRAOCULAR LENS PLACEMENT (IOC);  Surgeon: Gemma Payor, MD;  Location: [facility];  Service: Ophthalmology;  Laterality: Left;  left - pt knows to arrive at 10:50 cde 9.11  . CESAREAN SECTION    . CHOLECYSTECTOMY    . EXCISION MASS NECK     cyst  . FOOT MASS EXCISION    . hysteectomy --- unknown    . left foot surgery     removal of cyst  . NECK SURGERY    . TONSILLECTOMY      There were no vitals filed for this visit.  Subjective Assessment - 09/14/18 0817    Subjective  COVID 19 screening performed on patient prior to entering building. Reports that she has the most pain with prolonged activities.    Pertinent  History  HTN, COPD, Asthma, DM    Limitations  Sitting;House hold activities;Walking;Standing    How long can you sit comfortably?  30 minutes    How long can you stand comfortably?  10 minutes but reports legs go numb    How long can you walk comfortably?  10 minutes    Diagnostic tests  X-ray:    Patient Stated Goals  decrease pain, improve ability to perform home activities    Currently in Pain?  Yes    Pain Score  4     Pain Location  Knee    Pain Orientation  Right    Pain Descriptors / Indicators  Aching    Pain Type  Acute pain    Pain Onset  More than a month ago    Pain Frequency  Constant         [facility] PT Assessment - 09/14/18 0001      Assessment   Medical Diagnosis  acute right knee pain, sprain of right knee unspecified ligament    Referring Provider (PT)  Jannifer Rodney, [name]    Next MD Visit  June 2020    Prior Therapy  no  Precautions   Precautions  None      Restrictions   Weight Bearing Restrictions  No      ROM / Strength   AROM / PROM / Strength  AROM      AROM   Overall AROM   Within functional limits for tasks performed    AROM Assessment Site  Knee    Right/Left Knee  Right    Right Knee Extension  0    Right Knee Flexion  113                   [facility] Adult PT Treatment/Exercise - 09/14/18 0001      Exercises   Exercises  Knee/Hip      Knee/Hip Exercises: Aerobic   Nustep  L4, seat 8 x10 min      Knee/Hip Exercises: Standing   Heel Raises  Both;20 reps    Heel Raises Limitations  B toe raise x20 reps    Terminal Knee Extension  Strengthening;Right;20 reps;Theraband    Theraband Level (Terminal Knee Extension)  Level 2 (Red)    Hip Abduction  AROM;Right;20 reps;Knee straight    Rocker Board  2 minutes      Knee/Hip Exercises: Seated   Long Arc Quad  Strengthening;Right;20 reps;Weights    Long Arc Quad Weight  4 lbs.    Clamshell with TheraBand  Red   x20 reps     Knee/Hip Exercises: Supine   Straight Leg Raises   AROM;Right;20 reps    Straight Leg Raise with External Rotation  AROM;Right;20 reps      Modalities   Modalities  Vasopneumatic      Vasopneumatic   Number Minutes Vasopneumatic   15 minutes    Vasopnuematic Location   Knee    Vasopneumatic Pressure  Medium    Vasopneumatic Temperature   34               PT Short Term Goals - 09/14/18 0819      PT SHORT TERM GOAL #1   Title  Patient will be independent with HEP    Baseline  no knowledge of exercises    Time  3    Period  Weeks    Status  Achieved      PT SHORT TERM GOAL #2   Title  Patient will increase right knee flexion AROM to 95 degrees or greater to improve functional tasks    Baseline  90 degrees right knee flexion AROM    Time  3    Period  Weeks    Status  Achieved        PT Long Term Goals - 09/14/18 8502      PT LONG TERM GOAL #1   Title  Patient will demonstrate 120+ degrees of right knee flexion AROM to improve functional tasks.     Baseline  90 degrees of right knee flexion AROM    Time  6    Period  Weeks    Status  On-going      PT LONG TERM GOAL #2   Title  Patient will demonstrate 3 degrees or less of right knee extension AROM to improve gait mechanics.    Baseline  5 degrees of right knee extension AROM    Time  6    Period  Weeks    Status  Achieved      PT LONG TERM GOAL #3   Title  Patient will report ability to perform ADLs with  right knee pain less than 4/10.    Baseline  8/10 right knee pain    Time  6    Period  Weeks    Status  On-going            Plan - 09/14/18 0851    Clinical Impression Statement  Patient presented in clinic with reports of 4/10 R knee pain and reports of limitations within ADLs caused by knee pain. Patient experiences greatest pain with sweeping or mopping with pain rated approximately 6/10. Patient guided through low level R knee and hip strengthening with intermittant reports of discomfort such as during SLR in R hip. Good technique of all therex  noted following initial VCs and demo. Patient's R knee AROM improved to 0-113 deg today in supine. Minimal to no R extensor lag present during SLRs. Patient reports compliance with icing at home for pain control and HEP to which she was educated to continue as directed. By the end of therex session, patient report 6-7/10 R knee pain. Normal vasopneumatic response noted following removal of the modality.    Personal Factors and Comorbidities  Comorbidity 3+    Comorbidities  HTN, asthma, COPD, DM    Examination-Activity Limitations  Bend;Squat    Examination-Participation Restrictions  Laundry;Cleaning;Meal Prep    Stability/Clinical Decision Making  Stable/Uncomplicated    Rehab Potential  Good    PT Frequency  2x / week    PT Duration  6 weeks    PT Treatment/Interventions  ADLs/Self Care Home Management;Electrical Stimulation;Moist Heat;Cryotherapy;Ultrasound;Therapeutic activities;Therapeutic exercise;Neuromuscular re-education;Balance training;Manual techniques;Passive range of motion;Vasopneumatic Device;Taping;Patient/family education    PT Next Visit Plan  Continue with R knee/hip strengthening with modalities for pain control PRN.     PT Home Exercise Plan  see patient instructions    Consulted and Agree with Plan of Care  Patient       Patient will benefit from skilled therapeutic intervention in order to improve the following deficits and impairments:  Pain, Decreased range of motion, Decreased activity tolerance, Decreased endurance, Difficulty walking  Visit Diagnosis: Acute pain of right knee  Difficulty in walking, not elsewhere classified     Problem List Patient Active Problem List   Diagnosis Date Noted  . 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  . Nocturnal hypoxemia 05/29/2017  . GAD (generalized anxiety disorder) 02/13/2017  . Restrictive lung disease 01/27/2017  . Lung nodule < 6cm on CT 01/27/2017  . GERD (gastroesophageal reflux disease)  11/18/2016  . Snoring 11/18/2016  . Witnessed episode of apnea 11/18/2016  . Chronic seasonal allergic rhinitis 11/18/2016  . Restless leg syndrome 11/18/2016  . Fibromyalgia 11/18/2016  . Hyperlipidemia associated with type 2 diabetes mellitus (HCC) 06/09/2016  . Vitamin D deficiency 05/16/2016  . Hyperthyroidism 05/16/2016  . Morbid obesity (HCC) 05/13/2016  . Uncontrolled type 2 diabetes mellitus with hyperglycemia (HCC) 05/13/2016  . COPD (chronic obstructive pulmonary disease) (HCC) 05/13/2016  . Chest pain syndrome   . Hypertension associated with diabetes (HCC)   . Stopped smoking with greater than 25 pack year history     Marvell FullerKelsey P Cejay Cambre, PTA 09/14/2018, 9:19 AM  [facility] [address] [place], [place], [address] Phone: [phone]   Fax:  [phone]  Name: Sherry Glass MRN: [id number] Date of Birth: 28-Jul-1970

## 2018-09-21 ENCOUNTER — Encounter: Payer: Self-pay | Admitting: Primary Care

## 2018-09-21 ENCOUNTER — Other Ambulatory Visit: Payer: Self-pay

## 2018-09-21 ENCOUNTER — Ambulatory Visit (INDEPENDENT_AMBULATORY_CARE_PROVIDER_SITE_OTHER): Payer: Medicaid Other | Admitting: Primary Care

## 2018-09-21 ENCOUNTER — Ambulatory Visit: Payer: Medicaid Other | Admitting: Physical Therapy

## 2018-09-21 ENCOUNTER — Encounter: Payer: Self-pay | Admitting: Physical Therapy

## 2018-09-21 DIAGNOSIS — J441 Chronic obstructive pulmonary disease with (acute) exacerbation: Secondary | ICD-10-CM

## 2018-09-21 DIAGNOSIS — M25561 Pain in right knee: Secondary | ICD-10-CM

## 2018-09-21 DIAGNOSIS — R262 Difficulty in walking, not elsewhere classified: Secondary | ICD-10-CM | POA: Diagnosis not present

## 2018-09-21 MED ORDER — PREDNISONE 10 MG PO TABS: ORAL_TABLET | ORAL | 0 refills | Status: DC

## 2018-09-21 NOTE — Progress Notes (Signed)
Virtual Visit via Telephone Note  I connected with Sherry Glass on 09/21/18 at 10:30 AM EDT by telephone and verified that I am speaking with the correct person using two identifiers.  Location: Patient: Home Provider: Office   I discussed the limitations, risks, security and privacy concerns of performing an evaluation and management service by telephone and the availability of in person appointments. I also discussed with the patient that there may be a patient responsible charge related to this service. The patient expressed understanding and agreed to proceed.   History of Present Illness: 48 year old female, former smoker(25 pack year hx). PMH significant for COPD, chronic seasonal allergic rhinitis, restrictive lung disease, nocturnal hypoxia, pulmonary nodular disease, diabetes, obesity. Patient of Dr. Delton Glass, last seen on 02/02/18. Most recent CT chest 11/27/17 showed stable right upper lobe ground-glass nodule. Needs final follow up for interval stability in July 2020. Cough was worse on Stiolto, changed back to [employer].    Previous [place] encounter: 08/17/2018 Called today for 6 month follow-up. States that Sherry Glass was not covered. Currently taking Pulmicort twice daily. Still has issues catching her breath during the day. Uses albuterol nebulizer three a times a day. Needs CT chest in July 2020.   09/21/2018 Patient called today for regular office visit. Perforomist nebulizer's were added during last visit and continues Pulmicort twice daily. Reports that she is taking inhalers as prescribed. Wheezing more lately, thinks it might be related to the weather. She has a non-productive cough. She has tried Singulair in the past with no reported improvement. Not currently on daily antihistamine.  BS 100-150.    Observations/Objective:  - No observed shortness of breath, wheezing or cough   Assessment and Plan:  COPD - Reports intermittent wheezing  - RX prednisone 20mg  x 5 days -  Continue Perforomist and Pulmicort twice daily - Advised mucinex twice daily   Seasonal allergies - Advised daily antihistamine   Follow Up Instructions:  - Dr. Delton Glass in July after CT scan - Or sooner if symptoms do not improve   I discussed the assessment and treatment plan with the patient. The patient was provided an opportunity to ask questions and all were answered. The patient agreed with the plan and demonstrated an understanding of the instructions.   The patient was advised to call back or seek an in-person evaluation if the symptoms worsen or if the condition fails to improve as anticipated.  I provided 15 minutes of non-face-to-face time during this encounter.   Glenford Bayley, NP

## 2018-09-21 NOTE — Therapy (Addendum)
[facility] [place], [place], [address] Phone: [phone]   Fax:  [phone]  Physical Therapy Treatment PHYSICAL THERAPY DISCHARGE SUMMARY  Visits from Start of Care: 3  Current functional level related to goals / functional outcomes: See below   Remaining deficits: See goals   Education / Equipment: HEP Plan: Patient agrees to discharge.  Patient goals were partially met. Patient is being discharged due to being pleased with the current functional level.  ?????   Gabriela Eves, PT, DPT 06/20/19  Patient Details  Name: Sherry Glass MRN: [id number] Date of Birth: December 10, 1970 Referring Provider (PT): Evelina Dun, [name]   Encounter Date: 09/21/2018  PT End of Session - 09/21/18 1112    Visit Number  3    Number of Visits  12    Date for PT Re-Evaluation  10/19/18    Authorization Type  Medicaid    PT Start Time  1109    PT Stop Time  1206    PT Time Calculation (min)  57 min    Activity Tolerance  Patient tolerated treatment well    Behavior During Therapy  [facility] for tasks assessed/performed       Past Medical History:  Diagnosis Date  . Allergic rhinitis   . Anxiety and depression   . Cataract   . Chest pain syndrome   . COPD (chronic obstructive pulmonary disease) ([place])   . Depression   . Diabetes mellitus without complication ([place])   . History of kidney stones   . HTN (hypertension)   . Hyperglycemia   . Hypertension   . Obesity   . Restless leg syndrome   . Tobacco abuse     Past Surgical History:  Procedure Laterality Date  . ABDOMINAL HYSTERECTOMY    . CARDIAC CATHETERIZATION  2014  . CATARACT EXTRACTION W/PHACO Left 06/23/2016   Procedure: CATARACT EXTRACTION PHACO AND INTRAOCULAR LENS PLACEMENT (IOC);  Surgeon: Tonny Branch, MD;  Location: [facility];  Service: Ophthalmology;  Laterality: Left;  left - pt knows to arrive at 10:50 cde 9.11  . CESAREAN SECTION    . CHOLECYSTECTOMY    .  EXCISION MASS NECK     cyst  . FOOT MASS EXCISION    . hysteectomy --- unknown    . left foot surgery     removal of cyst  . NECK SURGERY    . TONSILLECTOMY      There were no vitals filed for this visit.  Subjective Assessment - 09/21/18 1111    Subjective  COVID 19 screening performed on patient prior to entering building. Reports that she has improved since she was here last but denies any pain with activities.    Pertinent History  HTN, COPD, Asthma, DM    Limitations  Sitting;House hold activities;Walking;Standing    How long can you sit comfortably?  30 minutes    How long can you stand comfortably?  10 minutes but reports legs go numb    How long can you walk comfortably?  10 minutes    Diagnostic tests  X-ray:    Patient Stated Goals  decrease pain, improve ability to perform home activities    Currently in Pain?  No/denies         [facility] PT Assessment - 09/21/18 0001      Assessment   Medical Diagnosis  acute right knee pain, sprain of right knee unspecified ligament    Referring Provider (PT)  Evelina Dun, [name]  Next MD Visit  June 2020    Prior Therapy  no      Precautions   Precautions  None      Restrictions   Weight Bearing Restrictions  No      ROM / Strength   AROM / PROM / Strength  AROM      AROM   Overall AROM   Within functional limits for tasks performed    AROM Assessment Site  Knee    Right/Left Knee  Right    Right Knee Extension  0    Right Knee Flexion  134                   [facility] Adult PT Treatment/Exercise - 09/21/18 0001      Knee/Hip Exercises: Aerobic   Nustep  L4, seat 8 x10 min      Knee/Hip Exercises: Standing   Heel Raises  Both;20 reps    Heel Raises Limitations  B toe raise x20 reps    Terminal Knee Extension  Strengthening;Right;20 reps;Theraband    Theraband Level (Terminal Knee Extension)  Level 2 (Red)    Hip Abduction  AROM;Right;20 reps;Knee straight    Lateral Step Up  Right;2 sets;10 reps;Hand  Hold: 2;Step Height: 6"    Forward Step Up  Right;2 sets;10 reps;Hand Hold: 2;Step Height: 6"    Rocker Board  4 minutes      Knee/Hip Exercises: Seated   Long Arc Quad  Strengthening;Right;3 sets;10 reps;Weights    Long Arc Quad Weight  4 lbs.    Clamshell with TheraBand  Red   x20 reps   Hamstring Curl  Strengthening;Right;2 sets;10 reps;Limitations    Hamstring Limitations  red theraband      Knee/Hip Exercises: Supine   Straight Leg Raises  AROM;Right;20 reps    Straight Leg Raise with External Rotation  AROM;Right;20 reps      Modalities   Modalities  Vasopneumatic      Vasopneumatic   Number Minutes Vasopneumatic   15 minutes    Vasopnuematic Location   Knee    Vasopneumatic Pressure  Medium    Vasopneumatic Temperature   68               PT Short Term Goals - 09/14/18 0819      PT SHORT TERM GOAL #1   Title  Patient will be independent with HEP    Baseline  no knowledge of exercises    Time  3    Period  Weeks    Status  Achieved      PT SHORT TERM GOAL #2   Title  Patient will increase right knee flexion AROM to 95 degrees or greater to improve functional tasks    Baseline  90 degrees right knee flexion AROM    Time  3    Period  Weeks    Status  Achieved        PT Long Term Goals - 09/21/18 1141      PT LONG TERM GOAL #1   Title  Patient will demonstrate 120+ degrees of right knee flexion AROM to improve functional tasks.     Baseline  90 degrees of right knee flexion AROM    Time  6    Period  Weeks    Status  Achieved      PT LONG TERM GOAL #2   Title  Patient will demonstrate 3 degrees or less of right knee extension AROM to improve gait  mechanics.    Baseline  5 degrees of right knee extension AROM    Time  6    Period  Weeks    Status  Achieved      PT LONG TERM GOAL #3   Title  Patient will report ability to perform ADLs with right knee pain less than 4/10.    Baseline  8/10 right knee pain    Time  6    Period  Weeks    Status   Partially Met            Plan - 09/21/18 1151    Clinical Impression Statement  Patient presented in clinic with reports of no R knee pain and pain mostly diminished with ADLs. Patient demonstrated great improvements in ROM as well since last treatment. AROM of R knee measured as 0-134 deg. Patient progressed along with therex today with intermittant reports of B hip discomfort. No extensor lag noted during today's treatment. Normal vasopnuematic response noted following removal of the modality after reporting some R knee discomfort following therex.    Personal Factors and Comorbidities  Comorbidity 3+    Comorbidities  HTN, asthma, COPD, DM    Examination-Activity Limitations  Bend;Squat    Examination-Participation Restrictions  Laundry;Cleaning;Meal Prep    Stability/Clinical Decision Making  Stable/Uncomplicated    Rehab Potential  Good    PT Frequency  2x / week    PT Duration  6 weeks    PT Treatment/Interventions  ADLs/Self Care Home Management;Electrical Stimulation;Moist Heat;Cryotherapy;Ultrasound;Therapeutic activities;Therapeutic exercise;Neuromuscular re-education;Balance training;Manual techniques;Passive range of motion;Vasopneumatic Device;Taping;Patient/family education    PT Next Visit Plan  Continue with R knee/hip strengthening with modalities for pain control PRN.     PT Home Exercise Plan  see patient instructions    Consulted and Agree with Plan of Care  Patient       Patient will benefit from skilled therapeutic intervention in order to improve the following deficits and impairments:  Pain, Decreased range of motion, Decreased activity tolerance, Decreased endurance, Difficulty walking  Visit Diagnosis: Acute pain of right knee  Difficulty in walking, not elsewhere classified     Problem List Patient Active Problem List   Diagnosis Date Noted  . 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  . Nocturnal hypoxemia 05/29/2017  . GAD  (generalized anxiety disorder) 02/13/2017  . Restrictive lung disease 01/27/2017  . Lung nodule < 6cm on CT 01/27/2017  . GERD (gastroesophageal reflux disease) 11/18/2016  . Snoring 11/18/2016  . Witnessed episode of apnea 11/18/2016  . Chronic seasonal allergic rhinitis 11/18/2016  . Restless leg syndrome 11/18/2016  . Fibromyalgia 11/18/2016  . Hyperlipidemia associated with type 2 diabetes mellitus ([place]) 06/09/2016  . Vitamin D deficiency 05/16/2016  . Hyperthyroidism 05/16/2016  . Morbid obesity ([place]) 05/13/2016  . Uncontrolled type 2 diabetes mellitus with hyperglycemia ([place]) 05/13/2016  . COPD (chronic obstructive pulmonary disease) ([place]) 05/13/2016  . Chest pain syndrome   . Hypertension associated with diabetes ([place])   . Stopped smoking with greater than 25 pack year history     Standley Brooking, [place] 09/21/2018, 12:25 PM  [facility] [place], [place], [address] Phone: [phone]   Fax:  [phone]  Name: Sherry Glass MRN: [id number] Date of Birth: 10-Apr-1971

## 2018-09-21 NOTE — Patient Instructions (Addendum)
Rx: Prednisone 20mg  daily x 5 days (monitor blood sugar if >250 contact PCP)  Recommendations: Start daily antihistamine such as Claritin or Zyrtec  Take mucinex twice daily  Continue Perforomist and Pulmicort twice daily Albuterol as needed every 4-6 hours for breakthrough shortness of breath/wheezing    Follow up: Dr. Delton [place]es in July after CT scan Or sooner if symptoms do not improve

## 2018-10-05 ENCOUNTER — Ambulatory Visit: Payer: Self-pay | Admitting: "Endocrinology

## 2018-10-12 ENCOUNTER — Encounter: Payer: Self-pay | Admitting: Family

## 2018-10-12 ENCOUNTER — Ambulatory Visit (INDEPENDENT_AMBULATORY_CARE_PROVIDER_SITE_OTHER): Payer: Medicaid Other | Admitting: Family

## 2018-10-12 DIAGNOSIS — E1165 Type 2 diabetes mellitus with hyperglycemia: Secondary | ICD-10-CM | POA: Diagnosis not present

## 2018-10-12 DIAGNOSIS — E785 Hyperlipidemia, unspecified: Secondary | ICD-10-CM

## 2018-10-12 DIAGNOSIS — J984 Other disorders of lung: Secondary | ICD-10-CM

## 2018-10-12 DIAGNOSIS — J449 Chronic obstructive pulmonary disease, unspecified: Secondary | ICD-10-CM

## 2018-10-12 DIAGNOSIS — F320 Major depressive disorder, single episode, mild: Secondary | ICD-10-CM | POA: Insufficient documentation

## 2018-10-12 DIAGNOSIS — I10 Essential (primary) hypertension: Secondary | ICD-10-CM | POA: Diagnosis not present

## 2018-10-12 DIAGNOSIS — E890 Postprocedural hypothyroidism: Secondary | ICD-10-CM | POA: Diagnosis not present

## 2018-10-12 DIAGNOSIS — F411 Generalized anxiety disorder: Secondary | ICD-10-CM

## 2018-10-12 DIAGNOSIS — K219 Gastro-esophageal reflux disease without esophagitis: Secondary | ICD-10-CM

## 2018-10-12 DIAGNOSIS — E1169 Type 2 diabetes mellitus with other specified complication: Secondary | ICD-10-CM | POA: Diagnosis not present

## 2018-10-12 DIAGNOSIS — E1159 Type 2 diabetes mellitus with other circulatory complications: Secondary | ICD-10-CM

## 2018-10-12 NOTE — Progress Notes (Signed)
Virtual Visit via telephone Note  I connected with Sherry Glass on 10/12/18 at 9:29 AM by telephone and verified that I am speaking with the correct person using two identifiers. Sherry Glass is currently located at home and no one is currently with her during visit. The provider, Evelina Dun, [name] is located in their office at time of visit.  I discussed the limitations, risks, security and privacy concerns of performing an evaluation and management service by telephone and the availability of in person appointments. I also discussed with the patient that there may be a patient responsible charge related to this service. The patient expressed understanding and agreed to proceed.   History and Present Illness:  Pt presents to the office today for chronic follow up. Pt is followed by Pulmonologist27monthsfor COPD, Restrictive lung disease, and SOB. Followed by Endocrinologists every 3 months for hyperthyroidism. She had thyroid ablation.  Diabetes She presents for her follow-up diabetic visit. She has type 2 diabetes mellitus. Her disease course has been stable. Hypoglycemia symptoms include nervousness/anxiousness. Associated symptoms include blurred vision, fatigue and foot paresthesias. Symptoms are stable. Diabetic complications include peripheral neuropathy. Pertinent negatives for diabetic complications include no CVA or heart disease. Risk factors for coronary artery disease include dyslipidemia, diabetes mellitus, hypertension, sedentary lifestyle and post-menopausal. Her overall blood glucose range is 110-130 mg/dl. Eye exam is not current.  Hypertension This is a chronic problem. The current episode started more than 1 year ago. The problem has been resolved (124/74) since onset. The problem is controlled. Associated symptoms include anxiety, blurred vision and malaise/fatigue. Pertinent negatives include no peripheral edema or shortness of breath. Risk factors for coronary artery  disease include diabetes mellitus, dyslipidemia, obesity and sedentary lifestyle. The current treatment provides moderate improvement. There is no history of CVA. Identifiable causes of hypertension include a thyroid problem. There is no history of chronic renal disease.  Hyperlipidemia This is a chronic problem. The current episode started more than 1 year ago. The problem is controlled. Recent lipid tests were reviewed and are normal. She has no history of chronic renal disease. Pertinent negatives include no shortness of breath. Current antihyperlipidemic treatment includes statins. The current treatment provides moderate improvement of lipids. Risk factors for coronary artery disease include dyslipidemia, diabetes mellitus, hypertension, post-menopausal and a sedentary lifestyle.  Gastroesophageal Reflux She complains of heartburn. She reports no belching. This is a chronic problem. The current episode started more than 1 year ago. The problem occurs occasionally. The symptoms are aggravated by certain foods. Associated symptoms include fatigue. Risk factors include obesity. The treatment provided moderate relief.  Thyroid Problem Presents for follow-up visit. Symptoms include anxiety, depressed mood, dry skin and fatigue. The symptoms have been stable. Her past medical history is significant for hyperlipidemia.  Anxiety Presents for follow-up visit. Symptoms include depressed mood, excessive worry, irritability, nervous/anxious behavior and restlessness. Patient reports no shortness of breath. Symptoms occur occasionally. The quality of sleep is good.    Depression        This is a chronic problem.  The current episode started more than 1 year ago.   The onset quality is gradual.   The problem occurs intermittently.  The problem has been waxing and waning since onset.  Associated symptoms include fatigue, irritable, restlessness and sad.  Associated symptoms include no helplessness and no  hopelessness.  Past treatments include SSRIs - Selective serotonin reuptake inhibitors.  Past medical history includes thyroid problem and anxiety.  Review of Systems  Constitutional: Positive for fatigue, irritability and malaise/fatigue.  Eyes: Positive for blurred vision.  Respiratory: Negative for shortness of breath.   Gastrointestinal: Positive for heartburn.  Psychiatric/Behavioral: Positive for depression. The patient is nervous/anxious.   All other systems reviewed and are negative.    Observations/Objective: No SOB or distress noted  Assessment and Plan: Sherry Glass comes in today with chief complaint of No chief complaint on file.   Diagnosis and orders addressed:  1. Hypertension associated with diabetes (HCC)  2. Restrictive lung disease  3. Chronic obstructive pulmonary disease, unspecified COPD type (HCC)  4. Gastroesophageal reflux disease, esophagitis presence not specified  5. Uncontrolled type 2 diabetes mellitus with hyperglycemia (HCC)  6. Hypothyroidism following radioiodine therapy  7. Hyperlipidemia associated with type 2 diabetes mellitus (HCC)  8. Morbid obesity (HCC)  9. GAD (generalized anxiety disorder)  10. Depression, major, single episode, mild (HCC)   Labs reviewed- Will repeat on next appt Health Maintenance reviewed Diet and exercise encouraged  Follow up plan: 3 months       I discussed the assessment and treatment plan with the patient. The patient was provided an opportunity to ask questions and all were answered. The patient agreed with the plan and demonstrated an understanding of the instructions.   The patient was advised to call back or seek an in-person evaluation if the symptoms worsen or if the condition fails to improve as anticipated.  The above assessment and management plan was discussed with the patient. The patient verbalized understanding of and has agreed to the management plan. Patient is aware to  call the clinic if symptoms persist or worsen. Patient is aware when to return to the clinic for a follow-up visit. Patient educated on when it is appropriate to go to the emergency department.   Time call ended:  9:42 AM  I provided 13 minutes of non-face-to-face time during this encounter.    Jannifer Rodneyhristy Hawks, [name]

## 2018-11-03 ENCOUNTER — Other Ambulatory Visit: Payer: Self-pay | Admitting: Family

## 2018-11-03 DIAGNOSIS — F411 Generalized anxiety disorder: Secondary | ICD-10-CM

## 2018-11-07 NOTE — Telephone Encounter (Signed)
This message was received from Patient about a letter he received.   11/07/18 4:36 PM Hi I got a letter saying I was denied to get my ct scan what do I need to do?  [facility] can you advise on this?

## 2018-11-14 ENCOUNTER — Other Ambulatory Visit: Payer: Self-pay | Admitting: Family

## 2018-11-15 ENCOUNTER — Inpatient Hospital Stay: Admission: RE | Admit: 2018-11-15 | Payer: Medicaid Other | Source: Ambulatory Visit

## 2018-11-15 DIAGNOSIS — E890 Postprocedural hypothyroidism: Secondary | ICD-10-CM | POA: Diagnosis not present

## 2018-11-15 LAB — TSH: TSH: 15.03 mIU/L — ABNORMAL HIGH

## 2018-11-15 LAB — T4, FREE: Free T4: 0.9 ng/dL (ref 0.8–1.8)

## 2018-11-22 ENCOUNTER — Ambulatory Visit (INDEPENDENT_AMBULATORY_CARE_PROVIDER_SITE_OTHER): Payer: Medicaid Other | Admitting: "Endocrinology

## 2018-11-22 ENCOUNTER — Encounter: Payer: Self-pay | Admitting: "Endocrinology

## 2018-11-22 ENCOUNTER — Other Ambulatory Visit: Payer: Self-pay

## 2018-11-22 DIAGNOSIS — E890 Postprocedural hypothyroidism: Secondary | ICD-10-CM

## 2018-11-22 MED ORDER — LEVOTHYROXINE SODIUM 100 MCG PO TABS: 100.0000 ug | ORAL_TABLET | Freq: Every day | ORAL | 3 refills | Status: DC

## 2018-11-22 NOTE — Progress Notes (Signed)
11/22/2018                                 Endocrinology Telehealth Visit Follow up Note -During COVID -19 Pandemic  I connected with Sherry Glass on 11/22/2018   by telephone and verified that I am speaking with the correct person using two identifiers. Sherry Glass, Sherry Glass. she has verbally consented to this visit. All issues noted in this document were discussed and addressed. The format was not optimal for physical exam.    Subjective:    Patient ID: Sherry Glass, female    DOB: Sherry Glass, PCP Sherry Glass.   Past Medical History:  Diagnosis Date  . Allergic rhinitis   . Anxiety and depression   . Cataract   . Chest pain syndrome   . COPD (chronic obstructive pulmonary disease) (HCC)   . Depression   . Diabetes mellitus without complication (HCC)   . History of kidney stones   . HTN (hypertension)   . Hyperglycemia   . Hypertension   . Obesity   . Restless leg syndrome   . Tobacco abuse    Past Surgical History:  Procedure Laterality Date  . ABDOMINAL HYSTERECTOMY    . CARDIAC CATHETERIZATION  2014  . CATARACT EXTRACTION W/PHACO Left 06/23/2016   Procedure: CATARACT EXTRACTION PHACO AND INTRAOCULAR LENS PLACEMENT (IOC);  Surgeon: Gemma PayorKerry Hunt, MD;  Location: [facility];  Service: Ophthalmology;  Laterality: Left;  left - pt knows to arrive at 10:50 cde 9.11  . CESAREAN SECTION    . CHOLECYSTECTOMY    . EXCISION MASS NECK     cyst  . FOOT MASS EXCISION    . hysteectomy --- unknown    . left foot surgery     removal of cyst  . NECK SURGERY    . TONSILLECTOMY     Social History   Socioeconomic History  . Marital status: Widowed    Spouse name: Not on file  . Number of children: Not on file  . Years of education: Not on file  . Highest education level: Not on file  Occupational History  . Not on file  Social Needs  . Financial resource strain: Not on file  . Food insecurity    Worry: Not on file    Inability: Not on file  . Transportation needs    Medical: Not on file    Non-medical: Not on file  Tobacco Use  . Smoking status: Former Smoker    Packs/day: 1.00    Years: 25.00    Pack years: 25.00    Types: Cigarettes    Start date: 11/13/1988    Quit date: 10/28/2016    Years since quitting: 2.0  . Smokeless tobacco: Never Used  Substance and Sexual Activity  . Alcohol use: Yes    Comment: For special occasions  . Drug use: No  . Sexual activity: Never    Birth control/protection: Surgical  Lifestyle  . Physical activity    Days per week: Not on file    Minutes per session: Not on file  . Stress: Not on file  Relationships  . Social Musicianconnections    Talks on phone: Not on file    Gets together: Not on file    Attends religious service: Not on file    Active member of club or organization: Not on file    Attends meetings of clubs or  organizations: Not on file    Relationship status: Not on file  Other Topics Concern  . Not on file  Social History Narrative   [facility] (11/18/16):   Originally from [place] but grew [place] in [place]. Cared for her husband until he passed. 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 Does have significant dust exposure. Has 3 dogs currently. Previously had a parakeet as a child. No mold exposure. Enjoys playing with her grandchildren.        Outpatient Encounter Medications as of 11/22/2018  Medication Sig  . ACCU-CHEK FASTCLIX LANCETS MISC USE TO check blood glucose twice daily  . acetaminophen (TYLENOL) 500 MG tablet Take 500 mg by mouth every 6 (six) hours as needed.  Marland Kitchen. albuterol (PROVENTIL) (2.5 MG/3ML) 0.083% nebulizer solution Use 1 vial (3 mL) via nebulizer every 6 hours as needed for wheezing or shortness of breath  . aspirin EC 81 MG tablet Take one po qd  . budesonide (PULMICORT) 0.5 MG/2ML nebulizer solution Take 2 mLs (0.5 mg total) 2 (two) times daily by nebulization.  . cyclobenzaprine (FLEXERIL) 10 MG tablet Take 1 tablet by mouth three  times daily as needed for muscle spasm  . escitalopram (LEXAPRO) 20 MG tablet Take 1 tablet by mouth once daily  . formoterol (PERFOROMIST) 20 MCG/2ML nebulizer solution Take 2 mLs (20 mcg total) by nebulization 2 (two) times daily.  Marland Kitchen. gabapentin (NEURONTIN) 300 MG capsule Take 1 capsule by mouth twice daily  . glimepiride (AMARYL) 2 MG tablet Take 1 tablet by mouth once daily with breakfast  . glucose blood (ACCU-CHEK GUIDE) test strip USE TO check blood glucose twice daily  . levothyroxine (SYNTHROID) 100 MCG tablet Take 1 tablet (100 mcg total) by mouth daily before breakfast.  . losartan (COZAAR) 25 MG tablet Take 1 tablet (25 mg total) by mouth daily.  . meloxicam (MOBIC) 15 MG tablet Take 1 tablet (15 mg total) by mouth daily.  . metFORMIN (GLUCOPHAGE) 1000 MG tablet Take 1 tablet (1,000 mg total) by mouth 2 (two) times daily with a meal.  . nitroGLYCERIN (NITROSTAT) 0.4 MG SL tablet Place 1 tablet (0.4 mg total) under the tongue every 5 (five) minutes as needed for chest pain.  . pravastatin (PRAVACHOL) 20 MG tablet Take 1 tablet by mouth once daily  . propranolol (INDERAL) 20 MG tablet Take 1 tablet (20 mg total) by mouth 2 (two) times daily.  . [DISCONTINUED] levothyroxine (SYNTHROID) 50 MCG tablet Take 1 tablet (50 mcg total) by mouth daily before breakfast.   No facility-administered encounter medications on file as of 11/22/2018.    ALLERGIES: Allergies  Allergen Reactions  . Dilaudid [Hydromorphone]     Itching and vomitting  . Hydromorphone   . Valproic Acid And Related    VACCINATION STATUS: Immunization History  Administered Date(s) Administered  . Influenza,inj,Quad PF,6+ Mos 04/27/2017  . Pneumococcal Polysaccharide-23 05/02/2005    HPI  Mrs. Sherry Glass is status post radioactive iodine thyroid ablation on June 29, 2018.  She developed hypothyroidism, and was started on low-dose levothyroxine 50 mcg during her last visit. Telephone visit, patient complains of  continued weight gain, and depression.   The patient denies family history of thyroid dysfunction- she is adopted. -She denies palpitations, tremors, nor heat intolerance. - She has well-controlled type 2 diabetes with A1c of 6.1% on January 30, 2018.  She remains on metformin and glimepiride .  - She reports that she has quit smoking after decades of  heavy smoking.     Review of Systems. Limited as above   Objective:    There were no vitals taken for this visit.  Wt Readings from Last 3 Encounters:  07/19/18 213 lb (96.6 kg)  07/02/18 210 lb (95.3 kg)  06/15/18 211 lb 9.6 oz (96 kg)    Physical Exam   Lipid Panel     Component Value Date/Time   CHOL 176 07/02/2018 1328   TRIG 267 (H) 07/02/2018 1328   HDL 46 07/02/2018 1328   CHOLHDL 3.8 07/02/2018 1328   LDLCALC 77 07/02/2018 1328   Recent Results (from the past 2160 hour(s))  T4, free     Status: Abnormal   Collection Time: 09/06/18  8:03 AM  Result Value Ref Range   Free T4 0.7 (L) 0.8 - 1.8 ng/dL  TSH     Status: Abnormal   Collection Time: 09/06/18  8:03 AM  Result Value Ref Range   TSH 0.04 (L) mIU/L    Comment:           Reference Range .           > or = 20 Years  0.40-4.50 .                Pregnancy Ranges           First trimester    0.26-2.66           Second trimester   0.55-2.73           Third trimester    0.43-2.91   T4, free     Status: None   Collection Time: 11/15/18 10:25 AM  Result Value Ref Range   Free T4 0.9 0.8 - 1.8 ng/dL  TSH     Status: Abnormal   Collection Time: 11/15/18 10:25 AM  Result Value Ref Range   TSH 15.03 (H) mIU/L    Comment:           Reference Range .           > or = 20 Years  0.40-4.50 .                Pregnancy Ranges           First trimester    0.26-2.66           Second trimester   0.55-2.73           Third trimester    0.43-2.91      - Thyroid uptake and scan on 07/19/2016 was nonfocal with 25% uptake and 24 hours.  March 05, 2018 thyroid  uptake and scan: FINDINGS: Normal thyroid scan.  No hot or cold nodules are identified.  4 hour I-131 uptake = 17.6% (normal 5-20%) 24 hour I-131 uptake = 30.0% (normal 10-30%)  IMPRESSION: 1. Normal thyroid scan. 2. Top-normal 24 hour iodine 131 uptake at 30%.  I-131 thyroid ablation on June 29, 2018.  Assessment & Plan:   1.  RAI induced hypothyroidism  2.  Type 2 diabetes -She is status post thyroid ablation with I-131 for significant T3 toxicosis.  Treatment was administered on June 29, 2018.   -Her previsit thyroid function tests confirm full effect of RAI therapy and inadequate replacement with her current dose of levothyroxine. -I discussed and increase her levothyroxine to 100 mcg p.o. every morning.   -  She will likely need a higher dose of thyroid hormone on subsequent visits.   - We discussed about the correct  intake of her thyroid hormone, on empty stomach at fasting, with water, separated by at least 30 minutes from breakfast and other medications,  and separated by more than 4 hours from calcium, iron, multivitamins, acid reflux medications (PPIs). -Patient is made aware of the fact that thyroid hormone replacement is needed for life, dose to be adjusted by periodic monitoring of thyroid function tests.   She has type 2 diabetes with recent A1c of 7.7%. She is advised to continue metformin 1000 mg p.o. twice daily and follow-up with her PCP.  - I advised patient to maintain close follow up with Sharion Balloon, Glass for primary care needs, including diabetes care.   Time for this visit: 15 minutes. Seward Meth  participated in the discussions, expressed understanding, and voiced agreement with the above plans.  All questions were answered to her satisfaction. she is encouraged to contact clinic should she have any questions or concerns prior to her return visit.   Follow up plan: Return in about 3 months (around 02/22/2019) for Follow up with Pre-visit  Labs.  Glade Lloyd, MD Phone: [phone]  Fax: [phone]   11/22/2018, 10:13 AM

## 2018-11-27 ENCOUNTER — Other Ambulatory Visit: Payer: Self-pay

## 2018-11-28 ENCOUNTER — Ambulatory Visit (INDEPENDENT_AMBULATORY_CARE_PROVIDER_SITE_OTHER): Payer: Medicaid Other | Admitting: Family

## 2018-11-28 ENCOUNTER — Encounter: Payer: Self-pay | Admitting: Family

## 2018-11-28 VITALS — BP 113/74 | HR 73 | Temp 97.6°F | Ht 64.0 in | Wt 229.6 lb

## 2018-11-28 DIAGNOSIS — F320 Major depressive disorder, single episode, mild: Secondary | ICD-10-CM | POA: Diagnosis not present

## 2018-11-28 DIAGNOSIS — J449 Chronic obstructive pulmonary disease, unspecified: Secondary | ICD-10-CM | POA: Diagnosis not present

## 2018-11-28 DIAGNOSIS — E1165 Type 2 diabetes mellitus with hyperglycemia: Secondary | ICD-10-CM | POA: Diagnosis not present

## 2018-11-28 DIAGNOSIS — I8393 Asymptomatic varicose veins of bilateral lower extremities: Secondary | ICD-10-CM

## 2018-11-28 DIAGNOSIS — J441 Chronic obstructive pulmonary disease with (acute) exacerbation: Secondary | ICD-10-CM | POA: Diagnosis not present

## 2018-11-28 MED ORDER — BUPROPION HCL ER (XL) 150 MG PO TB24: 150.0000 mg | ORAL_TABLET | Freq: Every day | ORAL | 1 refills | Status: DC

## 2018-11-28 MED ORDER — PREDNISONE 10 MG (21) PO TBPK: ORAL_TABLET | ORAL | 0 refills | Status: DC

## 2018-11-28 NOTE — Progress Notes (Addendum)
Subjective:    Patient ID: Sherry Glass, female    DOB: [birth year], 48 y.o.   MRN: [id number]  Chief Complaint  Patient presents with  . knots on legs with soreness at times   PT presents to the office today with "knots" on bilateral legs that will appear and disappear . She states she noticed it about a month ago. She reports intermittent aching pain of 4 out 10.   Denies any injury, edema.  She is wheezing a great deal today. She is followed by Pulmonologist and is scheduled for CT next month. Uses oxygen at nights and on Stiolto daily. She has been diagnosed with COPD and does not smoke.  Wheezing  This is a chronic problem. The current episode started more than 1 year ago. The problem occurs intermittently. The problem has been waxing and waning. Associated symptoms include shortness of breath. Pertinent negatives include no chills, diarrhea, ear pain, fever, headaches or sputum production.  Depression        This is a chronic problem.  The current episode started more than 1 year ago.   The onset quality is gradual.   The problem occurs intermittently.  Associated symptoms include fatigue, helplessness, hopelessness, insomnia, irritable, restlessness, decreased interest and sad.  Associated symptoms include no headaches.  Past treatments include SSRIs - Selective serotonin reuptake inhibitors.      Review of Systems  Constitutional: Positive for fatigue. Negative for chills and fever.  HENT: Negative for ear pain.   Respiratory: Positive for shortness of breath and wheezing. Negative for sputum production.   Gastrointestinal: Negative for diarrhea.  Neurological: Negative for headaches.  Psychiatric/Behavioral: Positive for depression. The patient has insomnia.   All other systems reviewed and are negative.      Objective:   Physical Exam Vitals signs reviewed.  Constitutional:      General: She is irritable. She is not in acute distress.    Appearance: She is  well-developed. She is obese.  HENT:     Head: Normocephalic and atraumatic.  Eyes:     Pupils: Pupils are equal, round, and reactive to light.  Neck:     Musculoskeletal: Normal range of motion and neck supple.     Thyroid: No thyromegaly.  Cardiovascular:     Rate and Rhythm: Normal rate and regular rhythm.     Heart sounds: Normal heart sounds. No murmur.  Pulmonary:     Effort: Pulmonary effort is normal. No respiratory distress.     Breath sounds: Wheezing present.  Abdominal:     General: Bowel sounds are normal. There is no distension.     Palpations: Abdomen is soft.     Tenderness: There is no abdominal tenderness.  Musculoskeletal: Normal range of motion.        General: No tenderness.  Skin:    General: Skin is warm and dry.     Comments: varicose veins present in bilateral legs, no swelling, warmth, or erythemas present  Neurological:     Mental Status: She is alert and oriented to person, place, and time.     Cranial Nerves: No cranial nerve deficit.     Deep Tendon Reflexes: Reflexes are normal and symmetric.  Psychiatric:        Behavior: Behavior normal.        Thought Content: Thought content normal.        Judgment: Judgment normal.       BP 113/74   Pulse 73   Temp  97.6 F (36.4 C) (Oral)   Ht 5\' 4"  (1.626 m)   Wt 229 lb 9.6 oz (104.1 kg)   BMI 39.41 kg/m      Assessment & Plan:  [name] comes in today with chief complaint of knots on legs with soreness at times   Diagnosis and orders addressed:  1. Chronic obstructive pulmonary disease, unspecified COPD type (HCC) Continue inhalers  Keep Pulmonologist appt  - predniSONE (STERAPRED UNI-PAK 21 TAB) 10 MG (21) TBPK tablet; Use as directed  Dispense: 21 tablet; Refill: 0  2. Morbid obesity (HCC)  3. Depression, major, single episode, mild (HCC) Will add Wellbutrin  Continue Lexapro  - buPROPion (WELLBUTRIN XL) 150 MG 24 hr tablet; Take 1 tablet (150 mg total) by mouth daily.   Dispense: 90 tablet; Refill: 1  4. Uncontrolled type 2 diabetes mellitus with hyperglycemia (HCC) -Strict low carb diet since giving  Prednisone    5. Varicose veins of both lower extremities, unspecified whether complicated  6. COPD exacerbation (HCC) - predniSONE (STERAPRED UNI-PAK 21 TAB) 10 MG (21) TBPK tablet; Use as directed  Dispense: 21 tablet; Refill: 0   Follow up plan: 4 weeks   Jannifer Rodneyhristy Hawks, [name]

## 2018-11-28 NOTE — Patient Instructions (Signed)
Varicose Veins Varicose veins are veins that have become enlarged, bulged, and twisted. They most often appear in the legs. What are the causes? This condition is caused by damage to the valves in the vein. These valves help blood return to your heart. When they are damaged and they stop working properly, blood may flow backward and back up in the veins near the skin, causing the veins to get larger and appear twisted. The condition can result from any issue that causes blood to back up, like pregnancy, prolonged standing, or obesity. What increases the risk? This condition is more likely to develop in people who are:  On their feet a lot.  Pregnant.  Overweight. What are the signs or symptoms? Symptoms of this condition include:  Bulging, twisted, and bluish veins.  A feeling of heaviness. This may be worse at the end of the day.  Leg pain. This may be worse at the end of the day.  Swelling in the leg.  Changes in skin color over the veins. How is this diagnosed? This condition may be diagnosed based on your symptoms, a physical exam, and an ultrasound test. How is this treated? Treatment for this condition may involve:  Avoiding sitting or standing in one position for long periods of time.  Wearing compression stockings. These stockings help to prevent blood clots and reduce swelling in the legs.  Raising (elevating) the legs when resting.  Losing weight.  Exercising regularly. If you have persistent symptoms or want to improve the way your varicose veins look, you may choose to have a procedure to close the varicose veins off or to remove them. Treatments to close off the veins include:  Sclerotherapy. In this treatment, a solution is injected into a vein to close it off.  Laser treatment. In this treatment, the vein is heated with a laser to close it off.  Radiofrequency vein ablation. In this treatment, an electrical current produced by radio waves is used to close  off the vein. Treatments to remove the veins include:  Phlebectomy. In this treatment, the veins are removed through small incisions made over the veins.  Vein ligation and stripping. In this treatment, incisions are made over the veins. The veins are then removed after being tied (ligated) with stitches (sutures). Follow these instructions at home: Activity  Walk as much as possible. Walking increases blood flow. This helps blood return to the heart and takes pressure off your veins. It also increases your cardiovascular strength.  Follow your health care provider's instructions about exercising.  Do not stand or sit in one position for a long period of time.  Do not sit with your legs crossed.  Rest with your legs raised during the day. General instructions   Follow any diet instructions given to you by your health care provider.  Wear compression stockings as directed by your health care provider. Do not wear other kinds of tight clothing around your legs, pelvis, or waist.  Elevate your legs at night to above the level of your heart.  If you get a cut in the skin over the varicose vein and the vein bleeds: ? Lie down with your leg raised. ? Apply firm pressure to the cut with a clean cloth until the bleeding stops. ? Place a bandage (dressing) on the cut. Contact a health care provider if:  The skin around your varicose veins starts to break down.  You have pain, redness, tenderness, or hard swelling over a vein.  You   are uncomfortable because of pain.  You get a cut in the skin over a varicose vein and it will not stop bleeding. Summary  Varicose veins are veins that have become enlarged, bulged, and twisted. They most often appear in the legs.  This condition is caused by damage to the valves in the vein. These valves help blood return to your heart.  Treatment for this condition includes frequent movements, wearing compression stockings, losing weight, and  exercising regularly. In some cases, procedures are done to close off or remove the veins.  Treatment for this condition may include wearing compression stockings, elevating the legs, losing weight, and engaging in regular activity. In some cases, procedures are done to close off or remove the veins. This information is not intended to replace advice given to you by your health care provider. Make sure you discuss any questions you have with your health care provider. Document Released: 01/26/2005 Document Revised: 06/14/2018 Document Reviewed: 05/11/2016 Elsevier Patient Education  2020 Elsevier Inc.  

## 2018-12-27 ENCOUNTER — Other Ambulatory Visit: Payer: Self-pay | Admitting: Family

## 2019-01-01 DIAGNOSIS — J449 Chronic obstructive pulmonary disease, unspecified: Secondary | ICD-10-CM | POA: Diagnosis not present

## 2019-01-02 NOTE — Telephone Encounter (Signed)
Received email from patient "Hi I was wondering if you ever got to make me a ct appointment?"    Upon looking through the chart, I see last OV with Sherry Barrow, NP it was anticipated that CT would be in July 2020.  I see an order placed for CT on 12/05/2017.  [facility], do you need [place] to place a new order since previous order is over a year old?   Thank you for your help!

## 2019-01-03 ENCOUNTER — Other Ambulatory Visit: Payer: Self-pay | Admitting: Emergency Medicine

## 2019-01-03 DIAGNOSIS — R918 Other nonspecific abnormal finding of lung field: Secondary | ICD-10-CM

## 2019-01-03 NOTE — Telephone Encounter (Signed)
New order for the expired CT placed. Thank you!

## 2019-01-10 ENCOUNTER — Other Ambulatory Visit: Payer: Self-pay

## 2019-01-11 ENCOUNTER — Ambulatory Visit (HOSPITAL_COMMUNITY): Payer: Medicaid Other

## 2019-01-11 ENCOUNTER — Ambulatory Visit: Payer: Medicaid Other | Admitting: Family

## 2019-01-11 ENCOUNTER — Encounter: Payer: Self-pay | Admitting: Family

## 2019-01-11 VITALS — BP 130/82 | HR 71 | Temp 97.7°F | Ht 64.0 in | Wt 225.0 lb

## 2019-01-11 DIAGNOSIS — F320 Major depressive disorder, single episode, mild: Secondary | ICD-10-CM | POA: Diagnosis not present

## 2019-01-11 DIAGNOSIS — L409 Psoriasis, unspecified: Secondary | ICD-10-CM

## 2019-01-11 DIAGNOSIS — J449 Chronic obstructive pulmonary disease, unspecified: Secondary | ICD-10-CM | POA: Diagnosis not present

## 2019-01-11 DIAGNOSIS — F411 Generalized anxiety disorder: Secondary | ICD-10-CM

## 2019-01-11 DIAGNOSIS — Z1211 Encounter for screening for malignant neoplasm of colon: Secondary | ICD-10-CM

## 2019-01-11 MED ORDER — BUSPIRONE HCL 7.5 MG PO TABS: 7.5000 mg | ORAL_TABLET | Freq: Three times a day (TID) | ORAL | 1 refills | Status: DC

## 2019-01-11 MED ORDER — DULOXETINE HCL 60 MG PO CPEP: 60.0000 mg | ORAL_CAPSULE | Freq: Every day | ORAL | 1 refills | Status: DC

## 2019-01-11 NOTE — Progress Notes (Signed)
Subjective:    Patient ID: Sherry Glass, female    DOB: 1970/12/10, 48 y.o.   MRN: [id number]  Chief Complaint  Patient presents with  . Medical Management of Chronic Issues   Pt presents to the office today to recheck Depression, GAD, and COPD. She was seen on 11/28/18 and we added Wellbutrin 150 mg with her Lexapro 20 mg. She states she has not been able to tell a difference.   She was also was given prednisone for her COPD. She has a follow up appointment with her Pulmonologist .    Depression        This is a chronic problem.  The current episode started more than 1 year ago.   The onset quality is gradual.   The problem occurs intermittently.  The problem has been waxing and waning since onset.  Associated symptoms include decreased concentration, helplessness, hopelessness, irritable, restlessness, decreased interest and sad.  Past treatments include SSRIs - Selective serotonin reuptake inhibitors.  Past medical history includes anxiety.   Anxiety Presents for follow-up visit. Symptoms include decreased concentration, depressed mood, excessive worry, irritability, nervous/anxious behavior, panic and restlessness. Symptoms occur most days. The severity of symptoms is moderate. The quality of sleep is good.        Review of Systems  Constitutional: Positive for irritability.  Psychiatric/Behavioral: Positive for decreased concentration and depression. The patient is nervous/anxious.   All other systems reviewed and are negative.      Objective:   Physical Exam Vitals signs reviewed.  Constitutional:      General: She is irritable. She is not in acute distress.    Appearance: She is well-developed.  HENT:     Head: Normocephalic and atraumatic.     Right Ear: Tympanic membrane normal.     Left Ear: Tympanic membrane normal.  Eyes:     Pupils: Pupils are equal, round, and reactive to light.  Neck:     Musculoskeletal: Normal range of motion and neck supple.     Thyroid:  No thyromegaly.  Cardiovascular:     Rate and Rhythm: Normal rate and regular rhythm.     Heart sounds: Normal heart sounds. No murmur.  Pulmonary:     Effort: Pulmonary effort is normal. No respiratory distress.     Breath sounds: Normal breath sounds. No wheezing.  Abdominal:     General: Bowel sounds are normal. There is no distension.     Palpations: Abdomen is soft.     Tenderness: There is no abdominal tenderness.  Musculoskeletal: Normal range of motion.        General: No tenderness.  Skin:    General: Skin is warm and dry.     Findings: Rash present.     Comments: Plaque like rash on bilateral elbows and right knee  Neurological:     Mental Status: She is alert and oriented to person, place, and time.     Cranial Nerves: No cranial nerve deficit.     Deep Tendon Reflexes: Reflexes are normal and symmetric.  Psychiatric:        Behavior: Behavior normal.        Thought Content: Thought content normal.        Judgment: Judgment normal.       BP 130/82   Pulse 71   Temp 97.7 F (36.5 C) (Oral)   Ht 5\' 4"  (1.626 m)   Wt 225 lb (102.1 kg)   BMI 38.62 kg/m  Assessment & Plan:  [name] comes in today with chief complaint of Medical Management of Chronic Issues   Diagnosis and orders addressed:  1. Depression, major, single episode, mild ([place]) Will continue lexapro for the next week, then decrease to 10 mg and take for 2 weeks. She will start Cymbalta today.  She will stop Wellbutrin and start Buspar 7.5 mg TID prn Stress management  RTO in 6 weeks - DULoxetine (CYMBALTA) 60 MG capsule; Take 1 capsule (60 mg total) by mouth daily.  Dispense: 90 capsule; Refill: 1 - busPIRone (BUSPAR) 7.5 MG tablet; Take 1 tablet (7.5 mg total) by mouth 3 (three) times daily.  Dispense: 90 tablet; Refill: 1  2. Morbid obesity ([place])  3. Chronic obstructive pulmonary disease, unspecified COPD type ([place]) Keep Pulmonologist  referral   4. GAD (generalized anxiety  disorder) - DULoxetine (CYMBALTA) 60 MG capsule; Take 1 capsule (60 mg total) by mouth daily.  Dispense: 90 capsule; Refill: 1 - busPIRone (BUSPAR) 7.5 MG tablet; Take 1 tablet (7.5 mg total) by mouth 3 (three) times daily.  Dispense: 90 tablet; Refill: 1  5. Psoriasis - Ambulatory referral to Dermatology  6. Colon cancer screening - Ambulatory referral to Gastroenterology   Follow up plan: 6 weeks to recheck GAD and Depression   Evelina Dun, [name]

## 2019-01-11 NOTE — Patient Instructions (Signed)
Persistent Depressive Disorder  Persistent depressive disorder (PDD) is a mental health condition. PDD causes symptoms of low-level depression for 2 years or longer. It may also be called long-term (chronic) depression or dysthymia. PDD may include episodes of more severe depression that last for about 2 weeks (major depressive disorder or MDD). PDD can affect the way you think, feel, and sleep. This condition may also affect your relationships. You may be more likely to get sick if you have PDD. Symptoms of PDD occur for most of the day and may include:  Feeling tired (fatigue).  Low energy.  Eating too much or too little.  Sleeping too much or too little.  Feeling restless or agitated.  Feeling hopeless.  Feeling worthless or guilty.  Feeling worried or nervous (anxiety).  Trouble concentrating or making decisions.  Low self-esteem.  A negative way of looking at things ([place]).  Not being able to have fun or feel pleasure.  Avoiding interacting with people.  Getting angry or annoyed easily (irritability).  Acting aggressive or angry. Follow these instructions at home: Activity  Go back to your normal activities as told by your doctor.  Exercise regularly as told by your doctor. General instructions  Take over-the-counter and prescription medicines only as told by your doctor.  Do not drink alcohol. Or, limit how much alcohol you drink to no more than 1 drink a day for nonpregnant women and 2 drinks a day for men. One drink equals 12 oz of beer, 5 oz of wine, or 1 oz of hard liquor. Alcohol can affect any antidepressant medicines you are taking. Talk with your doctor about your alcohol use.  Eat a healthy diet and get plenty of sleep.  Find activities that you enjoy each day.  Consider joining a support group. Your doctor may be able to suggest a support group.  Keep all follow-up visits as told by your doctor. This is important. Where to find more  information National Alliance on Mental Illness  www.nami.org [place] National Institute of Mental Health  www.nimh.nih.gov National Suicide Prevention Lifeline  (1-800-273-8255).  This is free, 24-hour help. Contact a doctor if:  Your symptoms get worse.  You have new symptoms.  You have trouble sleeping or doing your daily activities. Get help right away if:  You self-harm.  You have serious thoughts about hurting yourself or others.  You see, hear, taste, smell, or feel things that are not there (hallucinate). This information is not intended to replace advice given to you by your health care provider. Make sure you discuss any questions you have with your health care provider. Document Released: 03/30/2015 Document Revised: 03/31/2017 Document Reviewed: 12/11/2015 Elsevier Patient Education  2020 Elsevier Inc.  

## 2019-01-15 ENCOUNTER — Encounter: Payer: Self-pay | Admitting: Gastroenterology

## 2019-01-31 DIAGNOSIS — J449 Chronic obstructive pulmonary disease, unspecified: Secondary | ICD-10-CM | POA: Diagnosis not present

## 2019-02-01 ENCOUNTER — Ambulatory Visit (HOSPITAL_COMMUNITY): Payer: Medicaid Other

## 2019-02-04 ENCOUNTER — Other Ambulatory Visit: Payer: Self-pay | Admitting: Family

## 2019-02-22 ENCOUNTER — Ambulatory Visit: Payer: Medicaid Other | Admitting: Family

## 2019-02-22 ENCOUNTER — Ambulatory Visit: Payer: Medicaid Other | Admitting: "Endocrinology

## 2019-02-28 ENCOUNTER — Other Ambulatory Visit: Payer: Self-pay

## 2019-02-28 ENCOUNTER — Encounter: Payer: Self-pay | Admitting: Gastroenterology

## 2019-02-28 ENCOUNTER — Ambulatory Visit: Payer: Medicaid Other | Admitting: Gastroenterology

## 2019-02-28 DIAGNOSIS — Z1211 Encounter for screening for malignant neoplasm of colon: Secondary | ICD-10-CM | POA: Diagnosis not present

## 2019-02-28 MED ORDER — CLENPIQ 10-3.5-12 MG-GM -GM/160ML PO SOLN: 1.0000 | Freq: Once | ORAL | 0 refills | Status: AC

## 2019-02-28 NOTE — Patient Instructions (Addendum)
We have arranged a colonoscopy in the near future with Dr. Gala Romney.  Do not take your oral diabetes medications the day of the procedure (glimepiride and metformin).  Further recommendations to follow!  It was a pleasure to see you today. I want to create trusting relationships with patients to provide genuine, compassionate, and quality care. I value your feedback. If you receive a survey regarding your visit,  I greatly appreciate you taking time to fill this out.   Annitta Needs, PhD, ANP-BC [facility]

## 2019-02-28 NOTE — Progress Notes (Signed)
CC'ED TO PCP 

## 2019-02-28 NOTE — Progress Notes (Signed)
Primary Care Physician:  [name], [name], [name] Primary Gastroenterologist:  Dr. Jena Gaussourk   Chief Complaint  Patient presents with  . Consult    never had TCS prior. unsure about family history    HPI:   Sherry Glass is a 48 y.o. female presenting today at the request of Jannifer Rodneyhristy Hawks, [name], to arrange an initial screening colonoscopy. Family history of colorectal cancer and polyps unknown.   No rectal bleeding. Some gas this week but no abdominal pain. Burping all week. No dysphagia. Every now and then indigestion. No PPI. Doesn't take anything routinely. No unexplained weight loss or lack of appetite. Loose stool for about a year, alternating between soft and loose. Between 5-6 a day. Prior to this, BM would be about twice per day. Started on metformin about 3 years ago. Postprandial. Sometimes cramping and urgency, sometimes not. Glass of milk a day. Not a lot of cheese. Doesn't want prescriptive agents. Cholecystectomy in remote past.   Past Medical History:  Diagnosis Date  . Allergic rhinitis   . Anxiety and depression   . Cataract   . Chest pain syndrome   . COPD (chronic obstructive pulmonary disease) (HCC)   . Depression   . Diabetes mellitus without complication (HCC)   . History of kidney stones   . HTN (hypertension)   . Hypercholesterolemia   . Hyperglycemia   . Hypertension   . Obesity   . Restless leg syndrome   . Tobacco abuse     Past Surgical History:  Procedure Laterality Date  . ABDOMINAL HYSTERECTOMY    . CARDIAC CATHETERIZATION  2014  . CATARACT EXTRACTION W/PHACO Left 06/23/2016   Procedure: CATARACT EXTRACTION PHACO AND INTRAOCULAR LENS PLACEMENT (IOC);  Surgeon: Gemma PayorKerry Hunt, MD;  Location: [facility];  Service: Ophthalmology;  Laterality: Left;  left - pt knows to arrive at 10:50 cde 9.11  . CESAREAN SECTION    . CHOLECYSTECTOMY    . EXCISION MASS NECK     cyst  . FOOT MASS EXCISION    . left foot surgery     removal of cyst  . NECK SURGERY    .  TONSILLECTOMY      Current Outpatient Medications  Medication Sig Dispense Refill  . ACCU-CHEK FASTCLIX LANCETS MISC USE TO check blood glucose twice daily 102 each 2  . acetaminophen (TYLENOL) 500 MG tablet Take 500 mg by mouth every 6 (six) hours as needed.    Marland Kitchen. albuterol (PROVENTIL) (2.5 MG/3ML) 0.083% nebulizer solution Use 1 vial (3 mL) via nebulizer every 6 hours as needed for wheezing or shortness of breath 150 mL 5  . aspirin EC 81 MG tablet Take one po qd 90 tablet 1  . budesonide (PULMICORT) 0.5 MG/2ML nebulizer solution Take 2 mLs (0.5 mg total) 2 (two) times daily by nebulization. 120 mL 3  . busPIRone (BUSPAR) 7.5 MG tablet Take 1 tablet (7.5 mg total) by mouth 3 (three) times daily. 90 tablet 1  . cyclobenzaprine (FLEXERIL) 10 MG tablet Take 1 tablet by mouth three times daily as needed for muscle spasm 90 tablet 0  . DULoxetine (CYMBALTA) 60 MG capsule Take 1 capsule (60 mg total) by mouth daily. 90 capsule 1  . formoterol (PERFOROMIST) 20 MCG/2ML nebulizer solution Take 2 mLs (20 mcg total) by nebulization 2 (two) times daily. 120 mL 6  . gabapentin (NEURONTIN) 300 MG capsule Take 1 capsule by mouth twice daily 180 capsule 0  . glimepiride (AMARYL) 2 MG tablet  Take 1 tablet by mouth once daily with breakfast 90 tablet 1  . glucose blood (ACCU-CHEK GUIDE) test strip USE TO check blood glucose twice daily 100 each 2  . levothyroxine (SYNTHROID) 100 MCG tablet Take 1 tablet (100 mcg total) by mouth daily before breakfast. 30 tablet 3  . losartan (COZAAR) 25 MG tablet Take 1 tablet by mouth once daily 90 tablet 1  . metFORMIN (GLUCOPHAGE) 1000 MG tablet TAKE 1 TABLET BY MOUTH TWICE DAILY WITH MEALS 180 tablet 1  . nitroGLYCERIN (NITROSTAT) 0.4 MG SL tablet Place 1 tablet (0.4 mg total) under the tongue every 5 (five) minutes as needed for chest pain. 25 tablet 3  . pravastatin (PRAVACHOL) 20 MG tablet Take 1 tablet by mouth once daily 90 tablet 1  . propranolol (INDERAL) 20 MG  tablet Take 1 tablet (20 mg total) by mouth 2 (two) times daily. 60 tablet 1  . STIOLTO RESPIMAT 2.5-2.5 MCG/ACT AERS INHALE 2 PUFFS BY MOUTH ONCE DAILY     No current facility-administered medications for this visit.     Allergies as of 02/28/2019 - Review Complete 02/28/2019  Allergen Reaction Noted  . Dilaudid [hydromorphone]  05/13/2016  . Hydromorphone  07/03/2012  . Valproic acid and related  07/03/2012    Family History  Adopted: Yes  Family history unknown: Yes    Social History   Socioeconomic History  . Marital status: Widowed    Spouse name: Not on file  . Number of children: Not on file  . Years of education: Not on file  . Highest education level: Not on file  Occupational History  . Not on file  Social Needs  . Financial resource strain: Not on file  . Food insecurity    Worry: Not on file    Inability: Not on file  . Transportation needs    Medical: Not on file    Non-medical: Not on file  Tobacco Use  . Smoking status: Former Smoker    Packs/day: 1.00    Years: 25.00    Pack years: 25.00    Types: Cigarettes    Start date: 11/13/1988    Quit date: 10/28/2016    Years since quitting: 2.3  . Smokeless tobacco: Never Used  Substance and Sexual Activity  . Alcohol use: Yes    Comment: For special occasions, very rare   . Drug use: No  . Sexual activity: Never    Birth control/protection: Surgical  Lifestyle  . Physical activity    Days per week: Not on file    Minutes per session: Not on file  . Stress: Not on file  Relationships  . Social Musician on phone: Not on file    Gets together: Not on file    Attends religious service: Not on file    Active member of club or organization: Not on file    Attends meetings of clubs or organizations: Not on file    Relationship status: Not on file  . Intimate partner violence    Fear of current or ex partner: Not on file    Emotionally abused: Not on file    Physically abused: Not on file     Forced sexual activity: Not on file  Other Topics Concern  . Not on file  Social History Narrative   [facility] (11/18/16):   Originally from [place] but grew up in [place]. Cared for her husband until he passed. She previously worked in Designer, fashion/clothing in lifting,  manufacturing, sewing, cutting cloth, etc. Does have significant dust exposure. Has 3 dogs currently. Previously had a parakeet as a child. No mold exposure. Enjoys playing with her grandchildren.         Review of Systems: Gen: Denies any fever, chills, fatigue, weight loss, lack of appetite.  CV: Denies chest pain, heart palpitations, peripheral edema, syncope.  Resp: Denies shortness of breath at rest or with exertion. Denies wheezing or cough.  GI: see HPI GU : Denies urinary burning, urinary frequency, urinary hesitancy MS: Denies joint pain, muscle weakness, cramps, or limitation of movement.  Derm: Denies rash, itching, dry skin Psych: Denies depression, anxiety, memory loss, and confusion Heme: see HPI  Physical Exam: BP (!) 167/83   Pulse 93   Temp (!) 97.1 F (36.2 C) (Oral)   Ht 5\' 3"  (1.6 m)   Wt 225 lb (102.1 kg)   BMI 39.86 kg/m  General:   Alert and oriented. Pleasant and cooperative. Well-nourished and well-developed.  Head:  Normocephalic and atraumatic. Eyes:  Without icterus, sclera clear and conjunctiva pink.  Ears:  Normal auditory acuity. Nose:  No deformity, discharge,  or lesions. Lungs:  Clear to auscultation bilaterally.  Heart:  S1, S2 present without murmurs appreciated.  Abdomen:  +BS, soft, obese, non-tender and non-distended. No HSM noted. No guarding or rebound. No masses appreciated.  Rectal:  Deferred  Msk:  Symmetrical without gross deformities. Normal posture. Extremities:  Without edema. Neurologic:  Alert and  oriented x4 Psych:  Alert and cooperative. Normal mood and affect.  ASSESSMENT: Pleasant 48 year old female referred for initial screening colonoscopy, family history of  colorectal cancer and polyps unknown. 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 Declining any supportive agents at this time. No rectal bleeding, weight loss, loss of appetite. Will need to pursue colonoscopy with Propofol due to polypharmacy.   PLAN: 1. Proceed with TCS with Dr. Gala Romney in near future: the risks, benefits, and alternatives have been discussed with the patient in detail. The patient states understanding and desires to proceed. Propofol due to polypharmacy.  2. Hold oral diabetes medications day of procedure  Annitta Needs, PhD, ANP-BC [facility]

## 2019-03-01 ENCOUNTER — Ambulatory Visit: Payer: Medicaid Other | Admitting: "Endocrinology

## 2019-03-01 DIAGNOSIS — E890 Postprocedural hypothyroidism: Secondary | ICD-10-CM | POA: Diagnosis not present

## 2019-03-02 LAB — TSH: TSH: 2.67 mIU/L

## 2019-03-02 LAB — T4, FREE: Free T4: 1.3 ng/dL (ref 0.8–1.8)

## 2019-03-03 DIAGNOSIS — J449 Chronic obstructive pulmonary disease, unspecified: Secondary | ICD-10-CM | POA: Diagnosis not present

## 2019-03-11 ENCOUNTER — Ambulatory Visit (INDEPENDENT_AMBULATORY_CARE_PROVIDER_SITE_OTHER): Payer: Medicaid Other | Admitting: "Endocrinology

## 2019-03-11 ENCOUNTER — Other Ambulatory Visit: Payer: Self-pay

## 2019-03-11 ENCOUNTER — Encounter: Payer: Self-pay | Admitting: "Endocrinology

## 2019-03-11 VITALS — BP 117/80 | HR 80 | Ht 63.0 in | Wt 225.0 lb

## 2019-03-11 DIAGNOSIS — E890 Postprocedural hypothyroidism: Secondary | ICD-10-CM

## 2019-03-11 MED ORDER — SYNTHROID 112 MCG PO TABS: 112.0000 ug | ORAL_TABLET | Freq: Every day | ORAL | 6 refills | Status: DC

## 2019-03-11 NOTE — Progress Notes (Signed)
03/11/2019   Endocrinology follow-up note   Subjective:    Patient ID: Sherry Glass, female    DOB: 1971/02/11, PCP Sharion Balloon, [name].   Past Medical History:  Diagnosis Date  . Allergic rhinitis   . Anxiety and depression   . Cataract   . Chest pain syndrome   . COPD (chronic obstructive pulmonary disease) ([place])   . Depression   . Diabetes mellitus without complication ([place])   . History of kidney stones   . HTN (hypertension)   . Hypercholesterolemia   . Hyperglycemia   . Hypertension   . Obesity   . Restless leg syndrome   . Tobacco abuse    Past Surgical History:  Procedure Laterality Date  . ABDOMINAL HYSTERECTOMY    . CARDIAC CATHETERIZATION  2014  . CATARACT EXTRACTION W/PHACO Left 06/23/2016   Procedure: CATARACT EXTRACTION PHACO AND INTRAOCULAR LENS PLACEMENT (IOC);  Surgeon: Tonny Branch, MD;  Location: [facility];  Service: Ophthalmology;  Laterality: Left;  left - pt knows to arrive at 10:50 cde 9.11  . CESAREAN SECTION    . CHOLECYSTECTOMY    . EXCISION MASS NECK     cyst  . FOOT MASS EXCISION    . left foot surgery     removal of cyst  . NECK SURGERY    . TONSILLECTOMY     Social History   Socioeconomic History  . Marital status: Widowed    Spouse name: Not on file  . Number of children: Not on file  . Years of education: Not on file  . Highest education level: Not on file  Occupational History  . Not on file  Social Needs  . Financial resource strain: Not on file  . Food insecurity    Worry: Not on file    Inability: Not on file  . Transportation needs    Medical: Not on file    Non-medical: Not on file  Tobacco Use  . Smoking status: Former Smoker    Packs/day: 1.00    Years: 25.00    Pack years: 25.00    Types: Cigarettes    Start date: 11/13/1988    Quit date: 10/28/2016    Years since quitting: 2.3  . Smokeless tobacco: Never Used  Substance and Sexual Activity  . Alcohol use: Yes    Comment: For special occasions, very rare    . Drug use: No  . Sexual activity: Never    Birth control/protection: Surgical  Lifestyle  . Physical activity    Days per week: Not on file    Minutes per session: Not on file  . Stress: Not on file  Relationships  . Social Herbalist on phone: Not on file    Gets together: Not on file    Attends religious service: Not on file    Active member of club or organization: Not on file    Attends meetings of clubs or organizations: Not on file    Relationship status: Not on file  Other Topics Concern  . Not on file  Social History Narrative   [facility] (11/18/16):   Originally from [place] but grew up in [place]. Cared for her husband until he passed. 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 Does have significant dust exposure. Has 3 dogs currently. Previously had a parakeet as a child. No mold exposure. Enjoys playing with her grandchildren.        Outpatient Encounter  Medications as of 03/11/2019  Medication Sig  . ACCU-CHEK FASTCLIX LANCETS MISC USE TO check blood glucose twice daily  . acetaminophen (TYLENOL) 500 MG tablet Take 500 mg by mouth every 6 (six) hours as needed.  Marland Kitchen albuterol (PROVENTIL) (2.5 MG/3ML) 0.083% nebulizer solution Use 1 vial (3 mL) via nebulizer every 6 hours as needed for wheezing or shortness of breath  . aspirin EC 81 MG tablet Take one po qd  . budesonide (PULMICORT) 0.5 MG/2ML nebulizer solution Take 2 mLs (0.5 mg total) 2 (two) times daily by nebulization.  . busPIRone (BUSPAR) 7.5 MG tablet Take 1 tablet (7.5 mg total) by mouth 3 (three) times daily.  . cyclobenzaprine (FLEXERIL) 10 MG tablet Take 1 tablet by mouth three times daily as needed for muscle spasm  . DULoxetine (CYMBALTA) 60 MG capsule Take 1 capsule (60 mg total) by mouth daily.  . formoterol (PERFOROMIST) 20 MCG/2ML nebulizer solution Take 2 mLs (20 mcg total) by nebulization 2 (two) times daily.  Marland Kitchen gabapentin (NEURONTIN) 300 MG  capsule Take 1 capsule by mouth twice daily  . glimepiride (AMARYL) 2 MG tablet Take 1 tablet by mouth once daily with breakfast  . glucose blood (ACCU-CHEK GUIDE) test strip USE TO check blood glucose twice daily  . losartan (COZAAR) 25 MG tablet Take 1 tablet by mouth once daily  . metFORMIN (GLUCOPHAGE) 1000 MG tablet TAKE 1 TABLET BY MOUTH TWICE DAILY WITH MEALS  . nitroGLYCERIN (NITROSTAT) 0.4 MG SL tablet Place 1 tablet (0.4 mg total) under the tongue every 5 (five) minutes as needed for chest pain.  . pravastatin (PRAVACHOL) 20 MG tablet Take 1 tablet by mouth once daily  . propranolol (INDERAL) 20 MG tablet Take 1 tablet (20 mg total) by mouth 2 (two) times daily.  Marland Kitchen STIOLTO RESPIMAT 2.5-2.5 MCG/ACT AERS INHALE 2 PUFFS BY MOUTH ONCE DAILY  . SYNTHROID 112 MCG tablet Take 1 tablet (112 mcg total) by mouth daily before breakfast.  . [DISCONTINUED] levothyroxine (SYNTHROID) 100 MCG tablet Take 1 tablet (100 mcg total) by mouth daily before breakfast.   No facility-administered encounter medications on file as of 03/11/2019.    ALLERGIES: Allergies  Allergen Reactions  . Dilaudid [Hydromorphone]     Itching and vomitting  . Hydromorphone   . Valproic Acid And Related    VACCINATION STATUS: Immunization History  Administered Date(s) Administered  . Influenza,inj,Quad PF,6+ Mos 04/27/2017  . Pneumococcal Polysaccharide-23 05/02/2005    HPI  Sherry Glass is status post radioactive iodine thyroid ablation on June 29, 2018.  She developed hypothyroidism, and was started on low-dose levothyroxine , currently 100 mcg p.o. daily before breakfast.  She reports compliance to medication, however complains of rash affecting her torso, face and upper extremities.    The patient denies family history of thyroid dysfunction- she is adopted. -She denies palpitations, tremors, nor heat intolerance. - She has well-controlled type 2 diabetes with A1c of 6.1% on January 30, 2018.  She remains on  metformin and glimepiride .  - She reports that she has quit smoking after decades of heavy smoking.     Review of Systems. Limited as above   Objective:    BP 117/80   Pulse 80   Ht  (1.6 m)   Wt 225 lb (102.1 kg)   BMI 39.86 kg/m   Wt Readings from Last 3 Encounters:  03/11/19 225 lb (102.1 kg)  02/28/19 225 lb (102.1 kg)  01/11/19 225 lb (102.1 kg)  Physical Exam  Physical Exam- Limited  Constitutional:  Body mass index is 39.86 kg/m. , not in acute distress, normal state of mind Eyes:  EOMI, no exophthalmos Neck: Supple Respiratory: Adequate breathing efforts Musculoskeletal: no gross deformities, strength intact in all four extremities, no gross restriction of joint movements Skin:  + Maculopapular rash on extensor surface of her upper extremities, + hyperemic c plaques/ rashes over face. Neurological: no tremor with outstretched hands.   Lipid Panel     Component Value Date/Time   CHOL 176 07/02/2018 1328   TRIG 267 (H) 07/02/2018 1328   HDL 46 07/02/2018 1328   CHOLHDL 3.8 07/02/2018 1328   LDLCALC 77 07/02/2018 1328   Recent Results (from the past 2160 hour(s))  TSH     Status: None   Collection Time: 03/01/19 11:00 AM  Result Value Ref Range   TSH 2.67 mIU/L    Comment:           Reference Range .           > or = 20 Years  0.40-4.50 .                Pregnancy Ranges           First trimester    0.26-2.66           Second trimester   0.55-2.73           Third trimester    0.43-2.91   T4, free     Status: None   Collection Time: 03/01/19 11:00 AM  Result Value Ref Range   Free T4 1.3 0.8 - 1.8 ng/dL     - Thyroid uptake and scan on 07/19/2016 was nonfocal with 25% uptake and 24 hours.  March 05, 2018 thyroid uptake and scan: FINDINGS: Normal thyroid scan.  No hot or cold nodules are identified.  4 hour I-131 uptake = 17.6% (normal 5-20%) 24 hour I-131 uptake = 30.0% (normal 10-30%)  IMPRESSION: 1. Normal thyroid scan. 2.  Top-normal 24 hour iodine 131 uptake at 30%.  I-131 thyroid ablation on June 29, 2018.  Assessment & Plan:   1.  RAI induced hypothyroidism  2.  Type 2 diabetes -She is status post thyroid ablation with I-131 for significant T3 toxicosis.  Treatment was administered on June 29, 2018.   -Her previsit thyroid function tests confirm full effect of RAI therapy and inadequate replacement with her current dose of levothyroxine. -Based on her previsit thyroid function test, she would benefit from slight increase in her levothyroxine dose.  Based on her reported rash which was confirmed by physical exam, she would benefit from brand Synthroid versus generic levothyroxine.    I discussed and prescribed Synthroid 112 mcg p.o. daily before breakfast.    - We discussed about the correct intake of her thyroid hormone, on empty stomach at fasting, with water, separated by at least 30 minutes from breakfast and other medications,  and separated by more than 4 hours from calcium, iron, multivitamins, acid reflux medications (PPIs). -Patient is made aware of the fact that thyroid hormone replacement is needed for life, dose to be adjusted by periodic monitoring of thyroid function tests.    She has type 2 diabetes with recent A1c of 7.7%. She is advised to continue metformin 1000 mg p.o. twice daily and follow-up with her PCP.  - I advised patient to maintain close follow up with [name], [name], [name] for primary care needs, including diabetes care.  Time for this visit: 15 minutes. Daine Floras  participated in the discussions, expressed understanding, and voiced agreement with the above plans.  All questions were answered to her satisfaction. she is encouraged to contact clinic should she have any questions or concerns prior to her return visit.    Follow up plan: Return in about 6 months (around 09/08/2019) for Follow up with Pre-visit Labs.  Marquis Lunch, MD Phone: [phone]  Fax:  [phone]   03/11/2019, 2:39 PM

## 2019-03-25 ENCOUNTER — Other Ambulatory Visit: Payer: Self-pay | Admitting: Family

## 2019-03-25 DIAGNOSIS — E785 Hyperlipidemia, unspecified: Secondary | ICD-10-CM

## 2019-05-08 ENCOUNTER — Other Ambulatory Visit: Payer: Self-pay

## 2019-05-09 ENCOUNTER — Ambulatory Visit (INDEPENDENT_AMBULATORY_CARE_PROVIDER_SITE_OTHER): Payer: Medicaid Other | Admitting: Family

## 2019-05-09 ENCOUNTER — Other Ambulatory Visit: Payer: Self-pay | Admitting: Family

## 2019-05-09 ENCOUNTER — Encounter: Payer: Self-pay | Admitting: Family

## 2019-05-09 VITALS — BP 125/75 | HR 78 | Temp 97.7°F | Ht 63.0 in | Wt 226.0 lb

## 2019-05-09 DIAGNOSIS — E1159 Type 2 diabetes mellitus with other circulatory complications: Secondary | ICD-10-CM | POA: Diagnosis not present

## 2019-05-09 DIAGNOSIS — J449 Chronic obstructive pulmonary disease, unspecified: Secondary | ICD-10-CM

## 2019-05-09 DIAGNOSIS — E785 Hyperlipidemia, unspecified: Secondary | ICD-10-CM

## 2019-05-09 DIAGNOSIS — E1169 Type 2 diabetes mellitus with other specified complication: Secondary | ICD-10-CM

## 2019-05-09 DIAGNOSIS — F411 Generalized anxiety disorder: Secondary | ICD-10-CM

## 2019-05-09 DIAGNOSIS — I10 Essential (primary) hypertension: Secondary | ICD-10-CM

## 2019-05-09 DIAGNOSIS — L409 Psoriasis, unspecified: Secondary | ICD-10-CM | POA: Diagnosis not present

## 2019-05-09 DIAGNOSIS — F320 Major depressive disorder, single episode, mild: Secondary | ICD-10-CM

## 2019-05-09 DIAGNOSIS — E890 Postprocedural hypothyroidism: Secondary | ICD-10-CM

## 2019-05-09 DIAGNOSIS — E559 Vitamin D deficiency, unspecified: Secondary | ICD-10-CM | POA: Diagnosis not present

## 2019-05-09 DIAGNOSIS — E1165 Type 2 diabetes mellitus with hyperglycemia: Secondary | ICD-10-CM | POA: Diagnosis not present

## 2019-05-09 DIAGNOSIS — E0590 Thyrotoxicosis, unspecified without thyrotoxic crisis or storm: Secondary | ICD-10-CM

## 2019-05-09 DIAGNOSIS — I152 Hypertension secondary to endocrine disorders: Secondary | ICD-10-CM

## 2019-05-09 LAB — BAYER DCA HB A1C WAIVED: HB A1C (BAYER DCA - WAIVED): 7.7 % — ABNORMAL HIGH (ref <7.0)

## 2019-05-09 MED ORDER — CALCIPOTRIENE 0.005 % EX OINT: TOPICAL_OINTMENT | Freq: Two times a day (BID) | CUTANEOUS | 2 refills | Status: DC

## 2019-05-09 NOTE — Progress Notes (Signed)
Subjective:    Patient ID: Sherry Glass, female    DOB: 06-Oct-1970, 49 y.o.   MRN: [id number]  Chief Complaint  Patient presents with  . Medical Management of Chronic Issues  . Diabetes   Pt presents to the office today for chronic follow up. Pt is followed by Pulmonologist35monthfor COPD, Restrictive lung disease, and SOB. Followed by Endocrinologists every 3 months for hyperthyroidism. She had thyroid ablation.  Has colonoscopy scheduled on 05/30/19.   She states the dermatologists we referred her to would not see her related to her insurance to psoriasis.  Diabetes She presents for her follow-up diabetic visit. She has type 2 diabetes mellitus. Her disease course has been stable. Hypoglycemia symptoms include nervousness/anxiousness. Associated symptoms include fatigue and foot paresthesias. Pertinent negatives for diabetes include no blurred vision. Symptoms are worsening. Diabetic complications include peripheral neuropathy. Pertinent negatives for diabetic complications include no CVA or heart disease. Risk factors for coronary artery disease include dyslipidemia, diabetes mellitus, hypertension, post-menopausal and sedentary lifestyle. She is following a generally unhealthy diet. Her overall blood glucose range is 180-200 mg/dl.  Hyperlipidemia This is a chronic problem. The current episode started more than 1 year ago. The problem is controlled. Recent lipid tests were reviewed and are normal. Exacerbating diseases include obesity. Current antihyperlipidemic treatment includes statins. The current treatment provides moderate improvement of lipids. Risk factors for coronary artery disease include dyslipidemia, diabetes mellitus, hypertension, a sedentary lifestyle and post-menopausal.  Depression        This is a chronic problem.  The current episode started more than 1 year ago.   The onset quality is gradual.   Associated symptoms include fatigue, irritable, restlessness, appetite  change and sad.  Associated symptoms include no helplessness and no hopelessness.  Past treatments include SSRIs - Selective serotonin reuptake inhibitors.  Past medical history includes thyroid problem and anxiety.   Anxiety Presents for follow-up visit. Symptoms include depressed mood, excessive worry, irritability, nervous/anxious behavior and restlessness. Symptoms occur most days. The quality of sleep is good.    Thyroid Problem Presents for follow-up visit. Symptoms include anxiety, depressed mood and fatigue. Patient reports no constipation or dry skin. Her past medical history is significant for hyperlipidemia.  COPD Uses Stiolto and uses albuterol BID lately related to the change in weather.     Review of Systems  Constitutional: Positive for appetite change, fatigue and irritability.  Eyes: Negative for blurred vision.  Gastrointestinal: Negative for constipation.  Psychiatric/Behavioral: Positive for depression. The patient is nervous/anxious.   All other systems reviewed and are negative.      Objective:   Physical Exam Vitals reviewed.  Constitutional:      General: She is irritable. She is not in acute distress.    Appearance: She is well-developed. She is obese.  HENT:     Head: Normocephalic and atraumatic.     Right Ear: External ear normal.  Eyes:     Pupils: Pupils are equal, round, and reactive to light.  Neck:     Thyroid: No thyromegaly.  Cardiovascular:     Rate and Rhythm: Normal rate and regular rhythm.     Heart sounds: Normal heart sounds. No murmur.  Pulmonary:     Effort: Pulmonary effort is normal. No respiratory distress.     Breath sounds: Normal breath sounds. No wheezing.  Abdominal:     General: Bowel sounds are normal. There is no distension.     Palpations: Abdomen is soft.  Tenderness: There is no abdominal tenderness.  Musculoskeletal:        General: No tenderness. Normal range of motion.     Cervical back: Normal range of  motion and neck supple.  Skin:    General: Skin is warm and dry.     Findings: Rash present.     Comments: Plaque erythemas rash on bilateral elbows and knees  Neurological:     Mental Status: She is alert and oriented to person, place, and time.     Cranial Nerves: No cranial nerve deficit.     Deep Tendon Reflexes: Reflexes are normal and symmetric.  Psychiatric:        Behavior: Behavior normal.        Thought Content: Thought content normal.        Judgment: Judgment normal.       BP 125/75   Pulse 78   Temp 97.7 F (36.5 C) (Temporal)   Ht _0  (1.6 m)   Wt 226 lb (102.5 kg)   SpO2 98%   BMI 40.03 kg/m      Assessment & Plan:  Sherry Glass comes in today with chief complaint of Medical Management of Chronic Issues and Diabetes   Diagnosis and orders addressed:  1. Hypertension associated with diabetes ([place]) - CMP14+EGFR - CBC with Differential/Platelet  2. Chronic obstructive pulmonary disease, unspecified COPD type ([place]) - CMP14+EGFR - CBC with Differential/Platelet  3. Uncontrolled type 2 diabetes mellitus with hyperglycemia ([facility]) - CMP14+EGFR - CBC with Differential/Platelet - hgba1c - Microalbumin / creatinine urine ratio  4. Hyperthyroidism - CMP14+EGFR - CBC with Differential/Platelet  5. Hyperlipidemia associated with type 2 diabetes mellitus ([facility]) - CMP14+EGFR - CBC with Differential/Platelet - Lipid panel  6. Hypothyroidism following radioiodine therapy - CMP14+EGFR - CBC with Differential/Platelet  7. Morbid obesity ([place]) - CMP14+EGFR - CBC with Differential/Platelet  8. Vitamin D deficiency - CMP14+EGFR - CBC with Differential/Platelet  9. GAD (generalized anxiety disorder) - CMP14+EGFR - CBC with Differential/Platelet  10. Depression, major, single episode, mild ([facility]) - CMP14+EGFR - CBC with Differential/Platelet  11. Psoriasis Start Calcipotriene BID - calcipotriene (DOVONOX) 0.005 % ointment; Apply topically 2 (two)  times daily.  Dispense: 120 g; Refill: 2 - CMP14+EGFR - CBC with Differential/Platelet - Ambulatory referral to Dermatology   Labs pending Health Maintenance reviewed Diet and exercise encouraged  Follow up plan: 4 months   Evelina Dun, [name]

## 2019-05-09 NOTE — Patient Instructions (Signed)
Psoriasis Psoriasis is a long-term (chronic) skin condition. It occurs because your immune system causes skin cells to form too quickly. As a result, too many skin cells grow and create raised, red patches (plaques) that often look silvery on your skin. Plaques may show up anywhere on your body. They can be any size or shape. Symptoms of this condition range from mild to very severe. Psoriasis cannot be passed from one person to another (is not contagious). Sometimes, the symptoms go away and then come back again. What are the causes? The cause of psoriasis is not known, but certain factors can make the condition worse. These include:  Damage or trauma to the skin, such as cuts, scrapes, sunburn, and dryness.  Not enough exposure to sunlight.  Certain medicines.  Alcohol.  Tobacco use.  Stress.  Infections caused by bacteria or viruses. What increases the risk? You are more likely to develop this condition if you:  Have a family history of psoriasis.  Are obese.  Are 20-40 years old.  Are taking certain medicines. What are the signs or symptoms? There are different types of psoriasis. You can have more than one type of psoriasis during your life. The types are:  Plaque. This is the most common.  Guttate. This is also called eruptive psoriasis.  Inverse.  Pustular.  Erythrodermic.  Sebopsoriasis.  Psoriatic arthritis. Each type of psoriasis has different symptoms.  Plaque psoriasis symptoms include red, raised plaques with a silvery-white coating (scale). These plaques may be itchy. Your nails may be pitted and crumbly or fall off.  Guttate psoriasis symptoms include small red spots that often show up on your trunk, arms, and legs. These spots may develop after you have been sick, especially with strep throat.  Inverse psoriasis symptoms include plaques in your underarm area, under your breasts, or on your genitals, groin, or buttocks.  Pustular psoriasis symptoms  include pus-filled bumps that are painful, red, and swollen on the palms of your hands or the soles of your feet. You also may feel exhausted, feverish, weak, or have no appetite.  Erythrodermic psoriasis symptoms include bright red skin that may look burned. You may have a fast heartbeat and a body temperature that is too high or too low. You may be itchy or in pain.  Sebopsoriasis symptoms include red plaques that have a greasy coating, and are often on your scalp, forehead, and face.  Psoriatic arthritis causes swollen, painful joints along with scaly skin plaques. How is this diagnosed? This condition is diagnosed based on your symptoms, family history, and a physical exam.  You may also be referred to a health care provider who specializes in skin diseases (dermatologist).  Your health care provider may remove a tissue sample (biopsy) for testing. How is this treated? There is no cure for this condition, but treatment can help manage it. Goals of treatment include:  Helping your skin heal.  Reducing itching and inflammation.  Slowing the growth of new skin cells.  Helping your immune system respond better to your skin. Treatment varies, depending on the severity of your condition. This condition may be treated by:  Creams or ointments to help with symptoms.  Ultraviolet ray exposure (light therapy or phototherapy). This may include natural sunlight or light therapy in a medical office.  Medicines (systemic therapy). These medicines can help your body better manage skin cell turnover and inflammation. Medicines may be given in the form of pills or injections. They may be used along with light   therapy or ointments. You may also get antibiotic medicines if you have an infection. Follow these instructions at home: Skin Care  Moisturize your skin as needed. Only use moisturizers that have been approved by your health care provider.  Apply cool, wet cloths (cold compresses) to the  affected areas.  Do not use a hot tub or take hot showers. Take lukewarm showers and baths.  Do not scratch your skin. Lifestyle   Do not use any products that contain nicotine or tobacco, such as cigarettes, e-cigarettes, and chewing tobacco. If you need help quitting, ask your health care provider.  Use techniques for stress reduction, such as meditation or yoga.  Maintain a healthy weight. Follow instructions from your health care provider for weight control. These may include dietary restrictions.  Get safe exposure to the sun as told by your health care provider. This may include spending regular intervals of time outdoors in sunlight. Do not get sunburned.  Consider joining a psoriasis support group. Medicines  Take or use over-the-counter and prescription medicines only as told by your health care provider.  If you were prescribed an antibiotic medicine, take it as told by your health care provider. Do not stop using the antibiotic even if you start to feel better. Alcohol use  Limit how much you use: ? 0-1 drink a day for women. ? 0-2 drinks a day for men.  Be aware of how much alcohol is in your drink. In the [place] one drink equals one 12 oz bottle of beer (355 mL), one 5 oz glass of wine (148 mL), or one 1 oz glass of hard liquor (44 mL). General instructions  Keep a journal to help track what triggers an outbreak. Try to avoid any triggers.  See a counselor if feelings of sadness, frustration, and hopelessness about your condition are interfering with your work and relationships.  Keep all follow-up visits as told by your health care provider. This is important. Contact a health care provider if:  You have a fever.  Your pain gets worse.  You have increasing redness or warmth in the affected areas.  You have new or worsening pain or stiffness in your joints.  Your nails start to break easily or pull away from the nail bed.  You feel  depressed. Summary  Psoriasis is a long-term (chronic) skin condition. Patches (plaques) may show up anywhere on your body.  There is no cure for this condition, but treatment can help manage it. Treatment varies, depending on the severity of your condition.  Keep a journal to track what triggers an outbreak. Try to avoid any triggers.  Take or use over-the-counter and prescription medicines only as told by your health care provider.  Keep all follow-up visits as told by your health care provider. This is important. This information is not intended to replace advice given to you by your health care provider. Make sure you discuss any questions you have with your health care provider. Document Revised: 02/20/2018 Document Reviewed: 02/20/2018 Elsevier Patient Education  2020 [employer]  

## 2019-05-10 LAB — CBC WITH DIFFERENTIAL/PLATELET
Basophils Absolute: 0.1 10*3/uL (ref 0.0–0.2)
Basos: 1 %
EOS (ABSOLUTE): 0.2 10*3/uL (ref 0.0–0.4)
Eos: 2 %
Hematocrit: 42.2 % (ref 34.0–46.6)
Hemoglobin: 14.4 g/dL (ref 11.1–15.9)
Immature Grans (Abs): 0 10*3/uL (ref 0.0–0.1)
Immature Granulocytes: 0 %
Lymphocytes Absolute: 3.7 10*3/uL — ABNORMAL HIGH (ref 0.7–3.1)
Lymphs: 37 %
MCH: 33 pg (ref 26.6–33.0)
MCHC: 34.1 g/dL (ref 31.5–35.7)
MCV: 97 fL (ref 79–97)
Monocytes Absolute: 0.6 10*3/uL (ref 0.1–0.9)
Monocytes: 7 %
Neutrophils Absolute: 5.3 10*3/uL (ref 1.4–7.0)
Neutrophils: 53 %
Platelets: 265 10*3/uL (ref 150–450)
RBC: 4.37 x10E6/uL (ref 3.77–5.28)
RDW: 12.6 % (ref 11.7–15.4)
WBC: 9.9 10*3/uL (ref 3.4–10.8)

## 2019-05-10 LAB — CMP14+EGFR
ALT: 46 IU/L — ABNORMAL HIGH (ref 0–32)
AST: 34 IU/L (ref 0–40)
Albumin/Globulin Ratio: 2 (ref 1.2–2.2)
Albumin: 4.5 g/dL (ref 3.8–4.8)
Alkaline Phosphatase: 86 IU/L (ref 39–117)
BUN/Creatinine Ratio: 18 (ref 9–23)
BUN: 10 mg/dL (ref 6–24)
Bilirubin Total: 0.4 mg/dL (ref 0.0–1.2)
CO2: 23 mmol/L (ref 20–29)
Calcium: 10 mg/dL (ref 8.7–10.2)
Chloride: 102 mmol/L (ref 96–106)
Creatinine, Ser: 0.57 mg/dL (ref 0.57–1.00)
GFR calc Af Amer: 125 mL/min/{1.73_m2} (ref >59)
GFR calc non Af Amer: 108 mL/min/{1.73_m2} (ref >59)
Globulin, Total: 2.2 g/dL (ref 1.5–4.5)
Glucose: 257 mg/dL — ABNORMAL HIGH (ref 65–99)
Potassium: 4.7 mmol/L (ref 3.5–5.2)
Sodium: 138 mmol/L (ref 134–144)
Total Protein: 6.7 g/dL (ref 6.0–8.5)

## 2019-05-10 LAB — MICROALBUMIN / CREATININE URINE RATIO
Creatinine, Urine: 85.1 mg/dL
Microalb/Creat Ratio: 6 mg/g creat (ref 0–29)
Microalbumin, Urine: 4.9 ug/mL

## 2019-05-10 LAB — LIPID PANEL
Chol/HDL Ratio: 3.6 ratio (ref 0.0–4.4)
Cholesterol, Total: 177 mg/dL (ref 100–199)
HDL: 49 mg/dL (ref >39)
LDL Chol Calc (NIH): 83 mg/dL (ref 0–99)
Triglycerides: 275 mg/dL — ABNORMAL HIGH (ref 0–149)
VLDL Cholesterol Cal: 45 mg/dL — ABNORMAL HIGH (ref 5–40)

## 2019-05-13 ENCOUNTER — Telehealth: Payer: Self-pay | Admitting: *Deleted

## 2019-05-13 NOTE — Telephone Encounter (Signed)
I called patient and made aware procedure will be cancelled d/t recent surge in cases. Advised will call back to r/s. She voiced understanding.

## 2019-05-14 ENCOUNTER — Other Ambulatory Visit: Payer: Self-pay | Admitting: Family

## 2019-05-14 MED ORDER — ATORVASTATIN CALCIUM 20 MG PO TABS: 20.0000 mg | ORAL_TABLET | Freq: Every day | ORAL | 3 refills | Status: DC

## 2019-05-14 MED ORDER — JARDIANCE 10 MG PO TABS: 10.0000 mg | ORAL_TABLET | Freq: Every day | ORAL | 1 refills | Status: DC

## 2019-05-15 ENCOUNTER — Other Ambulatory Visit: Payer: Self-pay | Admitting: Family

## 2019-05-15 DIAGNOSIS — L578 Other skin changes due to chronic exposure to nonionizing radiation: Secondary | ICD-10-CM | POA: Diagnosis not present

## 2019-05-15 DIAGNOSIS — L409 Psoriasis, unspecified: Secondary | ICD-10-CM | POA: Diagnosis not present

## 2019-05-15 DIAGNOSIS — R7401 Elevation of levels of liver transaminase levels: Secondary | ICD-10-CM

## 2019-05-15 DIAGNOSIS — L400 Psoriasis vulgaris: Secondary | ICD-10-CM | POA: Diagnosis not present

## 2019-05-16 ENCOUNTER — Telehealth: Payer: Self-pay | Admitting: *Deleted

## 2019-05-16 MED ORDER — DOVONEX 0.005 % EX CREA: TOPICAL_CREAM | Freq: Two times a day (BID) | CUTANEOUS | 2 refills | Status: DC

## 2019-05-16 NOTE — Telephone Encounter (Signed)
Calcipotriene 0.005% ointment Non Preferred by Medicaid  Pt must try and fail Brand Name Dovonex 0.005% Cream-Brand Name rx sent to pharmacy.

## 2019-05-16 NOTE — Progress Notes (Signed)
Left message to please call our office. 

## 2019-05-17 LAB — HEPATITIS PANEL, ACUTE
Hep A IgM: NEGATIVE
Hep B C IgM: NEGATIVE
Hep C Virus Ab: 0.2 s/co ratio (ref 0.0–0.9)
Hepatitis B Surface Ag: NEGATIVE

## 2019-05-17 LAB — SPECIMEN STATUS REPORT

## 2019-05-21 ENCOUNTER — Encounter: Payer: Self-pay | Admitting: Internal Medicine

## 2019-05-23 ENCOUNTER — Other Ambulatory Visit (HOSPITAL_COMMUNITY): Payer: Self-pay | Admitting: Obstetrics and Gynecology

## 2019-05-23 ENCOUNTER — Other Ambulatory Visit: Payer: Self-pay

## 2019-05-23 ENCOUNTER — Other Ambulatory Visit: Payer: Self-pay | Admitting: Family

## 2019-05-23 ENCOUNTER — Ambulatory Visit (HOSPITAL_COMMUNITY)
Admission: RE | Admit: 2019-05-23 | Discharge: 2019-05-23 | Disposition: A | Discharge status: Home / Self Care | Payer: Medicaid Other | Source: Ambulatory Visit | Attending: Family | Admitting: Family

## 2019-05-23 DIAGNOSIS — I5189 Other ill-defined heart diseases: Secondary | ICD-10-CM

## 2019-05-23 DIAGNOSIS — K7689 Other specified diseases of liver: Secondary | ICD-10-CM | POA: Diagnosis not present

## 2019-05-23 DIAGNOSIS — Q212 Atrioventricular septal defect: Secondary | ICD-10-CM

## 2019-05-23 DIAGNOSIS — R7401 Elevation of levels of liver transaminase levels: Secondary | ICD-10-CM | POA: Diagnosis not present

## 2019-05-23 DIAGNOSIS — K760 Fatty (change of) liver, not elsewhere classified: Secondary | ICD-10-CM | POA: Insufficient documentation

## 2019-05-28 ENCOUNTER — Other Ambulatory Visit (HOSPITAL_COMMUNITY): Payer: Medicaid Other

## 2019-05-28 ENCOUNTER — Encounter: Payer: Self-pay | Admitting: *Deleted

## 2019-05-28 ENCOUNTER — Telehealth: Payer: Self-pay | Admitting: *Deleted

## 2019-05-28 NOTE — Telephone Encounter (Signed)
Pt called back and scheduled her procedure for 08/05/2019.  Pt is aware that I will mail her out prep instructions, Covid screening information, and pre-op visit.  Pt voiced understanding.

## 2019-05-28 NOTE — Telephone Encounter (Signed)
Lmom for pt to call us back. 

## 2019-05-29 ENCOUNTER — Encounter: Payer: Self-pay | Admitting: *Deleted

## 2019-05-30 ENCOUNTER — Ambulatory Visit (HOSPITAL_COMMUNITY): Admit: 2019-05-30 | Payer: Medicaid Other | Admitting: Internal Medicine

## 2019-05-30 ENCOUNTER — Encounter (HOSPITAL_COMMUNITY): Payer: Self-pay

## 2019-05-30 SURGERY — COLONOSCOPY WITH PROPOFOL
Anesthesia: Monitor Anesthesia Care

## 2019-06-04 ENCOUNTER — Encounter: Payer: Self-pay | Admitting: Gastroenterology

## 2019-06-04 ENCOUNTER — Other Ambulatory Visit: Payer: Self-pay

## 2019-06-04 ENCOUNTER — Ambulatory Visit: Payer: Medicaid Other | Admitting: Gastroenterology

## 2019-06-04 VITALS — BP 136/77 | HR 82 | Temp 96.8°F | Ht 64.0 in | Wt 219.2 lb

## 2019-06-04 DIAGNOSIS — K760 Fatty (change of) liver, not elsewhere classified: Secondary | ICD-10-CM

## 2019-06-04 DIAGNOSIS — R195 Other fecal abnormalities: Secondary | ICD-10-CM | POA: Diagnosis not present

## 2019-06-04 NOTE — Patient Instructions (Addendum)
Keep plans upcoming for colonoscopy.  Excellent work on changing eating habits, exercising, and lifestyle changes. We will recheck liver numbers in 6 months. I have attached a handout regarding fatty liver. If your numbers keep trending up, we will do more blood work. Continue with efforts for tighter blood sugar control.   We will see you in 6 months!   I enjoyed seeing you again today! As you know, I value our relationship and want to provide genuine, compassionate, and quality care. I welcome your feedback. If you receive a survey regarding your visit,  I greatly appreciate you taking time to fill this out. See you next time!  Gelene Mink, PhD, ANP-BC [facility]   Fatty Liver Disease  Fatty liver disease occurs when too much fat has built up in your liver cells. Fatty liver disease is also called hepatic steatosis or steatohepatitis. The liver removes harmful substances from your bloodstream and produces fluids that your body needs. It also helps your body use and store energy from the food you eat. In many cases, fatty liver disease does not cause symptoms or problems. It is often diagnosed when tests are being done for other reasons. However, over time, fatty liver can cause inflammation that may lead to more serious liver problems, such as scarring of the liver (cirrhosis) and liver failure. Fatty liver is associated with insulin resistance, increased body fat, high blood pressure (hypertension), and high cholesterol. These are features of metabolic syndrome and increase your risk for stroke, diabetes, and heart disease. What are the causes? This condition may be caused by:  Drinking too much alcohol.  Poor nutrition.  Obesity.  Cushing's syndrome.  Diabetes.  High cholesterol.  Certain drugs.  Poisons.  Some viral infections.  Pregnancy. What increases the risk? You are more likely to develop this condition if you:  Abuse alcohol.  Are  overweight.  Have diabetes.  Have hepatitis.  Have a high triglyceride level.  Are pregnant. What are the signs or symptoms? Fatty liver disease often does not cause symptoms. If symptoms do develop, they can include:  Fatigue.  Weakness.  Weight loss.  Confusion.  Abdominal pain.  Nausea and vomiting.  Yellowing of your skin and the white parts of your eyes (jaundice).  Itchy skin. How is this diagnosed? This condition may be diagnosed by:  A physical exam and medical history.  Blood tests.  Imaging tests, such as an ultrasound, CT scan, or MRI.  A liver biopsy. A small sample of liver tissue is removed using a needle. The sample is then looked at under a microscope. How is this treated? Fatty liver disease is often caused by other health conditions. Treatment for fatty liver may involve medicines and lifestyle changes to manage conditions such as:  Alcoholism.  High cholesterol.  Diabetes.  Being overweight or obese. Follow these instructions at home:   Do not drink alcohol. If you have trouble quitting, ask your health care provider how to safely quit with the help of medicine or a supervised program. This is important to keep your condition from getting worse.  Eat a healthy diet as told by your health care provider. Ask your health care provider about working with a diet and nutrition specialist (dietitian) to develop an eating plan.  Exercise regularly. This can help you lose weight and control your cholesterol and diabetes. Talk to your health care provider about an exercise plan and which activities are best for you.  Take over-the-counter and prescription medicines  only as told by your health care provider.  Keep all follow-up visits as told by your health care provider. This is important. Contact a health care provider if: You have trouble controlling your:  Blood sugar. This is especially important if you have  diabetes.  Cholesterol.  Drinking of alcohol. Get help right away if:  You have abdominal pain.  You have jaundice.  You have nausea and vomiting.  You vomit blood or material that looks like coffee grounds.  You have stools that are black, tar-like, or bloody. Summary  Fatty liver disease develops when too much fat builds up in the cells of your liver.  Fatty liver disease often causes no symptoms or problems. However, over time, fatty liver can cause inflammation that may lead to more serious liver problems, such as scarring of the liver (cirrhosis).  You are more likely to develop this condition if you abuse alcohol, are pregnant, are overweight, have diabetes, have hepatitis, or have high triglyceride levels.  Contact your health care provider if you have trouble controlling your weight, blood sugar, cholesterol, or drinking of alcohol. This information is not intended to replace advice given to you by your health care provider. Make sure you discuss any questions you have with your health care provider. Document Revised: 03/31/2017 Document Reviewed: 01/25/2017 Elsevier Patient Education  2020 [employer].

## 2019-06-04 NOTE — Progress Notes (Signed)
Referring Provider: Junie Spencer, [name] Primary Care Physician:  Junie Spencer, [name] Primary GI: Dr. Jena Gauss   Chief Complaint  Patient presents with  . Abdominal Pain    Elevated alt level,diarrhea    HPI:   Sherry Glass is a 49 y.o. female presenting today with a history of intermittent diarrhea likely related to IBS-D, need for initial screening colonoscopy and already planned for April 2021, returning today at request of Jannifer Rodney, [name], due to elevated ALT.   Previous LFTs have been normal, with new onset mildly isolated ALT in March 2020 of 40, and ALT 46 most recently Jan 2021. A1c 7.7. Lipid panel with hypertriglyceridemia. Hepatitis panel negative (Hep A IgM, Hep B surface antigen, Hep B core IgM, Hep C antibody). US abdomen limited RUQ with diffuse hepatic steatosis. Unable to exclude degree of underlying liver disease.   Chronically on metformin. If holds off on metformin, not as bad looser stool. Feels like loose stool has increased some. 4-5 times per day. Will have alternating soft stool and watery. Chronically soft stools since having gallbladder out. No recent antibiotics. Wants to wait on stool studies. Changing diet. Not eating breads or noodles. Now using spinach and tomato wraps. Veggie noodles. Cut back on potatoes. Going to [employer] three days a week. Doing cardio, abdominal exercises, weights.   She declines supportive measures for loose stool.    Past Medical History:  Diagnosis Date  . Allergic rhinitis   . Anxiety and depression   . Cataract   . Chest pain syndrome   . COPD (chronic obstructive pulmonary disease) (HCC)   . Depression   . Diabetes mellitus without complication (HCC)   . History of kidney stones   . HTN (hypertension)   . Hypercholesterolemia   . Hyperglycemia   . Hypertension   . Obesity   . Restless leg syndrome   . Tobacco abuse     Past Surgical History:  Procedure Laterality Date  . ABDOMINAL HYSTERECTOMY    .  CARDIAC CATHETERIZATION  2014  . CATARACT EXTRACTION W/PHACO Left 06/23/2016   Procedure: CATARACT EXTRACTION PHACO AND INTRAOCULAR LENS PLACEMENT (IOC);  Surgeon: Gemma Payor, MD;  Location: [facility];  Service: Ophthalmology;  Laterality: Left;  left - pt knows to arrive at 10:50 cde 9.11  . CESAREAN SECTION    . CHOLECYSTECTOMY    . EXCISION MASS NECK     cyst  . FOOT MASS EXCISION    . left foot surgery     removal of cyst  . NECK SURGERY    . TONSILLECTOMY      Current Outpatient Medications  Medication Sig Dispense Refill  . ACCU-CHEK FASTCLIX LANCETS MISC USE TO check blood glucose twice daily 102 each 2  . albuterol (PROVENTIL) (2.5 MG/3ML) 0.083% nebulizer solution Use 1 vial (3 mL) via nebulizer every 6 hours as needed for wheezing or shortness of breath 150 mL 5  . aspirin EC 81 MG tablet Take one po qd 90 tablet 1  . atorvastatin (LIPITOR) 20 MG tablet Take 1 tablet (20 mg total) by mouth daily. 90 tablet 3  . budesonide (PULMICORT) 0.5 MG/2ML nebulizer solution Take 2 mLs (0.5 mg total) 2 (two) times daily by nebulization. 120 mL 3  . cyclobenzaprine (FLEXERIL) 10 MG tablet Take 1 tablet by mouth three times daily as needed for muscle spasm 90 tablet 0  . DOVONEX 0.005 % cream Apply topically 2 (two) times daily. 60 g 2  .  DULoxetine (CYMBALTA) 60 MG capsule Take 1 capsule (60 mg total) by mouth daily. 90 capsule 1  . empagliflozin (JARDIANCE) 10 MG TABS tablet Take 10 mg by mouth daily before breakfast. 90 tablet 1  . formoterol (PERFOROMIST) 20 MCG/2ML nebulizer solution Take 2 mLs (20 mcg total) by nebulization 2 (two) times daily. 120 mL 6  . gabapentin (NEURONTIN) 300 MG capsule Take 1 capsule by mouth twice daily 180 capsule 0  . glimepiride (AMARYL) 2 MG tablet Take 1 tablet by mouth once daily with breakfast 90 tablet 1  . glucose blood (ACCU-CHEK GUIDE) test strip USE TO check blood glucose twice daily 100 each 2  . losartan (COZAAR) 25 MG tablet Take 1 tablet by mouth  once daily 90 tablet 1  . metFORMIN (GLUCOPHAGE) 1000 MG tablet TAKE 1 TABLET BY MOUTH TWICE DAILY WITH MEALS 180 tablet 1  . nitroGLYCERIN (NITROSTAT) 0.4 MG SL tablet Place 1 tablet (0.4 mg total) under the tongue every 5 (five) minutes as needed for chest pain. 25 tablet 3  . propranolol (INDERAL) 20 MG tablet Take 1 tablet (20 mg total) by mouth 2 (two) times daily. 60 tablet 1  . STIOLTO RESPIMAT 2.5-2.5 MCG/ACT AERS INHALE 2 PUFFS BY MOUTH ONCE DAILY    . SYNTHROID 112 MCG tablet Take 1 tablet (112 mcg total) by mouth daily before breakfast. 30 tablet 6  . acetaminophen (TYLENOL) 500 MG tablet Take 500 mg by mouth every 6 (six) hours as needed.     No current facility-administered medications for this visit.    Allergies as of 06/04/2019 - Review Complete 06/04/2019  Allergen Reaction Noted  . Dilaudid [hydromorphone]  05/13/2016  . Hydromorphone  07/03/2012  . Valproic acid and related  07/03/2012    Family History  Adopted: Yes  Family history unknown: Yes    Social History   Socioeconomic History  . Marital status: Widowed    Spouse name: Not on file  . Number of children: Not on file  . Years of education: Not on file  . Highest education level: Not on file  Occupational History  . Not on file  Tobacco Use  . Smoking status: Former Smoker    Packs/day: 1.00    Years: 25.00    Pack years: 25.00    Types: Cigarettes    Start date: 11/13/1988    Quit date: 10/28/2016    Years since quitting: 2.6  . Smokeless tobacco: Never Used  Substance and Sexual Activity  . Alcohol use: Yes    Comment: For special occasions, very rare   . Drug use: No  . Sexual activity: Never    Birth control/protection: Surgical  Other Topics Concern  . Not on file  Social History Narrative   [facility] (11/18/16):   Originally from [place] but grew up in [place]. Cared for her husband until he passed. 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  Does have significant dust exposure. Has 3 dogs currently. Previously had a parakeet as a child. No mold exposure. Enjoys playing with her grandchildren.        Social Determinants of Health   Financial Resource Strain:   . Difficulty of Paying Living Expenses: Not on file  Food Insecurity:   . Worried About Charity fundraiser in the Last Year: Not on file  . Ran Out of Food in the Last Year: Not on file  Transportation Needs:   . Lack of Transportation (Medical): Not  on file  . Lack of Transportation (Non-Medical): Not on file  Physical Activity:   . Days of Exercise per Week: Not on file  . Minutes of Exercise per Session: Not on file  Stress:   . Feeling of Stress : Not on file  Social Connections:   . Frequency of Communication with Friends and Family: Not on file  . Frequency of Social Gatherings with Friends and Family: Not on file  . Attends Religious Services: Not on file  . Active Member of Clubs or Organizations: Not on file  . Attends Banker Meetings: Not on file  . Marital Status: Not on file    Review of Systems: Gen: Denies fever, chills, anorexia. Denies fatigue, weakness, weight loss.  CV: Denies chest pain, palpitations, syncope, peripheral edema, and claudication. Resp: Denies dyspnea at rest, cough, wheezing, coughing up blood, and pleurisy. GI: see HPI Derm: Denies rash, itching, dry skin Psych: Denies depression, anxiety, memory loss, confusion. No homicidal or suicidal ideation.  Heme: Denies bruising, bleeding, and enlarged lymph nodes.  Physical Exam: BP 136/77   Pulse 82   Temp (!) 96.8 F (36 C)   Ht 5\' 4"  (1.626 m)   Wt 219 lb 3.2 oz (99.4 kg)   BMI 37.63 kg/m  General:   Alert and oriented. No distress noted. Pleasant and cooperative.  Head:  Normocephalic and atraumatic. Eyes:  Conjuctiva clear without scleral icterus. Lungs: clear bilaterally Cardiac: S1 S2 present without murmurs Abdomen:  +BS, soft, obese, non-tender and  non-distended. No rebound or guarding. No HSM or masses noted. Msk:  Symmetrical without gross deformities. Normal posture. Extremities:  Without edema. Neurologic:  Alert and  oriented x4 Psych:  Alert and cooperative. Normal mood and affect.  ASSESSMENT: RITU GAGLIARDO is a 49 y.o. female presenting today with chronic history of alternating soft and loose stool, some worsening recently, and likely dealing with IBS. She is declining stool studies or supportive measures at this time. Query metformin also contributing, as she notes some improvement when she misses dose. She is already scheduled for initial screening colonoscopy in near future with Propofol.   Hepatic steatosis: with mildly elevated ALT. Thus far, Hep B and C negative. No other LFT abnormalities. In setting of dyslipidemia and uncontrolled diabetes, we discussed progression of fatty liver disease and need for dietary and lifestyle changes, which she has already begun. We will follow HFP and recheck in 6 months. If continues to increase despite diet/behavior changes and glycemic control, will need to pursue further serologies.    PLAN:   Proceed with TCS with Dr. 44 in near future: the risks, benefits, and alternatives have been discussed with the patient in detail. The patient states understanding and desires to proceed.Propofol due to polypharmacy.  Recommend trial off metformin: defer to PCP  Recheck LFTs in 6 months  Strict glycemic control, diet/exericse/behavior modifications discussed.   Return in 6 months  Jena Gauss, PhD, [facility]

## 2019-06-05 NOTE — Progress Notes (Signed)
Cc'ed to pcp °

## 2019-06-24 ENCOUNTER — Other Ambulatory Visit: Payer: Self-pay | Admitting: Family

## 2019-06-24 ENCOUNTER — Other Ambulatory Visit: Payer: Self-pay | Admitting: Gastroenterology

## 2019-06-24 MED ORDER — DICYCLOMINE HCL 10 MG PO CAPS: 10.0000 mg | ORAL_CAPSULE | Freq: Three times a day (TID) | ORAL | 3 refills | Status: DC

## 2019-07-10 DIAGNOSIS — R233 Spontaneous ecchymoses: Secondary | ICD-10-CM | POA: Diagnosis not present

## 2019-07-10 DIAGNOSIS — L578 Other skin changes due to chronic exposure to nonionizing radiation: Secondary | ICD-10-CM | POA: Diagnosis not present

## 2019-07-10 DIAGNOSIS — L400 Psoriasis vulgaris: Secondary | ICD-10-CM | POA: Diagnosis not present

## 2019-07-15 ENCOUNTER — Other Ambulatory Visit: Payer: Self-pay | Admitting: Family

## 2019-07-15 MED ORDER — VITAMIN D (ERGOCALCIFEROL) 1.25 MG (50000 UNIT) PO CAPS: 50000.0000 [IU] | ORAL_CAPSULE | ORAL | 3 refills | Status: DC

## 2019-07-22 NOTE — Telephone Encounter (Signed)
Received request patient requesting to change procedure date. Instructions/pre-op/covid test mailed to patient

## 2019-07-23 ENCOUNTER — Other Ambulatory Visit: Payer: Self-pay | Admitting: Family

## 2019-07-29 DIAGNOSIS — Z111 Encounter for screening for respiratory tuberculosis: Secondary | ICD-10-CM | POA: Diagnosis not present

## 2019-07-29 DIAGNOSIS — L400 Psoriasis vulgaris: Secondary | ICD-10-CM | POA: Diagnosis not present

## 2019-08-01 ENCOUNTER — Other Ambulatory Visit (HOSPITAL_COMMUNITY): Payer: Medicaid Other

## 2019-08-01 ENCOUNTER — Encounter (HOSPITAL_COMMUNITY): Payer: Medicaid Other

## 2019-08-06 ENCOUNTER — Other Ambulatory Visit: Payer: Self-pay | Admitting: Family

## 2019-08-07 ENCOUNTER — Other Ambulatory Visit: Payer: Self-pay | Admitting: Family

## 2019-08-07 DIAGNOSIS — L400 Psoriasis vulgaris: Secondary | ICD-10-CM | POA: Diagnosis not present

## 2019-08-07 DIAGNOSIS — E785 Hyperlipidemia, unspecified: Secondary | ICD-10-CM | POA: Diagnosis not present

## 2019-08-07 DIAGNOSIS — E1142 Type 2 diabetes mellitus with diabetic polyneuropathy: Secondary | ICD-10-CM

## 2019-08-07 DIAGNOSIS — Z79899 Other long term (current) drug therapy: Secondary | ICD-10-CM | POA: Diagnosis not present

## 2019-08-12 ENCOUNTER — Ambulatory Visit: Payer: Medicaid Other | Admitting: Family

## 2019-08-13 ENCOUNTER — Encounter: Payer: Self-pay | Admitting: Family

## 2019-08-13 ENCOUNTER — Ambulatory Visit (INDEPENDENT_AMBULATORY_CARE_PROVIDER_SITE_OTHER): Payer: Medicaid Other | Admitting: Family

## 2019-08-13 DIAGNOSIS — R200 Anesthesia of skin: Secondary | ICD-10-CM

## 2019-08-13 NOTE — Progress Notes (Signed)
   Virtual Visit via telephone Note Due to COVID-19 pandemic this visit was conducted virtually. This visit type was conducted due to national recommendations for restrictions regarding the COVID-19 Pandemic (e.g. social distancing, sheltering in place) in an effort to limit this patient's exposure and mitigate transmission in our community. All issues noted in this document were discussed and addressed.  A physical exam was not performed with this format.  I connected with Sherry Glass on 08/13/19 at 8:46 AM by telephone and verified that I am speaking with the correct person using two identifiers. Sherry Glass is currently located at home and no one is currently with her during visit. The provider, Jannifer Rodney, [name] is located in their office at time of visit.  I discussed the limitations, risks, security and privacy concerns of performing an evaluation and management service by telephone and the availability of in person appointments. I also discussed with the patient that there may be a patient responsible charge related to this service. The patient expressed understanding and agreed to proceed.   History and Present Illness:  HPI  Pt calls the office today bilateral leg numbness that started over a year ago. However, it has worsen over the last month. She states if she stands 5-10 mins she starts having bilateral leg numbness that starts in her thigh down to her feet. When she states the numbness improves after sitting.   She takes gabapentin 300 mg TID and can not tell if this helps or not. She reports she has lost  15 lbs over the last 3 months with dieting hoping that would help, however it has not.   Review of Systems  All other systems reviewed and are negative.    Observations/Objective: No SOB or distress noted   Assessment and Plan: 1. Bilateral leg numbness Will order x-ray today May need MRI Continue gabapentin, she only takes 300 mg at 12 pm and then at bedtime because  it makes her sleepy. I will not increase because she does not want to be drowsy Continue weight loss  Keep good control of blood glucose  - DG Lumbar Spine 2-3 Views; Future    I discussed the assessment and treatment plan with the patient. The patient was provided an opportunity to ask questions and all were answered. The patient agreed with the plan and demonstrated an understanding of the instructions.   The patient was advised to call back or seek an in-person evaluation if the symptoms worsen or if the condition fails to improve as anticipated.  The above assessment and management plan was discussed with the patient. The patient verbalized understanding of and has agreed to the management plan. Patient is aware to call the clinic if symptoms persist or worsen. Patient is aware when to return to the clinic for a follow-up visit. Patient educated on when it is appropriate to go to the emergency department.   Time call ended:  8:59 AM  I provided 13 minutes of non-face-to-face time during this encounter.    Jannifer Rodney, [name]

## 2019-08-15 ENCOUNTER — Other Ambulatory Visit: Payer: Self-pay

## 2019-08-15 ENCOUNTER — Other Ambulatory Visit: Payer: Medicaid Other

## 2019-08-15 ENCOUNTER — Ambulatory Visit (INDEPENDENT_AMBULATORY_CARE_PROVIDER_SITE_OTHER): Payer: Medicaid Other

## 2019-08-15 DIAGNOSIS — R200 Anesthesia of skin: Secondary | ICD-10-CM | POA: Diagnosis not present

## 2019-08-15 DIAGNOSIS — M4726 Other spondylosis with radiculopathy, lumbar region: Secondary | ICD-10-CM | POA: Diagnosis not present

## 2019-08-20 ENCOUNTER — Other Ambulatory Visit: Payer: Self-pay | Admitting: Family

## 2019-08-20 DIAGNOSIS — R200 Anesthesia of skin: Secondary | ICD-10-CM

## 2019-09-02 ENCOUNTER — Telehealth: Payer: Self-pay | Admitting: *Deleted

## 2019-09-02 ENCOUNTER — Other Ambulatory Visit: Payer: Self-pay | Admitting: Family

## 2019-09-02 NOTE — Telephone Encounter (Signed)
09/02/2019 2:52 PM EDT General LM for patient to call back to set up appointment to see Christy. Billee Cashing S  Note Text:  LM for patient to call back to set up appointment to see [facility]    Date Time Type Summary User  09/02/2019 2:51 PM EDT Message Please schedule pt an appt to follow up on leg numbness. Insurance will not approve MRI until we have 6 week follow up.  Ronne Binning [facility] S  Note Text:  ----- Message ----- From: Junie Spencer, [name] Sent: 09/02/2019   2:13 PM EDT To: Fred[facility] Clinical [facility] A   Please schedule pt an appt to follow up on leg numbness. Insurance will not approve MRI until we have 6 week follow up.  ----- Message ----- From: Beatris Si Sent: 08/29/2019   8:24 AM EDT To: Junie Spencer, [name]  Good Morning ~ I received a fax this morning that Medicaid denied this request. The reasoning states that there was no 6 week treatment in the last 3 months with a follow up visit for re-evaluation of symptoms. If you would like a PEER to PEER regarding this you can call [phone] & it states it can be no longer than 5 days from 08/28/2019. You will need Patient's name, DOB,  Case ID: [id number], and Member ID # [id number] K.

## 2019-09-03 NOTE — Telephone Encounter (Signed)
Appointment scheduled for May 14th with Christy.

## 2019-09-04 ENCOUNTER — Other Ambulatory Visit: Payer: Self-pay | Admitting: Family

## 2019-09-04 DIAGNOSIS — M5416 Radiculopathy, lumbar region: Secondary | ICD-10-CM

## 2019-09-05 ENCOUNTER — Other Ambulatory Visit: Payer: Self-pay

## 2019-09-05 MED ORDER — SYNTHROID 112 MCG PO TABS: 112.0000 ug | ORAL_TABLET | Freq: Every day | ORAL | 0 refills | Status: DC

## 2019-09-06 ENCOUNTER — Telehealth: Payer: Self-pay | Admitting: "Endocrinology

## 2019-09-06 DIAGNOSIS — E890 Postprocedural hypothyroidism: Secondary | ICD-10-CM | POA: Diagnosis not present

## 2019-09-06 NOTE — Telephone Encounter (Signed)
Lab orders updated and sent. 

## 2019-09-06 NOTE — Telephone Encounter (Signed)
Can you update lab order °

## 2019-09-07 LAB — TSH: TSH: 0.03 mIU/L — ABNORMAL LOW

## 2019-09-07 LAB — T4, FREE: Free T4: 1.6 ng/dL (ref 0.8–1.8)

## 2019-09-09 ENCOUNTER — Ambulatory Visit: Payer: Medicaid Other | Admitting: "Endocrinology

## 2019-09-13 ENCOUNTER — Encounter: Payer: Self-pay | Admitting: Family

## 2019-09-13 ENCOUNTER — Other Ambulatory Visit: Payer: Self-pay

## 2019-09-13 ENCOUNTER — Ambulatory Visit (INDEPENDENT_AMBULATORY_CARE_PROVIDER_SITE_OTHER): Payer: Medicaid Other | Admitting: Family

## 2019-09-13 VITALS — BP 129/75 | HR 87 | Temp 97.5°F | Ht 64.0 in | Wt 209.8 lb

## 2019-09-13 DIAGNOSIS — G8929 Other chronic pain: Secondary | ICD-10-CM

## 2019-09-13 DIAGNOSIS — M5441 Lumbago with sciatica, right side: Secondary | ICD-10-CM

## 2019-09-13 DIAGNOSIS — M5442 Lumbago with sciatica, left side: Secondary | ICD-10-CM

## 2019-09-13 MED ORDER — LOSARTAN POTASSIUM 25 MG PO TABS: 25.0000 mg | ORAL_TABLET | Freq: Every day | ORAL | 1 refills | Status: DC

## 2019-09-13 MED ORDER — GABAPENTIN 100 MG PO CAPS: ORAL_CAPSULE | ORAL | 4 refills | Status: DC

## 2019-09-13 NOTE — Patient Instructions (Signed)
Sciatica Rehab Ask your health care provider which exercises are safe for you. Do exercises exactly as told by your health care provider and adjust them as directed. It is normal to feel mild stretching, pulling, tightness, or discomfort as you do these exercises. Stop right away if you feel sudden pain or your pain gets worse. Do not begin these exercises until told by your health care provider. Stretching and range-of-motion exercises These exercises warm up your muscles and joints and improve the movement and flexibility of your hips and back. These exercises also help to relieve pain, numbness, and tingling. Sciatic nerve glide 1. Sit in a chair with your head facing down toward your chest. Place your hands behind your back. Let your shoulders slump forward. 2. Slowly straighten one of your legs while you tilt your head back as if you are looking toward the ceiling. Only straighten your leg as far as you can without making your symptoms worse. 3. Hold this position for __________ seconds. 4. Slowly return to the starting position. 5. Repeat with your other leg. Repeat __________ times. Complete this exercise __________ times a day. Knee to chest with hip adduction and internal rotation  1. Lie on your back on a firm surface with both legs straight. 2. Bend one of your knees and move it up toward your chest until you feel a gentle stretch in your lower back and buttock. Then, move your knee toward the shoulder that is on the opposite side from your leg. This is hip adduction and internal rotation. ? Hold your leg in this position by holding on to the front of your knee. 3. Hold this position for __________ seconds. 4. Slowly return to the starting position. 5. Repeat with your other leg. Repeat __________ times. Complete this exercise __________ times a day. Prone extension on elbows  1. Lie on your abdomen on a firm surface. A bed may be too soft for this exercise. 2. Prop yourself up on  your elbows. 3. Use your arms to help lift your chest up until you feel a gentle stretch in your abdomen and your lower back. ? This will place some of your body weight on your elbows. If this is uncomfortable, try stacking pillows under your chest. ? Your hips should stay down, against the surface that you are lying on. Keep your hip and back muscles relaxed. 4. Hold this position for __________ seconds. 5. Slowly relax your upper body and return to the starting position. Repeat __________ times. Complete this exercise __________ times a day. Strengthening exercises These exercises build strength and endurance in your back. Endurance is the ability to use your muscles for a long time, even after they get tired. Pelvic tilt This exercise strengthens the muscles that lie deep in the abdomen. 1. Lie on your back on a firm surface. Bend your knees and keep your feet flat on the floor. 2. Tense your abdominal muscles. Tip your pelvis up toward the ceiling and flatten your lower back into the floor. ? To help with this exercise, you may place a small towel under your lower back and try to push your back into the towel. 3. Hold this position for __________ seconds. 4. Let your muscles relax completely before you repeat this exercise. Repeat __________ times. Complete this exercise __________ times a day. Alternating arm and leg raises  1. Get on your hands and knees on a firm surface. If you are on a hard floor, you may want to use   padding, such as an exercise mat, to cushion your knees. 2. Line up your arms and legs. Your hands should be directly below your shoulders, and your knees should be directly below your hips. 3. Lift your left leg behind you. At the same time, raise your right arm and straighten it in front of you. ? Do not lift your leg higher than your hip. ? Do not lift your arm higher than your shoulder. ? Keep your abdominal and back muscles tight. ? Keep your hips facing the  ground. ? Do not arch your back. ? Keep your balance carefully, and do not hold your breath. 4. Hold this position for __________ seconds. 5. Slowly return to the starting position. 6. Repeat with your right leg and your left arm. Repeat __________ times. Complete this exercise __________ times a day. Posture and body mechanics Good posture and healthy body mechanics can help to relieve stress in your body's tissues and joints. Body mechanics refers to the movements and positions of your body while you do your daily activities. Posture is part of body mechanics. Good posture means:  Your spine is in its natural S-curve position (neutral).  Your shoulders are pulled back slightly.  Your head is not tipped forward. Follow these guidelines to improve your posture and body mechanics in your everyday activities. Standing   When standing, keep your spine neutral and your feet about hip width apart. Keep a slight bend in your knees. Your ears, shoulders, and hips should line up.  When you do a task in which you stand in one place for a long time, place one foot up on a stable object that is 2-4 inches (5-10 cm) high, such as a footstool. This helps keep your spine neutral. Sitting   When sitting, keep your spine neutral and keep your feet flat on the floor. Use a footrest, if necessary, and keep your thighs parallel to the floor. Avoid rounding your shoulders, and avoid tilting your head forward.  When working at a desk or a computer, keep your desk at a height where your hands are slightly lower than your elbows. Slide your chair under your desk so you are close enough to maintain good posture.  When working at a computer, place your monitor at a height where you are looking straight ahead and you do not have to tilt your head forward or downward to look at the screen. Resting  When lying down and resting, avoid positions that are most painful for you.  If you have pain with activities  such as sitting, bending, stooping, or squatting, lie in a position in which your body does not bend very much. For example, avoid curling up on your side with your arms and knees near your chest (fetal position).  If you have pain with activities such as standing for a long time or reaching with your arms, lie with your spine in a neutral position and bend your knees slightly. Try the following positions: ? Lying on your side with a pillow between your knees. ? Lying on your back with a pillow under your knees. Lifting   When lifting objects, keep your feet at least shoulder width apart and tighten your abdominal muscles.  Bend your knees and hips and keep your spine neutral. It is important to lift using the strength of your legs, not your back. Do not lock your knees straight out.  Always ask for help to lift heavy or awkward objects. This information is not   intended to replace advice given to you by your health care provider. Make sure you discuss any questions you have with your health care provider. Document Revised: 08/10/2018 Document Reviewed: 05/10/2018 Elsevier Patient Education  2020 [employer]  

## 2019-09-13 NOTE — Progress Notes (Signed)
Subjective:    Patient ID: Sherry Glass, female    DOB: April 03, 1971, 49 y.o.   MRN: [id number]  Chief Complaint  Patient presents with  . Numbness    Right leg x 1 year has been getting worse since. Now it is almost every day.  . Back Pain    Right side has had xray no MRI has been done  . Hip Pain    Right side no PT has been ordered     HPI Pt presents to the office today for follow up on back pain  With  bilateral leg numbness and pain. She reports this started about year ago. She is currently taking Cymbalta 60 mg dialy and gabapentin 300 mg BID. She reports this helps slightly, but continues to have numbness and pain.   She reports intermittent burning, sharp pain of 8 out 10. Standing makes this worse.    Review of Systems  All other systems reviewed and are negative.      Objective:   Physical Exam Vitals reviewed.  Constitutional:      General: She is not in acute distress.    Appearance: She is well-developed.  HENT:     Head: Normocephalic and atraumatic.     Right Ear: Tympanic membrane normal.     Left Ear: Tympanic membrane normal.  Eyes:     Pupils: Pupils are equal, round, and reactive to light.  Neck:     Thyroid: No thyromegaly.  Cardiovascular:     Rate and Rhythm: Normal rate and regular rhythm.     Heart sounds: Normal heart sounds. No murmur.  Pulmonary:     Effort: Pulmonary effort is normal. No respiratory distress.     Breath sounds: Normal breath sounds. No wheezing.  Abdominal:     General: Bowel sounds are normal. There is no distension.     Palpations: Abdomen is soft.     Tenderness: There is no abdominal tenderness.  Musculoskeletal:        General: No tenderness. Normal range of motion.     Cervical back: Normal range of motion and neck supple.  Skin:    General: Skin is warm and dry.  Neurological:     Mental Status: She is alert and oriented to person, place, and time.     Cranial Nerves: No cranial nerve deficit.     Deep  Tendon Reflexes: Reflexes are normal and symmetric.  Psychiatric:        Behavior: Behavior normal.        Thought Content: Thought content normal.        Judgment: Judgment normal.     BP 129/75   Pulse 87   Temp (!) 97.5 F (36.4 C) (Temporal)   Ht 5\' 4"  (1.626 m)   Wt 209 lb 12.8 oz (95.2 kg)   SpO2 98%   BMI 36.01 kg/m        Assessment & Plan:  KORIN SETZLER comes in today with chief complaint of Numbness (Right leg x 1 year has been getting worse since. Now it is almost every day.), Back Pain (Right side has had xray no MRI has been done), and Hip Pain (Right side no PT has been ordered )   Diagnosis and orders addressed:  1. Chronic bilateral low back pain with bilateral sciatica Rest ROM exercises encouraged- Handout given  Will increase gabapentin  If MRI denied again will do referral to [facility] if symptoms worsen or do not  improve  - MR Lumbar Spine W Wo Contrast; Future - Ambulatory referral to Physical Therapy - gabapentin (NEURONTIN) 100 MG capsule; Take 300 mg in AM, 100 mg in afternoon, and 300 mg at night.  Dispense: 210 capsule; Refill: [place], [name]

## 2019-09-20 ENCOUNTER — Other Ambulatory Visit: Payer: Self-pay

## 2019-09-20 ENCOUNTER — Encounter: Payer: Self-pay | Admitting: Physical Therapy

## 2019-09-20 ENCOUNTER — Other Ambulatory Visit: Payer: Self-pay | Admitting: Family

## 2019-09-20 ENCOUNTER — Ambulatory Visit: Payer: Medicaid Other | Attending: Family | Admitting: Physical Therapy

## 2019-09-20 DIAGNOSIS — R262 Difficulty in walking, not elsewhere classified: Secondary | ICD-10-CM | POA: Insufficient documentation

## 2019-09-20 DIAGNOSIS — G8929 Other chronic pain: Secondary | ICD-10-CM

## 2019-09-20 DIAGNOSIS — M5441 Lumbago with sciatica, right side: Secondary | ICD-10-CM | POA: Diagnosis not present

## 2019-09-20 DIAGNOSIS — M6281 Muscle weakness (generalized): Secondary | ICD-10-CM

## 2019-09-20 DIAGNOSIS — E1165 Type 2 diabetes mellitus with hyperglycemia: Secondary | ICD-10-CM

## 2019-09-20 MED ORDER — BLOOD GLUCOSE MONITOR KIT: PACK | 0 refills | Status: DC

## 2019-09-20 NOTE — Patient Instructions (Signed)
Access Code: [email or web address] URL: [email or web address] Date: 09/20/2019 Prepared by: Narda Amber  Exercises Hooklying Hamstring Stretch with Strap - 2 x daily - 7 x weekly - 3 reps - 30 seconds hold Seated Hamstring Stretch - 2 x daily - 7 x weekly - 3 reps - 30 seconds hold Hooklying Single Knee to Chest Stretch - 2 x daily - 7 x weekly - 5 reps - 20 seconds hold

## 2019-09-20 NOTE — Therapy (Signed)
[facility] [address] [place], [place], [address] Phone: [phone]   Fax:  [phone]  Physical Therapy Evaluation  Patient Details  Name: Sherry Glass MRN: [id number] Date of Birth: 06/16/1970 Referring Provider (PT): Jannifer Rodney, [name]   Encounter Date: 09/20/2019  PT End of Session - 09/20/19 1001    Visit Number  1    Number of Visits  4    Date for PT Re-Evaluation  11/07/19    Authorization Type  MCD: applied for initial 3 visits on 09/20/2019    PT Start Time  0950    PT Stop Time  1035    PT Time Calculation (min)  45 min    Activity Tolerance  Patient tolerated treatment well    Behavior During Therapy  [facility] for tasks assessed/performed       Past Medical History:  Diagnosis Date  . Allergic rhinitis   . Anxiety and depression   . Cataract   . Chest pain syndrome   . COPD (chronic obstructive pulmonary disease) (HCC)   . Depression   . Diabetes mellitus without complication (HCC)   . History of kidney stones   . HTN (hypertension)   . Hypercholesterolemia   . Hyperglycemia   . Hypertension   . Obesity   . Restless leg syndrome   . Tobacco abuse     Past Surgical History:  Procedure Laterality Date  . ABDOMINAL HYSTERECTOMY    . CARDIAC CATHETERIZATION  2014  . CATARACT EXTRACTION W/PHACO Left 06/23/2016   Procedure: CATARACT EXTRACTION PHACO AND INTRAOCULAR LENS PLACEMENT (IOC);  Surgeon: Gemma Payor, MD;  Location: [facility];  Service: Ophthalmology;  Laterality: Left;  left - pt knows to arrive at 10:50 cde 9.11  . CESAREAN SECTION    . CHOLECYSTECTOMY    . EXCISION MASS NECK     cyst  . FOOT MASS EXCISION    . left foot surgery     removal of cyst  . NECK SURGERY    . TONSILLECTOMY      There were no vitals filed for this visit.   Subjective Assessment - 09/20/19 0954    Subjective  Pt arriving to therapy reporting low back pain that has progressively been getting worse over the last year. Pt  reporting constant 7-8/10 pain. Pt reporting worse pain is when standing. Pt reprorting or lying down can help but she constantly has to reposition.    Pertinent History  gall baldder, mass removed from foot and neck, hysterectomy, cataract surgery right eye    How long can you sit comfortably?  15 minutes    How long can you stand comfortably?  10 minutes    Diagnostic tests  X-ray:    Patient Stated Goals  Stop hurting    Currently in Pain?  Yes    Pain Score  8     Pain Location  Back    Pain Orientation  Right;Lower    Pain Type  Chronic pain    Pain Radiating Towards  down R LE all the way to the foot, pt reports when walking in [employer] within 15 minutes her R LE is numb    Pain Onset  More than a month ago    Aggravating Factors   standing, walking    Pain Relieving Factors  lying, changing positions    Effect of Pain on Daily Activities  difficulty with household chores, unable to tolerate community activites  [facility] PT Assessment - 09/20/19 0001      Assessment   Medical Diagnosis  Low back pain with R sided sciatica    Referring Provider (PT)  Evelina Dun, [name]    Hand Dominance  Right    Prior Therapy  yes for knee      Precautions   Precautions  None      Restrictions   Weight Bearing Restrictions  No      Balance Screen   Has the patient fallen in the past 6 months  No    Is the patient reluctant to leave their home because of a fear of falling?   No      Home Environment   Living Environment  Private residence    Living Arrangements  Children   son   Home Access  Ramped entrance      Prior Function   Level of Independence  Independent    Vocation  Other (comment)      Cognition   Overall Cognitive Status  Within Functional Limits for tasks assessed      Observation/Other Assessments   Focus on Therapeutic Outcomes (FOTO)   deferred due to MCD      Posture/Postural Control   Posture/Postural Control  Postural limitations    Postural  Limitations  Rounded Shoulders;Forward head;Increased lumbar lordosis;Increased thoracic kyphosis;Anterior pelvic tilt      ROM / Strength   AROM / PROM / Strength  AROM;Strength      AROM   AROM Assessment Site  Lumbar    Lumbar Flexion  60    Lumbar Extension  8    Lumbar - Right Side Bend  20    Lumbar - Left Side Bend  15    Lumbar - Right Rotation  limited 25% due to pain on R side    Lumbar - Left Rotation  limited 25 % due to pain on R side      Strength   Strength Assessment Site  Hip    Right/Left Hip  Right;Left    Right Hip Flexion  3+/5    Right Hip ABduction  4-/5    Right Hip ADduction  4-/5    Left Hip Flexion  4-/5    Left Hip ABduction  4-/5    Left Hip ADduction  4-/5      Palpation   Palpation comment  Extremely tender to palpaiton from L2-S1 bilateral lumbar paraspinals more sensitive on the R      Special Tests    Special Tests  Lumbar    Lumbar Tests  Slump Test;Straight Leg Raise      Slump test   Findings  Positive    Side  Right      Straight Leg Raise   Findings  Positive    Side   Right      Transfers   Five time sit to stand comments   22 seconds limited by pain using bilateral UE support      Ambulation/Gait   Gait Pattern  Step-through pattern;Decreased step length - right;Decreased step length - left;Decreased stride length;Decreased hip/knee flexion - right;Decreased hip/knee flexion - left;Antalgic;Poor foot clearance - left;Poor foot clearance - right                  Objective measurements completed on examination: See above findings.      [facility] Adult PT Treatment/Exercise - 09/20/19 0001      Exercises   Exercises  Lumbar  Lumbar Exercises: Stretches   Passive Hamstring Stretch  Right;Left;2 reps;30 seconds    Single Knee to Chest Stretch  Right;Left;3 reps;30 seconds             PT Education - 09/20/19 0959    Education Details  HEP, PT POC    Person(s) Educated  Patient    Methods   Explanation;Demonstration;Handout;Other (comment)    Comprehension  Verbalized understanding;Returned demonstration       PT Short Term Goals - 09/20/19 1053      PT SHORT TERM GOAL #1   Title  Patient will be independent with HEP    Baseline  no knowledge of exercises    Time  3    Period  Weeks    Status  New    Target Date  10/11/19      PT SHORT TERM GOAL #2   Title  -    Baseline  -        PT Long Term Goals - 09/20/19 1054      PT LONG TERM GOAL #1   Title  Patient will demonstrate improved lumbar flexion to be albe to list an 10# object from floor to counter height with pain </=3/10.    Baseline  60 degrees, pain with lifting 8/10    Time  8    Period  Weeks    Status  New    Target Date  11/15/19      PT LONG TERM GOAL #2   Title  Pt will be able to amb 20 minutes with pain </= 4/10 in order to go to [facility] and participate in community activities like grocery shopping.    Baseline  Pt reporting after 15 minutes pt has to rest and her R LE goes numb    Time  8    Period  Weeks    Status  New    Target Date  11/15/19      PT LONG TERM GOAL #3   Title  -    Baseline  -             Plan - 09/20/19 1015    Clinical Impression Statement  Pt presenting for PT evaluation of her LBP with R sided sciatica that has been worsening over the last year. Pt reports she has PT for her knee and feels that he doesn't think this can help. Pt was edu in basic anatomy and physiology about "sciatic nerve" pain and possible causes. Pt willing to give therapy a try. Pt presenting with bilateral hip weakness which is worse in R hip flexion of 3+/5, all other LE strength grossly 4-/5. Pt with positive SLR and slump test on the R. 2 trials of R LE long axis distraction were performed but pt reported no relief. I feel pt could benefit from beginning flexion based HEP and mechanical traction at next visits. Skilled PT needed to address pt's impairments.    Personal Factors and  Comorbidities  Comorbidity 3+    Comorbidities  DM, COPD, anxiety, HTN, depression, kidnesy stones, obesity, RLS, chest pain syndrome    Examination-Activity Limitations  Stairs;Stand;Lift;Sit    Examination-Participation Restrictions  Other;Community Activity    Stability/Clinical Decision Making  Evolving/Moderate complexity    Clinical Decision Making  Moderate    Rehab Potential  Fair    PT Frequency  2x / week    PT Duration  8 weeks    PT Treatment/Interventions  Cryotherapy;Taping;Ultrasound;Traction;Moist Heat;Electrical Stimulation;Gait training;Stair training;Functional mobility  training;Therapeutic activities;Therapeutic exercise;Balance training;Neuromuscular re-education;Passive range of motion;Dry needling;Manual techniques    PT Next Visit Plan  mechanical traction, hamstring stretches, piriformis stretches, SKTC, trunk rotation, modalities as needed, IASTM as tolerated    PT Home Exercise Plan  see pt instrucitons    Consulted and Agree with Plan of Care  Patient       Patient will benefit from skilled therapeutic intervention in order to improve the following deficits and impairments:  Pain, Postural dysfunction, Decreased activity tolerance, Decreased strength, Decreased range of motion, Impaired flexibility, Difficulty walking  Visit Diagnosis: Chronic bilateral low back pain with right-sided sciatica  Difficulty in walking, not elsewhere classified  Muscle weakness (generalized)     Problem List Patient Active Problem List   Diagnosis Date Noted  . Loose stools 06/04/2019  . Hepatic steatosis 05/23/2019  . Psoriasis 05/09/2019  . Encounter for screening colonoscopy 02/28/2019  . Depression, major, single episode, mild (HCC) 10/12/2018  . 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  . Nocturnal hypoxemia 05/29/2017  . GAD (generalized anxiety disorder) 02/13/2017  . Restrictive lung disease 01/27/2017  . Lung nodule < 6cm on CT 01/27/2017  .  GERD (gastroesophageal reflux disease) 11/18/2016  . Snoring 11/18/2016  . Witnessed episode of apnea 11/18/2016  . Chronic seasonal allergic rhinitis 11/18/2016  . Restless leg syndrome 11/18/2016  . Fibromyalgia 11/18/2016  . Hyperlipidemia associated with type 2 diabetes mellitus (HCC) 06/09/2016  . Vitamin D deficiency 05/16/2016  . Hyperthyroidism 05/16/2016  . Morbid obesity (HCC) 05/13/2016  . Uncontrolled type 2 diabetes mellitus with hyperglycemia (HCC) 05/13/2016  . COPD (chronic obstructive pulmonary disease) (HCC) 05/13/2016  . Chest pain syndrome   . Hypertension associated with diabetes (HCC)   . Stopped smoking with greater than 25 pack year history     Sharmon Leyden, PT, MPT 09/20/2019, 10:58 AM  [facility] [address] [place], [place], [address] Phone: [phone]   Fax:  [phone]  Name: TESSICA CUPO MRN: [id number] Date of Birth: Sep 07, 1970

## 2019-09-25 ENCOUNTER — Other Ambulatory Visit: Payer: Self-pay

## 2019-09-25 DIAGNOSIS — G8929 Other chronic pain: Secondary | ICD-10-CM

## 2019-09-25 DIAGNOSIS — M5441 Lumbago with sciatica, right side: Secondary | ICD-10-CM

## 2019-09-26 ENCOUNTER — Encounter: Payer: Self-pay | Admitting: "Endocrinology

## 2019-09-26 ENCOUNTER — Other Ambulatory Visit: Payer: Self-pay

## 2019-09-26 ENCOUNTER — Ambulatory Visit (INDEPENDENT_AMBULATORY_CARE_PROVIDER_SITE_OTHER): Payer: Medicaid Other | Admitting: "Endocrinology

## 2019-09-26 VITALS — BP 127/77 | HR 86 | Ht 64.0 in | Wt 211.4 lb

## 2019-09-26 DIAGNOSIS — E890 Postprocedural hypothyroidism: Secondary | ICD-10-CM

## 2019-09-26 MED ORDER — LEVOTHYROXINE SODIUM 100 MCG PO TABS: 100.0000 ug | ORAL_TABLET | Freq: Every day | ORAL | 1 refills | Status: DC

## 2019-09-26 NOTE — Progress Notes (Signed)
09/26/2019   Endocrinology follow-up note   Subjective:    Patient ID: Sherry Glass, female    DOB: [birth year], PCP Sharion Balloon, [name].   Past Medical History:  Diagnosis Date  . Allergic rhinitis   . Anxiety and depression   . Cataract   . Chest pain syndrome   . COPD (chronic obstructive pulmonary disease) ([place])   . Depression   . Diabetes mellitus without complication ([place])   . History of kidney stones   . HTN (hypertension)   . Hypercholesterolemia   . Hyperglycemia   . Hypertension   . Obesity   . Restless leg syndrome   . Tobacco abuse    Past Surgical History:  Procedure Laterality Date  . ABDOMINAL HYSTERECTOMY    . CARDIAC CATHETERIZATION  2014  . CATARACT EXTRACTION W/PHACO Left 06/23/2016   Procedure: CATARACT EXTRACTION PHACO AND INTRAOCULAR LENS PLACEMENT (IOC);  Surgeon: Tonny Branch, MD;  Location: [facility];  Service: Ophthalmology;  Laterality: Left;  left - pt knows to arrive at 10:50 cde 9.11  . CESAREAN SECTION    . CHOLECYSTECTOMY    . EXCISION MASS NECK     cyst  . FOOT MASS EXCISION    . left foot surgery     removal of cyst  . NECK SURGERY    . TONSILLECTOMY     Social History   Socioeconomic History  . Marital status: Widowed    Spouse name: Not on file  . Number of children: Not on file  . Years of education: Not on file  . Highest education level: Not on file  Occupational History  . Not on file  Tobacco Use  . Smoking status: Former Smoker    Packs/day: 1.00    Years: 25.00    Pack years: 25.00    Types: Cigarettes    Start date: 11/13/1988    Quit date: 10/28/2016    Years since quitting: 2.9  . Smokeless tobacco: Never Used  Substance and Sexual Activity  . Alcohol use: Yes    Comment: For special occasions, very rare   . Drug use: No  . Sexual activity: Never    Birth control/protection: Surgical  Other Topics Concern  . Not on file  Social History Narrative   [facility] (11/18/16):   Originally from [place] but  grew up in [place]. Cared for her husband until he passed. 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 Does have significant dust exposure. Has 3 dogs currently. Previously had a parakeet as a child. No mold exposure. Enjoys playing with her grandchildren.        Social Determinants of Health   Financial Resource Strain:   . Difficulty of Paying Living Expenses:   Food Insecurity:   . Worried About Charity fundraiser in the Last Year:   . Arboriculturist in the Last Year:   Transportation Needs:   . Film/video editor (Medical):   Marland Kitchen Lack of Transportation (Non-Medical):   Physical Activity:   . Days of Exercise per Week:   . Minutes of Exercise per Session:   Stress:   . Feeling of Stress :   Social Connections:   . Frequency of Communication with Friends and Family:   . Frequency of Social Gatherings with Friends and Family:   . Attends Religious Services:   . Active Member of Clubs or Organizations:   . Attends Archivist Meetings:   .  Marital Status:    Outpatient Encounter Medications as of 09/26/2019  Medication Sig  . ACCU-CHEK FASTCLIX LANCETS MISC USE TO check blood glucose twice daily  . acetaminophen (TYLENOL) 500 MG tablet Take 500 mg by mouth every 6 (six) hours as needed.  Marland Kitchen albuterol (PROVENTIL) (2.5 MG/3ML) 0.083% nebulizer solution Use 1 vial (3 mL) via nebulizer every 6 hours as needed for wheezing or shortness of breath  . aspirin EC 81 MG tablet Take one po qd  . atorvastatin (LIPITOR) 20 MG tablet Take 1 tablet (20 mg total) by mouth daily.  . blood glucose meter kit and supplies KIT Dispense based on patient and insurance preference. Use up to four times daily as directed. (FOR ICD-9 250.00, 250.01).  . budesonide (PULMICORT) 0.5 MG/2ML nebulizer solution Take 2 mLs (0.5 mg total) 2 (two) times daily by nebulization.  . cyclobenzaprine (FLEXERIL) 10 MG tablet Take 1 tablet by mouth three times daily as  needed for muscle spasm  . dicyclomine (BENTYL) 10 MG capsule Take 1 capsule (10 mg total) by mouth 4 (four) times daily -  before meals and at bedtime. For cramping and loose stool. Do not take if constipated  . DOVONEX 0.005 % cream Apply topically 2 (two) times daily.  . DULoxetine (CYMBALTA) 60 MG capsule Take 1 capsule (60 mg total) by mouth daily.  . empagliflozin (JARDIANCE) 10 MG TABS tablet Take 10 mg by mouth daily before breakfast.  . formoterol (PERFOROMIST) 20 MCG/2ML nebulizer solution Take 2 mLs (20 mcg total) by nebulization 2 (two) times daily.  Marland Kitchen gabapentin (NEURONTIN) 100 MG capsule Take 300 mg in AM, 100 mg in afternoon, and 300 mg at night.  Marland Kitchen glimepiride (AMARYL) 2 MG tablet Take 1 tablet by mouth once daily with breakfast  . glucose blood (ACCU-CHEK GUIDE) test strip USE TO check blood glucose twice daily  . levothyroxine (SYNTHROID) 100 MCG tablet Take 1 tablet (100 mcg total) by mouth daily before breakfast.  . losartan (COZAAR) 25 MG tablet Take 1 tablet (25 mg total) by mouth daily.  . metFORMIN (GLUCOPHAGE) 1000 MG tablet TAKE 1 TABLET BY MOUTH TWICE DAILY WITH MEALS  . nitroGLYCERIN (NITROSTAT) 0.4 MG SL tablet Place 1 tablet (0.4 mg total) under the tongue every 5 (five) minutes as needed for chest pain.  Marland Kitchen propranolol (INDERAL) 20 MG tablet Take 1 tablet (20 mg total) by mouth 2 (two) times daily.  Marland Kitchen STIOLTO RESPIMAT 2.5-2.5 MCG/ACT AERS INHALE 2 PUFFS BY MOUTH ONCE DAILY  . Vitamin D, Ergocalciferol, (DRISDOL) 1.25 MG (50000 UNIT) CAPS capsule Take 1 capsule (50,000 Units total) by mouth every 7 (seven) days.  . [DISCONTINUED] SYNTHROID 112 MCG tablet Take 1 tablet (112 mcg total) by mouth daily before breakfast.   No facility-administered encounter medications on file as of 09/26/2019.   ALLERGIES: Allergies  Allergen Reactions  . Dilaudid [Hydromorphone]     Itching and vomitting  . Hydromorphone   . Valproic Acid And Related    VACCINATION  STATUS: Immunization History  Administered Date(s) Administered  . Influenza,inj,Quad PF,6+ Mos 04/27/2017  . Pneumococcal Polysaccharide-23 05/02/2005    HPI  Mrs. Petras is status post radioactive iodine thyroid ablation on June 29, 2018.  She developed hypothyroidism, and was started on low-dose levothyroxine , currently 100 mcg p.o. daily before breakfast.  She reports compliance to medication, has no new complaints.  She has lost 10 pounds since last visit.  Her previsit thyroid function tests are consistent with slight over replacement.  The patient denies family history of thyroid dysfunction- she is adopted. -She denies palpitations, tremors, nor heat intolerance. - She has well-controlled type 2 diabetes with A1c of 7.7%, following with her PMD, taking Metformin, glimepiride, and Jardiance.    - She reports that she has quit smoking after decades of heavy smoking.     Review of Systems. Limited as above   Objective:    BP 127/77   Pulse 86   Ht '5\' 4"'  (1.626 m)   Wt 211 lb 6.4 oz (95.9 kg)   BMI 36.29 kg/m   Wt Readings from Last 3 Encounters:  09/26/19 211 lb 6.4 oz (95.9 kg)  09/13/19 209 lb 12.8 oz (95.2 kg)  06/04/19 219 lb 3.2 oz (99.4 kg)    Physical Exam   Lipid Panel     Component Value Date/Time   CHOL 177 05/09/2019 1201   TRIG 275 (H) 05/09/2019 1201   HDL 49 05/09/2019 1201   CHOLHDL 3.6 05/09/2019 1201   LDLCALC 83 05/09/2019 1201   Recent Results (from the past 2160 hour(s))  TSH     Status: Abnormal   Collection Time: 09/06/19  1:45 PM  Result Value Ref Range   TSH 0.03 (L) mIU/L    Comment:           Reference Range .           > or = 20 Years  0.40-4.50 .                Pregnancy Ranges           First trimester    0.26-2.66           Second trimester   0.55-2.73           Third trimester    0.43-2.91   T4, free     Status: None   Collection Time: 09/06/19  1:45 PM  Result Value Ref Range   Free T4 1.6 0.8 - 1.8 ng/dL      - Thyroid uptake and scan on 07/19/2016 was nonfocal with 25% uptake and 24 hours.  March 05, 2018 thyroid uptake and scan: FINDINGS: Normal thyroid scan.  No hot or cold nodules are identified.  4 hour I-131 uptake = 17.6% (normal 5-20%) 24 hour I-131 uptake = 30.0% (normal 10-30%)  IMPRESSION: 1. Normal thyroid scan. 2. Top-normal 24 hour iodine 131 uptake at 30%.  I-131 thyroid ablation on June 29, 2018.    Assessment & Plan:   1.  RAI induced hypothyroidism  2.  Type 2 diabetes -She is status post thyroid ablation with I-131 for significant T3 toxicosis.  Treatment was administered on June 29, 2018.   -Her previsit thyroid function tests are consistent with slight over replacement.  I discussed and lowered her levothyroxine to 100 mcg p.o. daily before breakfast.    - We discussed about the correct intake of her thyroid hormone, on empty stomach at fasting, with water, separated by at least 30 minutes from breakfast and other medications,  and separated by more than 4 hours from calcium, iron, multivitamins, acid reflux medications (PPIs). -Patient is made aware of the fact that thyroid hormone replacement is needed for life, dose to be adjusted by periodic monitoring of thyroid function tests.  She has type 2 diabetes with recent A1c of 7.7%. She is advised to continue metformin 1000 mg p.o. twice daily and follow-up with her PCP.  In the interim Jardiance was added to her regimen  by her PMD.  This patient is considered high risk for side effects of this medication, she will be considered for several options instead of Jardiance.  She wishes to  discuss this care with her PMD during her next visit in 1 month.  - I advised patient to maintain close follow up with Sharion Balloon, [name] for primary care needs, including diabetes care.      - Time spent on this patient care encounter:  20 minutes of which 50% was spent in  counseling and the rest reviewing  her  current and  previous labs / studies and medications  doses and developing a plan for long term care. Seward Meth  participated in the discussions, expressed understanding, and voiced agreement with the above plans.  All questions were answered to her satisfaction. she is encouraged to contact clinic should she have any questions or concerns prior to her return visit.   Follow up plan: Return in about 6 months (around 03/28/2020) for F/U with Pre-visit Labs.  Glade Lloyd, MD Phone: [phone]  Fax: [phone]   09/26/2019, 5:19 PM

## 2019-10-08 NOTE — Patient Instructions (Signed)
Sherry Glass  10/08/2019     @PREFPERIOPPHARMACY @   Your procedure is scheduled on 10/14/2019  Report to [facility] at  0700  A.M.  Call this number if you have problems the morning of surgery:  [phone]   Remember:  Follow the diet and prep instructions given to you by Dr [phone] office.                  Take these medicines the morning of surgery with A SIP OF WATER  Flexeril(if needed), gabapentin, levothyroxine, propranolol.    Do not wear jewelry, make-up or nail polish.  Do not wear lotions, powders, or perfumes. Please wear deodorant and brush your teeth.  Do not shave 48 hours prior to surgery.  Men may shave face and neck.  Do not bring valuables to the hospital.  [facility] is not responsible for any belongings or valuables.  Contacts, dentures or bridgework may not be worn into surgery.  Leave your suitcase in the car.  After surgery it may be brought to your room.  For patients admitted to the hospital, discharge time will be determined by your treatment team.  Patients discharged the day of surgery will not be allowed to drive home.   Name and phone number of your driver:   family Special instructions:  DO NOT smoke the morning of your procedure.  Please read over the following fact sheets that you were given. Anesthesia Post-op Instructions and Care and Recovery After Surgery       Colonoscopy, Adult, Care After This sheet gives you information about how to care for yourself after your procedure. Your health care provider may also give you more specific instructions. If you have problems or questions, contact your health care provider. What can I expect after the procedure? After the procedure, it is common to have:  A small amount of blood in your stool for 24 hours after the procedure.  Some gas.  Mild cramping or bloating of your abdomen. Follow these instructions at home: Eating and drinking   Drink enough fluid to keep your urine  pale yellow.  Follow instructions from your health care provider about eating or drinking restrictions.  Resume your normal diet as instructed by your health care provider. Avoid heavy or fried foods that are hard to digest. Activity  Rest as told by your health care provider.  Avoid sitting for a long time without moving. Get up to take short walks every 1-2 hours. This is important to improve blood flow and breathing. Ask for help if you feel weak or unsteady.  Return to your normal activities as told by your health care provider. Ask your health care provider what activities are safe for you. Managing cramping and bloating   Try walking around when you have cramps or feel bloated.  Apply heat to your abdomen as told by your health care provider. Use the heat source that your health care provider recommends, such as a moist heat pack or a heating pad. ? Place a towel between your skin and the heat source. ? Leave the heat on for 20-30 minutes. ? Remove the heat if your skin turns bright red. This is especially important if you are unable to feel pain, heat, or cold. You may have a greater risk of getting burned. General instructions  For the first 24 hours after the procedure: ? Do not drive or use machinery. ? Do not sign important documents. ?  Do not drink alcohol. ? Do your regular daily activities at a slower pace than normal. ? Eat soft foods that are easy to digest.  Take over-the-counter and prescription medicines only as told by your health care provider.  Keep all follow-up visits as told by your health care provider. This is important. Contact a health care provider if:  You have blood in your stool 2-3 days after the procedure. Get help right away if you have:  More than a small spotting of blood in your stool.  Large blood clots in your stool.  Swelling of your abdomen.  Nausea or vomiting.  A fever.  Increasing pain in your abdomen that is not relieved  with medicine. Summary  After the procedure, it is common to have a small amount of blood in your stool. You may also have mild cramping and bloating of your abdomen.  For the first 24 hours after the procedure, do not drive or use machinery, sign important documents, or drink alcohol.  Get help right away if you have a lot of blood in your stool, nausea or vomiting, a fever, or increased pain in your abdomen. This information is not intended to replace advice given to you by your health care provider. Make sure you discuss any questions you have with your health care provider. Document Revised: 11/12/2018 Document Reviewed: 11/12/2018 Elsevier Patient Education  [place] After These instructions provide you with information about caring for yourself after your procedure. Your health care provider may also give you more specific instructions. Your treatment has been planned according to current medical practices, but problems sometimes occur. Call your health care provider if you have any problems or questions after your procedure. What can I expect after the procedure? After your procedure, you may:  Feel sleepy for several hours.  Feel clumsy and have poor balance for several hours.  Feel forgetful about what happened after the procedure.  Have poor judgment for several hours.  Feel nauseous or vomit.  Have a sore throat if you had a breathing tube during the procedure. Follow these instructions at home: For at least 24 hours after the procedure:      Have a responsible adult stay with you. It is important to have someone help care for you until you are awake and alert.  Rest as needed.  Do not: ? Participate in activities in which you could fall or become injured. ? Drive. ? Use heavy machinery. ? Drink alcohol. ? Take sleeping pills or medicines that cause drowsiness. ? Make important decisions or sign legal documents. ? Take  care of children on your own. Eating and drinking  Follow the diet that is recommended by your health care provider.  If you vomit, drink water, juice, or soup when you can drink without vomiting.  Make sure you have little or no nausea before eating solid foods. General instructions  Take over-the-counter and prescription medicines only as told by your health care provider.  If you have sleep apnea, surgery and certain medicines can increase your risk for breathing problems. Follow instructions from your health care provider about wearing your sleep device: ? Anytime you are sleeping, including during daytime naps. ? While taking prescription pain medicines, sleeping medicines, or medicines that make you drowsy.  If you smoke, do not smoke without supervision.  Keep all follow-up visits as told by your health care provider. This is important. Contact a health care provider if:  You keep  feeling nauseous or you keep vomiting.  You feel light-headed.  You develop a rash.  You have a fever. Get help right away if:  You have trouble breathing. Summary  For several hours after your procedure, you may feel sleepy and have poor judgment.  Have a responsible adult stay with you for at least 24 hours or until you are awake and alert. This information is not intended to replace advice given to you by your health care provider. Make sure you discuss any questions you have with your health care provider. Document Revised: 07/17/2017 Document Reviewed: 08/09/2015 Elsevier Patient Education  [place].

## 2019-10-09 ENCOUNTER — Ambulatory Visit: Payer: Medicaid Other | Attending: Family | Admitting: Physical Therapy

## 2019-10-09 ENCOUNTER — Other Ambulatory Visit: Payer: Self-pay

## 2019-10-09 DIAGNOSIS — M25561 Pain in right knee: Secondary | ICD-10-CM

## 2019-10-09 DIAGNOSIS — R262 Difficulty in walking, not elsewhere classified: Secondary | ICD-10-CM | POA: Diagnosis not present

## 2019-10-09 DIAGNOSIS — M6281 Muscle weakness (generalized): Secondary | ICD-10-CM | POA: Diagnosis not present

## 2019-10-09 DIAGNOSIS — G8929 Other chronic pain: Secondary | ICD-10-CM | POA: Diagnosis not present

## 2019-10-09 DIAGNOSIS — M5441 Lumbago with sciatica, right side: Secondary | ICD-10-CM | POA: Insufficient documentation

## 2019-10-09 NOTE — Therapy (Signed)
[facility] [place], [place], [address] Phone: [phone]   Fax:  [phone]  Physical Therapy Treatment  Patient Details  Name: Sherry Glass MRN: [id number] Date of Birth: 12-01-1970 Referring Provider (PT): Evelina Dun, [name]   Encounter Date: 10/09/2019  PT End of Session - 10/09/19 0940    Visit Number  2    Number of Visits  4    Date for PT Re-Evaluation  11/07/19    Authorization Type  MCD: applied for initial 3 visits on 09/20/2019    PT Start Time  0901    PT Stop Time  0944    PT Time Calculation (min)  43 min    Activity Tolerance  Patient tolerated treatment well    Behavior During Therapy  [facility] for tasks assessed/performed       Past Medical History:  Diagnosis Date  . Allergic rhinitis   . Anxiety and depression   . Cataract   . Chest pain syndrome   . COPD (chronic obstructive pulmonary disease) ([place])   . Depression   . Diabetes mellitus without complication ([place])   . History of kidney stones   . HTN (hypertension)   . Hypercholesterolemia   . Hyperglycemia   . Hypertension   . Obesity   . Restless leg syndrome   . Tobacco abuse     Past Surgical History:  Procedure Laterality Date  . ABDOMINAL HYSTERECTOMY    . CARDIAC CATHETERIZATION  2014  . CATARACT EXTRACTION W/PHACO Left 06/23/2016   Procedure: CATARACT EXTRACTION PHACO AND INTRAOCULAR LENS PLACEMENT (IOC);  Surgeon: Tonny Branch, MD;  Location: [facility];  Service: Ophthalmology;  Laterality: Left;  left - pt knows to arrive at 10:50 cde 9.11  . CESAREAN SECTION    . CHOLECYSTECTOMY    . EXCISION MASS NECK     cyst  . FOOT MASS EXCISION    . left foot surgery     removal of cyst  . NECK SURGERY    . TONSILLECTOMY      There were no vitals filed for this visit.  Subjective Assessment - 10/09/19 0906    Subjective  COVID-19 screening performed upon arrival. Patient arrived with ongoing pain in right low back and LE    Pertinent  History  gall baldder, mass removed from foot and neck, hysterectomy, cataract surgery right eye    How long can you sit comfortably?  15 minutes    How long can you stand comfortably?  10 minutes    Diagnostic tests  X-ray:    Patient Stated Goals  Stop hurting    Currently in Pain?  Yes    Pain Score  7     Pain Location  Back    Pain Orientation  Right    Pain Descriptors / Indicators  Constant    Pain Type  Chronic pain    Pain Onset  More than a month ago    Pain Frequency  Constant    Aggravating Factors   standing/walking    Pain Relieving Factors  rest laying down                        [facility] Adult PT Treatment/Exercise - 10/09/19 0001      Lumbar Exercises: Stretches   Piriformis Stretch  Right;20 seconds;3 reps   sitting     Lumbar Exercises: Seated   Other Seated Lumbar Exercises  seated core activation  heel  lift 2x10, toe lift 2x10, marching 2x10, ball squeeze x20, clam w yellow band x20      Lumbar Exercises: Supine   Ab Set  20 reps    Glut Set  20 reps    Clam  20 reps    Bent Knee Raise  3 seconds   2x10     Modalities   Modalities  Electrical Stimulation      Electrical Stimulation   Electrical Stimulation Location  Rt low back    Electrical Stimulation Action  premod     Electrical Stimulation Parameters  80-'150hz'  x46mn    Electrical Stimulation Goals  Pain             PT Education - 10/09/19 [phone]   Education Details  HEP and posture awarness techniques    Person(s) Educated  Patient    Methods  Explanation;Demonstration;Handout    Comprehension  Verbalized understanding;Returned demonstration       PT Short Term Goals - 10/09/19 0914      PT SHORT TERM GOAL #1   Title  Patient will be independent with HEP    Baseline  met 10/09/19    Time  3    Period  Weeks    Status  Achieved        PT Long Term Goals - 10/09/19 0916      PT LONG TERM GOAL #1   Title  Patient will demonstrate improved lumbar flexion to be  albe to list an 10# object from floor to counter height with pain </=3/10.    Baseline  60 degrees, pain with lifting 8/10    Time  8    Period  Weeks    Status  On-going   unable to lift any heavy objects 10/09/19     PT LONG TERM GOAL #2   Title  Pt will be able to amb 20 minutes with pain </= 4/10 [facility] like [facility].    Baseline  533m only before pain and symptoms 6/21    Time  8    Period  Weeks    Status  On-going            Plan - 10/09/19 [id number]  Clinical Impression Statement  Patient arrived with ongoing pain in right low back and Rt LE up to 7/10 pain. Patient was educated on posture awareness techniques and core activation progression with HEP provided. Patient able to complete all activities today and good understanding of techniques. Patient met STG #1 with others ongoing due to pain limitations. Patient unable to lift heavy objects or perform any prolong standing or walking at this time. Normal response upon removal of modality today.    Personal Factors and Comorbidities  Comorbidity 3+    Comorbidities  DM, COPD, anxiety, HTN, depression, kidnesy stones, obesity, RLS, chest pain syndrome    Examination-Activity Limitations  Stairs;Stand;Lift;Sit    Examination-Participation Restrictions  Other;Community Activity    Stability/Clinical Decision Making  Evolving/Moderate complexity    Rehab Potential  Fair    PT Frequency  2x / week    PT Duration  8 weeks    PT Treatment/Interventions  Cryotherapy;Taping;Ultrasound;Traction;Moist Heat;Electrical Stimulation;Gait training;Stair training;Functional mobility training;Therapeutic activities;Therapeutic exercise;Balance training;Neuromuscular re-education;Passive range of motion;Dry needling;Manual techniques    PT Next Visit Plan  cont with POC for mechanical traction, hamstring stretches, piriformis stretches, SKTC, trunk rotation, modalities as needed, IASTM  as tolerated  Consulted and Agree with Plan of Care  Patient       Patient will benefit from skilled therapeutic intervention in order to improve the following deficits and impairments:  Pain, Postural dysfunction, Decreased activity tolerance, Decreased strength, Decreased range of motion, Impaired flexibility, Difficulty walking  Visit Diagnosis: Chronic bilateral low back pain with right-sided sciatica  Difficulty in walking, not elsewhere classified  Muscle weakness (generalized)  Acute pain of right knee     Problem List Patient Active Problem List   Diagnosis Date Noted  . Loose stools 06/04/2019  . Hepatic steatosis 05/23/2019  . Psoriasis 05/09/2019  . Encounter for screening colonoscopy 02/28/2019  . Depression, major, single episode, mild ([place]) 10/12/2018  . 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  . Nocturnal hypoxemia 05/29/2017  . GAD (generalized anxiety disorder) 02/13/2017  . Restrictive lung disease 01/27/2017  . Lung nodule < 6cm on CT 01/27/2017  . GERD (gastroesophageal reflux disease) 11/18/2016  . Snoring 11/18/2016  . Witnessed episode of apnea 11/18/2016  . Chronic seasonal allergic rhinitis 11/18/2016  . Restless leg syndrome 11/18/2016  . Fibromyalgia 11/18/2016  . Hyperlipidemia associated with type 2 diabetes mellitus ([place]) 06/09/2016  . Vitamin D deficiency 05/16/2016  . Hyperthyroidism 05/16/2016  . Morbid obesity ([place]) 05/13/2016  . Uncontrolled type 2 diabetes mellitus with hyperglycemia ([place]) 05/13/2016  . COPD (chronic obstructive pulmonary disease) ([place]) 05/13/2016  . Chest pain syndrome   . Hypertension associated with diabetes ([place])   . Stopped smoking with greater than 25 pack year history     Sukhdeep Wieting P, PTA 10/09/2019, 9:50 AM  [facility] [place], [place], [address] Phone: [phone]   Fax:  [phone]  Name: Sherry Glass MRN:  [id number] Date of Birth: March 01, 1971

## 2019-10-09 NOTE — Patient Instructions (Signed)
Pelvic Tilt: Posterior - Legs Bent (Supine)   Tighten stomach and flatten back by rolling pelvis down. Hold _10___ seconds. Relax. Repeat _10-30___ times per set. Do __2__ sets per session. Do _2___ sessions per day.   Bent Leg Lift (Hook-Lying)   Tighten stomach and slowly raise right leg _5___ inches from floor. Keep trunk rigid. Hold _3___ seconds. Repeat _10___ times per set. Do ___2-3_ sets per session. Do __2__ sessions per day.   Heel Raise (Sitting)    Raise heels, keeping toes on floor.  Tighten abdomin and buttock (draw in) Repeat __10__ times per set. Do __1-2__ sets per session. Do __1-2__ sessions per day.   FLEXION: Sitting (Active)    Sit, both feet flat. Lift right knee toward ceiling. Tighten abdomin and buttock (draw in) Complete _10__ sets of _1-2__ repetitions. Perform _1-2__ sessions per day.     Brushing Teeth    Place one foot on ledge and one hand on counter. Bend other knee slightly to keep back straight.  Copyright  VHI. All rights reserved.  Refrigerator   Squat with knees apart to reach lower shelves and drawers.   Copyright  VHI. All rights reserved.  Laundry [employer] down and hold basket close to stand. Use leg muscles to do the work.   Copyright  VHI. All rights reserved.  Housework - Vacuuming   Hold the vacuum with arm held at side. Step back and forth to move it, keeping head up. Avoid twisting.   Copyright  VHI. All rights reserved.  Housework - Wiping   Position yourself as close as possible to reach work surface. Avoid straining your back.   Copyright  VHI. All rights reserved.  Gardening - Mowing   Keep arms close to sides and walk with lawn mower.   Copyright  VHI. All rights reserved.  Sleeping on Side   Place pillow between knees. Use cervical support under neck and a roll around waist as needed.   Copyright  VHI. All rights reserved.  Log Roll   Lying on back, bend left knee and  place left arm across chest. Roll all in one movement to the right. Reverse to roll to the left. Always move as one unit.   Copyright  VHI. All rights reserved.  Stand to Sit / Sit to Stand   To sit: Bend knees to lower self onto front edge of chair, then scoot back on seat. To stand: Reverse sequence by placing one foot forward, and scoot to front of seat. Use rocking motion to stand up.  Copyright  VHI. All rights reserved.  Posture - Standing   Good posture is important. Avoid slouching and forward head thrust. Maintain curve in low back and align ears over shoul- ders, hips over ankles.   Copyright  VHI. All rights reserved.  Posture - Sitting   Sit upright, head facing forward. Try using a roll to support lower back. Keep shoulders relaxed, and avoid rounded back. Keep hips level with knees. Avoid crossing legs for long periods.   Copyright  VHI. All rights reserved.  Computer Work   Position work to Art gallery manager. Use proper work and seat height. Keep shoulders back and down, wrists straight, and elbows at right angles. Use chair that provides full back support. Add footrest and lumbar roll as needed.   Copyright  VHI. All rights reserved.

## 2019-10-10 ENCOUNTER — Other Ambulatory Visit (HOSPITAL_COMMUNITY)
Admission: RE | Admit: 2019-10-10 | Discharge: 2019-10-10 | Disposition: A | Discharge status: Home / Self Care | Payer: Medicaid Other | Source: Ambulatory Visit | Attending: Internal Medicine | Admitting: Internal Medicine

## 2019-10-10 ENCOUNTER — Encounter (HOSPITAL_COMMUNITY)
Admission: RE | Admit: 2019-10-10 | Discharge: 2019-10-10 | Disposition: A | Discharge status: Home / Self Care | Payer: Medicaid Other | Source: Ambulatory Visit | Attending: Internal Medicine | Admitting: Internal Medicine

## 2019-10-10 ENCOUNTER — Encounter (HOSPITAL_COMMUNITY): Payer: Self-pay

## 2019-10-10 ENCOUNTER — Other Ambulatory Visit: Payer: Self-pay

## 2019-10-10 DIAGNOSIS — Z20822 Contact with and (suspected) exposure to covid-19: Secondary | ICD-10-CM | POA: Diagnosis not present

## 2019-10-10 DIAGNOSIS — Z01812 Encounter for preprocedural laboratory examination: Secondary | ICD-10-CM | POA: Insufficient documentation

## 2019-10-10 HISTORY — DX: Hypothyroidism, unspecified: E03.9

## 2019-10-10 LAB — BASIC METABOLIC PANEL
Anion gap: 10 (ref 5–15)
BUN: 12 mg/dL (ref 6–20)
CO2: 22 mmol/L (ref 22–32)
Calcium: 9.5 mg/dL (ref 8.9–10.3)
Chloride: 104 mmol/L (ref 98–111)
Creatinine, Ser: 0.48 mg/dL (ref 0.44–1.00)
GFR calc Af Amer: >60 mL/min (ref >60)
GFR calc non Af Amer: >60 mL/min (ref >60)
Glucose, Bld: 251 mg/dL — ABNORMAL HIGH (ref 70–99)
Potassium: 4.2 mmol/L (ref 3.5–5.1)
Sodium: 136 mmol/L (ref 135–145)

## 2019-10-10 LAB — SARS CORONAVIRUS 2 (TAT 6-24 HRS): SARS Coronavirus 2: NEGATIVE

## 2019-10-11 ENCOUNTER — Ambulatory Visit: Payer: Medicaid Other | Admitting: Physical Therapy

## 2019-10-11 ENCOUNTER — Encounter: Payer: Self-pay | Admitting: Physical Therapy

## 2019-10-11 DIAGNOSIS — R262 Difficulty in walking, not elsewhere classified: Secondary | ICD-10-CM | POA: Diagnosis not present

## 2019-10-11 DIAGNOSIS — G8929 Other chronic pain: Secondary | ICD-10-CM

## 2019-10-11 DIAGNOSIS — M5441 Lumbago with sciatica, right side: Secondary | ICD-10-CM | POA: Diagnosis not present

## 2019-10-11 DIAGNOSIS — M6281 Muscle weakness (generalized): Secondary | ICD-10-CM

## 2019-10-11 DIAGNOSIS — M25561 Pain in right knee: Secondary | ICD-10-CM | POA: Diagnosis not present

## 2019-10-11 NOTE — Therapy (Signed)
[facility] [place], [place], [address] Phone: [phone]   Fax:  [phone]  Physical Therapy Treatment  Patient Details  Name: Sherry Glass MRN: [id number] Date of Birth: 1971/02/08 Referring Provider (PT): Evelina Dun, [name]   Encounter Date: 10/11/2019   PT End of Session - 10/11/19 0905    Visit Number 3    Number of Visits 4    Date for PT Re-Evaluation 11/07/19    Authorization Type MCD: applied for initial 3 visits on 09/20/2019    PT Start Time 0905    PT Stop Time 0947    PT Time Calculation (min) 42 min    Activity Tolerance Patient tolerated treatment well    Behavior During Therapy [facility] for tasks assessed/performed           Past Medical History:  Diagnosis Date  . Allergic rhinitis   . Anxiety and depression   . Cataract   . Chest pain syndrome   . COPD (chronic obstructive pulmonary disease) ([place])   . Depression   . Diabetes mellitus without complication ([place])   . History of kidney stones   . HTN (hypertension)   . Hypercholesterolemia   . Hyperglycemia   . Hypertension   . Hypothyroidism   . Obesity   . Restless leg syndrome   . Tobacco abuse     Past Surgical History:  Procedure Laterality Date  . ABDOMINAL HYSTERECTOMY    . CARDIAC CATHETERIZATION  2014  . CATARACT EXTRACTION W/PHACO Left 06/23/2016   Procedure: CATARACT EXTRACTION PHACO AND INTRAOCULAR LENS PLACEMENT (IOC);  Surgeon: Tonny Branch, MD;  Location: [facility];  Service: Ophthalmology;  Laterality: Left;  left - pt knows to arrive at 10:50 cde 9.11  . CESAREAN SECTION    . CHOLECYSTECTOMY    . EXCISION MASS NECK     cyst  . FOOT MASS EXCISION    . left foot surgery     removal of cyst  . NECK SURGERY    . THYROID SURGERY     Dr. Arnoldo Morale  . TONSILLECTOMY      There were no vitals filed for this visit.   Subjective Assessment - 10/11/19 0904    Subjective COVID-19 screening performed upon arrival. Patient arrived  with ongoing pain in right low back and LE    Pertinent History gall baldder, mass removed from foot and neck, hysterectomy, cataract surgery right eye    How long can you sit comfortably? 15 minutes    How long can you stand comfortably? 10 minutes    Diagnostic tests X-ray:    Patient Stated Goals Stop hurting    Currently in Pain? Yes    Pain Score 10-Worst pain ever    Pain Location Back    Pain Orientation Right    Pain Descriptors / Indicators Constant;Discomfort    Pain Type Chronic pain    Pain Onset More than a month ago    Pain Frequency Constant              [facility] PT Assessment - 10/11/19 0001      Assessment   Medical Diagnosis Low back pain with R sided sciatica    Referring Provider (PT) Evelina Dun, [name]    Hand Dominance Right    Prior Therapy yes for knee      Precautions   Precautions None      Restrictions   Weight Bearing Restrictions No  [facility] Adult PT Treatment/Exercise - 10/11/19 0001      Modalities   Modalities Electrical Stimulation;Moist Heat;Ultrasound      Moist Heat Therapy   Number Minutes Moist Heat 15 Minutes    Moist Heat Location Lumbar Spine      Electrical Stimulation   Electrical Stimulation Location R low back    Electrical Stimulation Action Pre-Mod    Electrical Stimulation Parameters 80-150 hz x15 min    Electrical Stimulation Goals Pain;Tone      Ultrasound   Ultrasound Location R low back    Ultrasound Parameters Combo 1.5 w/cm2, 100%,1 mhz x10 min    Ultrasound Goals Pain      Manual Therapy   Manual Therapy Soft tissue mobilization    Soft tissue mobilization STW/TPR to R lumbar paraspinals, QL, glute to reduce muscle tightness and TPs                    PT Short Term Goals - 10/09/19 0914      PT SHORT TERM GOAL #1   Title Patient will be independent with HEP    Baseline met 10/09/19    Time 3    Period Weeks    Status Achieved             PT Long  Term Goals - 10/09/19 5409      PT LONG TERM GOAL #1   Title Patient will demonstrate improved lumbar flexion to be albe to list an 10# object from floor to counter height with pain </=3/10.    Baseline 60 degrees, pain with lifting 8/10    Time 8    Period Weeks    Status On-going   unable to lift any heavy objects 10/09/19     PT LONG TERM GOAL #2   Title Pt will be able to amb 20 minutes with pain </= 4/10 in order to go to [facility] and participate in community activities like grocery shopping.    Baseline 34mn only before pain and symptoms 6/21    Time 8    Period Weeks    Status On-going                 Plan - 10/11/19 0955    Clinical Impression Statement Patient presented in clinic with 10/10 R LBP with radicular pain down RLE. Patient states that pain began around 5 am this morning. TPs and muscle guarding noted throughout R lumbar paraspinals and especially in R glute. Patient reported less pain during session as well as at end of PT session. Normal modalities response noted following removal of the modalities.    Personal Factors and Comorbidities Comorbidity 3+    Comorbidities DM, COPD, anxiety, HTN, depression, kidnesy stones, obesity, RLS, chest pain syndrome    Examination-Activity Limitations Stairs;Stand;Lift;Sit    Examination-Participation Restrictions Other;Community Activity    Stability/Clinical Decision Making Evolving/Moderate complexity    Rehab Potential Fair    PT Frequency 2x / week    PT Duration 8 weeks    PT Treatment/Interventions Cryotherapy;Taping;Ultrasound;Traction;Moist Heat;Electrical Stimulation;Gait training;Stair training;Functional mobility training;Therapeutic activities;Therapeutic exercise;Balance training;Neuromuscular re-education;Passive range of motion;Dry needling;Manual techniques    PT Next Visit Plan cont with POC for mechanical traction, hamstring stretches, piriformis stretches, SKTC, trunk rotation, modalities as needed, IASTM  as tolerated    PT Home Exercise Plan see pt instrucitons    Consulted and Agree with Plan of Care Patient           Patient will benefit  from skilled therapeutic intervention in order to improve the following deficits and impairments:  Pain, Postural dysfunction, Decreased activity tolerance, Decreased strength, Decreased range of motion, Impaired flexibility, Difficulty walking  Visit Diagnosis: Chronic bilateral low back pain with right-sided sciatica  Difficulty in walking, not elsewhere classified  Muscle weakness (generalized)     Problem List Patient Active Problem List   Diagnosis Date Noted  . Loose stools 06/04/2019  . Hepatic steatosis 05/23/2019  . Psoriasis 05/09/2019  . Encounter for screening colonoscopy 02/28/2019  . Depression, major, single episode, mild ([place]) 10/12/2018  . 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  . Nocturnal hypoxemia 05/29/2017  . GAD (generalized anxiety disorder) 02/13/2017  . Restrictive lung disease 01/27/2017  . Lung nodule < 6cm on CT 01/27/2017  . GERD (gastroesophageal reflux disease) 11/18/2016  . Snoring 11/18/2016  . Witnessed episode of apnea 11/18/2016  . Chronic seasonal allergic rhinitis 11/18/2016  . Restless leg syndrome 11/18/2016  . Fibromyalgia 11/18/2016  . Hyperlipidemia associated with type 2 diabetes mellitus ([place]) 06/09/2016  . Vitamin D deficiency 05/16/2016  . Hyperthyroidism 05/16/2016  . Morbid obesity ([place]) 05/13/2016  . Uncontrolled type 2 diabetes mellitus with hyperglycemia ([place]) 05/13/2016  . COPD (chronic obstructive pulmonary disease) ([place]) 05/13/2016  . Chest pain syndrome   . Hypertension associated with diabetes ([place])   . Stopped smoking with greater than 25 pack year history     Standley Brooking, PTA 10/11/2019, 10:00 AM  [facility] [place], [place], [address] Phone: [phone]   Fax:  [phone]  Name: Sherry Glass MRN: [id number] Date of Birth: [birth year]

## 2019-10-14 ENCOUNTER — Encounter (HOSPITAL_COMMUNITY)
Admission: RE | Disposition: A | Discharge status: Home / Self Care | Payer: Self-pay | Source: Home / Self Care | Attending: Internal Medicine

## 2019-10-14 ENCOUNTER — Ambulatory Visit (HOSPITAL_COMMUNITY): Payer: Medicaid Other | Admitting: Anesthesiology

## 2019-10-14 ENCOUNTER — Other Ambulatory Visit: Payer: Self-pay

## 2019-10-14 ENCOUNTER — Ambulatory Visit (HOSPITAL_COMMUNITY)
Admission: RE | Admit: 2019-10-14 | Discharge: 2019-10-14 | Disposition: A | Discharge status: Home / Self Care | Payer: Medicaid Other | Attending: Internal Medicine | Admitting: Internal Medicine

## 2019-10-14 ENCOUNTER — Encounter (HOSPITAL_COMMUNITY): Payer: Self-pay | Admitting: Internal Medicine

## 2019-10-14 DIAGNOSIS — Z6835 Body mass index (BMI) 35.0-35.9, adult: Secondary | ICD-10-CM | POA: Insufficient documentation

## 2019-10-14 DIAGNOSIS — Z79890 Hormone replacement therapy (postmenopausal): Secondary | ICD-10-CM | POA: Insufficient documentation

## 2019-10-14 DIAGNOSIS — Z7984 Long term (current) use of oral hypoglycemic drugs: Secondary | ICD-10-CM | POA: Diagnosis not present

## 2019-10-14 DIAGNOSIS — K635 Polyp of colon: Secondary | ICD-10-CM | POA: Diagnosis not present

## 2019-10-14 DIAGNOSIS — Z79899 Other long term (current) drug therapy: Secondary | ICD-10-CM | POA: Diagnosis not present

## 2019-10-14 DIAGNOSIS — E039 Hypothyroidism, unspecified: Secondary | ICD-10-CM | POA: Diagnosis not present

## 2019-10-14 DIAGNOSIS — Z1211 Encounter for screening for malignant neoplasm of colon: Secondary | ICD-10-CM | POA: Diagnosis not present

## 2019-10-14 DIAGNOSIS — E1136 Type 2 diabetes mellitus with diabetic cataract: Secondary | ICD-10-CM | POA: Diagnosis not present

## 2019-10-14 DIAGNOSIS — E669 Obesity, unspecified: Secondary | ICD-10-CM | POA: Insufficient documentation

## 2019-10-14 DIAGNOSIS — J449 Chronic obstructive pulmonary disease, unspecified: Secondary | ICD-10-CM | POA: Insufficient documentation

## 2019-10-14 DIAGNOSIS — F419 Anxiety disorder, unspecified: Secondary | ICD-10-CM | POA: Insufficient documentation

## 2019-10-14 DIAGNOSIS — G894 Chronic pain syndrome: Secondary | ICD-10-CM | POA: Diagnosis not present

## 2019-10-14 DIAGNOSIS — I10 Essential (primary) hypertension: Secondary | ICD-10-CM | POA: Insufficient documentation

## 2019-10-14 DIAGNOSIS — K219 Gastro-esophageal reflux disease without esophagitis: Secondary | ICD-10-CM | POA: Insufficient documentation

## 2019-10-14 DIAGNOSIS — Z87442 Personal history of urinary calculi: Secondary | ICD-10-CM | POA: Insufficient documentation

## 2019-10-14 DIAGNOSIS — K5730 Diverticulosis of large intestine without perforation or abscess without bleeding: Secondary | ICD-10-CM | POA: Diagnosis not present

## 2019-10-14 DIAGNOSIS — Z87891 Personal history of nicotine dependence: Secondary | ICD-10-CM | POA: Diagnosis not present

## 2019-10-14 DIAGNOSIS — E7800 Pure hypercholesterolemia, unspecified: Secondary | ICD-10-CM | POA: Diagnosis not present

## 2019-10-14 DIAGNOSIS — F329 Major depressive disorder, single episode, unspecified: Secondary | ICD-10-CM | POA: Diagnosis not present

## 2019-10-14 DIAGNOSIS — Z7951 Long term (current) use of inhaled steroids: Secondary | ICD-10-CM | POA: Diagnosis not present

## 2019-10-14 DIAGNOSIS — Z7982 Long term (current) use of aspirin: Secondary | ICD-10-CM | POA: Insufficient documentation

## 2019-10-14 DIAGNOSIS — G2581 Restless legs syndrome: Secondary | ICD-10-CM | POA: Diagnosis not present

## 2019-10-14 DIAGNOSIS — Z9981 Dependence on supplemental oxygen: Secondary | ICD-10-CM | POA: Diagnosis not present

## 2019-10-14 HISTORY — PX: POLYPECTOMY: SHX5525

## 2019-10-14 HISTORY — PX: COLONOSCOPY WITH PROPOFOL: SHX5780

## 2019-10-14 LAB — GLUCOSE, CAPILLARY
Glucose-Capillary: 173 mg/dL — ABNORMAL HIGH (ref 70–99)
Glucose-Capillary: 159 mg/dL — ABNORMAL HIGH (ref 70–99)

## 2019-10-14 SURGERY — COLONOSCOPY WITH PROPOFOL
Anesthesia: General

## 2019-10-14 MED ORDER — PROPOFOL 10 MG/ML IV BOLUS
INTRAVENOUS | Status: DC | PRN
  Administered 2019-10-14 (×2): 10 mg via INTRAVENOUS
  Administered 2019-10-14: 40 mg via INTRAVENOUS
  Administered 2019-10-14: 50 mg via INTRAVENOUS

## 2019-10-14 MED ORDER — CHLORHEXIDINE GLUCONATE CLOTH 2 % EX PADS: 6.0000 | MEDICATED_PAD | Freq: Once | CUTANEOUS | Status: DC

## 2019-10-14 MED ORDER — KETAMINE HCL 10 MG/ML IJ SOLN
INTRAMUSCULAR | Status: DC | PRN
  Administered 2019-10-14: 15 mg via INTRAVENOUS

## 2019-10-14 MED ORDER — PROPOFOL 500 MG/50ML IV EMUL
INTRAVENOUS | Status: DC | PRN
  Administered 2019-10-14: 150 ug/kg/min via INTRAVENOUS

## 2019-10-14 MED ORDER — LACTATED RINGERS IV SOLN
INTRAVENOUS | Status: DC | PRN
Start: 2019-10-14 — End: 2019-10-14

## 2019-10-14 MED ORDER — LACTATED RINGERS IV SOLN: INTRAVENOUS | Status: DC

## 2019-10-14 MED ORDER — PROPOFOL 10 MG/ML IV BOLUS
INTRAVENOUS | Status: AC
  Filled 2019-10-14: qty 20

## 2019-10-14 MED ORDER — KETAMINE HCL 50 MG/5ML IJ SOSY
PREFILLED_SYRINGE | INTRAMUSCULAR | Status: AC
  Filled 2019-10-14: qty 5

## 2019-10-14 NOTE — Anesthesia Preprocedure Evaluation (Signed)
Anesthesia Evaluation  Patient identified by MRN, date of birth, ID band Patient awake    Reviewed: Allergy & Precautions, H&P , NPO status , Patient's Chart, lab work & pertinent test results, reviewed documented beta blocker date and time   Airway Mallampati: III  TM Distance: >3 FB Neck ROM: full    Dental no notable dental hx. (+) Teeth Intact   Pulmonary COPD,  COPD inhaler and oxygen dependent, former smoker,    Pulmonary exam normal breath sounds clear to auscultation       Cardiovascular Exercise Tolerance: Good hypertension, negative cardio ROS   Rhythm:regular Rate:Normal     Neuro/Psych PSYCHIATRIC DISORDERS Anxiety Depression  Neuromuscular disease    GI/Hepatic Neg liver ROS, GERD  ,  Endo/Other  diabetesHypothyroidism   Renal/GU negative Renal ROS  negative genitourinary   Musculoskeletal   Abdominal   Peds  Hematology negative hematology ROS (+)   Anesthesia Other Findings   Reproductive/Obstetrics negative OB ROS                             Anesthesia Physical Anesthesia Plan  ASA: III  Anesthesia Plan: General   Post-op Pain Management:    Induction:   PONV Risk Score and Plan: 3 and Propofol infusion  Airway Management Planned:   Additional Equipment:   Intra-op Plan:   Post-operative Plan:   Informed Consent: I have reviewed the patients History and Physical, chart, labs and discussed the procedure including the risks, benefits and alternatives for the proposed anesthesia with the patient or authorized representative who has indicated his/her understanding and acceptance.     Dental Advisory Given  Plan Discussed with: CRNA  Anesthesia Plan Comments:         Anesthesia Quick Evaluation

## 2019-10-14 NOTE — Discharge Instructions (Signed)
Colonoscopy Discharge Instructions  Read the instructions outlined below and refer to this sheet in the next few weeks. These discharge instructions provide you with general information on caring for yourself after you leave the hospital. Your doctor may also give you specific instructions. While your treatment has been planned according to the most current medical practices available, unavoidable complications occasionally occur. If you have any problems or questions after discharge, call Dr. Jena Gauss at [phone]. ACTIVITY  You may resume your regular activity, but move at a slower pace for the next 24 hours.   Take frequent rest periods for the next 24 hours.   Walking will help get rid of the air and reduce the bloated feeling in your belly (abdomen).   No driving for 24 hours (because of the medicine (anesthesia) used during the test).    Do not sign any important legal documents or operate any machinery for 24 hours (because of the anesthesia used during the test).  NUTRITION  Drink plenty of fluids.   You may resume your normal diet as instructed by your doctor.   Begin with a light meal and progress to your normal diet. Heavy or fried foods are harder to digest and may make you feel sick to your stomach (nauseated).   Avoid alcoholic beverages for 24 hours or as instructed.  MEDICATIONS  You may resume your normal medications unless your doctor tells you otherwise.  WHAT YOU CAN EXPECT TODAY  Some feelings of bloating in the abdomen.   Passage of more gas than usual.   Spotting of blood in your stool or on the toilet paper.  IF YOU HAD POLYPS REMOVED DURING THE COLONOSCOPY:  No aspirin products for 7 days or as instructed.   No alcohol for 7 days or as instructed.   Eat a soft diet for the next 24 hours.  FINDING OUT THE RESULTS OF YOUR TEST Not all test results are available during your visit. If your test results are not back during the visit, make an appointment  with your caregiver to find out the results. Do not assume everything is normal if you have not heard from your caregiver or the medical facility. It is important for you to follow up on all of your test results.  SEEK IMMEDIATE MEDICAL ATTENTION IF:  You have more than a spotting of blood in your stool.   Your belly is swollen (abdominal distention).   You are nauseated or vomiting.   You have a temperature over 101.   You have abdominal pain or discomfort that is severe or gets worse throughout the day.    Polyp and diverticulosis information provided  Further recommendations to follow pending review of pathology report  At patient request I called Maureen Chatters at [phone] and reviewed results   PATIENT INSTRUCTIONS POST-ANESTHESIA  IMMEDIATELY FOLLOWING SURGERY:  Do not drive or operate machinery for the first twenty four hours after surgery.  Do not make any important decisions for twenty four hours after surgery or while taking narcotic pain medications or sedatives.  If you develop intractable nausea and vomiting or a severe headache please notify your doctor immediately.  FOLLOW-UP:  Please make an appointment with your surgeon as instructed. You do not need to follow up with anesthesia unless specifically instructed to do so.  WOUND CARE INSTRUCTIONS (if applicable):  Keep a dry clean dressing on the anesthesia/puncture wound site if there is drainage.  Once the wound has quit draining you may leave it  open to air.  Generally you should leave the bandage intact for twenty four hours unless there is drainage.  If the epidural site drains for more than 36-48 hours please call the anesthesia department.  QUESTIONS?:  Please feel free to call your physician or the hospital operator if you have any questions, and they will be happy to assist you.      Colon Polyps  Polyps are tissue growths inside the body. Polyps can grow in many places, including the large intestine  (colon). A polyp may be a round bump or a mushroom-shaped growth. You could have one polyp or several. Most colon polyps are noncancerous (benign). However, some colon polyps can become cancerous over time. Finding and removing the polyps early can help prevent this. What are the causes? The exact cause of colon polyps is not known. What increases the risk? You are more likely to develop this condition if you:  Have a family history of colon cancer or colon polyps.  Are older than 11 or older than 45 if you are African American.  Have inflammatory bowel disease, such as ulcerative colitis or Crohn's disease.  Have certain hereditary conditions, such as: ? Familial adenomatous polyposis. ? Lynch syndrome. ? Turcot syndrome. ? Peutz-Jeghers syndrome.  Are overweight.  Smoke cigarettes.  Do not get enough exercise.  Drink too much alcohol.  Eat a diet that is high in fat and red meat and low in fiber.  Had childhood cancer that was treated with abdominal radiation. What are the signs or symptoms? Most polyps do not cause symptoms. If you have symptoms, they may include:  Blood coming from your rectum when having a bowel movement.  Blood in your stool. The stool may look dark red or black.  Abdominal pain.  A change in bowel habits, such as constipation or diarrhea. How is this diagnosed? This condition is diagnosed with a colonoscopy. This is a procedure in which a lighted, flexible scope is inserted into the anus and then passed into the colon to examine the area. Polyps are sometimes found when a colonoscopy is done as part of routine cancer screening tests. How is this treated? Treatment for this condition involves removing any polyps that are found. Most polyps can be removed during a colonoscopy. Those polyps will then be tested for cancer. Additional treatment may be needed depending on the results of testing. Follow these instructions at home: Lifestyle  Maintain a  healthy weight, or lose weight if recommended by your health care provider.  Exercise every day or as told by your health care provider.  Do not use any products that contain nicotine or tobacco, such as cigarettes and e-cigarettes. If you need help quitting, ask your health care provider.  If you drink alcohol, limit how much you have: ? 0-1 drink a day for women. ? 0-2 drinks a day for men.  Be aware of how much alcohol is in your drink. In the [place] one drink equals one 12 oz bottle of beer (355 mL), one 5 oz glass of wine (148 mL), or one 1 oz shot of hard liquor (44 mL). Eating and drinking   Eat foods that are high in fiber, such as fruits, vegetables, and whole grains.  Eat foods that are high in calcium and vitamin D, such as milk, cheese, yogurt, eggs, liver, fish, and broccoli.  Limit foods that are high in fat, such as fried foods and desserts.  Limit the amount of red meat and processed  meat you eat, such as hot dogs, sausage, bacon, and lunch meats. General instructions  Keep all follow-up visits as told by your health care provider. This is important. ? This includes having regularly scheduled colonoscopies. ? Talk to your health care provider about when you need a colonoscopy. Contact a health care provider if:  You have new or worsening bleeding during a bowel movement.  You have new or increased blood in your stool.  You have a change in bowel habits.  You lose weight for no known reason. Summary  Polyps are tissue growths inside the body. Polyps can grow in many places, including the colon.  Most colon polyps are noncancerous (benign), but some can become cancerous over time.  This condition is diagnosed with a colonoscopy.  Treatment for this condition involves removing any polyps that are found. Most polyps can be removed during a colonoscopy. This information is not intended to replace advice given to you by your health care provider. Make sure you  discuss any questions you have with your health care provider. Document Revised: 08/03/2017 Document Reviewed: 08/03/2017 Elsevier Patient Education  [place].    Diverticulosis  Diverticulosis is a condition that develops when small pouches (diverticula) form in the wall of the large intestine (colon). The colon is where water is absorbed and stool (feces) is formed. The pouches form when the inside layer of the colon pushes through weak spots in the outer layers of the colon. You may have a few pouches or many of them. The pouches usually do not cause problems unless they become inflamed or infected. When this happens, the condition is called diverticulitis. What are the causes? The cause of this condition is not known. What increases the risk? The following factors may make you more likely to develop this condition:  Being older than age 12. Your risk for this condition increases with age. Diverticulosis is rare among people younger than age 11. By age 70, many people have it.  Eating a low-fiber diet.  Having frequent constipation.  Being overweight.  Not getting enough exercise.  Smoking.  Taking over-the-counter pain medicines, like aspirin and ibuprofen.  Having a family history of diverticulosis. What are the signs or symptoms? In most people, there are no symptoms of this condition. If you do have symptoms, they may include:  Bloating.  Cramps in the abdomen.  Constipation or diarrhea.  Pain in the lower left side of the abdomen. How is this diagnosed? Because diverticulosis usually has no symptoms, it is most often diagnosed during an exam for other colon problems. The condition may be diagnosed by:  Using a flexible scope to examine the colon (colonoscopy).  Taking an X-ray of the colon after dye has been put into the colon (barium enema).  Having a CT scan. How is this treated? You may not need treatment for this condition. Your health care  provider may recommend treatment to prevent problems. You may need treatment if you have symptoms or if you previously had diverticulitis. Treatment may include:  Eating a high-fiber diet.  Taking a fiber supplement.  Taking a live bacteria supplement (probiotic).  Taking medicine to relax your colon. Follow these instructions at home: Medicines  Take over-the-counter and prescription medicines only as told by your health care provider.  If told by your health care provider, take a fiber supplement or probiotic. Constipation prevention Your condition may cause constipation. To prevent or treat constipation, you may need to:  Drink enough fluid to  keep your urine pale yellow.  Take over-the-counter or prescription medicines.  Eat foods that are high in fiber, such as beans, whole grains, and fresh fruits and vegetables.  Limit foods that are high in fat and processed sugars, such as fried or sweet foods.  General instructions  Try not to strain when you have a bowel movement.  Keep all follow-up visits as told by your health care provider. This is important. Contact a health care provider if you:  Have pain in your abdomen.  Have bloating.  Have cramps.  Have not had a bowel movement in 3 days. Get help right away if:  Your pain gets worse.  Your bloating becomes very bad.  You have a fever or chills, and your symptoms suddenly get worse.  You vomit.  You have bowel movements that are bloody or black.  You have bleeding from your rectum. Summary  Diverticulosis is a condition that develops when small pouches (diverticula) form in the wall of the large intestine (colon).  You may have a few pouches or many of them.  This condition is most often diagnosed during an exam for other colon problems.  Treatment may include increasing the fiber in your diet, taking supplements, or taking medicines. This information is not intended to replace advice given to you by  your health care provider. Make sure you discuss any questions you have with your health care provider. Document Revised: 11/15/2018 Document Reviewed: 11/15/2018 Elsevier Patient Education  2020 [employer].

## 2019-10-14 NOTE — Transfer of Care (Signed)
Immediate Anesthesia Transfer of Care Note  Patient: Sherry Glass  Procedure(s) Performed: COLONOSCOPY WITH PROPOFOL (N/A )  Patient Location: PACU  Anesthesia Type:General  Level of Consciousness: awake  Airway & Oxygen Therapy: Patient Spontanous Breathing  Post-op Assessment: Report given to RN  Post vital signs: Reviewed  Last Vitals:  Vitals Value Taken Time  BP 127/77 10/14/19 0844  Temp    Pulse 87 10/14/19 0845  Resp 21 10/14/19 0845  SpO2 98 % 10/14/19 0845  Vitals shown include unvalidated device data.  Last Pain:  Vitals:   10/14/19 0814  PainSc: 0-No pain      Patients Stated Pain Goal: 8 (10/14/19 0703)  Complications: No complications documented.

## 2019-10-14 NOTE — Anesthesia Postprocedure Evaluation (Signed)
Anesthesia Post Note  Patient: Sherry Glass  Procedure(s) Performed: COLONOSCOPY WITH PROPOFOL (N/A )  Patient location during evaluation: PACU Anesthesia Type: General Level of consciousness: awake and alert and oriented Pain management: pain level controlled Vital Signs Assessment: post-procedure vital signs reviewed and stable Respiratory status: spontaneous breathing Cardiovascular status: stable and blood pressure returned to baseline Postop Assessment: no apparent nausea or vomiting Anesthetic complications: no   No complications documented.   Last Vitals:  Vitals:   10/14/19 0703 10/14/19 0845  BP: (!) 143/75 127/77  Pulse: 86 87  Resp: (!) 21 (!) 21  Temp: 36.5 C (P) 36.8 C  SpO2:  98%    Last Pain:  Vitals:   10/14/19 0814  PainSc: 0-No pain                 Estephanie Hubbs

## 2019-10-14 NOTE — Op Note (Signed)
[facility] Patient Name: Sherry Glass Procedure Date: 10/14/2019 8:04 AM MRN: [id number] Date of Birth: [birth year] Attending MD: Norvel Richards , MD CSN: [id number] Age: 49 Admit Type: Outpatient Procedure:                Colonoscopy Indications:              Screening for colorectal malignant neoplasm Providers:                Norvel Richards, MD, Janeece Riggers, RN, Crystal                            Page, Nelma Rothman, Technician Referring MD:              Medicines:                Propofol per Anesthesia Complications:            No immediate complications. Estimated Blood Loss:     Estimated blood loss was minimal. Procedure:                Pre-Anesthesia Assessment:                           - Prior to the procedure, a History and Physical                            was performed, and patient medications and                            allergies were reviewed. The patient's tolerance of                            previous anesthesia was also reviewed. The risks                            and benefits of the procedure and the sedation                            options and risks were discussed with the patient.                            All questions were answered, and informed consent                            was obtained. Prior Anticoagulants: The patient has                            taken no previous anticoagulant or antiplatelet                            agents. ASA Grade Assessment: II - A patient with                            mild systemic disease. After reviewing the risks  and benefits, the patient was deemed in                            satisfactory condition to undergo the procedure.                           After obtaining informed consent, the colonoscope                            was passed under direct vision. Throughout the                            procedure, the patient's blood pressure, pulse, and                             oxygen saturations were monitored continuously. The                            CF-[id number] ([id number]) scope was introduced through                            the anus and advanced to the the cecum, identified                            by appendiceal orifice and ileocecal valve. The                            colonoscopy was performed without difficulty. The                            patient tolerated the procedure well. The quality                            of the bowel preparation was adequate. Scope In: 8:19:37 AM Scope Out: 8:38:55 AM Scope Withdrawal Time: 0 hours 16 minutes 43 seconds  Total Procedure Duration: 0 hours 19 minutes 18 seconds  Findings:      The perianal and digital rectal examinations were normal.      Two sessile polyps were found in the sigmoid colon. The polyps were 4 to       5 mm in size. These polyps were removed with a cold snare. Resection and       retrieval were complete. Estimated blood loss was minimal.      Scattered small-mouthed diverticula were found in the sigmoid colon.      The exam was otherwise without abnormality on direct and retroflexion       views. Impression:               - Two 4 to 5 mm polyps in the sigmoid colon,                            removed with a cold snare. Resected and retrieved.                           - Diverticulosis in the sigmoid  colon.                           - The examination was otherwise normal on direct                            and retroflexion views. Moderate Sedation:      Moderate (conscious) sedation was personally administered by an       anesthesia professional. The following parameters were monitored: oxygen       saturation, heart rate, blood pressure, respiratory rate, EKG, adequacy       of pulmonary ventilation, and response to care. Recommendation:           - Patient has a contact number available for                            emergencies. The signs and symptoms of potential                             delayed complications were discussed with the                            patient. Return to normal activities tomorrow.                            Written discharge instructions were provided to the                            patient.                           - Resume previous diet.                           - Continue present medications.                           - Repeat colonoscopy date to be determined after                            pending pathology results are reviewed for                            surveillance.                           - Return to GI office (date not yet determined). Procedure Code(s):        --- Professional ---                           [phone], Colonoscopy, flexible; with removal of                            tumor(s), polyp(s), or other lesion(s) by snare                            technique  Diagnosis Code(s):        --- Professional ---                           Z12.11, Encounter for screening for malignant                            neoplasm of colon                           K63.5, Polyp of colon                           K57.30, Diverticulosis of large intestine without                            perforation or abscess without bleeding CPT copyright 2019 American [employer] Association. All rights reserved. The codes documented in this report are preliminary and upon coder review may  be revised to meet current compliance requirements. Gerrit Friends. Rourk, MD Gennette Pac, MD 10/14/2019 8:46:22 AM This report has been signed electronically. Number of Addenda: 0

## 2019-10-14 NOTE — H&P (Signed)
_0 @   Primary Care Physician:  Sharion Balloon, [name] Primary Gastroenterologist:  Dr. Gala Romney  Pre-Procedure History & Physical: HPI:  Sherry Glass is a 49 y.o. female is here for a screening colonoscopy.  No bowel symptoms.  No family history of colon cancer.  No prior colonoscopy Past Medical History:  Diagnosis Date  . Allergic rhinitis   . Anxiety and depression   . Cataract   . Chest pain syndrome   . COPD (chronic obstructive pulmonary disease) ([place])   . Depression   . Diabetes mellitus without complication ([place])   . History of kidney stones   . HTN (hypertension)   . Hypercholesterolemia   . Hyperglycemia   . Hypertension   . Hypothyroidism   . Obesity   . Restless leg syndrome   . Tobacco abuse     Past Surgical History:  Procedure Laterality Date  . ABDOMINAL HYSTERECTOMY    . CARDIAC CATHETERIZATION  2014  . CATARACT EXTRACTION W/PHACO Left 06/23/2016   Procedure: CATARACT EXTRACTION PHACO AND INTRAOCULAR LENS PLACEMENT (IOC);  Surgeon: Tonny Branch, MD;  Location: [facility];  Service: Ophthalmology;  Laterality: Left;  left - pt knows to arrive at 10:50 cde 9.11  . CESAREAN SECTION    . CHOLECYSTECTOMY    . EXCISION MASS NECK     cyst  . FOOT MASS EXCISION    . left foot surgery     removal of cyst  . NECK SURGERY    . THYROID SURGERY     Dr. Arnoldo Morale  . TONSILLECTOMY      Prior to Admission medications   Medication Sig Start Date End Date Taking? Authorizing Provider  acetaminophen (TYLENOL) 500 MG tablet Take 500 mg by mouth every 6 (six) hours as needed.   Yes [provider]  albuterol (PROVENTIL) (2.5 MG/3ML) 0.083% nebulizer solution Use 1 vial (3 mL) via nebulizer every 6 hours as needed for wheezing or shortness of breath 08/17/18  Yes Martyn Ehrich, NP  aspirin EC 81 MG tablet Take one po qd Patient taking differently: Take 81 mg by mouth daily.  11/09/16  Yes Hawks, [name], [name]  atorvastatin (LIPITOR) 20 MG tablet Take 1 tablet (20  mg total) by mouth daily. 05/14/19  Yes Hawks, [name], [name]  budesonide (PULMICORT) 0.5 MG/2ML nebulizer solution Take 2 mLs (0.5 mg total) 2 (two) times daily by nebulization. 03/13/17  Yes Javier Glazier, MD  cyclobenzaprine (FLEXERIL) 10 MG tablet Take 1 tablet by mouth three times daily as needed for muscle spasm Patient taking differently: Take 10 mg by mouth 3 (three) times daily as needed for muscle spasms. Take 1 tablet by mouth three times daily as needed for muscle spasm 06/25/19  Yes Hawks, [name], [name]  dicyclomine (BENTYL) 10 MG capsule Take 1 capsule (10 mg total) by mouth 4 (four) times daily -  before meals and at bedtime. For cramping and loose stool. Do not take if constipated 06/24/19  Yes Annitta Needs, NP  DOVONEX 0.005 % cream Apply topically 2 (two) times daily. 05/16/19  Yes Hawks, [name], [name]  empagliflozin (JARDIANCE) 10 MG TABS tablet Take 10 mg by mouth daily before breakfast. 05/14/19  Yes Hawks, [name], [name]  formoterol (PERFOROMIST) 20 MCG/2ML nebulizer solution Take 2 mLs (20 mcg total) by nebulization 2 (two) times daily. 08/17/18  Yes Martyn Ehrich, NP  gabapentin (NEURONTIN) 300 MG capsule Take 300 mg by mouth 3 (three) times daily.  Yes [provider]  glimepiride (AMARYL) 2 MG tablet Take 1 tablet by mouth once daily with breakfast Patient taking differently: Take 2 mg by mouth daily with breakfast.  08/07/19  Yes Hawks, [name], [name]  Guselkumab (TREMFYA) 100 MG/ML SOPN Inject 100 mg into the skin See admin instructions. 8 weeks   Yes [provider]  levothyroxine (SYNTHROID) 100 MCG tablet Take 1 tablet (100 mcg total) by mouth daily before breakfast. 09/26/19  Yes Nida, Marella Chimes, MD  losartan (COZAAR) 25 MG tablet Take 1 tablet (25 mg total) by mouth daily. 09/13/19  Yes Hawks, [name], [name]  metFORMIN (GLUCOPHAGE) 1000 MG tablet TAKE 1 TABLET BY MOUTH TWICE DAILY WITH MEALS Patient taking differently: Take 1,000 mg by mouth  2 (two) times daily with a meal.  07/24/19  Yes Hawks, [name], [name]  nitroGLYCERIN (NITROSTAT) 0.4 MG SL tablet Place 1 tablet (0.4 mg total) under the tongue every 5 (five) minutes as needed for chest pain. 07/13/12  Yes Serpe, Burna Forts, PA-C  propranolol (INDERAL) 20 MG tablet Take 1 tablet (20 mg total) by mouth 2 (two) times daily. 09/04/18  Yes Nida, Marella Chimes, MD  STIOLTO RESPIMAT 2.5-2.5 MCG/ACT AERS Inhale 2 puffs into the lungs daily.  11/02/18  Yes [provider]  Vitamin D, Ergocalciferol, (DRISDOL) 1.25 MG (50000 UNIT) CAPS capsule Take 1 capsule (50,000 Units total) by mouth every 7 (seven) days. 07/15/19  Yes Hawks, [name], [name]  ACCU-CHEK FASTCLIX LANCETS MISC USE TO check blood glucose twice daily 07/04/17   Evelina Dun A, [name]  blood glucose meter kit and supplies KIT Dispense based on patient and insurance preference. Use up to four times daily as directed. (FOR ICD-9 250.00, 250.01). 09/20/19   Evelina Dun A, [name]  glucose blood (ACCU-CHEK GUIDE) test strip USE TO check blood glucose twice daily 07/04/17   Evelina Dun A, [name]    Allergies as of 05/29/2019 - Review Complete 05/09/2019  Allergen Reaction Noted  . Dilaudid [hydromorphone]  05/13/2016  . Hydromorphone  07/03/2012  . Valproic acid and related  07/03/2012    Family History  Adopted: Yes  Family history unknown: Yes    Social History   Socioeconomic History  . Marital status: Widowed    Spouse name: Not on file  . Number of children: Not on file  . Years of education: Not on file  . Highest education level: Not on file  Occupational History  . Not on file  Tobacco Use  . Smoking status: Former Smoker    Packs/day: 1.00    Years: 25.00    Pack years: 25.00    Types: Cigarettes    Start date: 11/13/1988    Quit date: 10/28/2016    Years since quitting: 2.9  . Smokeless tobacco: Never Used  Vaping Use  . Vaping Use: Never used  Substance and Sexual Activity  . Alcohol use: Yes     Comment: For special occasions, very rare   . Drug use: No  . Sexual activity: Never    Birth control/protection: Surgical  Other Topics Concern  . Not on file  Social History Narrative   [facility] (11/18/16):   Originally from [place] but grew up in [place]. Cared for her husband until he passed. 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 Does have significant dust exposure. Has 3 dogs currently. Previously had a parakeet as a child. No mold exposure. Enjoys playing with her grandchildren.  Social Determinants of Health   Financial Resource Strain:   . Difficulty of Paying Living Expenses:   Food Insecurity:   . Worried About Charity fundraiser in the Last Year:   . Arboriculturist in the Last Year:   Transportation Needs:   . Film/video editor (Medical):   Marland Kitchen Lack of Transportation (Non-Medical):   Physical Activity:   . Days of Exercise per Week:   . Minutes of Exercise per Session:   Stress:   . Feeling of Stress :   Social Connections:   . Frequency of Communication with Friends and Family:   . Frequency of Social Gatherings with Friends and Family:   . Attends Religious Services:   . Active Member of Clubs or Organizations:   . Attends Archivist Meetings:   Marland Kitchen Marital Status:   Intimate Partner Violence:   . Fear of Current or Ex-Partner:   . Emotionally Abused:   Marland Kitchen Physically Abused:   . Sexually Abused:     Review of Systems: See HPI, otherwise negative ROS  Physical Exam: BP (!) 143/75   Pulse 86   Temp 97.7 F (36.5 C)   Resp (!) 21   Ht _0  (1.626 m)   Wt 93 kg   BMI 35.19 kg/m  General:   Alert,  Well-developed, well-nourished, pleasant and cooperative in NAD Lungs:  Clear throughout to auscultation.   No wheezes, crackles, or rhonchi. No acute distress. Heart:  Regular rate and rhythm; no murmurs, clicks, rubs,  or gallops. Abdomen:  Soft, nontender and nondistended. No masses,  hepatosplenomegaly or hernias noted. Normal bowel sounds, without guarding, and without rebound.   Impression/Plan: Sherry Glass is now here to undergo a screening colonoscopy.  First ever average rescreening examination  Risks, benefits, limitations, imponderables and alternatives regarding colonoscopy have been reviewed with the patient. Questions have been answered. All parties agreeable.     Notice:  This dictation was prepared with Dragon dictation along with smaller phrase technology. Any transcriptional errors that result from this process are unintentional and may not be corrected upon review.

## 2019-10-15 LAB — SURGICAL PATHOLOGY

## 2019-10-16 ENCOUNTER — Encounter: Payer: Self-pay | Admitting: Internal Medicine

## 2019-10-17 ENCOUNTER — Encounter (HOSPITAL_COMMUNITY): Payer: Self-pay | Admitting: Internal Medicine

## 2019-10-21 ENCOUNTER — Other Ambulatory Visit: Payer: Self-pay

## 2019-10-21 ENCOUNTER — Telehealth: Payer: Self-pay | Admitting: Emergency Medicine

## 2019-10-21 ENCOUNTER — Ambulatory Visit: Payer: Medicaid Other | Admitting: Physical Therapy

## 2019-10-21 ENCOUNTER — Other Ambulatory Visit: Payer: Self-pay | Admitting: Family

## 2019-10-21 ENCOUNTER — Encounter: Payer: Self-pay | Admitting: Physical Therapy

## 2019-10-21 DIAGNOSIS — G8929 Other chronic pain: Secondary | ICD-10-CM

## 2019-10-21 DIAGNOSIS — M25561 Pain in right knee: Secondary | ICD-10-CM | POA: Diagnosis not present

## 2019-10-21 DIAGNOSIS — M5441 Lumbago with sciatica, right side: Secondary | ICD-10-CM | POA: Diagnosis not present

## 2019-10-21 DIAGNOSIS — R262 Difficulty in walking, not elsewhere classified: Secondary | ICD-10-CM | POA: Diagnosis not present

## 2019-10-21 DIAGNOSIS — M6281 Muscle weakness (generalized): Secondary | ICD-10-CM | POA: Diagnosis not present

## 2019-10-21 DIAGNOSIS — M5416 Radiculopathy, lumbar region: Secondary | ICD-10-CM

## 2019-10-21 DIAGNOSIS — R918 Other nonspecific abnormal finding of lung field: Secondary | ICD-10-CM

## 2019-10-21 NOTE — Telephone Encounter (Signed)
New order has been placed. 

## 2019-10-21 NOTE — Telephone Encounter (Signed)
[facility] has scheduled order from [employer].  Nothing further needed.

## 2019-10-21 NOTE — Therapy (Signed)
[facility] [address] [place], [place], [address] Phone: [phone]   Fax:  [phone]  Physical Therapy Treatment  Patient Details  Name: Sherry Glass MRN: [id number] Date of Birth: 1971-01-23 Referring Provider (PT): Jannifer Rodney, [name]   Encounter Date: 10/21/2019   PT End of Session - 10/21/19 0939    Visit Number 4    Number of Visits 16   per MCD approval   Date for PT Re-Evaluation 11/07/19    Authorization Type MCD: applied for initial 3 visits on 09/20/2019    PT Start Time 0947    PT Stop Time 1028    PT Time Calculation (min) 41 min    Activity Tolerance Patient tolerated treatment well    Behavior During Therapy [facility] for tasks assessed/performed           Past Medical History:  Diagnosis Date  . Allergic rhinitis   . Anxiety and depression   . Cataract   . Chest pain syndrome   . COPD (chronic obstructive pulmonary disease) ([facility])   . Depression   . Diabetes mellitus without complication ([facility])   . History of kidney stones   . HTN (hypertension)   . Hypercholesterolemia   . Hyperglycemia   . Hypertension   . Hypothyroidism   . Obesity   . Restless leg syndrome   . Tobacco abuse     Past Surgical History:  Procedure Laterality Date  . ABDOMINAL HYSTERECTOMY    . CARDIAC CATHETERIZATION  2014  . CATARACT EXTRACTION W/PHACO Left 06/23/2016   Procedure: CATARACT EXTRACTION PHACO AND INTRAOCULAR LENS PLACEMENT (IOC);  Surgeon: Gemma Payor, MD;  Location: [facility];  Service: Ophthalmology;  Laterality: Left;  left - pt knows to arrive at 10:50 cde 9.11  . CESAREAN SECTION    . CHOLECYSTECTOMY    . COLONOSCOPY WITH PROPOFOL N/A 10/14/2019   Procedure: COLONOSCOPY WITH PROPOFOL;  Surgeon: Corbin Ade, MD;  Location: [facility];  Service: Endoscopy;  Laterality: N/A;  8:00  . EXCISION MASS NECK     cyst  . FOOT MASS EXCISION    . left foot surgery     removal of cyst  . NECK SURGERY    . POLYPECTOMY   10/14/2019   Procedure: POLYPECTOMY;  Surgeon: Corbin Ade, MD;  Location: [facility];  Service: Endoscopy;;  . THYROID SURGERY     Dr. Lovell Sheehan  . TONSILLECTOMY      There were no vitals filed for this visit.   Subjective Assessment - 10/21/19 0938    Subjective COVID-19 screening performed upon arrival. Patient reports she is starting to feel more numbness than before.    Pertinent History gall baldder, mass removed from foot and neck, hysterectomy, cataract surgery right eye    How long can you sit comfortably? 15 minutes    How long can you stand comfortably? 10 minutes    Diagnostic tests X-ray:    Patient Stated Goals Stop hurting    Pain Score 5     Pain Location Back    Pain Orientation Right    Pain Descriptors / Indicators Discomfort    Pain Type Chronic pain    Pain Radiating Towards RLE to foot    Pain Onset More than a month ago    Pain Frequency Constant              [facility] PT Assessment - 10/21/19 0001      Assessment   Medical  Diagnosis Low back pain with R sided sciatica    Referring Provider (PT) Evelina Dun, [name]    Hand Dominance Right    Prior Therapy yes for knee      Precautions   Precautions None      Restrictions   Weight Bearing Restrictions No                         [facility] Adult PT Treatment/Exercise - 10/21/19 0001      Modalities   Modalities Electrical Stimulation;Moist Heat;Ultrasound      Moist Heat Therapy   Number Minutes Moist Heat 15 Minutes    Moist Heat Location Lumbar Spine      Electrical Stimulation   Electrical Stimulation Location R low back    Electrical Stimulation Action Pre-Mod    Electrical Stimulation Parameters 80-150 hz x15 min    Electrical Stimulation Goals Pain;Tone      Ultrasound   Ultrasound Location R low back    Ultrasound Parameters Combo 1.5 w/cm2, 100%, 1 mhz x10 min    Ultrasound Goals Pain      Manual Therapy   Manual Therapy Soft tissue mobilization    Soft tissue  mobilization STW/TPR to R lumbar paraspinals, QL, glute to reduce muscle tightness and TPs                    PT Short Term Goals - 10/09/19 0914      PT SHORT TERM GOAL #1   Title Patient will be independent with HEP    Baseline met 10/09/19    Time 3    Period Weeks    Status Achieved             PT Long Term Goals - 10/21/19 1014      PT LONG TERM GOAL #1   Title Patient will demonstrate improved lumbar flexion to be albe to list an 10# object from floor to counter height with pain </=3/10.    Baseline 60 degrees, pain with lifting 8/10    Time 8    Period Weeks    Status Not Met   Unable to lift anything heavy 10/21/2019     PT LONG TERM GOAL #2   Title Pt will be able to amb 20 minutes with pain </= 4/10 in order to go to [facility] and participate in community activities like grocery shopping.    Baseline 26mn only before pain and symptoms 6/21    Time 8    Period Weeks    Status Not Met   9/10 with RLE numbness 10/21/2019                Plan - 10/21/19 1025    Clinical Impression Statement Patient presented in clinic with reports of continued LBP limiting ADLs such as walking or lifting. Patient tried HEP but states that it was too paiful. Moderate tone palpable in R lumbar paraspinals along upper lumbar vertebrae. Radicular pain notable intermittantly per patient to R foot. Normal modalities response noted following removal of the modalities.    Personal Factors and Comorbidities Comorbidity 3+    Comorbidities DM, COPD, anxiety, HTN, depression, kidnesy stones, obesity, RLS, chest pain syndrome    Examination-Activity Limitations Stairs;Stand;Lift;Sit    Examination-Participation Restrictions Other;Community Activity    Stability/Clinical Decision Making Evolving/Moderate complexity    Rehab Potential Fair    PT Frequency 2x / week    PT Duration 8 weeks  PT Treatment/Interventions Cryotherapy;Taping;Ultrasound;Traction;Moist Heat;Electrical  Stimulation;Gait training;Stair training;Functional mobility training;Therapeutic activities;Therapeutic exercise;Balance training;Neuromuscular re-education;Passive range of motion;Dry needling;Manual techniques    PT Next Visit Plan Patient to contact PCP as pain is still present.    PT Home Exercise Plan see pt instrucitons    Consulted and Agree with Plan of Care Patient           Patient will benefit from skilled therapeutic intervention in order to improve the following deficits and impairments:  Pain, Postural dysfunction, Decreased activity tolerance, Decreased strength, Decreased range of motion, Impaired flexibility, Difficulty walking  Visit Diagnosis: Chronic bilateral low back pain with right-sided sciatica  Difficulty in walking, not elsewhere classified     Problem List Patient Active Problem List   Diagnosis Date Noted  . Loose stools 06/04/2019  . Hepatic steatosis 05/23/2019  . Psoriasis 05/09/2019  . Encounter for screening colonoscopy 02/28/2019  . Depression, major, single episode, mild ([place]) 10/12/2018  . 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  . Nocturnal hypoxemia 05/29/2017  . GAD (generalized anxiety disorder) 02/13/2017  . Restrictive lung disease 01/27/2017  . Lung nodule < 6cm on CT 01/27/2017  . GERD (gastroesophageal reflux disease) 11/18/2016  . Snoring 11/18/2016  . Witnessed episode of apnea 11/18/2016  . Chronic seasonal allergic rhinitis 11/18/2016  . Restless leg syndrome 11/18/2016  . Fibromyalgia 11/18/2016  . Hyperlipidemia associated with type 2 diabetes mellitus ([place]) 06/09/2016  . Vitamin D deficiency 05/16/2016  . Hyperthyroidism 05/16/2016  . Morbid obesity ([employer]) 05/13/2016  . Uncontrolled type 2 diabetes mellitus with hyperglycemia ([place]) 05/13/2016  . COPD (chronic obstructive pulmonary disease) ([place]) 05/13/2016  . Chest pain syndrome   . Hypertension associated with diabetes ([place])   . Stopped smoking with  greater than 25 pack year history     Standley Brooking, PTA 10/21/2019, 10:31 AM  [facility] [place], [place], [address] Phone: [phone]   Fax:  [phone]  Name: Sherry Glass MRN: [id number] Date of Birth: Dec 22, 1970

## 2019-10-21 NOTE — Telephone Encounter (Signed)
I do not see any open orders for a ct we will need a new order to be able to schedule

## 2019-10-21 NOTE — Telephone Encounter (Signed)
Order for CT of chest without contrast was ordered on 01/03/2019 by Dr. Delton Coombes. The order expires on 03/04/2020.

## 2019-10-21 NOTE — Telephone Encounter (Signed)
We need a new order all of these orders are either denied or closed

## 2019-10-23 DIAGNOSIS — H40033 Anatomical narrow angle, bilateral: Secondary | ICD-10-CM | POA: Diagnosis not present

## 2019-10-23 DIAGNOSIS — H52223 Regular astigmatism, bilateral: Secondary | ICD-10-CM | POA: Diagnosis not present

## 2019-10-23 DIAGNOSIS — H2511 Age-related nuclear cataract, right eye: Secondary | ICD-10-CM | POA: Diagnosis not present

## 2019-10-23 DIAGNOSIS — H5213 Myopia, bilateral: Secondary | ICD-10-CM | POA: Diagnosis not present

## 2019-10-23 DIAGNOSIS — E119 Type 2 diabetes mellitus without complications: Secondary | ICD-10-CM | POA: Diagnosis not present

## 2019-10-23 DIAGNOSIS — Z961 Presence of intraocular lens: Secondary | ICD-10-CM | POA: Diagnosis not present

## 2019-10-23 DIAGNOSIS — H524 Presbyopia: Secondary | ICD-10-CM | POA: Diagnosis not present

## 2019-10-23 LAB — HM DIABETES EYE EXAM

## 2019-10-24 DIAGNOSIS — H5213 Myopia, bilateral: Secondary | ICD-10-CM | POA: Diagnosis not present

## 2019-10-25 ENCOUNTER — Encounter: Payer: Medicaid Other | Admitting: Physical Therapy

## 2019-10-28 ENCOUNTER — Telehealth: Payer: Self-pay

## 2019-10-28 NOTE — Telephone Encounter (Signed)
Called to inform patient of appointment for MRI - [facility]   Appointment 11/02/2019 @ 4pm arrival time 330 - at [facility] [place]

## 2019-11-01 ENCOUNTER — Ambulatory Visit
Admission: RE | Admit: 2019-11-01 | Discharge: 2019-11-01 | Disposition: A | Discharge status: Home / Self Care | Payer: Medicaid Other | Source: Ambulatory Visit | Attending: Emergency Medicine | Admitting: Emergency Medicine

## 2019-11-01 DIAGNOSIS — M47814 Spondylosis without myelopathy or radiculopathy, thoracic region: Secondary | ICD-10-CM | POA: Diagnosis not present

## 2019-11-01 DIAGNOSIS — R918 Other nonspecific abnormal finding of lung field: Secondary | ICD-10-CM | POA: Diagnosis not present

## 2019-11-01 DIAGNOSIS — Z9049 Acquired absence of other specified parts of digestive tract: Secondary | ICD-10-CM | POA: Diagnosis not present

## 2019-11-01 DIAGNOSIS — R911 Solitary pulmonary nodule: Secondary | ICD-10-CM | POA: Diagnosis not present

## 2019-11-02 ENCOUNTER — Other Ambulatory Visit: Payer: Self-pay

## 2019-11-02 ENCOUNTER — Ambulatory Visit (HOSPITAL_BASED_OUTPATIENT_CLINIC_OR_DEPARTMENT_OTHER)
Admission: RE | Admit: 2019-11-02 | Discharge: 2019-11-02 | Disposition: A | Discharge status: Home / Self Care | Payer: Medicaid Other | Source: Ambulatory Visit | Attending: Family | Admitting: Family

## 2019-11-02 DIAGNOSIS — M5126 Other intervertebral disc displacement, lumbar region: Secondary | ICD-10-CM | POA: Diagnosis not present

## 2019-11-02 DIAGNOSIS — M5416 Radiculopathy, lumbar region: Secondary | ICD-10-CM | POA: Insufficient documentation

## 2019-11-02 DIAGNOSIS — Q7649 Other congenital malformations of spine, not associated with scoliosis: Secondary | ICD-10-CM | POA: Diagnosis not present

## 2019-11-02 DIAGNOSIS — M47816 Spondylosis without myelopathy or radiculopathy, lumbar region: Secondary | ICD-10-CM | POA: Diagnosis not present

## 2019-11-02 DIAGNOSIS — M48061 Spinal stenosis, lumbar region without neurogenic claudication: Secondary | ICD-10-CM | POA: Diagnosis not present

## 2019-11-02 MED ORDER — GADOBUTROL 1 MMOL/ML IV SOLN
9.0000 mL | Freq: Once | INTRAVENOUS | Status: AC | PRN
  Administered 2019-11-02: 9 mL via INTRAVENOUS

## 2019-11-05 ENCOUNTER — Other Ambulatory Visit: Payer: Self-pay | Admitting: Family

## 2019-11-05 DIAGNOSIS — M5136 Other intervertebral disc degeneration, lumbar region: Secondary | ICD-10-CM

## 2019-11-05 DIAGNOSIS — M48061 Spinal stenosis, lumbar region without neurogenic claudication: Secondary | ICD-10-CM

## 2019-11-05 DIAGNOSIS — M51369 Other intervertebral disc degeneration, lumbar region without mention of lumbar back pain or lower extremity pain: Secondary | ICD-10-CM

## 2019-11-05 DIAGNOSIS — M5126 Other intervertebral disc displacement, lumbar region: Secondary | ICD-10-CM

## 2019-11-08 ENCOUNTER — Telehealth: Payer: Self-pay | Admitting: Emergency Medicine

## 2019-11-08 DIAGNOSIS — L409 Psoriasis, unspecified: Secondary | ICD-10-CM

## 2019-11-08 DIAGNOSIS — Z1283 Encounter for screening for malignant neoplasm of skin: Secondary | ICD-10-CM

## 2019-11-08 NOTE — Telephone Encounter (Signed)
Patient calling for CT results, please advise. Patient called and updated.   IMPRESSION: 1. Small area of focal scarring and/or atelectasis within the posterior aspect of the right upper lobe. This area is slightly nodular in appearance on the prior study. These nodular appearing areas are not clearly identified on the current exam. Correlation with follow-up chest CT is recommended to determine stability. 2. Fatty liver. 3. Evidence of prior cholecystectomy. 4. 1.2 cm low-attenuation left adrenal mass which may represent an adrenal adenoma.

## 2019-11-11 ENCOUNTER — Other Ambulatory Visit: Payer: Self-pay | Admitting: Family

## 2019-11-11 DIAGNOSIS — E785 Hyperlipidemia, unspecified: Secondary | ICD-10-CM

## 2019-11-12 ENCOUNTER — Other Ambulatory Visit: Payer: Self-pay | Admitting: Family

## 2019-11-12 ENCOUNTER — Telehealth: Payer: Self-pay | Admitting: Family

## 2019-11-12 DIAGNOSIS — L989 Disorder of the skin and subcutaneous tissue, unspecified: Secondary | ICD-10-CM

## 2019-11-12 DIAGNOSIS — M5416 Radiculopathy, lumbar region: Secondary | ICD-10-CM

## 2019-11-12 NOTE — Telephone Encounter (Signed)
Spoke with lady from [facility] stating that she received a referral from [place] for pt but was unsure if we meant to send referral to their office or not because on referral [facility] is listed. Explained to pt that we are trying to send referral to someone that accepts pts insurance. Lady confirmed that they do accept pts insurance and said all they need from [place] is to resend the referral with their name on it.

## 2019-11-13 NOTE — Telephone Encounter (Signed)
Please let her know that her CT scan is stable compared with her prior.  No change in her small pulmonary nodule/opacity.  This is good news.  Since it has been stable for over 2 years we likely will not need to follow further.

## 2019-11-13 NOTE — Telephone Encounter (Signed)
Advised pt of results. Pt understood and nothing further is needed.   

## 2019-11-13 NOTE — Telephone Encounter (Signed)
RB please advise. Thanks.  

## 2019-11-14 ENCOUNTER — Other Ambulatory Visit: Payer: Self-pay | Admitting: Family

## 2019-11-14 DIAGNOSIS — E785 Hyperlipidemia, unspecified: Secondary | ICD-10-CM

## 2019-11-14 NOTE — Telephone Encounter (Signed)
Called [facility] and had to leave a message.

## 2019-11-14 NOTE — Telephone Encounter (Signed)
Referral placed to Neurosurgeon 

## 2019-11-18 ENCOUNTER — Other Ambulatory Visit: Payer: Self-pay | Admitting: *Deleted

## 2019-11-18 DIAGNOSIS — E785 Hyperlipidemia, unspecified: Secondary | ICD-10-CM

## 2019-11-18 DIAGNOSIS — G8929 Other chronic pain: Secondary | ICD-10-CM

## 2019-11-18 DIAGNOSIS — M5416 Radiculopathy, lumbar region: Secondary | ICD-10-CM | POA: Diagnosis not present

## 2019-11-18 DIAGNOSIS — Z6837 Body mass index (BMI) 37.0-37.9, adult: Secondary | ICD-10-CM | POA: Diagnosis not present

## 2019-11-18 DIAGNOSIS — I10 Essential (primary) hypertension: Secondary | ICD-10-CM | POA: Diagnosis not present

## 2019-11-18 MED ORDER — METFORMIN HCL 1000 MG PO TABS: 1000.0000 mg | ORAL_TABLET | Freq: Two times a day (BID) | ORAL | 1 refills | Status: DC

## 2019-11-18 MED ORDER — GABAPENTIN 100 MG PO CAPS: ORAL_CAPSULE | ORAL | 1 refills | Status: DC

## 2019-11-18 MED ORDER — LOSARTAN POTASSIUM 25 MG PO TABS: 25.0000 mg | ORAL_TABLET | Freq: Every day | ORAL | 1 refills | Status: DC

## 2019-11-18 MED ORDER — PRAVASTATIN SODIUM 20 MG PO TABS: 20.0000 mg | ORAL_TABLET | Freq: Every day | ORAL | 1 refills | Status: DC

## 2019-11-18 MED ORDER — GLIMEPIRIDE 2 MG PO TABS: 2.0000 mg | ORAL_TABLET | Freq: Every day | ORAL | 1 refills | Status: DC

## 2019-11-18 MED ORDER — BUDESONIDE 0.5 MG/2ML IN SUSP: 0.5000 mg | Freq: Two times a day (BID) | RESPIRATORY_TRACT | 3 refills | Status: DC

## 2019-11-18 MED ORDER — ALBUTEROL SULFATE (2.5 MG/3ML) 0.083% IN NEBU: INHALATION_SOLUTION | RESPIRATORY_TRACT | 5 refills | Status: DC

## 2019-11-18 NOTE — Progress Notes (Signed)
ALL Pt meds were taken off her med list in Epic by accident when she was at [facility] 10/14/19. She is still taking these daily and she thinks that this is an error that they were removed. GI would not have stopped all the meds without letting her know anything.   We went through her meds one by one and added them back to epic. Med list is correct as of today.  The refills she needed were sent in to [facility].  She is aware to call with any further issues.

## 2019-11-19 DIAGNOSIS — H52223 Regular astigmatism, bilateral: Secondary | ICD-10-CM | POA: Diagnosis not present

## 2019-11-19 DIAGNOSIS — H524 Presbyopia: Secondary | ICD-10-CM | POA: Diagnosis not present

## 2019-11-20 DIAGNOSIS — L400 Psoriasis vulgaris: Secondary | ICD-10-CM | POA: Diagnosis not present

## 2019-11-21 DIAGNOSIS — M47816 Spondylosis without myelopathy or radiculopathy, lumbar region: Secondary | ICD-10-CM | POA: Diagnosis not present

## 2019-11-21 DIAGNOSIS — M5416 Radiculopathy, lumbar region: Secondary | ICD-10-CM | POA: Diagnosis not present

## 2019-11-21 DIAGNOSIS — M48062 Spinal stenosis, lumbar region with neurogenic claudication: Secondary | ICD-10-CM | POA: Insufficient documentation

## 2019-12-05 ENCOUNTER — Ambulatory Visit: Payer: Medicaid Other | Admitting: Nurse Practitioner

## 2019-12-10 ENCOUNTER — Encounter: Payer: Self-pay | Admitting: Family

## 2019-12-10 ENCOUNTER — Other Ambulatory Visit: Payer: Self-pay

## 2019-12-10 ENCOUNTER — Ambulatory Visit (INDEPENDENT_AMBULATORY_CARE_PROVIDER_SITE_OTHER): Payer: Medicaid Other | Admitting: Family

## 2019-12-10 VITALS — BP 132/80 | HR 72 | Temp 95.8°F | Wt 216.6 lb

## 2019-12-10 DIAGNOSIS — F411 Generalized anxiety disorder: Secondary | ICD-10-CM

## 2019-12-10 DIAGNOSIS — J449 Chronic obstructive pulmonary disease, unspecified: Secondary | ICD-10-CM | POA: Diagnosis not present

## 2019-12-10 DIAGNOSIS — E890 Postprocedural hypothyroidism: Secondary | ICD-10-CM

## 2019-12-10 DIAGNOSIS — E1169 Type 2 diabetes mellitus with other specified complication: Secondary | ICD-10-CM | POA: Diagnosis not present

## 2019-12-10 DIAGNOSIS — E1159 Type 2 diabetes mellitus with other circulatory complications: Secondary | ICD-10-CM | POA: Diagnosis not present

## 2019-12-10 DIAGNOSIS — E785 Hyperlipidemia, unspecified: Secondary | ICD-10-CM

## 2019-12-10 DIAGNOSIS — K219 Gastro-esophageal reflux disease without esophagitis: Secondary | ICD-10-CM

## 2019-12-10 DIAGNOSIS — M5440 Lumbago with sciatica, unspecified side: Secondary | ICD-10-CM

## 2019-12-10 DIAGNOSIS — I10 Essential (primary) hypertension: Secondary | ICD-10-CM

## 2019-12-10 DIAGNOSIS — E559 Vitamin D deficiency, unspecified: Secondary | ICD-10-CM | POA: Diagnosis not present

## 2019-12-10 DIAGNOSIS — F320 Major depressive disorder, single episode, mild: Secondary | ICD-10-CM

## 2019-12-10 DIAGNOSIS — G8929 Other chronic pain: Secondary | ICD-10-CM

## 2019-12-10 DIAGNOSIS — E1165 Type 2 diabetes mellitus with hyperglycemia: Secondary | ICD-10-CM | POA: Diagnosis not present

## 2019-12-10 DIAGNOSIS — J984 Other disorders of lung: Secondary | ICD-10-CM

## 2019-12-10 LAB — BAYER DCA HB A1C WAIVED: HB A1C (BAYER DCA - WAIVED): 7.6 % — ABNORMAL HIGH (ref <7.0)

## 2019-12-10 MED ORDER — CYCLOBENZAPRINE HCL 10 MG PO TABS: 10.0000 mg | ORAL_TABLET | Freq: Three times a day (TID) | ORAL | 2 refills | Status: DC | PRN

## 2019-12-10 NOTE — Progress Notes (Signed)
Subjective:    Patient ID: Sherry Glass, female    DOB: 1970-12-19, 49 y.o.   MRN: [id number]  Chief Complaint  Patient presents with  . Medical Management of Chronic Issues    3 mth follow up, back pain lower back, Pt is fasting    . Depression  . Diabetes   Pt presents to the office today for chronic follow up. Pt is followed by Pulmonologist81monthfor COPD, Restrictive lung disease, and SOB. Followed by Endocrinologists every3 monthsfor hypothyroidism. She had thyroid ablation.  She is followed by [facility] every 6 months for chronic back pain.   Depression        This is a chronic problem.  The current episode started more than 1 year ago.   The onset quality is gradual.   The problem occurs intermittently.  The problem has been waxing and waning since onset.  Associated symptoms include fatigue, irritable, restlessness and sad.  Past treatments include nothing.  Past medical history includes hypothyroidism and thyroid problem.   Diabetes She presents for her follow-up diabetic visit. She has type 2 diabetes mellitus. Her disease course has been stable. Hypoglycemia symptoms include nervousness/anxiousness. Associated symptoms include fatigue, foot paresthesias and weakness. Pertinent negatives for diabetes include no blurred vision. Risk factors for coronary artery disease include dyslipidemia, diabetes mellitus, hypertension, sedentary lifestyle and post-menopausal. Her weight is stable. She is following a generally unhealthy diet. Her overall blood glucose range is 140-180 mg/dl. An ACE inhibitor/angiotensin II receptor blocker is being taken. Eye exam is current.  Hyperlipidemia This is a chronic problem. The current episode started more than 1 year ago. Exacerbating diseases include hypothyroidism. Current antihyperlipidemic treatment includes statins. The current treatment provides moderate improvement of lipids. Risk factors for coronary artery disease include  dyslipidemia, diabetes mellitus, hypertension, a sedentary lifestyle and post-menopausal.  Thyroid Problem Presents for follow-up visit. Symptoms include anxiety, depressed mood, fatigue and hoarse voice. The symptoms have been stable. Her past medical history is significant for hyperlipidemia.  Back Pain This is a chronic problem. The current episode started more than 1 year ago. The problem occurs intermittently. The problem has been waxing and waning since onset. The pain is present in the lumbar spine. The quality of the pain is described as aching. The pain is at a severity of 5/10. The pain is moderate. The symptoms are aggravated by bending and twisting. Associated symptoms include tingling and weakness. Risk factors include obesity. She has tried bed rest and muscle relaxant for the symptoms. The treatment provided mild relief.  Gastroesophageal Reflux She complains of belching, heartburn and a hoarse voice. This is a chronic problem. The current episode started more than 1 year ago. The problem occurs occasionally. Associated symptoms include fatigue. She has tried a PPI for the symptoms. The treatment provided moderate relief.      Review of Systems  Constitutional: Positive for fatigue.  HENT: Positive for hoarse voice.   Eyes: Negative for blurred vision.  Gastrointestinal: Positive for heartburn.  Musculoskeletal: Positive for back pain.  Neurological: Positive for tingling and weakness.  Psychiatric/Behavioral: Positive for depression. The patient is nervous/anxious.   All other systems reviewed and are negative.      Objective:   Physical Exam Vitals reviewed.  Constitutional:      General: She is irritable. She is not in acute distress.    Appearance: She is well-developed. She is obese.  HENT:     Head: Normocephalic and atraumatic.     Right Ear:  Tympanic membrane normal.     Left Ear: Tympanic membrane normal.  Eyes:     Pupils: Pupils are equal, round, and  reactive to light.  Neck:     Thyroid: No thyromegaly.  Cardiovascular:     Rate and Rhythm: Normal rate and regular rhythm.     Heart sounds: Normal heart sounds. No murmur heard.   Pulmonary:     Effort: Pulmonary effort is normal. No respiratory distress.     Breath sounds: Normal breath sounds. No wheezing.  Abdominal:     General: Bowel sounds are normal. There is no distension.     Palpations: Abdomen is soft.     Tenderness: There is no abdominal tenderness.  Musculoskeletal:        General: No tenderness.     Cervical back: Normal range of motion and neck supple.     Comments: Pain in lumbar with flexion and extension   Skin:    General: Skin is warm and dry.  Neurological:     Mental Status: She is alert and oriented to person, place, and time.     Cranial Nerves: No cranial nerve deficit.     Deep Tendon Reflexes: Reflexes are normal and symmetric.  Psychiatric:        Behavior: Behavior normal.        Thought Content: Thought content normal.        Judgment: Judgment normal.     BP 132/80   Pulse 72   Temp (!) 95.8 F (35.4 C) (Temporal)   Wt 216 lb 9.6 oz (98.2 kg)   SpO2 98%   BMI 37.18 kg/m        Assessment & Plan:  Sherry Glass comes in today with chief complaint of Medical Management of Chronic Issues (3 mth follow up, back pain lower back, Pt is fasting  ), Depression, and Diabetes   Diagnosis and orders addressed:  1. Uncontrolled type 2 diabetes mellitus with hyperglycemia (HCC) - Bayer DCA Hb A1c Waived - CMP14+EGFR  2. Hypertension associated with diabetes ([place]) - CMP14+EGFR  3. Chronic obstructive pulmonary disease, unspecified COPD type ([place]) - CMP14+EGFR  4. Restrictive lung disease - CMP14+EGFR  5. Gastroesophageal reflux disease, unspecified whether esophagitis present  - CMP14+EGFR  6. Hyperlipidemia associated with type 2 diabetes mellitus (HCC) - CMP14+EGFR  7. Hypothyroidism following radioiodine therapy -  CMP14+EGFR  8. Depression, major, single episode, mild (HCC)  - CMP14+EGFR  9. GAD (generalized anxiety disorder) - CMP14+EGFR  10. Morbid obesity ([place]) - CMP14+EGFR  11. Vitamin D deficiency - CMP14+EGFR  12. Chronic bilateral low back pain with sciatica, sciatica laterality unspecified  - cyclobenzaprine (FLEXERIL) 10 MG tablet; Take 1 tablet (10 mg total) by mouth 3 (three) times daily as needed for muscle spasms.  Dispense: 30 tablet; Refill: 2 - CMP14+EGFR   Labs pending Health Maintenance reviewed Diet and exercise encouraged  Follow up plan: 4 months    Evelina Dun, [name]

## 2019-12-10 NOTE — Patient Instructions (Signed)

## 2019-12-11 LAB — CMP14+EGFR
ALT: 38 IU/L — ABNORMAL HIGH (ref 0–32)
AST: 21 IU/L (ref 0–40)
Albumin/Globulin Ratio: 1.6 (ref 1.2–2.2)
Albumin: 4.3 g/dL (ref 3.8–4.9)
Alkaline Phosphatase: 90 IU/L (ref 48–121)
BUN/Creatinine Ratio: 17 (ref 9–23)
BUN: 10 mg/dL (ref 6–24)
Bilirubin Total: 0.6 mg/dL (ref 0.0–1.2)
CO2: 23 mmol/L (ref 20–29)
Calcium: 9.7 mg/dL (ref 8.7–10.2)
Chloride: 102 mmol/L (ref 96–106)
Creatinine, Ser: 0.58 mg/dL (ref 0.57–1.00)
GFR calc Af Amer: 123 mL/min/{1.73_m2} (ref >59)
GFR calc non Af Amer: 107 mL/min/{1.73_m2} (ref >59)
Globulin, Total: 2.7 g/dL (ref 1.5–4.5)
Glucose: 211 mg/dL — ABNORMAL HIGH (ref 65–99)
Potassium: 4.4 mmol/L (ref 3.5–5.2)
Sodium: 141 mmol/L (ref 134–144)
Total Protein: 7 g/dL (ref 6.0–8.5)

## 2019-12-19 ENCOUNTER — Other Ambulatory Visit: Payer: Self-pay

## 2019-12-19 ENCOUNTER — Encounter: Payer: Self-pay | Admitting: Nurse Practitioner

## 2019-12-19 ENCOUNTER — Ambulatory Visit: Payer: Medicaid Other | Admitting: Nurse Practitioner

## 2019-12-19 VITALS — BP 143/84 | HR 82 | Temp 97.2°F | Ht 64.0 in | Wt 217.0 lb

## 2019-12-19 DIAGNOSIS — K760 Fatty (change of) liver, not elsewhere classified: Secondary | ICD-10-CM

## 2019-12-19 NOTE — Progress Notes (Signed)
Referring Provider: Junie Spencer, [name] Primary Care Physician:  Junie Spencer, [name] Primary GI:  Dr. Jena Gauss  Chief Complaint  Patient presents with  . fatty liver    f/u    HPI:   Sherry Glass is a 49 y.o. female who presents for follow-up and fatty liver disease.  The patient was last seen in our office 06/04/2019 for hepatic steatosis loose stools.  Noted history of IBS diarrhea type, first ever colonoscopy already planned for April 2021, referred for elevated ALT.  Previous review of labs found historical normal LFTs but a mildly isolated ALT in March 2020 and January 2021.  Noted hypertriglyceridemia, A1c of 7.7, negative viral hepatitis panel, and right upper quadrant ultrasound with diffuse hepatic steatosis.  Chronically on Metformin.  At her last visit she feels like her stools have increased, at that time 4-5 times a day alternating soft and watery.  This has been ongoing since cholecystectomy.  Not wanting to perform stool studies at that time, declines supportive measures for loose stools.  Has been changing her diet recently.  Has been exercising recently as well.  Recommended colonoscopy as scheduled, trial off Metformin with referral on this to PCP, recheck LFTs in 6 months, strict glycemic control, exercise, other behavior modifications for fatty liver disease.  Follow-up in 6 months.  Colonoscopy completed 10/14/2019 which found two sessile polyps in the sigmoid colon from 4 to 5 mm in size, scattered small bowel diverticula in the sigmoid colon.  Surgical pathology found the polyps to be hyperplastic.  Recommended repeat colonoscopy in 10 years (2031).  Repeat CMP and hemoglobin A1c on 12/10/2019 found stable but persistently elevated A1c at 7.6.  CMP with persistent, minimal elevation of ALT at 38.  Today she states she is doing okay overall. She states her hgb a1c is still elevated because she's been on steroids for her back. She is no longer on those. She is still on  glimepiride and Glucophage. She walks in the evening about 3 days a week for about 30 minutes. Gym membership isn't an option right now due to COVID-19. She has been making a good effort in dietary changes, no fatty foods, using a dehydrator and air fryer. Denies abdominal pain, N/V, hematochezia, melena, fever, chills, unintentional weight loss. Denies URI or flu-like symptoms. Denies loss of sense of taste or smell. Denies URI or flu-like symptoms. Denies loss of sense of taste or smell. The patient has received COVID-19 vaccination(s). They are not interested in vaccine scheduling information. Denies chest pain, dyspnea, dizziness, lightheadedness, syncope, near syncope. Denies any other upper or lower GI symptoms.   Last lipiod panel with elevated triglycerides, but normal total cholesterol. Her weight is about 2 lbs lower than her last visit (essentially stable). Doesn't add any salt to her food. Blood pressure a little elevated, but not dangerous.  Past Medical History:  Diagnosis Date  . Allergic rhinitis   . Anxiety and depression   . Cataract   . Chest pain syndrome   . COPD (chronic obstructive pulmonary disease) (HCC)   . Depression   . Diabetes mellitus without complication (HCC)   . History of kidney stones   . HTN (hypertension)   . Hypercholesterolemia   . Hyperglycemia   . Hypertension   . Hypothyroidism   . Obesity   . Restless leg syndrome   . Tobacco abuse     Past Surgical History:  Procedure Laterality Date  . ABDOMINAL HYSTERECTOMY    .  CARDIAC CATHETERIZATION  2014  . CATARACT EXTRACTION W/PHACO Left 06/23/2016   Procedure: CATARACT EXTRACTION PHACO AND INTRAOCULAR LENS PLACEMENT (IOC);  Surgeon: Gemma PayorKerry Hunt, MD;  Location: [facility];  Service: Ophthalmology;  Laterality: Left;  left - pt knows to arrive at 10:50 cde 9.11  . CESAREAN SECTION    . CHOLECYSTECTOMY    . COLONOSCOPY WITH PROPOFOL N/A 10/14/2019   Procedure: COLONOSCOPY WITH PROPOFOL;  Surgeon: Corbin Adeourk,  Robert M, MD;  Location: [facility];  Service: Endoscopy;  Laterality: N/A;  8:00  . EXCISION MASS NECK     cyst  . FOOT MASS EXCISION    . left foot surgery     removal of cyst  . NECK SURGERY    . POLYPECTOMY  10/14/2019   Procedure: POLYPECTOMY;  Surgeon: Corbin Adeourk, Robert M, MD;  Location: [facility];  Service: Endoscopy;;  . THYROID SURGERY     Dr. Lovell SheehanJenkins  . TONSILLECTOMY      Current Outpatient Medications  Medication Sig Dispense Refill  . albuterol (PROVENTIL) (2.5 MG/3ML) 0.083% nebulizer solution Use 1 vial (3 mL) via nebulizer every 6 hours as needed for wheezing or shortness of breath 150 mL 5  . aspirin EC 81 MG tablet Take one po qd (Patient taking differently: Take 81 mg by mouth daily. ) 90 tablet 1  . budesonide (PULMICORT) 0.5 MG/2ML nebulizer solution Take 2 mLs (0.5 mg total) by nebulization 2 (two) times daily. 120 mL 3  . cyclobenzaprine (FLEXERIL) 10 MG tablet Take 1 tablet (10 mg total) by mouth 3 (three) times daily as needed for muscle spasms. 30 tablet 2  . gabapentin (NEURONTIN) 100 MG capsule Take 300 mg in AM, 100 mg in afternoon, and 300 mg at night. 210 capsule 1  . glimepiride (AMARYL) 2 MG tablet Take 1 tablet (2 mg total) by mouth daily with breakfast. 90 tablet 1  . Glycopyrrolate-Formoterol (BEVESPI AEROSPHERE) 9-4.8 MCG/ACT AERO Inhale 2 puffs into the lungs 2 (two) times daily. 5.9 g 0  . losartan (COZAAR) 25 MG tablet Take 1 tablet (25 mg total) by mouth daily. 90 tablet 1  . metFORMIN (GLUCOPHAGE) 1000 MG tablet Take 1 tablet (1,000 mg total) by mouth 2 (two) times daily with a meal. 180 tablet 1  . pravastatin (PRAVACHOL) 20 MG tablet Take 1 tablet (20 mg total) by mouth daily. 90 tablet 1  . SYNTHROID 112 MCG tablet Take 1 tablet (112 mcg total) by mouth daily before breakfast. 90 tablet 0   No current facility-administered medications for this visit.    Allergies as of 12/19/2019 - Review Complete 12/19/2019  Allergen Reaction Noted  .  Dilaudid [hydromorphone]  05/13/2016  . Hydromorphone  07/03/2012  . Valproic acid and related Nausea And Vomiting 07/03/2012    Family History  Adopted: Yes    Social History   Socioeconomic History  . Marital status: Widowed    Spouse name: Not on file  . Number of children: Not on file  . Years of education: Not on file  . Highest education level: Not on file  Occupational History  . Not on file  Tobacco Use  . Smoking status: Former Smoker    Packs/day: 1.00    Years: 25.00    Pack years: 25.00    Types: Cigarettes    Start date: 11/13/1988    Quit date: 10/28/2016    Years since quitting: 3.1  . Smokeless tobacco: Never Used  Vaping Use  . Vaping Use: Never  used  Substance and Sexual Activity  . Alcohol use: Yes    Comment: For special occasions, very rare   . Drug use: No  . Sexual activity: Never    Birth control/protection: Surgical  Other Topics Concern  . Not on file  Social History Narrative   [facility] (11/18/16):   Originally from [place] but grew up in [place]. Cared for her husband until he passed. 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 Does have significant dust exposure. Has 3 dogs currently. Previously had a parakeet as a child. No mold exposure. Enjoys playing with her grandchildren.        Social Determinants of Health   Financial Resource Strain:   . Difficulty of Paying Living Expenses: Not on file  Food Insecurity:   . Worried About Programme researcher, broadcasting/film/video in the Last Year: Not on file  . Ran Out of Food in the Last Year: Not on file  Transportation Needs:   . Lack of Transportation (Medical): Not on file  . Lack of Transportation (Non-Medical): Not on file  Physical Activity:   . Days of Exercise per Week: Not on file  . Minutes of Exercise per Session: Not on file  Stress:   . Feeling of Stress : Not on file  Social Connections:   . Frequency of Communication with Friends and Family: Not on  file  . Frequency of Social Gatherings with Friends and Family: Not on file  . Attends Religious Services: Not on file  . Active Member of Clubs or Organizations: Not on file  . Attends Banker Meetings: Not on file  . Marital Status: Not on file    Subjective: Review of Systems  Constitutional: Negative for chills, fever, malaise/fatigue and weight loss.  HENT: Negative for congestion and sore throat.   Respiratory: Negative for cough and shortness of breath.   Cardiovascular: Negative for chest pain and palpitations.  Gastrointestinal: Negative for abdominal pain, blood in stool, diarrhea, melena, nausea and vomiting.  Musculoskeletal: Negative for joint pain and myalgias.  Skin: Negative for rash.  Neurological: Negative for dizziness and weakness.  Endo/Heme/Allergies: Does not bruise/bleed easily.  Psychiatric/Behavioral: Negative for depression. The patient is not nervous/anxious.   All other systems reviewed and are negative.    Objective: BP (!) 143/84   Pulse 82   Temp (!) 97.2 F (36.2 C)   Ht 5\' 4"  (1.626 m)   Wt 217 lb (98.4 kg)   BMI 37.25 kg/m  Physical Exam Vitals and nursing note reviewed.  Constitutional:      General: She is not in acute distress.    Appearance: Normal appearance. She is well-developed. She is obese. She is not ill-appearing, toxic-appearing or diaphoretic.  HENT:     Head: Normocephalic and atraumatic.     Nose: No congestion or rhinorrhea.  Eyes:     General: No scleral icterus. Cardiovascular:     Rate and Rhythm: Normal rate and regular rhythm.     Heart sounds: Normal heart sounds.  Pulmonary:     Effort: Pulmonary effort is normal. No respiratory distress.     Breath sounds: Normal breath sounds.  Abdominal:     General: Bowel sounds are normal.     Palpations: Abdomen is soft. There is no hepatomegaly, splenomegaly or mass.     Tenderness: There is no abdominal tenderness. There is no guarding or rebound.      Hernia: No hernia is present.  Skin:    General: Skin is warm and dry.     Coloration: Skin is not jaundiced.     Findings: No rash.  Neurological:     General: No focal deficit present.     Mental Status: She is alert and oriented to person, place, and time.  Psychiatric:        Attention and Perception: Attention normal.        Mood and Affect: Mood normal.        Speech: Speech normal.        Behavior: Behavior normal.        Thought Content: Thought content normal.        Cognition and Memory: Cognition and memory normal.      Assessment:  Pleasant 49 year old female who presents for follow-up and fatty liver disease.  Recently updated LFTs show persistent though somewhat minimally improved, mildly elevated ALT.  Other LFTs normal.  Hemoglobin A1c stable at 7.3, but the patient states she has been on steroids recently.  She does exercise 30 minutes a day via walking in the evenings about 3 days a week.  She is unable to go to the gym right now because of COVID-19.  She does avoid greasy/fatty foods that could be linked to obesity and hypertriglyceridemia.  Her most recent triglycerides earlier this year are elevated, total cholesterol normal.  Metabolic syndrome factors include diabetes (medicated, not ideally controlled), hypertriglyceridemia, obesity, hypertension.  We discussed and reviewed dietary changes and lifestyle factors that can affect fatty liver disease.  We also discussed the path of physiology of fatty liver disease and potential progression into fibrosis and/or cirrhosis.  Offered dietitian consult if requested/needed at any point.  Recommend she continue with her lifestyle changes, continue to strive toward good blood pressure, glycemic, lipid control.  Follow-up in 6 months for update   Plan: 1. Continue current lifestyle changes 2. Continue to work towards glycemic control, blood pressure control, triglyceride control 3. Continue exercising 4. Avoid  greasy/fried/fatty foods 5. Follow-up in 6 months to update labs   Thank you for allowing [place] to participate in the care of Keairra M Judene Companion, DNP, AGNP-C Adult & Gerontological Nurse Practitioner [facility]   12/19/2019 8:55 AM   Disclaimer: This note was dictated with voice recognition software. Similar sounding words can inadvertently be transcribed and may not be corrected upon review.

## 2019-12-19 NOTE — Progress Notes (Signed)
Cc'ed to pcp °

## 2019-12-19 NOTE — Patient Instructions (Addendum)
Your health issues we discussed today were:   Fatty liver disease: 1. Continue your lifestyle changes including exercise, diet 2. Continue to work towards good blood sugar control, blood pressure control, triglyceride control 3. As we discussed, we could refer you to a dietitian at any point if you decide you would like the assistance 4. Call with any worsening or severe symptoms  Overall I recommend:  1. Continue your other current medications 2. Return for follow-up in 6 months to update labs and to touch base 3. Call us if you have any questions or concerns   At [facility] we value your feedback. You may receive a survey about your visit today. Please share your experience as we strive to create trusting relationships with our patients to provide genuine, compassionate, quality care.  We appreciate your understanding and patience as we review any laboratory studies, imaging, and other diagnostic tests that are ordered as we care for you. Our office policy is 5 business days for review of these results, and any emergent or urgent results are addressed in a timely manner for your best interest. If you do not hear from our office in 1 week, please contact us.   We also encourage the use of MyChart, which contains your medical information for your review as well. If you are not enrolled in this feature, an access code is on this after visit summary for your convenience. Thank you for allowing [place] to be involved in your care.  It was great to see you today!  I hope you have a great rest of your summer!!

## 2020-01-10 DIAGNOSIS — I10 Essential (primary) hypertension: Secondary | ICD-10-CM | POA: Diagnosis not present

## 2020-01-10 DIAGNOSIS — M48062 Spinal stenosis, lumbar region with neurogenic claudication: Secondary | ICD-10-CM | POA: Diagnosis not present

## 2020-01-10 DIAGNOSIS — M5416 Radiculopathy, lumbar region: Secondary | ICD-10-CM | POA: Diagnosis not present

## 2020-01-10 DIAGNOSIS — Z6837 Body mass index (BMI) 37.0-37.9, adult: Secondary | ICD-10-CM | POA: Diagnosis not present

## 2020-02-28 DIAGNOSIS — M5416 Radiculopathy, lumbar region: Secondary | ICD-10-CM | POA: Diagnosis not present

## 2020-03-11 DIAGNOSIS — I10 Essential (primary) hypertension: Secondary | ICD-10-CM | POA: Diagnosis not present

## 2020-03-11 DIAGNOSIS — M5416 Radiculopathy, lumbar region: Secondary | ICD-10-CM | POA: Diagnosis not present

## 2020-03-11 DIAGNOSIS — M48062 Spinal stenosis, lumbar region with neurogenic claudication: Secondary | ICD-10-CM | POA: Diagnosis not present

## 2020-03-11 DIAGNOSIS — Z6837 Body mass index (BMI) 37.0-37.9, adult: Secondary | ICD-10-CM | POA: Diagnosis not present

## 2020-03-12 ENCOUNTER — Telehealth: Payer: Self-pay

## 2020-03-12 NOTE — Telephone Encounter (Signed)
She says she feels like her heart is racing also. Is it normal for BP to go up after spinal injection? She called neuro and they advised to call PCP. She has 5 kids at home and can not come in.

## 2020-03-12 NOTE — Telephone Encounter (Signed)
Some times steroids can cause increase BP. Continue to monitor if continues to be elevated may need to be seen.

## 2020-03-12 NOTE — Telephone Encounter (Signed)
Patient aware and verbalizes understanding. 

## 2020-03-20 DIAGNOSIS — E890 Postprocedural hypothyroidism: Secondary | ICD-10-CM | POA: Diagnosis not present

## 2020-03-21 LAB — T4, FREE: Free T4: 1.5 ng/dL (ref 0.8–1.8)

## 2020-03-21 LAB — TSH: TSH: 0.87 mIU/L

## 2020-03-23 ENCOUNTER — Telehealth: Payer: Self-pay | Admitting: "Endocrinology

## 2020-03-23 ENCOUNTER — Other Ambulatory Visit: Payer: Self-pay | Admitting: Family

## 2020-03-23 MED ORDER — LOSARTAN POTASSIUM 100 MG PO TABS
100.0000 mg | ORAL_TABLET | Freq: Every day | ORAL | 1 refills | Status: DC
Start: 2020-03-23 — End: 2020-09-18

## 2020-03-23 NOTE — Telephone Encounter (Signed)
Pt called in and states she has a lot of congestion going on and would like to know if her appt 11/24 can be virtual

## 2020-03-23 NOTE — Telephone Encounter (Signed)
Yes

## 2020-03-24 ENCOUNTER — Telehealth (INDEPENDENT_AMBULATORY_CARE_PROVIDER_SITE_OTHER): Payer: Medicaid Other | Admitting: Family

## 2020-03-24 ENCOUNTER — Encounter: Payer: Self-pay | Admitting: Family

## 2020-03-24 DIAGNOSIS — J441 Chronic obstructive pulmonary disease with (acute) exacerbation: Secondary | ICD-10-CM

## 2020-03-24 DIAGNOSIS — E1159 Type 2 diabetes mellitus with other circulatory complications: Secondary | ICD-10-CM | POA: Diagnosis not present

## 2020-03-24 DIAGNOSIS — I152 Hypertension secondary to endocrine disorders: Secondary | ICD-10-CM | POA: Diagnosis not present

## 2020-03-24 MED ORDER — PREDNISONE 10 MG (21) PO TBPK: ORAL_TABLET | ORAL | 0 refills | Status: DC

## 2020-03-24 NOTE — Progress Notes (Signed)
Virtual Visit via telephone Note Due to COVID-19 pandemic this visit was conducted virtually. This visit type was conducted due to national recommendations for restrictions regarding the COVID-19 Pandemic (e.g. social distancing, sheltering in place) in an effort to limit this patient's exposure and mitigate transmission in our community. All issues noted in this document were discussed and addressed.  A physical exam was not performed with this format.  I connected with Sherry Glass on 03/24/20 at 12:13 pm by telephone and verified that I am speaking with the correct person using two identifiers. Sherry Glass is currently located at home and no one is currently with her during visit. The provider, Evelina Dun, [name] is located in their office at time of visit.  I discussed the limitations, risks, security and privacy concerns of performing an evaluation and management service by telephone and the availability of in person appointments. I also discussed with the patient that there may be a patient responsible charge related to this service. The patient expressed understanding and agreed to proceed.   History and Present Illness:  Cough This is a new problem. The current episode started 1 to 4 weeks ago. The problem has been gradually worsening. The problem occurs every few minutes. The cough is productive of purulent sputum. Associated symptoms include headaches, nasal congestion, postnasal drip and wheezing. Pertinent negatives include no chills, ear congestion, ear pain, fever, myalgias or shortness of breath (Nothing new from baseline). The symptoms are aggravated by lying down. She has tried OTC cough suppressant and rest for the symptoms. The treatment provided mild relief.  Hypertension This is a chronic problem. The current episode started more than 1 year ago. The problem has been waxing and waning since onset. Associated symptoms include headaches. Pertinent negatives include no  malaise/fatigue, peripheral edema or shortness of breath (Nothing new from baseline). The current treatment provides moderate improvement.      Review of Systems  Constitutional: Negative for chills, fever and malaise/fatigue.  HENT: Positive for postnasal drip. Negative for ear pain.   Respiratory: Positive for cough and wheezing. Negative for shortness of breath (Nothing new from baseline).   Musculoskeletal: Negative for myalgias.  Neurological: Positive for headaches.  All other systems reviewed and are negative.    Observations/Objective: No SOB or distress noted, hoarse voice.   Assessment and Plan: 1. Chronic obstructive pulmonary disease with acute exacerbation (HCC) Rest Force fluids Start prednisone as needed If symptoms worsen or do not improve will send in zpak  - BMP8+EGFR; Future - predniSONE (STERAPRED UNI-PAK 21 TAB) 10 MG (21) TBPK tablet; Use as directed  Dispense: 21 tablet; Refill: 0  2. Hypertension associated with diabetes ([place]) Much better since increasing losartan to 100 mg Pt will come have lab work drawn in two weeks to check  - BMP8+EGFR; Future     I discussed the assessment and treatment plan with the patient. The patient was provided an opportunity to ask questions and all were answered. The patient agreed with the plan and demonstrated an understanding of the instructions.   The patient was advised to call back or seek an in-person evaluation if the symptoms worsen or if the condition fails to improve as anticipated.  The above assessment and management plan was discussed with the patient. The patient verbalized understanding of and has agreed to the management plan. Patient is aware to call the clinic if symptoms persist or worsen. Patient is aware when to return to the clinic for a follow-up visit.  Patient educated on when it is appropriate to go to the emergency department.   Time call ended:  12:24 pm  I provided 11 minutes of  non-face-to-face time during this encounter.    Evelina Dun, [name]

## 2020-03-25 ENCOUNTER — Telehealth (INDEPENDENT_AMBULATORY_CARE_PROVIDER_SITE_OTHER): Payer: Medicaid Other | Admitting: "Endocrinology

## 2020-03-25 ENCOUNTER — Encounter: Payer: Self-pay | Admitting: "Endocrinology

## 2020-03-25 DIAGNOSIS — E890 Postprocedural hypothyroidism: Secondary | ICD-10-CM | POA: Diagnosis not present

## 2020-03-25 MED ORDER — LEVOTHYROXINE SODIUM 100 MCG PO TABS: 100.0000 ug | ORAL_TABLET | Freq: Every day | ORAL | 1 refills | Status: DC

## 2020-03-25 NOTE — Progress Notes (Signed)
03/25/2020                                  Endocrinology Telehealth Visit Follow up Note -During COVID -19 Pandemic  This visit type was conducted  via telephone due to national recommendations for restrictions regarding the COVID-19 Pandemic  in an effort to limit this patient's exposure and mitigate transmission of the corona virus.   I connected with Sherry Glass on 03/25/2020   by telephone and verified that I am speaking with the correct person using two identifiers. Sherry Glass, 29-May-1970. she has verbally consented to this visit.  I was in my office and patient was in her residence. No other persons were with me during the encounter. All issues noted in this document were discussed and addressed. The format was not optimal for physical exam.    Subjective:    Patient ID: Sherry Glass, female    DOB: 1970/05/26, PCP Junie Spencer, [name].   Past Medical History:  Diagnosis Date  . Allergic rhinitis   . Anxiety and depression   . Cataract   . Chest pain syndrome   . COPD (chronic obstructive pulmonary disease) (HCC)   . Depression   . Diabetes mellitus without complication (HCC)   . History of kidney stones   . HTN (hypertension)   . Hypercholesterolemia   . Hyperglycemia   . Hypertension   . Hypothyroidism   . Obesity   . Restless leg syndrome   . Tobacco abuse    Past Surgical History:  Procedure Laterality Date  . ABDOMINAL HYSTERECTOMY    . CARDIAC CATHETERIZATION  2014  . CATARACT EXTRACTION W/PHACO Left 06/23/2016   Procedure: CATARACT EXTRACTION PHACO AND INTRAOCULAR LENS PLACEMENT (IOC);  Surgeon: Gemma Payor, MD;  Location: [facility];  Service: Ophthalmology;  Laterality: Left;  left - pt knows to arrive at 10:50 cde 9.11  . CESAREAN SECTION    . CHOLECYSTECTOMY    . COLONOSCOPY WITH PROPOFOL N/A 10/14/2019   Procedure: COLONOSCOPY WITH PROPOFOL;  Surgeon: Corbin Ade, MD;  Location: [facility];  Service: Endoscopy;  Laterality: N/A;  8:00  .  EXCISION MASS NECK     cyst  . FOOT MASS EXCISION    . left foot surgery     removal of cyst  . NECK SURGERY    . POLYPECTOMY  10/14/2019   Procedure: POLYPECTOMY;  Surgeon: Corbin Ade, MD;  Location: [facility];  Service: Endoscopy;;  . THYROID SURGERY     Dr. Lovell Sheehan  . TONSILLECTOMY     Social History   Socioeconomic History  . Marital status: Widowed    Spouse name: Not on file  . Number of children: Not on file  . Years of education: Not on file  . Highest education level: Not on file  Occupational History  . Not on file  Tobacco Use  . Smoking status: Former Smoker    Packs/day: 1.00    Years: 25.00    Pack years: 25.00    Types: Cigarettes    Start date: 11/13/1988    Quit date: 10/28/2016    Years since quitting: 3.4  . Smokeless tobacco: Never Used  Vaping Use  . Vaping Use: Never used  Substance and Sexual Activity  . Alcohol use: Yes    Comment: For special occasions, very rare   . Drug use: No  . Sexual  activity: Never    Birth control/protection: Surgical  Other Topics Concern  . Not on file  Social History Narrative   [facility] (11/18/16):   Originally from [place] but grew up in [place]. Cared for her husband until he passed. 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 Does have significant dust exposure. Has 3 dogs currently. Previously had a parakeet as a child. No mold exposure. Enjoys playing with her grandchildren.        Social Determinants of Health   Financial Resource Strain:   . Difficulty of Paying Living Expenses: Not on file  Food Insecurity:   . Worried About Programme researcher, broadcasting/film/video in the Last Year: Not on file  . Ran Out of Food in the Last Year: Not on file  Transportation Needs:   . Lack of Transportation (Medical): Not on file  . Lack of Transportation (Non-Medical): Not on file  Physical Activity:   . Days of Exercise per Week: Not on file  . Minutes of Exercise per Session: Not on  file  Stress:   . Feeling of Stress : Not on file  Social Connections:   . Frequency of Communication with Friends and Family: Not on file  . Frequency of Social Gatherings with Friends and Family: Not on file  . Attends Religious Services: Not on file  . Active Member of Clubs or Organizations: Not on file  . Attends Banker Meetings: Not on file  . Marital Status: Not on file   Outpatient Encounter Medications as of 03/25/2020  Medication Sig  . albuterol (PROVENTIL) (2.5 MG/3ML) 0.083% nebulizer solution Use 1 vial (3 mL) via nebulizer every 6 hours as needed for wheezing or shortness of breath  . aspirin EC 81 MG tablet Take one po qd (Patient taking differently: Take 81 mg by mouth daily. )  . budesonide (PULMICORT) 0.5 MG/2ML nebulizer solution Take 2 mLs (0.5 mg total) by nebulization 2 (two) times daily.  . cyclobenzaprine (FLEXERIL) 10 MG tablet Take 1 tablet (10 mg total) by mouth 3 (three) times daily as needed for muscle spasms.  Marland Kitchen gabapentin (NEURONTIN) 100 MG capsule Take 300 mg in AM, 100 mg in afternoon, and 300 mg at night.  Marland Kitchen glimepiride (AMARYL) 2 MG tablet Take 1 tablet (2 mg total) by mouth daily with breakfast.  . Glycopyrrolate-Formoterol (BEVESPI AEROSPHERE) 9-4.8 MCG/ACT AERO Inhale 2 puffs into the lungs 2 (two) times daily.  Marland Kitchen levothyroxine (SYNTHROID) 100 MCG tablet Take 1 tablet (100 mcg total) by mouth daily before breakfast.  . losartan (COZAAR) 100 MG tablet Take 1 tablet (100 mg total) by mouth daily.  . metFORMIN (GLUCOPHAGE) 1000 MG tablet Take 1 tablet (1,000 mg total) by mouth 2 (two) times daily with a meal.  . pravastatin (PRAVACHOL) 20 MG tablet Take 1 tablet (20 mg total) by mouth daily.  . predniSONE (STERAPRED UNI-PAK 21 TAB) 10 MG (21) TBPK tablet Use as directed  . [DISCONTINUED] SYNTHROID 112 MCG tablet Take 1 tablet (112 mcg total) by mouth daily before breakfast.   No facility-administered encounter medications on file as of  03/25/2020.   ALLERGIES: Allergies  Allergen Reactions  . Dilaudid [Hydromorphone]     Itching and vomitting  . Hydromorphone   . Valproic Acid And Related Nausea And Vomiting   VACCINATION STATUS: Immunization History  Administered Date(s) Administered  . Influenza,inj,Quad PF,6+ Mos 04/27/2017  . Pneumococcal Polysaccharide-23 05/02/2005    HPI  Mrs. Tigue is status  post radioactive iodine thyroid ablation on June 29, 2018.  She is being engaged in telehealth in follow-up of RAI induced hypothyroidism.  She is currently on levothyroxine 100 mcg p.o. daily before breakfast.  She reports compliance to his medication.  She has no new complaints today.   She reports steady weight.  Her previsit labs are consistent with appropriate replacement.  The patient denies family history of thyroid dysfunction- she is adopted. -She denies palpitations, tremors, nor heat intolerance. - She has well-controlled type 2 diabetes with A1c of 7%, following with her PMD, taking Metformin, glimepiride, and Jardiance.    - She reports that she has quit smoking after decades of heavy smoking.     Review of Systems. Limited as above   Objective:    There were no vitals taken for this visit.  Wt Readings from Last 3 Encounters:  12/19/19 217 lb (98.4 kg)  12/10/19 216 lb 9.6 oz (98.2 kg)  10/14/19 205 lb (93 kg)    Physical Exam   Lipid Panel     Component Value Date/Time   CHOL 177 05/09/2019 1201   TRIG 275 (H) 05/09/2019 1201   HDL 49 05/09/2019 1201   CHOLHDL 3.6 05/09/2019 1201   LDLCALC 83 05/09/2019 1201   Recent Results (from the past 2160 hour(s))  TSH     Status: None   Collection Time: 03/20/20 10:57 AM  Result Value Ref Range   TSH 0.87 mIU/L    Comment:           Reference Range .           > or = 20 Years  0.40-4.50 .                Pregnancy Ranges           First trimester    0.26-2.66           Second trimester   0.55-2.73           Third trimester     0.43-2.91   T4, free     Status: None   Collection Time: 03/20/20 10:57 AM  Result Value Ref Range   Free T4 1.5 0.8 - 1.8 ng/dL     - Thyroid uptake and scan on 07/19/2016 was nonfocal with 25% uptake and 24 hours.  March 05, 2018 thyroid uptake and scan: FINDINGS: Normal thyroid scan.  No hot or cold nodules are identified.  4 hour I-131 uptake = 17.6% (normal 5-20%) 24 hour I-131 uptake = 30.0% (normal 10-30%)  IMPRESSION: 1. Normal thyroid scan. 2. Top-normal 24 hour iodine 131 uptake at 30%.  I-131 thyroid ablation on June 29, 2018.    Assessment & Plan:   1.  RAI induced hypothyroidism  2.  Type 2 diabetes -She is status post thyroid ablation with I-131 for significant T3 toxicosis.  Treatment was administered on June 29, 2018.   -Her previsit thyroid function tests are consistent with appropriate replacement.  She is advised to continue levothyroxine 100 mcg p.o. daily before breakfast.    - We discussed about the correct intake of her thyroid hormone, on empty stomach at fasting, with water, separated by at least 30 minutes from breakfast and other medications,  and separated by more than 4 hours from calcium, iron, multivitamins, acid reflux medications (PPIs). -Patient is made aware of the fact that thyroid hormone replacement is needed for life, dose to be adjusted by periodic monitoring of thyroid function tests.  She has type 2 diabetes with recent A1c of 7%.  She is advised to continue metformin 1000 mg p.o. twice daily and glimepiride 2 mg p.o. daily at breakfast.   She wishes to continue follow-up with her PCP for diabetes care.  - I advised patient to maintain close follow up with [name], [name], [name] for primary care needs, including diabetes care.       - Time spent on this patient care encounter:  20 minutes of which 50% was spent in  counseling and the rest reviewing  her current and  previous labs / studies and medications  doses and  developing a plan for long term care. Sherry Florasonia M Glass  participated in the discussions, expressed understanding, and voiced agreement with the above plans.  All questions were answered to her satisfaction. she is encouraged to contact clinic should she have any questions or concerns prior to her return visit.   Follow up plan: Return in about 6 months (around 09/22/2020) for F/U with Pre-visit Labs.  Marquis LunchGebre Taijon Vink, MD Phone: [phone]  Fax: [phone]   03/25/2020, 3:01 PM

## 2020-03-27 ENCOUNTER — Ambulatory Visit: Payer: Medicaid Other | Admitting: "Endocrinology

## 2020-03-30 ENCOUNTER — Other Ambulatory Visit: Payer: Self-pay | Admitting: Family

## 2020-03-30 MED ORDER — DOXYCYCLINE HYCLATE 100 MG PO TABS
100.0000 mg | ORAL_TABLET | Freq: Two times a day (BID) | ORAL | 0 refills | Status: DC
Start: 2020-03-30 — End: 2020-05-19

## 2020-04-15 DIAGNOSIS — I10 Essential (primary) hypertension: Secondary | ICD-10-CM | POA: Diagnosis not present

## 2020-04-15 DIAGNOSIS — M47816 Spondylosis without myelopathy or radiculopathy, lumbar region: Secondary | ICD-10-CM | POA: Diagnosis not present

## 2020-04-15 DIAGNOSIS — Z6836 Body mass index (BMI) 36.0-36.9, adult: Secondary | ICD-10-CM | POA: Diagnosis not present

## 2020-05-08 ENCOUNTER — Other Ambulatory Visit: Payer: Self-pay | Admitting: Family

## 2020-05-08 DIAGNOSIS — E785 Hyperlipidemia, unspecified: Secondary | ICD-10-CM

## 2020-05-08 DIAGNOSIS — M5440 Lumbago with sciatica, unspecified side: Secondary | ICD-10-CM

## 2020-05-08 DIAGNOSIS — G8929 Other chronic pain: Secondary | ICD-10-CM

## 2020-05-11 MED ORDER — OLMESARTAN MEDOXOMIL 40 MG PO TABS: 40.0000 mg | ORAL_TABLET | Freq: Every day | ORAL | 0 refills | Status: DC

## 2020-05-13 LAB — HM DIABETES EYE EXAM

## 2020-05-19 ENCOUNTER — Ambulatory Visit (INDEPENDENT_AMBULATORY_CARE_PROVIDER_SITE_OTHER): Payer: Medicaid Other | Admitting: Family

## 2020-05-19 ENCOUNTER — Encounter: Payer: Self-pay | Admitting: Family

## 2020-05-19 VITALS — BP 130/70

## 2020-05-19 DIAGNOSIS — B373 Candidiasis of vulva and vagina: Secondary | ICD-10-CM | POA: Diagnosis not present

## 2020-05-19 DIAGNOSIS — I152 Hypertension secondary to endocrine disorders: Secondary | ICD-10-CM

## 2020-05-19 DIAGNOSIS — K219 Gastro-esophageal reflux disease without esophagitis: Secondary | ICD-10-CM | POA: Diagnosis not present

## 2020-05-19 DIAGNOSIS — F411 Generalized anxiety disorder: Secondary | ICD-10-CM

## 2020-05-19 DIAGNOSIS — E890 Postprocedural hypothyroidism: Secondary | ICD-10-CM | POA: Diagnosis not present

## 2020-05-19 DIAGNOSIS — R0683 Snoring: Secondary | ICD-10-CM

## 2020-05-19 DIAGNOSIS — E1159 Type 2 diabetes mellitus with other circulatory complications: Secondary | ICD-10-CM

## 2020-05-19 DIAGNOSIS — J441 Chronic obstructive pulmonary disease with (acute) exacerbation: Secondary | ICD-10-CM | POA: Diagnosis not present

## 2020-05-19 DIAGNOSIS — E1165 Type 2 diabetes mellitus with hyperglycemia: Secondary | ICD-10-CM

## 2020-05-19 DIAGNOSIS — F320 Major depressive disorder, single episode, mild: Secondary | ICD-10-CM | POA: Diagnosis not present

## 2020-05-19 DIAGNOSIS — E559 Vitamin D deficiency, unspecified: Secondary | ICD-10-CM | POA: Diagnosis not present

## 2020-05-19 DIAGNOSIS — E1169 Type 2 diabetes mellitus with other specified complication: Secondary | ICD-10-CM

## 2020-05-19 DIAGNOSIS — E785 Hyperlipidemia, unspecified: Secondary | ICD-10-CM

## 2020-05-19 DIAGNOSIS — B3731 Acute candidiasis of vulva and vagina: Secondary | ICD-10-CM

## 2020-05-19 MED ORDER — OZEMPIC (0.25 OR 0.5 MG/DOSE) 2 MG/1.5ML ~~LOC~~ SOPN: PEN_INJECTOR | SUBCUTANEOUS | 0 refills | Status: DC

## 2020-05-19 MED ORDER — FLUCONAZOLE 150 MG PO TABS: 150.0000 mg | ORAL_TABLET | ORAL | 0 refills | Status: DC | PRN

## 2020-05-19 NOTE — Progress Notes (Signed)
Virtual Visit via telephone Note Due to COVID-19 pandemic this visit was conducted virtually. This visit type was conducted due to national recommendations for restrictions regarding the COVID-19 Pandemic (e.g. social distancing, sheltering in place) in an effort to limit this patient's exposure and mitigate transmission in our community. All issues noted in this document were discussed and addressed.  A physical exam was not performed with this format.  I connected with Sherry Glass on 05/19/20 at 10:46 AM  by telephone and verified that I am speaking with the correct person using two identifiers. Sherry Glass is currently located at home and no one is currently with her during visit. The provider, Evelina Dun, [name] is located in their office at time of visit.  I discussed the limitations, risks, security and privacy concerns of performing an evaluation and management service by telephone and the availability of in person appointments. I also discussed with the patient that there may be a patient responsible charge related to this service. The patient expressed understanding and agreed to proceed.   History and Present Illness:  Pt presents to the office today for chronic follow up. Pt is followed by Pulmonologist annuallyfor COPD, Restrictive lung disease, and SOB. Followed by Endocrinologists annually  hypothyroidism. She had thyroid ablation.  She is followed by Neurosurgereon chronic back pain. She is currently getting steroid injections every 3 weeks.  Diabetes She presents for her follow-up diabetic visit. She has type 2 diabetes mellitus. Her disease course has been stable. Hypoglycemia symptoms include nervousness/anxiousness. Associated symptoms include foot paresthesias. Pertinent negatives for diabetes include no blurred vision and no fatigue. Symptoms are stable. Diabetic complications include heart disease and peripheral neuropathy. Pertinent negatives for diabetic complications  include no CVA or nephropathy. Risk factors for coronary artery disease include dyslipidemia, diabetes mellitus, hypertension and sedentary lifestyle. She is following a generally unhealthy diet. Her overall blood glucose range is 180-200 mg/dl. An ACE inhibitor/angiotensin II receptor blocker is being taken.  Hypertension This is a chronic problem. The current episode started more than 1 year ago. The problem has been waxing and waning since onset. Associated symptoms include anxiety. Pertinent negatives include no blurred vision, malaise/fatigue, peripheral edema or shortness of breath. Risk factors for coronary artery disease include dyslipidemia, diabetes mellitus, obesity and sedentary lifestyle. The current treatment provides moderate improvement. There is no history of CVA or heart failure. Identifiable causes of hypertension include a thyroid problem.  Gastroesophageal Reflux She complains of belching, heartburn and a hoarse voice. This is a chronic problem. The current episode started more than 1 year ago. The problem occurs rarely. Pertinent negatives include no fatigue. Risk factors include obesity. She has tried a PPI for the symptoms. The treatment provided moderate relief.  Thyroid Problem Presents for follow-up visit. Symptoms include anxiety and hoarse voice. Patient reports no depressed mood, diaphoresis, dry skin or fatigue. The symptoms have been stable. Her past medical history is significant for hyperlipidemia. There is no history of heart failure.  Hyperlipidemia This is a chronic problem. The current episode started more than 1 year ago. Exacerbating diseases include obesity. Pertinent negatives include no shortness of breath. Current antihyperlipidemic treatment includes statins. The current treatment provides moderate improvement of lipids. Risk factors for coronary artery disease include dyslipidemia, diabetes mellitus, hypertension and a sedentary lifestyle.  Anxiety Presents for  follow-up visit. Symptoms include excessive worry, insomnia, irritability, nervous/anxious behavior and restlessness. Patient reports no depressed mood, panic or shortness of breath. Symptoms occur most days. The  quality of sleep is good.    Depression        This is a chronic problem.  The current episode started more than 1 year ago.   The onset quality is gradual.   The problem occurs intermittently.  Associated symptoms include insomnia, irritable, restlessness and sad.  Associated symptoms include no fatigue, no helplessness and no hopelessness.  Past medical history includes thyroid problem and anxiety.   Vaginal Discharge The patient's primary symptoms include genital itching and vaginal discharge. The patient's pertinent negatives include no genital odor. This is a new problem. The current episode started 1 to 4 weeks ago. The problem occurs intermittently. She has tried antifungals for the symptoms. The treatment provided mild relief.      Review of Systems  Constitutional: Positive for irritability. Negative for diaphoresis, fatigue and malaise/fatigue.  HENT: Positive for hoarse voice.   Eyes: Negative for blurred vision.  Respiratory: Negative for shortness of breath.   Gastrointestinal: Positive for heartburn.  Genitourinary: Positive for vaginal discharge.  Psychiatric/Behavioral: Positive for depression. The patient is nervous/anxious and has insomnia.   All other systems reviewed and are negative.    Observations/Objective: No SOB or distress noted   Assessment and Plan: Sherry Glass comes in today with chief complaint of No chief complaint on file.   Diagnosis and orders addressed:  1. Hypertension associated with diabetes ([place]) - CMP14+EGFR; Future - CBC with Differential/Platelet; Future  2. Chronic obstructive pulmonary disease with acute exacerbation (HCC) - CMP14+EGFR; Future - CBC with Differential/Platelet; Future  3. Gastroesophageal reflux disease,  unspecified whether esophagitis presen - CMP14+EGFR; Future - CBC with Differential/Platelet; Future  4. Uncontrolled type 2 diabetes mellitus with hyperglycemia ([place]) -Will start Ozempic today -Low carb diet  - Semaglutide,0.25 or 0.5MG/DOS, (OZEMPIC, 0.25 OR 0.5 MG/DOSE,) 2 MG/1.5ML SOPN; Inject 0.25 mg into the skin once a week for 28 days, THEN 0.5 mg once a week for 28 days.  Dispense: 2.25 mL; Refill: 0 - CMP14+EGFR; Future - CBC with Differential/Platelet; Future - Bayer DCA Hb A1c Waived; Future  5. Hypothyroidism following radioiodine therapy - CMP14+EGFR; Future - CBC with Differential/Platelet; Future - TSH; Future  6. Hyperlipidemia associated with type 2 diabetes mellitus (HCC) - CMP14+EGFR; Future - CBC with Differential/Platelet; Future  7. Morbid obesity ([place]) - CMP14+EGFR; Future - CBC with Differential/Platelet; Future  8. Snoring - CMP14+EGFR; Future - CBC with Differential/Platelet; Future  9. Vitamin D deficiency - CMP14+EGFR; Future - CBC with Differential/Platelet; Future  10. Depression, major, single episode, mild (HCC) - CMP14+EGFR; Future - CBC with Differential/Platelet; Future  11. GAD (generalized anxiety disorder) - CMP14+EGFR; Future - CBC with Differential/Platelet; Future  12. Vagina, candidiasis - fluconazole (DIFLUCAN) 150 MG tablet; Take 1 tablet (150 mg total) by mouth every three (3) days as needed.  Dispense: 3 tablet; Refill: 0 - CMP14+EGFR; Future - CBC with Differential/Platelet; Future   Labs pending Health Maintenance reviewed Diet and exercise encouraged  Follow up plan: 1 months       I discussed the assessment and treatment plan with the patient. The patient was provided an opportunity to ask questions and all were answered. The patient agreed with the plan and demonstrated an understanding of the instructions.   The patient was advised to call back or seek an in-person evaluation if the symptoms worsen or if  the condition fails to improve as anticipated.  The above assessment and management plan was discussed with the patient. The patient verbalized understanding of and  has agreed to the management plan. Patient is aware to call the clinic if symptoms persist or worsen. Patient is aware when to return to the clinic for a follow-up visit. Patient educated on when it is appropriate to go to the emergency department.   Time call ended:  11:07 AM   I provided  22 minutes of non-face-to-face time during this encounter.    Evelina Dun, [name]

## 2020-05-20 ENCOUNTER — Telehealth: Payer: Self-pay | Admitting: *Deleted

## 2020-05-20 NOTE — Telephone Encounter (Signed)
PA in process  (Key: [id number]) Rx #: [id number] Ozempic (0.25 or 0.5 MG/DOSE) 2MG /1.5ML pen-injectors

## 2020-05-21 ENCOUNTER — Other Ambulatory Visit: Payer: Medicaid Other

## 2020-05-21 DIAGNOSIS — I152 Hypertension secondary to endocrine disorders: Secondary | ICD-10-CM | POA: Diagnosis not present

## 2020-05-21 DIAGNOSIS — B373 Candidiasis of vulva and vagina: Secondary | ICD-10-CM

## 2020-05-21 DIAGNOSIS — E559 Vitamin D deficiency, unspecified: Secondary | ICD-10-CM | POA: Diagnosis not present

## 2020-05-21 DIAGNOSIS — F320 Major depressive disorder, single episode, mild: Secondary | ICD-10-CM | POA: Diagnosis not present

## 2020-05-21 DIAGNOSIS — E785 Hyperlipidemia, unspecified: Secondary | ICD-10-CM | POA: Diagnosis not present

## 2020-05-21 DIAGNOSIS — F411 Generalized anxiety disorder: Secondary | ICD-10-CM

## 2020-05-21 DIAGNOSIS — E1165 Type 2 diabetes mellitus with hyperglycemia: Secondary | ICD-10-CM

## 2020-05-21 DIAGNOSIS — E890 Postprocedural hypothyroidism: Secondary | ICD-10-CM | POA: Diagnosis not present

## 2020-05-21 DIAGNOSIS — K219 Gastro-esophageal reflux disease without esophagitis: Secondary | ICD-10-CM | POA: Diagnosis not present

## 2020-05-21 DIAGNOSIS — E1169 Type 2 diabetes mellitus with other specified complication: Secondary | ICD-10-CM

## 2020-05-21 DIAGNOSIS — E1159 Type 2 diabetes mellitus with other circulatory complications: Secondary | ICD-10-CM

## 2020-05-21 DIAGNOSIS — R0683 Snoring: Secondary | ICD-10-CM | POA: Diagnosis not present

## 2020-05-21 DIAGNOSIS — B3731 Acute candidiasis of vulva and vagina: Secondary | ICD-10-CM

## 2020-05-21 DIAGNOSIS — J441 Chronic obstructive pulmonary disease with (acute) exacerbation: Secondary | ICD-10-CM

## 2020-05-21 LAB — BAYER DCA HB A1C WAIVED: HB A1C (BAYER DCA - WAIVED): 10.1 % — ABNORMAL HIGH (ref <7.0)

## 2020-05-21 MED ORDER — TRULICITY 0.75 MG/0.5ML ~~LOC~~ SOAJ: 0.7500 mg | SUBCUTANEOUS | 1 refills | Status: DC

## 2020-05-21 NOTE — Telephone Encounter (Signed)
Patient aware.

## 2020-05-21 NOTE — Telephone Encounter (Signed)
PA denied patient must try and fail two formulary medications trulicity, victoza, bydureon

## 2020-05-21 NOTE — Telephone Encounter (Signed)
Ozempic changed to Trulicity per insurance. Still once weekly injection.

## 2020-05-22 DIAGNOSIS — I10 Essential (primary) hypertension: Secondary | ICD-10-CM | POA: Diagnosis not present

## 2020-05-22 DIAGNOSIS — M47816 Spondylosis without myelopathy or radiculopathy, lumbar region: Secondary | ICD-10-CM | POA: Diagnosis not present

## 2020-05-22 DIAGNOSIS — Z6837 Body mass index (BMI) 37.0-37.9, adult: Secondary | ICD-10-CM | POA: Diagnosis not present

## 2020-05-22 LAB — CMP14+EGFR
ALT: 42 IU/L — ABNORMAL HIGH (ref 0–32)
AST: 33 IU/L (ref 0–40)
Albumin/Globulin Ratio: 1.6 (ref 1.2–2.2)
Albumin: 4.4 g/dL (ref 3.8–4.9)
Alkaline Phosphatase: 102 IU/L (ref 44–121)
BUN/Creatinine Ratio: 13 (ref 9–23)
BUN: 9 mg/dL (ref 6–24)
Bilirubin Total: 0.7 mg/dL (ref 0.0–1.2)
CO2: 23 mmol/L (ref 20–29)
Calcium: 10.1 mg/dL (ref 8.7–10.2)
Chloride: 100 mmol/L (ref 96–106)
Creatinine, Ser: 0.72 mg/dL (ref 0.57–1.00)
GFR calc Af Amer: 112 mL/min/{1.73_m2} (ref >59)
GFR calc non Af Amer: 97 mL/min/{1.73_m2} (ref >59)
Globulin, Total: 2.8 g/dL (ref 1.5–4.5)
Glucose: 221 mg/dL — ABNORMAL HIGH (ref 65–99)
Potassium: 5 mmol/L (ref 3.5–5.2)
Sodium: 141 mmol/L (ref 134–144)
Total Protein: 7.2 g/dL (ref 6.0–8.5)

## 2020-05-22 LAB — TSH: TSH: 0.783 u[IU]/mL (ref 0.450–4.500)

## 2020-05-22 LAB — CBC WITH DIFFERENTIAL/PLATELET
Basophils Absolute: 0.1 10*3/uL (ref 0.0–0.2)
Basos: 1 %
EOS (ABSOLUTE): 0.2 10*3/uL (ref 0.0–0.4)
Eos: 2 %
Hematocrit: 44.8 % (ref 34.0–46.6)
Hemoglobin: 15 g/dL (ref 11.1–15.9)
Immature Grans (Abs): 0 10*3/uL (ref 0.0–0.1)
Immature Granulocytes: 0 %
Lymphocytes Absolute: 4.6 10*3/uL — ABNORMAL HIGH (ref 0.7–3.1)
Lymphs: 42 %
MCH: 32 pg (ref 26.6–33.0)
MCHC: 33.5 g/dL (ref 31.5–35.7)
MCV: 96 fL (ref 79–97)
Monocytes Absolute: 0.6 10*3/uL (ref 0.1–0.9)
Monocytes: 6 %
Neutrophils Absolute: 5.4 10*3/uL (ref 1.4–7.0)
Neutrophils: 49 %
Platelets: 292 10*3/uL (ref 150–450)
RBC: 4.69 x10E6/uL (ref 3.77–5.28)
RDW: 12.2 % (ref 11.7–15.4)
WBC: 10.9 10*3/uL — ABNORMAL HIGH (ref 3.4–10.8)

## 2020-06-03 DIAGNOSIS — M47816 Spondylosis without myelopathy or radiculopathy, lumbar region: Secondary | ICD-10-CM | POA: Diagnosis not present

## 2020-06-03 DIAGNOSIS — I10 Essential (primary) hypertension: Secondary | ICD-10-CM | POA: Diagnosis not present

## 2020-06-03 DIAGNOSIS — Z6836 Body mass index (BMI) 36.0-36.9, adult: Secondary | ICD-10-CM | POA: Diagnosis not present

## 2020-06-04 ENCOUNTER — Encounter: Payer: Self-pay | Admitting: Internal Medicine

## 2020-06-26 ENCOUNTER — Other Ambulatory Visit: Payer: Self-pay | Admitting: Family

## 2020-07-10 ENCOUNTER — Other Ambulatory Visit: Payer: Self-pay | Admitting: Family

## 2020-07-16 ENCOUNTER — Encounter: Payer: Self-pay | Admitting: Family

## 2020-07-16 ENCOUNTER — Ambulatory Visit (INDEPENDENT_AMBULATORY_CARE_PROVIDER_SITE_OTHER): Payer: Medicaid Other | Admitting: Family

## 2020-07-16 DIAGNOSIS — J441 Chronic obstructive pulmonary disease with (acute) exacerbation: Secondary | ICD-10-CM

## 2020-07-16 MED ORDER — AZITHROMYCIN 250 MG PO TABS: ORAL_TABLET | ORAL | 0 refills | Status: DC

## 2020-07-16 MED ORDER — PREDNISONE 10 MG (21) PO TBPK: ORAL_TABLET | ORAL | 0 refills | Status: DC

## 2020-07-16 MED ORDER — ALBUTEROL SULFATE HFA 108 (90 BASE) MCG/ACT IN AERS: 2.0000 | INHALATION_SPRAY | Freq: Four times a day (QID) | RESPIRATORY_TRACT | 2 refills | Status: DC | PRN

## 2020-07-16 MED ORDER — ALBUTEROL SULFATE (2.5 MG/3ML) 0.083% IN NEBU
INHALATION_SOLUTION | RESPIRATORY_TRACT | 5 refills | Status: DC
Start: 2020-07-16 — End: 2020-09-18

## 2020-07-16 NOTE — Progress Notes (Signed)
Virtual Visit via telephone Note Due to COVID-19 pandemic this visit was conducted virtually. This visit type was conducted due to national recommendations for restrictions regarding the COVID-19 Pandemic (e.g. social distancing, sheltering in place) in an effort to limit this patient's exposure and mitigate transmission in our community. All issues noted in this document were discussed and addressed.  A physical exam was not performed with this format.  I connected with Sherry Glass on 07/16/20 at 12:36 pm by telephone and verified that I am speaking with the correct person using two identifiers. SMT. LODER is currently located at home and no one is currently with her during visit. The provider, Jannifer Rodney, [name] is located in their office at time of visit.  I discussed the limitations, risks, security and privacy concerns of performing an evaluation and management service by telephone and the availability of in person appointments. I also discussed with the patient that there may be a patient responsible charge related to this service. The patient expressed understanding and agreed to proceed.   History and Present Illness:  Cough This is a new problem. The current episode started 1 to 4 weeks ago. The problem has been gradually worsening. The problem occurs every few minutes. The cough is non-productive. Associated symptoms include chills, a fever (100), nasal congestion, shortness of breath and wheezing. Pertinent negatives include no ear congestion, ear pain, headaches or myalgias. She has tried rest and OTC cough suppressant for the symptoms. The treatment provided mild relief. Her past medical history is significant for COPD.      Review of Systems  Constitutional: Positive for chills and fever (100).  HENT: Negative for ear pain.   Respiratory: Positive for cough, shortness of breath and wheezing.   Musculoskeletal: Negative for myalgias.  Neurological: Negative for headaches.      Observations/Objective: No SOB or distress noted, hoarse voice   Assessment and Plan: 1. COPD exacerbation (HCC) Rest Force fluids Tylenol as needed Strict low car diet with steroids RTO if symptoms worsen or do not improve  - azithromycin (ZITHROMAX) 250 MG tablet; Take 500 mg once, then 250 mg for four days  Dispense: 6 tablet; Refill: 0 - predniSONE (STERAPRED UNI-PAK 21 TAB) 10 MG (21) TBPK tablet; Use as directed  Dispense: 21 tablet; Refill: 0 - albuterol (VENTOLIN HFA) 108 (90 Base) MCG/ACT inhaler; Inhale 2 puffs into the lungs every 6 (six) hours as needed for wheezing or shortness of breath.  Dispense: 8 g; Refill: 2 - albuterol (PROVENTIL) (2.5 MG/3ML) 0.083% nebulizer solution; Use 1 vial (3 mL) via nebulizer every 6 hours as needed for wheezing or shortness of breath  Dispense: 150 mL; Refill: 5      I discussed the assessment and treatment plan with the patient. The patient was provided an opportunity to ask questions and all were answered. The patient agreed with the plan and demonstrated an understanding of the instructions.   The patient was advised to call back or seek an in-person evaluation if the symptoms worsen or if the condition fails to improve as anticipated.  The above assessment and management plan was discussed with the patient. The patient verbalized understanding of and has agreed to the management plan. Patient is aware to call the clinic if symptoms persist or worsen. Patient is aware when to return to the clinic for a follow-up visit. Patient educated on when it is appropriate to go to the emergency department.   Time call ended:  12:47 pm  I provided 11 minutes of non-face-to-face time during this encounter.    Evelina Dun, [name]

## 2020-07-23 NOTE — Telephone Encounter (Signed)
Sherry Glass,  Does pt need to come in the office during Covid Clinic?

## 2020-07-24 ENCOUNTER — Telehealth: Payer: Self-pay

## 2020-07-24 DIAGNOSIS — R059 Cough, unspecified: Secondary | ICD-10-CM | POA: Diagnosis not present

## 2020-07-24 DIAGNOSIS — R0989 Other specified symptoms and signs involving the circulatory and respiratory systems: Secondary | ICD-10-CM | POA: Diagnosis not present

## 2020-07-24 DIAGNOSIS — M48061 Spinal stenosis, lumbar region without neurogenic claudication: Secondary | ICD-10-CM | POA: Insufficient documentation

## 2020-07-24 DIAGNOSIS — R0602 Shortness of breath: Secondary | ICD-10-CM | POA: Diagnosis not present

## 2020-07-24 NOTE — Telephone Encounter (Signed)
Per Mychart message contacted patient and advised that we would like for her to return to the clinic to be seen. She stated that she went to Urgent Care earlier today and didn't need the appt.

## 2020-07-29 ENCOUNTER — Other Ambulatory Visit: Payer: Self-pay | Admitting: Family

## 2020-07-29 DIAGNOSIS — E785 Hyperlipidemia, unspecified: Secondary | ICD-10-CM

## 2020-07-31 ENCOUNTER — Other Ambulatory Visit: Payer: Self-pay | Admitting: Family

## 2020-08-07 ENCOUNTER — Other Ambulatory Visit: Payer: Self-pay | Admitting: Family

## 2020-08-12 DIAGNOSIS — L400 Psoriasis vulgaris: Secondary | ICD-10-CM | POA: Diagnosis not present

## 2020-08-20 DIAGNOSIS — Z79899 Other long term (current) drug therapy: Secondary | ICD-10-CM | POA: Diagnosis not present

## 2020-08-20 DIAGNOSIS — I400 Infective myocarditis: Secondary | ICD-10-CM | POA: Diagnosis not present

## 2020-08-20 DIAGNOSIS — R531 Weakness: Secondary | ICD-10-CM | POA: Diagnosis not present

## 2020-08-21 DIAGNOSIS — M47816 Spondylosis without myelopathy or radiculopathy, lumbar region: Secondary | ICD-10-CM | POA: Diagnosis not present

## 2020-08-21 DIAGNOSIS — Z6835 Body mass index (BMI) 35.0-35.9, adult: Secondary | ICD-10-CM | POA: Diagnosis not present

## 2020-08-21 DIAGNOSIS — I10 Essential (primary) hypertension: Secondary | ICD-10-CM | POA: Diagnosis not present

## 2020-08-25 ENCOUNTER — Other Ambulatory Visit: Payer: Self-pay | Admitting: Family

## 2020-08-26 NOTE — Telephone Encounter (Signed)
[facility]. NTBS 30 days given 07/29/20

## 2020-08-27 ENCOUNTER — Telehealth: Payer: Self-pay | Admitting: Family Medicine

## 2020-09-16 DIAGNOSIS — M47816 Spondylosis without myelopathy or radiculopathy, lumbar region: Secondary | ICD-10-CM | POA: Diagnosis not present

## 2020-09-17 DIAGNOSIS — E890 Postprocedural hypothyroidism: Secondary | ICD-10-CM | POA: Diagnosis not present

## 2020-09-18 ENCOUNTER — Other Ambulatory Visit: Payer: Self-pay

## 2020-09-18 ENCOUNTER — Ambulatory Visit (INDEPENDENT_AMBULATORY_CARE_PROVIDER_SITE_OTHER): Payer: Medicaid Other | Admitting: Family

## 2020-09-18 ENCOUNTER — Encounter: Payer: Self-pay | Admitting: Family

## 2020-09-18 VITALS — BP 127/74 | HR 72 | Temp 97.6°F | Ht 64.0 in | Wt 204.4 lb

## 2020-09-18 DIAGNOSIS — I152 Hypertension secondary to endocrine disorders: Secondary | ICD-10-CM | POA: Diagnosis not present

## 2020-09-18 DIAGNOSIS — M5442 Lumbago with sciatica, left side: Secondary | ICD-10-CM

## 2020-09-18 DIAGNOSIS — E890 Postprocedural hypothyroidism: Secondary | ICD-10-CM

## 2020-09-18 DIAGNOSIS — F411 Generalized anxiety disorder: Secondary | ICD-10-CM

## 2020-09-18 DIAGNOSIS — E1159 Type 2 diabetes mellitus with other circulatory complications: Secondary | ICD-10-CM

## 2020-09-18 DIAGNOSIS — F320 Major depressive disorder, single episode, mild: Secondary | ICD-10-CM

## 2020-09-18 DIAGNOSIS — E1165 Type 2 diabetes mellitus with hyperglycemia: Secondary | ICD-10-CM

## 2020-09-18 DIAGNOSIS — K219 Gastro-esophageal reflux disease without esophagitis: Secondary | ICD-10-CM

## 2020-09-18 DIAGNOSIS — M5441 Lumbago with sciatica, right side: Secondary | ICD-10-CM

## 2020-09-18 DIAGNOSIS — M5440 Lumbago with sciatica, unspecified side: Secondary | ICD-10-CM | POA: Diagnosis not present

## 2020-09-18 DIAGNOSIS — K760 Fatty (change of) liver, not elsewhere classified: Secondary | ICD-10-CM | POA: Diagnosis not present

## 2020-09-18 DIAGNOSIS — J441 Chronic obstructive pulmonary disease with (acute) exacerbation: Secondary | ICD-10-CM

## 2020-09-18 DIAGNOSIS — G8929 Other chronic pain: Secondary | ICD-10-CM

## 2020-09-18 DIAGNOSIS — E1169 Type 2 diabetes mellitus with other specified complication: Secondary | ICD-10-CM

## 2020-09-18 DIAGNOSIS — E785 Hyperlipidemia, unspecified: Secondary | ICD-10-CM | POA: Diagnosis not present

## 2020-09-18 DIAGNOSIS — E559 Vitamin D deficiency, unspecified: Secondary | ICD-10-CM

## 2020-09-18 LAB — TSH: TSH: 0.439 u[IU]/mL — ABNORMAL LOW (ref 0.450–4.500)

## 2020-09-18 LAB — T4, FREE: Free T4: 1.7 ng/dL (ref 0.82–1.77)

## 2020-09-18 LAB — BAYER DCA HB A1C WAIVED: HB A1C (BAYER DCA - WAIVED): 7.5 % — ABNORMAL HIGH (ref <7.0)

## 2020-09-18 MED ORDER — ALBUTEROL SULFATE (2.5 MG/3ML) 0.083% IN NEBU: INHALATION_SOLUTION | RESPIRATORY_TRACT | 5 refills | Status: DC

## 2020-09-18 MED ORDER — CYCLOBENZAPRINE HCL 10 MG PO TABS: 1.0000 | ORAL_TABLET | Freq: Three times a day (TID) | ORAL | 1 refills | Status: DC | PRN

## 2020-09-18 MED ORDER — ROSUVASTATIN CALCIUM 10 MG PO TABS: 10.0000 mg | ORAL_TABLET | Freq: Every day | ORAL | 3 refills | Status: DC

## 2020-09-18 MED ORDER — ALBUTEROL SULFATE HFA 108 (90 BASE) MCG/ACT IN AERS: 2.0000 | INHALATION_SPRAY | Freq: Four times a day (QID) | RESPIRATORY_TRACT | 2 refills | Status: DC | PRN

## 2020-09-18 MED ORDER — BUDESONIDE 0.5 MG/2ML IN SUSP: 0.5000 mg | Freq: Two times a day (BID) | RESPIRATORY_TRACT | 3 refills | Status: DC

## 2020-09-18 MED ORDER — GABAPENTIN 100 MG PO CAPS: ORAL_CAPSULE | ORAL | 1 refills | Status: DC

## 2020-09-18 MED ORDER — LOSARTAN POTASSIUM 100 MG PO TABS: 100.0000 mg | ORAL_TABLET | Freq: Every day | ORAL | 1 refills | Status: DC

## 2020-09-18 MED ORDER — METFORMIN HCL 1000 MG PO TABS: 1000.0000 mg | ORAL_TABLET | Freq: Two times a day (BID) | ORAL | 1 refills | Status: DC

## 2020-09-18 MED ORDER — TRULICITY 1.5 MG/0.5ML ~~LOC~~ SOAJ: 1.5000 mg | SUBCUTANEOUS | 2 refills | Status: DC

## 2020-09-18 MED ORDER — GLIMEPIRIDE 2 MG PO TABS: 2.0000 mg | ORAL_TABLET | Freq: Every day | ORAL | 1 refills | Status: DC

## 2020-09-18 NOTE — Progress Notes (Signed)
Subjective:    Patient ID: Sherry Glass, female    DOB: 05-06-1970, 50 y.o.   MRN: [id number]  Chief Complaint  Patient presents with  . Medical Management of Chronic Issues  . Hypertension  . Diabetes   Pt presents to the office today for chronic follow up. Pt is followed by Pulmonologist68monthfor COPD, Restrictive lung disease, and SOB. Followed by Endocrinologists yearlyfor hypothyroidism. She had thyroid ablation.  She is followed by [facility] every 6 months for chronic back pain.  Hypertension This is a chronic problem. The current episode started more than 1 year ago. The problem has been resolved since onset. The problem is controlled. Associated symptoms include anxiety and shortness of breath. Pertinent negatives include no blurred vision, malaise/fatigue or peripheral edema. Risk factors for coronary artery disease include dyslipidemia, diabetes mellitus, obesity and smoking/tobacco exposure. The current treatment provides moderate improvement.  Diabetes She presents for her follow-up diabetic visit. She has type 2 diabetes mellitus. Hypoglycemia symptoms include nervousness/anxiousness. Associated symptoms include foot paresthesias. Pertinent negatives for diabetes include no blurred vision. Symptoms are stable. Diabetic complications include nephropathy and peripheral neuropathy. Pertinent negatives for diabetic complications include no heart disease. Risk factors for coronary artery disease include dyslipidemia, diabetes mellitus, hypertension, sedentary lifestyle and post-menopausal. She is following a generally unhealthy diet. Her overall blood glucose range is 180-200 mg/dl. An ACE inhibitor/angiotensin II receptor blocker is being taken. Eye exam is current.  Gastroesophageal Reflux She complains of belching and heartburn. This is a chronic problem. The current episode started more than 1 year ago. The problem occurs occasionally. The problem has been waxing and  waning. The symptoms are aggravated by certain foods and smoking. Risk factors include obesity. She has tried a PPI for the symptoms. The treatment provided moderate relief.  Hyperlipidemia This is a chronic problem. The current episode started more than 1 year ago. The problem is controlled. Recent lipid tests were reviewed and are normal. Associated symptoms include shortness of breath. Current antihyperlipidemic treatment includes statins. The current treatment provides moderate improvement of lipids.  Anxiety Presents for follow-up visit. Symptoms include depressed mood, excessive worry, irritability, nervous/anxious behavior, restlessness and shortness of breath. Symptoms occur most days. The severity of symptoms is moderate.    Depression        This is a chronic problem.  The current episode started more than 1 year ago.   The problem occurs intermittently.  Associated symptoms include irritable, restlessness and sad.  Associated symptoms include no helplessness and no hopelessness.  Past medical history includes anxiety.    COPD  Has SOB at times.   Review of Systems  Constitutional: Positive for irritability. Negative for malaise/fatigue.  Eyes: Negative for blurred vision.  Respiratory: Positive for shortness of breath.   Gastrointestinal: Positive for heartburn.  Psychiatric/Behavioral: Positive for depression. The patient is nervous/anxious.   All other systems reviewed and are negative.      Objective:   Physical Exam Vitals reviewed.  Constitutional:      General: She is irritable. She is not in acute distress.    Appearance: She is well-developed. She is obese.  HENT:     Head: Normocephalic and atraumatic.     Right Ear: Tympanic membrane normal.     Left Ear: Tympanic membrane normal.  Eyes:     Pupils: Pupils are equal, round, and reactive to light.  Neck:     Thyroid: No thyromegaly.  Cardiovascular:     Rate and Rhythm: Normal rate  and regular rhythm.      Heart sounds: Normal heart sounds. No murmur heard.   Pulmonary:     Effort: Pulmonary effort is normal. No respiratory distress.     Breath sounds: Normal breath sounds. No wheezing.  Abdominal:     General: Bowel sounds are normal. There is no distension.     Palpations: Abdomen is soft.     Tenderness: There is no abdominal tenderness.  Musculoskeletal:        General: No tenderness.     Cervical back: Normal range of motion and neck supple.     Comments: Pain in lumbar with flexion and extension  Skin:    General: Skin is warm and dry.  Neurological:     Mental Status: She is alert and oriented to person, place, and time.     Cranial Nerves: No cranial nerve deficit.     Deep Tendon Reflexes: Reflexes are normal and symmetric.  Psychiatric:        Behavior: Behavior normal.        Thought Content: Thought content normal.        Judgment: Judgment normal.       BP 127/74   Pulse 72   Temp 97.6 F (36.4 C) (Temporal)   Ht _0  (1.626 m)   Wt 204 lb 6.4 oz (92.7 kg)   SpO2 96%   BMI 35.09 kg/m      Assessment & Plan:  [name] comes in today with chief complaint of Medical Management of Chronic Issues, Hypertension, and Diabetes   Diagnosis and orders addressed:  1. Hypertension associated with diabetes ([place]) - CMP14+EGFR - CBC with Differential/Platelet - losartan (COZAAR) 100 MG tablet; Take 1 tablet (100 mg total) by mouth daily.  Dispense: 90 tablet; Refill: 1  2. Chronic obstructive pulmonary disease with acute exacerbation (HCC) - CMP14+EGFR - CBC with Differential/Platelet - budesonide (PULMICORT) 0.5 MG/2ML nebulizer solution; Take 2 mLs (0.5 mg total) by nebulization 2 (two) times daily.  Dispense: 120 mL; Refill: 3  3. Gastroesophageal reflux disease, unspecified whether esophagitis present - CMP14+EGFR - CBC with Differential/Platelet  4. Hepatic steatosis - CMP14+EGFR - CBC with Differential/Platelet  5. Uncontrolled type 2 diabetes  mellitus with hyperglycemia (HCC)  - CMP14+EGFR - CBC with Differential/Platelet - Bayer DCA Hb A1c Waived - Microalbumin / creatinine urine ratio - metFORMIN (GLUCOPHAGE) 1000 MG tablet; Take 1 tablet (1,000 mg total) by mouth 2 (two) times daily with a meal. (NEEDS TO BE SEEN BEFORE NEXT REFILL)  Dispense: 180 tablet; Refill: 1 - glimepiride (AMARYL) 2 MG tablet; Take 1 tablet (2 mg total) by mouth daily with breakfast. (NEEDS TO BE SEEN BEFORE NEXT REFILL)  Dispense: 90 tablet; Refill: 1 - Dulaglutide (TRULICITY) 1.5 PH/4.3EX SOPN; Inject 1.5 mg into the skin once a week.  Dispense: 12 mL; Refill: 2  6. Hypothyroidism following radioiodine therapy - CMP14+EGFR - CBC with Differential/Platelet  7. Hyperlipidemia associated with type 2 diabetes mellitus (HCC) - CMP14+EGFR - CBC with Differential/Platelet - Lipid panel - rosuvastatin (CRESTOR) 10 MG tablet; Take 1 tablet (10 mg total) by mouth daily.  Dispense: 90 tablet; Refill: 3  8. Morbid obesity ([place])  - CMP14+EGFR - CBC with Differential/Platelet  9. GAD (generalized anxiety disorder) - CMP14+EGFR - CBC with Differential/Platelet  10. Depression, major, single episode, mild (HCC)  - CMP14+EGFR - CBC with Differential/Platelet  11. Chronic bilateral low back pain with sciatica, sciatica laterality unspecified - cyclobenzaprine (FLEXERIL) 10 MG tablet; Take  1 tablet (10 mg total) by mouth 3 (three) times daily as needed. for muscle spams  Dispense: 90 tablet; Refill: 1  12. Chronic bilateral low back pain with bilateral sciatica  - gabapentin (NEURONTIN) 100 MG capsule; Take 300 mg in AM, 100 mg in afternoon, and 300 mg at night.  Dispense: 210 capsule; Refill: 1  13. COPD exacerbation (HCC) - albuterol (VENTOLIN HFA) 108 (90 Base) MCG/ACT inhaler; Inhale 2 puffs into the lungs every 6 (six) hours as needed for wheezing or shortness of breath.  Dispense: 8 g; Refill: 2 - albuterol (PROVENTIL) (2.5 MG/3ML) 0.083%  nebulizer solution; Use 1 vial (3 mL) via nebulizer every 6 hours as needed for wheezing or shortness of breath  Dispense: 150 mL; Refill: 5  14. Hyperlipidemia, unspecified hyperlipidemia type   15. Vitamin D deficiency    Labs pending Health Maintenance reviewed Diet and exercise encouraged  Follow up plan: 3 months    Evelina Dun, [name]

## 2020-09-18 NOTE — Patient Instructions (Signed)
Diabetes Mellitus and Nutrition, Adult When you have diabetes, or diabetes mellitus, it is very important to have healthy eating habits because your blood sugar (glucose) levels are greatly affected by what you eat and drink. Eating healthy foods in the right amounts, at about the same times every day, can help you:  Control your blood glucose.  Lower your risk of heart disease.  Improve your blood pressure.  Reach or maintain a healthy weight. What can affect my meal plan? Every person with diabetes is different, and each person has different needs for a meal plan. Your health care provider may recommend that you work with a dietitian to make a meal plan that is best for you. Your meal plan may vary depending on factors such as:  The calories you need.  The medicines you take.  Your weight.  Your blood glucose, blood pressure, and cholesterol levels.  Your activity level.  Other health conditions you have, such as heart or kidney disease. How do carbohydrates affect me? Carbohydrates, also called carbs, affect your blood glucose level more than any other type of food. Eating carbs naturally raises the amount of glucose in your blood. Carb counting is a method for keeping track of how many carbs you eat. Counting carbs is important to keep your blood glucose at a healthy level, especially if you use insulin or take certain oral diabetes medicines. It is important to know how many carbs you can safely have in each meal. This is different for every person. Your dietitian can help you calculate how many carbs you should have at each meal and for each snack. How does alcohol affect me? Alcohol can cause a sudden decrease in blood glucose (hypoglycemia), especially if you use insulin or take certain oral diabetes medicines. Hypoglycemia can be a life-threatening condition. Symptoms of hypoglycemia, such as sleepiness, dizziness, and confusion, are similar to symptoms of having too much  alcohol.  Do not drink alcohol if: ? Your health care provider tells you not to drink. ? You are pregnant, may be pregnant, or are planning to become pregnant.  If you drink alcohol: ? Do not drink on an empty stomach. ? Limit how much you use to:  0-1 drink a day for women.  0-2 drinks a day for men. ? Be aware of how much alcohol is in your drink. In the [place] one drink equals one 12 oz bottle of beer (355 mL), one 5 oz glass of wine (148 mL), or one 1 oz glass of hard liquor (44 mL). ? Keep yourself hydrated with water, diet soda, or unsweetened iced tea.  Keep in mind that regular soda, juice, and other mixers may contain a lot of sugar and must be counted as carbs. What are tips for following this plan? Reading food labels  Start by checking the serving size on the "Nutrition Facts" label of packaged foods and drinks. The amount of calories, carbs, fats, and other nutrients listed on the label is based on one serving of the item. Many items contain more than one serving per package.  Check the total grams (g) of carbs in one serving. You can calculate the number of servings of carbs in one serving by dividing the total carbs by 15. For example, if a food has 30 g of total carbs per serving, it would be equal to 2 servings of carbs.  Check the number of grams (g) of saturated fats and trans fats in one serving. Choose foods that have   a low amount or none of these fats.  Check the number of milligrams (mg) of salt (sodium) in one serving. Most people should limit total sodium intake to less than 2,300 mg per day.  Always check the nutrition information of foods labeled as "low-fat" or "nonfat." These foods may be higher in added sugar or refined carbs and should be avoided.  Talk to your dietitian to identify your daily goals for nutrients listed on the label. Shopping  Avoid buying canned, pre-made, or processed foods. These foods tend to be high in fat, sodium, and added  sugar.  Shop around the outside edge of the grocery store. This is where you will most often find fresh fruits and vegetables, bulk grains, fresh meats, and fresh dairy. Cooking  Use low-heat cooking methods, such as baking, instead of high-heat cooking methods like deep frying.  Cook using healthy oils, such as olive, canola, or sunflower oil.  Avoid cooking with butter, cream, or high-fat meats. Meal planning  Eat meals and snacks regularly, preferably at the same times every day. Avoid going long periods of time without eating.  Eat foods that are high in fiber, such as fresh fruits, vegetables, beans, and whole grains. Talk with your dietitian about how many servings of carbs you can eat at each meal.  Eat 4-6 oz (112-168 g) of lean protein each day, such as lean meat, chicken, fish, eggs, or tofu. One ounce (oz) of lean protein is equal to: ? 1 oz (28 g) of meat, chicken, or fish. ? 1 egg. ?  cup (62 g) of tofu.  Eat some foods each day that contain healthy fats, such as avocado, nuts, seeds, and fish.   What foods should I eat? Fruits Berries. Apples. Oranges. Peaches. Apricots. Plums. Grapes. Mango. Papaya. Pomegranate. Kiwi. Cherries. Vegetables Lettuce. Spinach. Leafy greens, including kale, chard, collard greens, and mustard greens. Beets. Cauliflower. Cabbage. Broccoli. Carrots. Green beans. Tomatoes. Peppers. Onions. Cucumbers. Brussels sprouts. Grains Whole grains, such as whole-wheat or whole-grain bread, crackers, tortillas, cereal, and pasta. Unsweetened oatmeal. Quinoa. Brown or wild rice. Meats and other proteins Seafood. Poultry without skin. Lean cuts of poultry and beef. Tofu. Nuts. Seeds. Dairy Low-fat or fat-free dairy products such as milk, yogurt, and cheese. The items listed above may not be a complete list of foods and beverages you can eat. Contact a dietitian for more information. What foods should I avoid? Fruits Fruits canned with  syrup. Vegetables Canned vegetables. Frozen vegetables with butter or cream sauce. Grains Refined white flour and flour products such as bread, pasta, snack foods, and cereals. Avoid all processed foods. Meats and other proteins Fatty cuts of meat. Poultry with skin. Breaded or fried meats. Processed meat. Avoid saturated fats. Dairy Full-fat yogurt, cheese, or milk. Beverages Sweetened drinks, such as soda or iced tea. The items listed above may not be a complete list of foods and beverages you should avoid. Contact a dietitian for more information. Questions to ask a health care provider  Do I need to meet with a diabetes educator?  Do I need to meet with a dietitian?  What number can I call if I have questions?  When are the best times to check my blood glucose? Where to find more information:  American Diabetes Association: diabetes.org  Academy of Nutrition and Dietetics: www.eatright.org  National Institute of Diabetes and Digestive and Kidney Diseases: www.niddk.nih.gov  Association of Diabetes Care and Education Specialists: www.diabeteseducator.org Summary  It is important to have healthy eating   habits because your blood sugar (glucose) levels are greatly affected by what you eat and drink.  A healthy meal plan will help you control your blood glucose and maintain a healthy lifestyle.  Your health care provider may recommend that you work with a dietitian to make a meal plan that is best for you.  Keep in mind that carbohydrates (carbs) and alcohol have immediate effects on your blood glucose levels. It is important to count carbs and to use alcohol carefully. This information is not intended to replace advice given to you by your health care provider. Make sure you discuss any questions you have with your health care provider. Document Revised: 03/26/2019 Document Reviewed: 03/26/2019 Elsevier Patient Education  2021 [employer]  

## 2020-09-19 ENCOUNTER — Other Ambulatory Visit: Payer: Self-pay | Admitting: Family

## 2020-09-19 LAB — CMP14+EGFR
ALT: 30 IU/L (ref 0–32)
AST: 21 IU/L (ref 0–40)
Albumin/Globulin Ratio: 1.7 (ref 1.2–2.2)
Albumin: 4.5 g/dL (ref 3.8–4.9)
Alkaline Phosphatase: 86 IU/L (ref 44–121)
BUN/Creatinine Ratio: 11 (ref 9–23)
BUN: 7 mg/dL (ref 6–24)
Bilirubin Total: 0.5 mg/dL (ref 0.0–1.2)
CO2: 16 mmol/L — ABNORMAL LOW (ref 20–29)
Calcium: 9.4 mg/dL (ref 8.7–10.2)
Chloride: 104 mmol/L (ref 96–106)
Creatinine, Ser: 0.63 mg/dL (ref 0.57–1.00)
Globulin, Total: 2.6 g/dL (ref 1.5–4.5)
Glucose: 125 mg/dL — ABNORMAL HIGH (ref 65–99)
Potassium: 4.5 mmol/L (ref 3.5–5.2)
Sodium: 140 mmol/L (ref 134–144)
Total Protein: 7.1 g/dL (ref 6.0–8.5)
eGFR: 107 mL/min/{1.73_m2} (ref >59)

## 2020-09-19 LAB — CBC WITH DIFFERENTIAL/PLATELET
Basophils Absolute: 0 10*3/uL (ref 0.0–0.2)
Basos: 0 %
EOS (ABSOLUTE): 0.2 10*3/uL (ref 0.0–0.4)
Eos: 2 %
Hematocrit: 41.8 % (ref 34.0–46.6)
Hemoglobin: 14 g/dL (ref 11.1–15.9)
Immature Grans (Abs): 0 10*3/uL (ref 0.0–0.1)
Immature Granulocytes: 0 %
Lymphocytes Absolute: 4.6 10*3/uL — ABNORMAL HIGH (ref 0.7–3.1)
Lymphs: 45 %
MCH: 32.5 pg (ref 26.6–33.0)
MCHC: 33.5 g/dL (ref 31.5–35.7)
MCV: 97 fL (ref 79–97)
Monocytes Absolute: 0.7 10*3/uL (ref 0.1–0.9)
Monocytes: 6 %
Neutrophils Absolute: 4.8 10*3/uL (ref 1.4–7.0)
Neutrophils: 47 %
Platelets: 294 10*3/uL (ref 150–450)
RBC: 4.31 x10E6/uL (ref 3.77–5.28)
RDW: 12.7 % (ref 11.7–15.4)
WBC: 10.4 10*3/uL (ref 3.4–10.8)

## 2020-09-19 LAB — LIPID PANEL
Chol/HDL Ratio: 3.2 ratio (ref 0.0–4.4)
Cholesterol, Total: 135 mg/dL (ref 100–199)
HDL: 42 mg/dL (ref >39)
LDL Chol Calc (NIH): 51 mg/dL (ref 0–99)
Triglycerides: 272 mg/dL — ABNORMAL HIGH (ref 0–149)
VLDL Cholesterol Cal: 42 mg/dL — ABNORMAL HIGH (ref 5–40)

## 2020-09-19 LAB — MICROALBUMIN / CREATININE URINE RATIO
Creatinine, Urine: 166.5 mg/dL
Microalb/Creat Ratio: 10 mg/g creat (ref 0–29)
Microalbumin, Urine: 16.4 ug/mL

## 2020-09-22 ENCOUNTER — Other Ambulatory Visit: Payer: Self-pay

## 2020-09-22 ENCOUNTER — Encounter: Payer: Self-pay | Admitting: "Endocrinology

## 2020-09-22 ENCOUNTER — Ambulatory Visit (INDEPENDENT_AMBULATORY_CARE_PROVIDER_SITE_OTHER): Payer: Medicaid Other | Admitting: "Endocrinology

## 2020-09-22 VITALS — BP 114/72 | HR 68 | Ht 64.0 in | Wt 204.2 lb

## 2020-09-22 DIAGNOSIS — E890 Postprocedural hypothyroidism: Secondary | ICD-10-CM

## 2020-09-22 MED ORDER — LEVOTHYROXINE SODIUM 100 MCG PO TABS: 100.0000 ug | ORAL_TABLET | Freq: Every day | ORAL | 3 refills | Status: DC

## 2020-09-22 NOTE — Progress Notes (Signed)
09/22/2020     Endocrinology follow-up note   Subjective:    Patient ID: Sherry Glass, female    DOB: 1971/02/15, PCP Sharion Balloon, [name].   Past Medical History:  Diagnosis Date  . Allergic rhinitis   . Anxiety and depression   . Cataract   . Chest pain syndrome   . COPD (chronic obstructive pulmonary disease) ([place])   . Depression   . Diabetes mellitus without complication ([place])   . History of kidney stones   . HTN (hypertension)   . Hypercholesterolemia   . Hyperglycemia   . Hypertension   . Hypothyroidism   . Obesity   . Restless leg syndrome   . Tobacco abuse    Past Surgical History:  Procedure Laterality Date  . ABDOMINAL HYSTERECTOMY    . CARDIAC CATHETERIZATION  2014  . CATARACT EXTRACTION W/PHACO Left 06/23/2016   Procedure: CATARACT EXTRACTION PHACO AND INTRAOCULAR LENS PLACEMENT (IOC);  Surgeon: Tonny Branch, MD;  Location: [facility];  Service: Ophthalmology;  Laterality: Left;  left - pt knows to arrive at 10:50 cde 9.11  . CESAREAN SECTION    . CHOLECYSTECTOMY    . COLONOSCOPY WITH PROPOFOL N/A 10/14/2019   Procedure: COLONOSCOPY WITH PROPOFOL;  Surgeon: Daneil Dolin, MD;  Location: [facility];  Service: Endoscopy;  Laterality: N/A;  8:00  . EXCISION MASS NECK     cyst  . FOOT MASS EXCISION    . left foot surgery     removal of cyst  . NECK SURGERY    . POLYPECTOMY  10/14/2019   Procedure: POLYPECTOMY;  Surgeon: Daneil Dolin, MD;  Location: [facility];  Service: Endoscopy;;  . THYROID SURGERY     Dr. Arnoldo Morale  . TONSILLECTOMY     Social History   Socioeconomic History  . Marital status: Widowed    Spouse name: Not on file  . Number of children: Not on file  . Years of education: Not on file  . Highest education level: Not on file  Occupational History  . Not on file  Tobacco Use  . Smoking status: Former Smoker    Packs/day: 1.00    Years: 25.00    Pack years: 25.00    Types: Cigarettes    Start date: 11/13/1988    Quit date:  10/28/2016    Years since quitting: 3.9  . Smokeless tobacco: Never Used  Vaping Use  . Vaping Use: Never used  Substance and Sexual Activity  . Alcohol use: Yes    Comment: For special occasions, very rare   . Drug use: No  . Sexual activity: Never    Birth control/protection: Surgical  Other Topics Concern  . Not on file  Social History Narrative   [facility] (11/18/16):   Originally from [place] but grew up in [place]. Cared for her husband until he passed. 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 Does have significant dust exposure. Has 3 dogs currently. Previously had a parakeet as a child. No mold exposure. Enjoys playing with her grandchildren.        Social Determinants of Health   Financial Resource Strain: Not on file  Food Insecurity: Not on file  Transportation Needs: Not on file  Physical Activity: Not on file  Stress: Not on file  Social Connections: Not on file   Outpatient Encounter Medications as of 09/22/2020  Medication Sig  . albuterol (PROVENTIL) (2.5 MG/3ML) 0.083% nebulizer solution Use 1  vial (3 mL) via nebulizer every 6 hours as needed for wheezing or shortness of breath  . albuterol (VENTOLIN HFA) 108 (90 Base) MCG/ACT inhaler Inhale 2 puffs into the lungs every 6 (six) hours as needed for wheezing or shortness of breath.  Marland Kitchen aspirin EC 81 MG tablet Take one po qd (Patient taking differently: Take 81 mg by mouth daily.)  . budesonide (PULMICORT) 0.5 MG/2ML nebulizer solution Take 2 mLs (0.5 mg total) by nebulization 2 (two) times daily.  . cyclobenzaprine (FLEXERIL) 10 MG tablet Take 1 tablet (10 mg total) by mouth 3 (three) times daily as needed. for muscle spams  . Dulaglutide (TRULICITY) 1.5 LP/3.7TK SOPN Inject 1.5 mg into the skin once a week.  . gabapentin (NEURONTIN) 100 MG capsule Take 300 mg in AM, 100 mg in afternoon, and 300 mg at night.  Marland Kitchen glimepiride (AMARYL) 2 MG tablet Take 1 tablet (2 mg total) by  mouth daily with breakfast. (NEEDS TO BE SEEN BEFORE NEXT REFILL)  . Glycopyrrolate-Formoterol (BEVESPI AEROSPHERE) 9-4.8 MCG/ACT AERO Inhale 2 puffs into the lungs 2 (two) times daily.  Marland Kitchen levothyroxine (SYNTHROID) 100 MCG tablet Take 1 tablet (100 mcg total) by mouth daily before breakfast.  . losartan (COZAAR) 100 MG tablet Take 1 tablet (100 mg total) by mouth daily.  . metFORMIN (GLUCOPHAGE) 1000 MG tablet Take 1 tablet (1,000 mg total) by mouth 2 (two) times daily with a meal. (NEEDS TO BE SEEN BEFORE NEXT REFILL)  . rosuvastatin (CRESTOR) 10 MG tablet Take 1 tablet (10 mg total) by mouth daily.  . [DISCONTINUED] levothyroxine (SYNTHROID) 100 MCG tablet Take 1 tablet (100 mcg total) by mouth daily before breakfast.   No facility-administered encounter medications on file as of 09/22/2020.   ALLERGIES: Allergies  Allergen Reactions  . Dilaudid [Hydromorphone]     Itching and vomitting  . Hydromorphone   . Valproic Acid And Related Nausea And Vomiting   VACCINATION STATUS: Immunization History  Administered Date(s) Administered  . Influenza,inj,Quad PF,6+ Mos 04/27/2017  . Pneumococcal Polysaccharide-23 05/02/2005    HPI  Mrs. Sherry Glass is status post radioactive iodine thyroid ablation on June 29, 2018.  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   She received RAI thyroid ablation to treat primary hyperthyroidism on June 29, 2018.  She was subsequently initiated on  Levothyroxine.  She is currently on levothyroxine 100 mcg p.o. daily before breakfast.  She reports compliance to his medication.  She has no new complaints today.   She returns with continued progressive weight loss.    Her previsit labs are consistent with appropriate replacement.  The patient denies family history of thyroid dysfunction- she is adopted. -She denies palpitations, tremors, nor heat intolerance. - She has well-controlled type 2 diabetes with A1c of 7.5%   following with her PMD, taking Metformin, glimepiride, and Jardiance.    - She reports that she has quit smoking after decades of heavy smoking.     Review of Systems. Limited as above   Objective:    BP 114/72   Pulse 68   Ht 5' 4" (1.626 m)   Wt 204 lb 3.2 oz (92.6 kg)   BMI 35.05 kg/m   Wt Readings from Last 3 Encounters:  09/22/20 204 lb 3.2 oz (92.6 kg)  09/18/20 204 lb 6.4 oz (92.7 kg)  12/19/19 217 lb (98.4 kg)    Physical Exam   Lipid Panel     Component Value Date/Time   CHOL 135 09/18/2020  1038   TRIG 272 (H) 09/18/2020 1038   HDL 42 09/18/2020 1038   CHOLHDL 3.2 09/18/2020 1038   LDLCALC 51 09/18/2020 1038   Recent Results (from the past 2160 hour(s))  TSH     Status: Abnormal   Collection Time: 09/17/20  9:34 AM  Result Value Ref Range   TSH 0.439 (L) 0.450 - 4.500 uIU/mL  T4, free     Status: None   Collection Time: 09/17/20  9:34 AM  Result Value Ref Range   Free T4 1.70 0.82 - 1.77 ng/dL  Bayer DCA Hb A1c Waived     Status: Abnormal   Collection Time: 09/18/20 10:27 AM  Result Value Ref Range   HB A1C (BAYER DCA - WAIVED) 7.5 (H) <7.0 %    Comment:                                       Diabetic Adult            <7.0                                       Healthy Adult        4.3 - 5.7                                                           (DCCT/NGSP) American Diabetes Association's Summary of Glycemic Recommendations for Adults with Diabetes: Hemoglobin A1c <7.0%. More stringent glycemic goals (A1c <6.0%) may further reduce complications at the cost of increased risk of hypoglycemia.   Microalbumin / creatinine urine ratio     Status: None   Collection Time: 09/18/20 10:32 AM  Result Value Ref Range   Creatinine, Urine 166.5 Not Estab. mg/dL   Microalbumin, Urine 16.4 Not Estab. ug/mL   Microalb/Creat Ratio 10 0 - 29 mg/g creat    Comment:                        Normal:                0 -  29                        Moderately increased:  30 - 300                        Severely increased:       >300   CMP14+EGFR     Status: Abnormal   Collection Time: 09/18/20 10:38 AM  Result Value Ref Range   Glucose 125 (H) 65 - 99 mg/dL   BUN 7 6 - 24 mg/dL   Creatinine, Ser 0.63 0.57 - 1.00 mg/dL   eGFR 107 >59 mL/min/1.73   BUN/Creatinine Ratio 11 9 - 23   Sodium 140 134 - 144 mmol/L   Potassium 4.5 3.5 - 5.2 mmol/L   Chloride 104 96 - 106 mmol/L   CO2 16 (L) 20 - 29 mmol/L   Calcium 9.4 8.7 - 10.2 mg/dL   Total Protein 7.1 6.0 - 8.5 g/dL  Albumin 4.5 3.8 - 4.9 g/dL   Globulin, Total 2.6 1.5 - 4.5 g/dL   Albumin/Globulin Ratio 1.7 1.2 - 2.2   Bilirubin Total 0.5 0.0 - 1.2 mg/dL   Alkaline Phosphatase 86 44 - 121 IU/L   AST 21 0 - 40 IU/L   ALT 30 0 - 32 IU/L  CBC with Differential/Platelet     Status: Abnormal   Collection Time: 09/18/20 10:38 AM  Result Value Ref Range   WBC 10.4 3.4 - 10.8 x10E3/uL   RBC 4.31 3.77 - 5.28 x10E6/uL   Hemoglobin 14.0 11.1 - 15.9 g/dL   Hematocrit 41.8 34.0 - 46.6 %   MCV 97 79 - 97 fL   MCH 32.5 26.6 - 33.0 pg   MCHC 33.5 31.5 - 35.7 g/dL   RDW 12.7 11.7 - 15.4 %   Platelets 294 150 - 450 x10E3/uL   Neutrophils 47 Not Estab. %   Lymphs 45 Not Estab. %   Monocytes 6 Not Estab. %   Eos 2 Not Estab. %   Basos 0 Not Estab. %   Neutrophils Absolute 4.8 1.4 - 7.0 x10E3/uL   Lymphocytes Absolute 4.6 (H) 0.7 - 3.1 x10E3/uL   Monocytes Absolute 0.7 0.1 - 0.9 x10E3/uL   EOS (ABSOLUTE) 0.2 0.0 - 0.4 x10E3/uL   Basophils Absolute 0.0 0.0 - 0.2 x10E3/uL   Immature Granulocytes 0 Not Estab. %   Immature Grans (Abs) 0.0 0.0 - 0.1 x10E3/uL  Lipid panel     Status: Abnormal   Collection Time: 09/18/20 10:38 AM  Result Value Ref Range   Cholesterol, Total 135 100 - 199 mg/dL   Triglycerides 272 (H) 0 - 149 mg/dL   HDL 42 >39 mg/dL   VLDL Cholesterol Cal 42 (H) 5 - 40 mg/dL   LDL Chol Calc (NIH) 51 0 - 99 mg/dL   Chol/HDL Ratio 3.2 0.0 - 4.4 ratio    Comment:                                    T. Chol/HDL Ratio                                             Men  Women                               1/2 Avg.Risk  3.4    3.3                                   Avg.Risk  5.0    4.4                                2X Avg.Risk  9.6    7.1                                3X Avg.Risk 23.4   11.0      - Thyroid uptake and scan on 07/19/2016 was nonfocal with 25% uptake and 24 hours.  March 05, 2018 thyroid uptake and scan: FINDINGS: Normal  thyroid scan.  No hot or cold nodules are identified.  4 hour I-131 uptake = 17.6% (normal 5-20%) 24 hour I-131 uptake = 30.0% (normal 10-30%)  IMPRESSION: 1. Normal thyroid scan. 2. Top-normal 24 hour iodine 131 uptake at 30%.  I-131 thyroid ablation on June 29, 2018.    Assessment & Plan:   1.  RAI induced hypothyroidism  2.  Type 2 diabetes -She is status post thyroid ablation with I-131 for significant T3 toxicosis.  Treatment was administered on June 29, 2018.   -Her previsit thyroid function tests are consistent with appropriate replacement.  She is advised to continue levothyroxine 100 mcg p.o. daily before breakfast.    - We discussed about the correct intake of her thyroid hormone, on empty stomach at fasting, with water, separated by at least 30 minutes from breakfast and other medications,  and separated by more than 4 hours from calcium, iron, multivitamins, acid reflux medications (PPIs). -Patient is made aware of the fact that thyroid hormone replacement is needed for life, dose to be adjusted by periodic monitoring of thyroid function tests.  She has type 2 diabetes with recent A1c of 7.5%.  She is advised to continue metformin 1000 mg p.o. twice daily and glimepiride 2 mg p.o. daily at breakfast.  She may benefit from glipizide 5 mg XL p.o. daily at breakfast instead of glimepiride.  She wishes to continue follow-up with her PCP for diabetes care.  - I advised patient to maintain close follow up with Sharion Balloon, [name] for primary care needs, including diabetes care.   I spent 25 minutes in the care of the patient today including review of labs from Thyroid Function, CMP, and other relevant labs ; imaging/biopsy records (current and previous including abstractions from other facilities); face-to-face time discussing  her lab results and symptoms, medications doses, her options of short and long term treatment based on the latest standards of care / guidelines;   and documenting the encounter.  Seward Meth  participated in the discussions, expressed understanding, and voiced agreement with the above plans.  All questions were answered to her satisfaction. she is encouraged to contact clinic should she have any questions or concerns prior to her return visit.   Follow up plan: Return in about 1 year (around 09/22/2021) for F/U with Pre-visit Labs.  Glade Lloyd, MD Phone: [phone]  Fax: [phone]   09/22/2020, 4:23 PM

## 2020-09-23 LAB — HM DIABETES EYE EXAM

## 2020-10-21 ENCOUNTER — Other Ambulatory Visit: Payer: Self-pay | Admitting: Family

## 2020-10-21 DIAGNOSIS — Z1231 Encounter for screening mammogram for malignant neoplasm of breast: Secondary | ICD-10-CM

## 2020-11-10 DIAGNOSIS — M48062 Spinal stenosis, lumbar region with neurogenic claudication: Secondary | ICD-10-CM | POA: Diagnosis not present

## 2020-11-10 DIAGNOSIS — M47816 Spondylosis without myelopathy or radiculopathy, lumbar region: Secondary | ICD-10-CM | POA: Diagnosis not present

## 2020-11-10 DIAGNOSIS — M5416 Radiculopathy, lumbar region: Secondary | ICD-10-CM | POA: Diagnosis not present

## 2020-12-03 ENCOUNTER — Other Ambulatory Visit: Payer: Self-pay

## 2020-12-03 ENCOUNTER — Encounter: Payer: Self-pay | Admitting: Family

## 2020-12-03 ENCOUNTER — Ambulatory Visit: Payer: Medicaid Other | Admitting: Family

## 2020-12-03 VITALS — BP 133/80 | HR 76 | Temp 97.1°F | Ht 64.0 in | Wt 205.6 lb

## 2020-12-03 DIAGNOSIS — M5442 Lumbago with sciatica, left side: Secondary | ICD-10-CM | POA: Diagnosis not present

## 2020-12-03 DIAGNOSIS — E1169 Type 2 diabetes mellitus with other specified complication: Secondary | ICD-10-CM

## 2020-12-03 DIAGNOSIS — F411 Generalized anxiety disorder: Secondary | ICD-10-CM | POA: Diagnosis not present

## 2020-12-03 DIAGNOSIS — E1165 Type 2 diabetes mellitus with hyperglycemia: Secondary | ICD-10-CM

## 2020-12-03 DIAGNOSIS — M5440 Lumbago with sciatica, unspecified side: Secondary | ICD-10-CM

## 2020-12-03 DIAGNOSIS — J441 Chronic obstructive pulmonary disease with (acute) exacerbation: Secondary | ICD-10-CM

## 2020-12-03 DIAGNOSIS — F320 Major depressive disorder, single episode, mild: Secondary | ICD-10-CM | POA: Diagnosis not present

## 2020-12-03 DIAGNOSIS — G8929 Other chronic pain: Secondary | ICD-10-CM

## 2020-12-03 DIAGNOSIS — E785 Hyperlipidemia, unspecified: Secondary | ICD-10-CM

## 2020-12-03 DIAGNOSIS — I152 Hypertension secondary to endocrine disorders: Secondary | ICD-10-CM | POA: Diagnosis not present

## 2020-12-03 DIAGNOSIS — E890 Postprocedural hypothyroidism: Secondary | ICD-10-CM

## 2020-12-03 DIAGNOSIS — M5441 Lumbago with sciatica, right side: Secondary | ICD-10-CM | POA: Diagnosis not present

## 2020-12-03 DIAGNOSIS — E1159 Type 2 diabetes mellitus with other circulatory complications: Secondary | ICD-10-CM

## 2020-12-03 DIAGNOSIS — M778 Other enthesopathies, not elsewhere classified: Secondary | ICD-10-CM | POA: Diagnosis not present

## 2020-12-03 DIAGNOSIS — K219 Gastro-esophageal reflux disease without esophagitis: Secondary | ICD-10-CM | POA: Diagnosis not present

## 2020-12-03 LAB — BAYER DCA HB A1C WAIVED: HB A1C (BAYER DCA - WAIVED): 6.4 % (ref <7.0)

## 2020-12-03 MED ORDER — ALBUTEROL SULFATE HFA 108 (90 BASE) MCG/ACT IN AERS: 2.0000 | INHALATION_SPRAY | Freq: Four times a day (QID) | RESPIRATORY_TRACT | 2 refills | Status: AC | PRN

## 2020-12-03 MED ORDER — METFORMIN HCL 1000 MG PO TABS: 1000.0000 mg | ORAL_TABLET | Freq: Two times a day (BID) | ORAL | 1 refills | Status: DC

## 2020-12-03 MED ORDER — GABAPENTIN 100 MG PO CAPS: ORAL_CAPSULE | ORAL | 1 refills | Status: DC

## 2020-12-03 MED ORDER — PREDNISONE 10 MG (21) PO TBPK: ORAL_TABLET | ORAL | 0 refills | Status: DC

## 2020-12-03 MED ORDER — ROSUVASTATIN CALCIUM 10 MG PO TABS: 10.0000 mg | ORAL_TABLET | Freq: Every day | ORAL | 3 refills | Status: DC

## 2020-12-03 MED ORDER — CYCLOBENZAPRINE HCL 10 MG PO TABS: 10.0000 mg | ORAL_TABLET | Freq: Three times a day (TID) | ORAL | 1 refills | Status: DC | PRN

## 2020-12-03 MED ORDER — TRULICITY 1.5 MG/0.5ML ~~LOC~~ SOAJ: 1.5000 mg | SUBCUTANEOUS | 2 refills | Status: DC

## 2020-12-03 MED ORDER — BEVESPI AEROSPHERE 9-4.8 MCG/ACT IN AERO: 2.0000 | INHALATION_SPRAY | Freq: Two times a day (BID) | RESPIRATORY_TRACT | 0 refills | Status: DC

## 2020-12-03 MED ORDER — ESCITALOPRAM OXALATE 20 MG PO TABS: 20.0000 mg | ORAL_TABLET | Freq: Every day | ORAL | 5 refills | Status: DC

## 2020-12-03 MED ORDER — ESCITALOPRAM OXALATE 10 MG PO TABS: ORAL_TABLET | ORAL | 3 refills | Status: DC

## 2020-12-03 MED ORDER — LOSARTAN POTASSIUM 100 MG PO TABS: 100.0000 mg | ORAL_TABLET | Freq: Every day | ORAL | 1 refills | Status: DC

## 2020-12-03 MED ORDER — DICLOFENAC SODIUM 75 MG PO TBEC: 75.0000 mg | DELAYED_RELEASE_TABLET | Freq: Two times a day (BID) | ORAL | 0 refills | Status: DC

## 2020-12-03 MED ORDER — BUDESONIDE 0.5 MG/2ML IN SUSP: 0.5000 mg | Freq: Two times a day (BID) | RESPIRATORY_TRACT | 3 refills | Status: DC

## 2020-12-03 MED ORDER — GLIMEPIRIDE 2 MG PO TABS: 2.0000 mg | ORAL_TABLET | Freq: Every day | ORAL | 1 refills | Status: DC

## 2020-12-03 MED ORDER — ALBUTEROL SULFATE (2.5 MG/3ML) 0.083% IN NEBU: INHALATION_SOLUTION | RESPIRATORY_TRACT | 5 refills | Status: DC

## 2020-12-03 NOTE — Patient Instructions (Signed)
Major Depressive Disorder, Adult Major depressive disorder (MDD) is a mental health condition. It may also be called clinical depression or unipolar depression. MDD causes symptoms of sadness, hopelessness, and loss of interest in things. These symptoms last most of the day, almost every day, for 2 weeks. MDD can also cause physical symptoms. It can interfere with relationships and with everyday activities,such as work, school, and activities that are usually pleasant. MDD may be mild, moderate, or severe. It may be single-episode MDD, whichhappens once, or recurrent MDD, which may occur multiple times. What are the causes? The exact cause of this condition is not known. MDD is most likely caused by a combination of things, which may include: Your personality traits. Learned or conditioned behaviors or thoughts or feelings that reinforce negativity. Any alcohol or substance misuse. Long-term (chronic) physical or mental health illness. Going through a traumatic experience or major life changes. What increases the risk? The following factors may make someone more likely to develop MDD: A family history of depression. Being a woman. Troubled family relationships. Abnormally low levels of certain brain chemicals. Traumatic or painful events in childhood, especially abuse or loss of a parent. A lot of stress from life experiences, such as poor living conditions or discrimination. Chronic physical illness or other mental health disorders. What are the signs or symptoms? The main symptoms of MDD usually include: Constant depressed or irritable mood. A loss of interest in things and activities. Other symptoms include: Sleeping or eating too much or too little. Unexplained weight gain or weight loss. Tiredness or low energy. Being agitated, restless, or weak. Feeling hopeless, worthless, or guilty. Trouble thinking clearly or making decisions. Thoughts of suicide or thoughts of harming  others. Isolating oneself or avoiding other people or activities. Trouble completing tasks, work, or any normal obligations. Severe symptoms of this condition may include: Psychotic depression.This may include false beliefs, or delusions. It may also include seeing, hearing, tasting, smelling, or feeling things that are not real (hallucinations). Chronic depression or persistent depressive disorder. This is low-level depression that lasts for at least 2 years. Melancholic depression, or feeling extremely sad and hopeless. Catatonic depression, which includes trouble speaking and trouble moving. How is this diagnosed? This condition may be diagnosed based on: Your symptoms. Your medical and mental health history. You may be asked questions about your lifestyle, including any drug and alcohol use. A physical exam. Blood tests to rule out other conditions. MDD is confirmed if you have the following symptoms most of the day, nearly every day, in a 2-week period: Either a depressed mood or loss of interest. At least four other MDD symptoms. How is this treated? This condition is usually treated by mental health professionals, such as psychologists, psychiatrists, and clinical social workers. You may need more than one type of treatment. Treatment may include: Psychotherapy, also called talk therapy or counseling. Types of psychotherapy include: Cognitive behavioral therapy (CBT). This teaches you to recognize unhealthy feelings, thoughts, and behaviors, and replace them with positive thoughts and actions. Interpersonal therapy (IPT). This helps you to improve the way you communicate with others or relate to them. Family therapy. This treatment includes members of your family. Medicines to treat anxiety and depression. These medicines help to balance the brain chemicals that affect your emotions. Lifestyle changes. You may be asked to: Limit alcohol use and avoid drug use. Get regular  exercise. Get plenty of sleep. Make healthy eating choices. Spend more time outdoors. Brain stimulation. This may be done   if symptoms are very severe and other treatments have not worked. Examples of this treatment are electroconvulsive therapy and transcranial magnetic stimulation. Follow these instructions at home: Activity Exercise regularly and spend time outdoors. Find activities that you enjoy doing, and make time to do them. Find healthy ways to manage stress, such as: Meditation or deep breathing. Spending time in nature. Journaling. Return to your normal activities as told by your health care provider. Ask your health care provider what activities are safe for you. Alcohol and drug use If you drink alcohol: Limit how much you use to: 0-1 drink a day for women who are not pregnant. 0-2 drinks a day for men. Be aware of how much alcohol is in your drink. In the [place] one drink equals one 12 oz bottle of beer (355 mL), one 5 oz glass of wine (148 mL), or one 1 oz glass of hard liquor (44 mL). Discuss your alcohol use with your health care provider. Alcohol can affect any antidepressant medicines you are taking. Discuss any drug use with your health care provider. General instructions  Take over-the-counter and prescription medicines only as told by your health care provider. Eat a healthy diet and get plenty of sleep. Consider joining a support group. Your health care provider may be able to recommend one. Keep all follow-up visits as told by your health care provider. This is important.  Where to find more information National Alliance on Mental Illness: www.nami.org [place] National Institute of Mental Health: www.nimh.nih.gov Contact a health care provider if: Your symptoms get worse. You develop new symptoms. Get help right away if: You self-harm. You have serious thoughts about hurting yourself or others. You hallucinate. If you ever feel like you may hurt yourself or  others, or have thoughts about taking your own life, get help right away. Go to your nearest emergency department or: Call your local emergency services (911 in the [place] Call a suicide crisis helpline, such as the National Suicide Prevention Lifeline at 1-800-273-8255. This is open 24 hours a day in the [place] Text the Crisis Text Line at 741741 (in the [place] Summary Major depressive disorder (MDD) is a mental health condition. MDD causes symptoms of sadness, hopelessness, and loss of interest in things. These symptoms last most of the day, almost every day, for 2 weeks. The symptoms of MDD can interfere with relationships and with everyday activities. Treatments and support are available for people who develop MDD. You may need more than one type of treatment. Get help right away if you have serious thoughts about hurting yourself or others. This information is not intended to replace advice given to you by your health care provider. Make sure you discuss any questions you have with your healthcare provider. Document Revised: 03/30/2019 Document Reviewed: 03/30/2019 Elsevier Patient Education  2022 [employer]  

## 2020-12-03 NOTE — Progress Notes (Signed)
Subjective:    Patient ID: Sherry Glass, female    DOB: 15-Nov-1970, 50 y.o.   MRN: [id number]  Chief Complaint  Patient presents with   Medical Management of Chronic Issues   Shoulder Pain    Left pain x 2 mths has happened years ago had steroid injection and it helped went to PT with no help.    Pt presents to the office today for chronic follow up. Pt is followed by Pulmonologist 6 months for COPD, Restrictive lung disease, and SOB. Followed by Endocrinologists yearly for hypothyroidism. She had thyroid ablation.   She is followed by [facility] every 6 months for chronic back pain.  Shoulder Pain  The pain is present in the left shoulder. This is a new problem. The current episode started more than 1 month ago. The problem occurs intermittently. The quality of the pain is described as aching.  Hypertension This is a chronic problem. The current episode started more than 1 year ago. The problem has been resolved since onset. The problem is controlled. Associated symptoms include anxiety and malaise/fatigue. Pertinent negatives include no blurred vision, peripheral edema or shortness of breath. Risk factors for coronary artery disease include dyslipidemia, obesity and sedentary lifestyle. The current treatment provides moderate improvement.  Gastroesophageal Reflux She complains of belching and heartburn. This is a chronic problem. The current episode started more than 1 year ago. The problem occurs frequently. She has tried a PPI for the symptoms. The treatment provided moderate relief.  Diabetes She presents for her follow-up diabetic visit. She has type 2 diabetes mellitus. Hypoglycemia symptoms include nervousness/anxiousness. Associated symptoms include foot paresthesias. Pertinent negatives for diabetes include no blurred vision. There are no hypoglycemic complications. Symptoms are stable. Diabetic complications include heart disease. Risk factors for coronary artery disease include  dyslipidemia, diabetes mellitus, hypertension, sedentary lifestyle and post-menopausal. She is following a generally healthy diet. Her overall blood glucose range is 90-110 mg/dl. An ACE inhibitor/angiotensin II receptor blocker is being taken.  Hyperlipidemia This is a chronic problem. The current episode started more than 1 year ago. Exacerbating diseases include obesity. Pertinent negatives include no shortness of breath. Current antihyperlipidemic treatment includes statins. The current treatment provides moderate improvement of lipids. Risk factors for coronary artery disease include dyslipidemia, female sex, hypertension, a sedentary lifestyle and post-menopausal.  Anxiety Presents for follow-up visit. Symptoms include depressed mood, excessive worry, irritability, nervous/anxious behavior and restlessness. Patient reports no shortness of breath. Symptoms occur most days. The severity of symptoms is moderate.    Depression        This is a chronic problem.  The current episode started more than 1 year ago.   Associated symptoms include helplessness, hopelessness, irritable, restlessness and sad.  Past medical history includes anxiety.   COPD   Review of Systems  Constitutional:  Positive for irritability and malaise/fatigue.  Eyes:  Negative for blurred vision.  Respiratory:  Negative for shortness of breath.   Gastrointestinal:  Positive for heartburn.  Psychiatric/Behavioral:  Positive for depression. The patient is nervous/anxious.   All other systems reviewed and are negative.     Objective:   Physical Exam Vitals reviewed.  Constitutional:      General: She is irritable. She is not in acute distress.    Appearance: She is well-developed. She is obese.  HENT:     Head: Normocephalic and atraumatic.     Right Ear: Tympanic membrane normal.     Left Ear: Tympanic membrane normal.  Eyes:  Pupils: Pupils are equal, round, and reactive to light.  Neck:     Thyroid: No  thyromegaly.  Cardiovascular:     Rate and Rhythm: Normal rate and regular rhythm.     Heart sounds: Normal heart sounds. No murmur heard. Pulmonary:     Effort: Pulmonary effort is normal. No respiratory distress.     Breath sounds: Normal breath sounds. No wheezing.  Abdominal:     General: Bowel sounds are normal. There is no distension.     Palpations: Abdomen is soft.     Tenderness: There is no abdominal tenderness.  Musculoskeletal:        General: Tenderness present. Normal range of motion.     Cervical back: Normal range of motion and neck supple.     Comments: Left shoulder pain with abduction   Skin:    General: Skin is warm and dry.  Neurological:     Mental Status: She is alert and oriented to person, place, and time.     Cranial Nerves: No cranial nerve deficit.     Deep Tendon Reflexes: Reflexes are normal and symmetric.  Psychiatric:        Mood and Affect: Affect is tearful.        Behavior: Behavior normal.        Thought Content: Thought content normal.        Judgment: Judgment normal.     BP 133/80   Pulse 76   Temp (!) 97.1 F (36.2 C) (Temporal)   Ht '5\' 4"'  (1.626 m)   Wt 205 lb 9.6 oz (93.3 kg)   SpO2 97%   BMI 35.29 kg/m      Assessment & Plan:  Sherry Glass comes in today with chief complaint of Medical Management of Chronic Issues and Shoulder Pain (Left pain x 2 mths has happened years ago had steroid injection and it helped went to PT with no help. )   Diagnosis and orders addressed:  1. Chronic bilateral low back pain with bilateral sciatica - gabapentin (NEURONTIN) 100 MG capsule; Take 300 mg in AM, 100 mg in afternoon, and 300 mg at night.  Dispense: 210 capsule; Refill: 1 - CMP14+EGFR  2. Chronic bilateral low back pain with sciatica, sciatica laterality unspecified - cyclobenzaprine (FLEXERIL) 10 MG tablet; Take 1 tablet (10 mg total) by mouth 3 (three) times daily as needed. for muscle spams  Dispense: 90 tablet; Refill: 1 -  CMP14+EGFR  3. Chronic obstructive pulmonary disease with acute exacerbation (HCC) - budesonide (PULMICORT) 0.5 MG/2ML nebulizer solution; Take 2 mLs (0.5 mg total) by nebulization 2 (two) times daily.  Dispense: 120 mL; Refill: 3 - CMP14+EGFR  4. COPD exacerbation (HCC) - albuterol (PROVENTIL) (2.5 MG/3ML) 0.083% nebulizer solution; Use 1 vial (3 mL) via nebulizer every 6 hours as needed for wheezing or shortness of breath  Dispense: 150 mL; Refill: 5 - albuterol (VENTOLIN HFA) 108 (90 Base) MCG/ACT inhaler; Inhale 2 puffs into the lungs every 6 (six) hours as needed for wheezing or shortness of breath.  Dispense: 8 g; Refill: 2 - CMP14+EGFR  5. Hyperlipidemia associated with type 2 diabetes mellitus (HCC) - rosuvastatin (CRESTOR) 10 MG tablet; Take 1 tablet (10 mg total) by mouth daily.  Dispense: 90 tablet; Refill: 3 - CMP14+EGFR  6. Hypertension associated with diabetes ([place]) - losartan (COZAAR) 100 MG tablet; Take 1 tablet (100 mg total) by mouth daily.  Dispense: 90 tablet; Refill: 1 - CMP14+EGFR  7. Uncontrolled type 2 diabetes mellitus  with hyperglycemia ([place]) - metFORMIN (GLUCOPHAGE) 1000 MG tablet; Take 1 tablet (1,000 mg total) by mouth 2 (two) times daily with a meal. (NEEDS TO BE SEEN BEFORE NEXT REFILL)  Dispense: 180 tablet; Refill: 1 - Dulaglutide (TRULICITY) 1.5 OB/0.9GG SOPN; Inject 1.5 mg into the skin once a week.  Dispense: 12 mL; Refill: 2 - glimepiride (AMARYL) 2 MG tablet; Take 1 tablet (2 mg total) by mouth daily with breakfast. (NEEDS TO BE SEEN BEFORE NEXT REFILL)  Dispense: 90 tablet; Refill: 1 - Bayer DCA Hb A1c Waived - CMP14+EGFR  8. Gastroesophageal reflux disease, unspecified whether esophagitis present - CMP14+EGFR  9. Hypothyroidism following radioiodine therapy - CMP14+EGFR  10. Morbid obesity ([place]) - CMP14+EGFR  11. Depression, major, single episode, mild (HCC) -Start Lexapro 10 mg then increase to 20 mg  Stress management  - CMP14+EGFR -  escitalopram (LEXAPRO) 10 MG tablet; Take 1 tablet (10 mg total) by mouth daily for 14 days, THEN 2 tablets (20 mg total) daily for 14 days.  Dispense: 90 tablet; Refill: 3 - escitalopram (LEXAPRO) 20 MG tablet; Take 1 tablet (20 mg total) by mouth daily.  Dispense: 30 tablet; Refill: 5  12. GAD (generalized anxiety disorder)  - CMP14+EGFR - escitalopram (LEXAPRO) 10 MG tablet; Take 1 tablet (10 mg total) by mouth daily for 14 days, THEN 2 tablets (20 mg total) daily for 14 days.  Dispense: 90 tablet; Refill: 3 - escitalopram (LEXAPRO) 20 MG tablet; Take 1 tablet (20 mg total) by mouth daily.  Dispense: 30 tablet; Refill: 5  13. Left shoulder tendinitis  - diclofenac (VOLTAREN) 75 MG EC tablet; Take 1 tablet (75 mg total) by mouth 2 (two) times daily.  Dispense: 30 tablet; Refill: 0 - predniSONE (STERAPRED UNI-PAK 21 TAB) 10 MG (21) TBPK tablet; Use as directed  Dispense: 21 tablet; Refill: 0   Labs pending Health Maintenance reviewed Diet and exercise encouraged  Follow up plan: 6 weeks to recheck GAD and depression    Evelina Dun, [name]

## 2020-12-04 LAB — CMP14+EGFR
ALT: 26 IU/L (ref 0–32)
AST: 17 IU/L (ref 0–40)
Albumin/Globulin Ratio: 2 (ref 1.2–2.2)
Albumin: 4.6 g/dL (ref 3.8–4.9)
Alkaline Phosphatase: 90 IU/L (ref 44–121)
BUN/Creatinine Ratio: 15 (ref 9–23)
BUN: 10 mg/dL (ref 6–24)
Bilirubin Total: 0.6 mg/dL (ref 0.0–1.2)
CO2: 22 mmol/L (ref 20–29)
Calcium: 9.8 mg/dL (ref 8.7–10.2)
Chloride: 105 mmol/L (ref 96–106)
Creatinine, Ser: 0.68 mg/dL (ref 0.57–1.00)
Globulin, Total: 2.3 g/dL (ref 1.5–4.5)
Glucose: 138 mg/dL — ABNORMAL HIGH (ref 65–99)
Potassium: 4.5 mmol/L (ref 3.5–5.2)
Sodium: 141 mmol/L (ref 134–144)
Total Protein: 6.9 g/dL (ref 6.0–8.5)
eGFR: 105 mL/min/{1.73_m2} (ref >59)

## 2020-12-07 ENCOUNTER — Telehealth: Payer: Self-pay | Admitting: *Deleted

## 2020-12-07 NOTE — Telephone Encounter (Signed)
Prior auth for Diclofenac Sod EC 75 mg approved 12/04/20-12/04/21.

## 2020-12-21 ENCOUNTER — Telehealth: Payer: Self-pay | Admitting: *Deleted

## 2020-12-21 DIAGNOSIS — F320 Major depressive disorder, single episode, mild: Secondary | ICD-10-CM

## 2020-12-21 DIAGNOSIS — F411 Generalized anxiety disorder: Secondary | ICD-10-CM

## 2020-12-21 MED ORDER — ESCITALOPRAM OXALATE 10 MG PO TABS: ORAL_TABLET | ORAL | 0 refills | Status: DC

## 2020-12-21 NOTE — Telephone Encounter (Signed)
Escitalopram Oxalate 10MG  tablets Key:    PA not sent  - the QTY was changed and RX re-sent to pharm

## 2020-12-22 ENCOUNTER — Other Ambulatory Visit: Payer: Self-pay | Admitting: Family

## 2020-12-22 DIAGNOSIS — M25512 Pain in left shoulder: Secondary | ICD-10-CM

## 2020-12-29 ENCOUNTER — Ambulatory Visit: Payer: Medicaid Other | Admitting: Orthopedic Surgery

## 2020-12-29 ENCOUNTER — Encounter: Payer: Self-pay | Admitting: Orthopedic Surgery

## 2020-12-29 ENCOUNTER — Other Ambulatory Visit: Payer: Self-pay

## 2020-12-29 ENCOUNTER — Ambulatory Visit: Payer: Medicaid Other

## 2020-12-29 VITALS — BP 154/83 | HR 76 | Ht 64.0 in | Wt 203.0 lb

## 2020-12-29 DIAGNOSIS — M25512 Pain in left shoulder: Secondary | ICD-10-CM

## 2020-12-29 DIAGNOSIS — G8929 Other chronic pain: Secondary | ICD-10-CM

## 2020-12-29 DIAGNOSIS — M7502 Adhesive capsulitis of left shoulder: Secondary | ICD-10-CM | POA: Diagnosis not present

## 2020-12-29 NOTE — Patient Instructions (Signed)

## 2020-12-29 NOTE — Progress Notes (Signed)
New Patient Visit  Assessment: Sherry Glass is a 50 y.o. RHYD female with the following: 1. Adhesive capsulitis of left shoulder  Plan: Patient has stiffness and pain in her left shoulder. Atraumatic onset.  XR are normal.  ON physical exam, she has restricted ROM and exhibits pain with both passive and active ROM.  Presentation most consistent with adhesive capsulitis.  Pathology and prognosis discussed.  All questions answered.  She is interested in proceeding with a subacromial steroid injection.  General shoulder exercises were provided.  Return precautions provided.  If she continues to have issues, will recommend an image guided high volume injection of the glenohumeral joint.   Procedure note injection - Left Shoulder joint - subacromial space   Verbal consent was obtained to inject the Left Shoulder joint (subacromial space) Timeout was completed to confirm the site of injection.  The skin was prepped with alcohol and ethyl chloride was sprayed at the injection site.  A 21-gauge needle was used to inject 6 mg of Betamethasone and 1% lidocaine (3 cc) into the Left Shoulder using an Posterolateral approach.  There were no complications. A sterile bandage was applied.    Follow-up: Return if symptoms worsen or fail to improve.  Subjective:  Chief Complaint  Patient presents with   Shoulder Pain    Pt with left shoulder pain that was on and off for 3-4 months but has gotten worse over past month.     History of Present Illness: Sherry Glass is a 49 y.o. female who has been referred to clinic today by Jannifer Rodney, [name] for evaluation of left shoulder pain.  She has had pain and stiffness in her left shoulder for the past 3-4 months.  No specific injury.  She has tried Tylenol, ibuprofen heat and ice, with minimal improvement in her symptoms.  She has not had an injection recently.  She does report that she had 1 in her shoulder over 10 years ago.  Her pain is worse at night.  She  has difficulty lifting objects.  Pain is diffuse around her shoulder, she noticed catching sensations when she moves it sometimes.  She does have some pain radiating distally into her left elbow.  No numbness or tingling distally.   Review of Systems: No fevers or chills No numbness or tingling No chest pain No shortness of breath No bowel or bladder dysfunction No GI distress No headaches   Medical History:  Past Medical History:  Diagnosis Date   Allergic rhinitis    Anxiety and depression    Cataract    Chest pain syndrome    COPD (chronic obstructive pulmonary disease) (HCC)    Depression    Diabetes mellitus without complication (HCC)    History of kidney stones    HTN (hypertension)    Hypercholesterolemia    Hyperglycemia    Hypertension    Hypothyroidism    Obesity    Restless leg syndrome    Tobacco abuse     Past Surgical History:  Procedure Laterality Date   ABDOMINAL HYSTERECTOMY     CARDIAC CATHETERIZATION  2014   CATARACT EXTRACTION W/PHACO Left 06/23/2016   Procedure: CATARACT EXTRACTION PHACO AND INTRAOCULAR LENS PLACEMENT (IOC);  Surgeon: Gemma Payor, MD;  Location: [facility];  Service: Ophthalmology;  Laterality: Left;  left - pt knows to arrive at 10:50 cde 9.11   CESAREAN SECTION     CHOLECYSTECTOMY     COLONOSCOPY WITH PROPOFOL N/A 10/14/2019   Procedure: COLONOSCOPY  WITH PROPOFOL;  Surgeon: Corbin Ade, MD;  Location: [facility];  Service: Endoscopy;  Laterality: N/A;  8:00   EXCISION MASS NECK     cyst   FOOT MASS EXCISION     left foot surgery     removal of cyst   NECK SURGERY     POLYPECTOMY  10/14/2019   Procedure: POLYPECTOMY;  Surgeon: Corbin Ade, MD;  Location: [facility];  Service: Endoscopy;;   THYROID SURGERY     Dr. Lovell Sheehan   TONSILLECTOMY      Family History  Adopted: Yes   Social History   Tobacco Use   Smoking status: Former    Packs/day: 1.00    Years: 25.00    Pack years: 25.00    Types: Cigarettes     Start date: 11/13/1988    Quit date: 10/28/2016    Years since quitting: 4.1   Smokeless tobacco: Never  Vaping Use   Vaping Use: Never used  Substance Use Topics   Alcohol use: Yes    Comment: For special occasions, very rare    Drug use: No    Allergies  Allergen Reactions   Dilaudid [Hydromorphone]     Itching and vomitting   Hydromorphone    Valproic Acid And Related Nausea And Vomiting    No outpatient medications have been marked as taking for the 12/29/20 encounter (Office Visit) with Oliver Barre, MD.    Objective: BP (!) 154/83   Pulse 76   Ht 5\' 4"  (1.626 m)   Wt 203 lb (92.1 kg)   BMI 34.84 kg/m   Physical Exam:  General: Alert and oriented. and No acute distress. Gait: Normal gait.  Evaluation of left shoulder demonstrates no deformity.  No atrophy is appreciated.  Active forward flexion to 120 degrees with obvious discomfort.  Abduction to 85 degrees.  External rotation is limited to 20 degrees, compared to 45 degrees in the contralateral side.  Internal rotation to the back pocket.  5/5 infraspinatus strength.  Negative belly press.  4/5 supraspinatus strength due to pain.  Fingers are warm and well-perfused.  2+ radial pulse.  IMAGING: I personally ordered and reviewed the following images  X-ray of the left shoulder was obtained in clinic today and demonstrates no acute injuries.  There is some narrowing of the [facility] joint, with a small osteophyte in the superior aspect of the joint.  Well-maintained glenohumeral joint space.  No evidence of degenerative changes.  No proximal humeral migration.  Impression: Normal left shoulder x-rays   New Medications:  No orders of the defined types were placed in this encounter.     [facility], MD  12/29/2020 9:19 AM

## 2020-12-30 ENCOUNTER — Inpatient Hospital Stay: Admission: RE | Admit: 2020-12-30 | Payer: Medicaid Other | Source: Ambulatory Visit

## 2021-01-05 ENCOUNTER — Telehealth: Payer: Self-pay | Admitting: Orthopedic Surgery

## 2021-01-05 NOTE — Telephone Encounter (Signed)
Sherry Glass called into the office at 11:53 stating she thought she was having a reaction to the injection that Dr. Dallas Schimke gave her last week.  She said that on Wednesday or Thursday, she began itching and then her arm started to turn red.  She said she has taken Benadryl for this but also has taken Ibuprofen and Tylenol.  Would you please call and speak to the patient?  Thanks

## 2021-01-06 NOTE — Telephone Encounter (Signed)
Spoke with pt, states she has itching, burning, and bumps around area where she received the shot. Would like to have the area looked at tomorrow, asked pt to be here around 10:30.

## 2021-01-07 ENCOUNTER — Encounter: Payer: Self-pay | Admitting: Orthopedic Surgery

## 2021-01-07 ENCOUNTER — Ambulatory Visit (INDEPENDENT_AMBULATORY_CARE_PROVIDER_SITE_OTHER): Payer: Medicaid Other | Admitting: Orthopedic Surgery

## 2021-01-07 ENCOUNTER — Other Ambulatory Visit: Payer: Self-pay

## 2021-01-07 VITALS — BP 142/80 | HR 78 | Ht 64.0 in | Wt 203.0 lb

## 2021-01-07 DIAGNOSIS — T8089XA Other complications following infusion, transfusion and therapeutic injection, initial encounter: Secondary | ICD-10-CM

## 2021-01-07 MED ORDER — TRAMADOL HCL 50 MG PO TABS: 50.0000 mg | ORAL_TABLET | Freq: Four times a day (QID) | ORAL | 5 refills | Status: DC | PRN

## 2021-01-07 MED ORDER — HYDROCORTISONE 0.5 % EX CREA: 1.0000 "application " | TOPICAL_CREAM | Freq: Two times a day (BID) | CUTANEOUS | 0 refills | Status: DC

## 2021-01-07 NOTE — Progress Notes (Signed)
Orthopaedic Clinic Return  Assessment: Sherry Glass is a 50 y.o. female with the following: Reaction around left shoulder to recent steroid injection  Plan: Patient continues to have left shoulder pain following a subacromial injection.  However there are no signs of a localized infection, or intra-articular infection.  She has multiple small hospitals, which have scabbed over and are very itchy.  She continues to have severe left shoulder pain, and the injection has provided minimal relief of her symptoms.  We will treat the topical skin irritation with hydrocortisone cream.  Have also provided her with tramadol for her pain.  She will contact the clinic as needed.  We briefly discussed adhesive capsulitis once again, and given her previous reaction to a steroid injection, we may have to consider alternative treatment methods.  All questions were answered and she is amenable this plan.  Meds ordered this encounter  Medications   hydrocortisone cream 0.5 %    Sig: Apply 1 application topically 2 (two) times daily.    Dispense:  30 g    Refill:  0   traMADol (ULTRAM) 50 MG tablet    Sig: Take 1 tablet (50 mg total) by mouth every 6 (six) hours as needed.    Dispense:  60 tablet    Refill:  5      Follow-up: Return if symptoms worsen or fail to improve.   Subjective:  Chief Complaint  Patient presents with   Shoulder Pain   Skin Reaction    Pt is having a skin reaction to injection in Lt shoulder received 12/29/20. Pt states she's having itching and burning. Has been using benaryl and ibuprofen with minor relief.     History of Present Illness: SHEARON Glass is a 50 y.o. female who returns to clinic for repeat evaluation of her left shoulder.  I saw her in clinic approximately 1 week ago, at which time we provided her with a subacromial injection for left shoulder adhesive capsulitis.  She has contact the clinic noting that she has had a reaction to the injection.  She has had lots  of itching and irritation around her left shoulder.  Her pain continues.  Ultimately, we have asked her to come to clinic so we can evaluate the shoulder further.  Review of Systems: No fevers or chills No numbness or tingling No chest pain No shortness of breath No bowel or bladder dysfunction No GI distress No headaches + itching, left shoulder  Objective: BP (!) 142/80   Pulse 78   Ht 5\' 4"  (1.626 m)   Wt 203 lb (92.1 kg)   BMI 34.84 kg/m   Physical Exam:  Evaluation of left shoulder demonstrates no erythema.  There is multiple small scabs, and obvious irritation from itching.  Range of motion remains unchanged compared to previous exam.  There is no redness.  No warmth about the shoulder.  Low concern for an infection.  IMAGING: I personally ordered and reviewed the following images:  No new imaging obtained today.   , MD 01/07/2021 10:47 AM

## 2021-01-08 ENCOUNTER — Encounter: Payer: Self-pay | Admitting: Gastroenterology

## 2021-01-08 ENCOUNTER — Encounter: Payer: Self-pay | Admitting: *Deleted

## 2021-01-08 ENCOUNTER — Ambulatory Visit: Payer: Medicaid Other | Admitting: Gastroenterology

## 2021-01-08 VITALS — BP 124/77 | HR 75 | Temp 96.9°F | Ht 64.0 in | Wt 204.2 lb

## 2021-01-08 DIAGNOSIS — R1013 Epigastric pain: Secondary | ICD-10-CM | POA: Diagnosis not present

## 2021-01-08 DIAGNOSIS — R112 Nausea with vomiting, unspecified: Secondary | ICD-10-CM

## 2021-01-08 DIAGNOSIS — K760 Fatty (change of) liver, not elsewhere classified: Secondary | ICD-10-CM | POA: Diagnosis not present

## 2021-01-08 HISTORY — DX: Nausea with vomiting, unspecified: R11.2

## 2021-01-08 MED ORDER — ONDANSETRON 4 MG PO TBDP: 4.0000 mg | ORAL_TABLET | Freq: Four times a day (QID) | ORAL | 0 refills | Status: DC | PRN

## 2021-01-08 NOTE — H&P (View-Only) (Signed)
Primary Care Physician: Junie Spencer, [name]  Primary Gastroenterologist:  Roetta Sessions, MD   Chief Complaint  Patient presents with   Emesis    Smells like rotten eggs   Abdominal Pain    Every now and then    HPI: Sherry Glass is a 50 y.o. female here for follow-up.  She was last seen in August 2021 for fatty liver with minimally elevated ALT.  Right upper quadrant ultrasound with diffuse hepatic steatosis.  Negative viral hepatitis panel. She has a history of IBS-D.  Presents today with complains of belching up egg smell for several months. Tried otc prilosec, "anything for heartburn" and nothing helps. Nausea most days. Vomiting 2-3 times per week. No hematemesis. Some epigastric pain but not necessarily related to meals. Currently BMs are regular. No melena, brbpr. No heartburn.  Recently started ibuprofen for shoulder one month ago. Takes intermittently. Takes ASA 81mg  daily. No other ASA products.   Diabetes since 1997.  Colonoscopy October 14, 2019: 2 sessile polyps in the sigmoid colon from 4 to 5 mm in size, scattered small bowel diverticula in the sigmoid colon.  Surgical pathology found the polyps to be hyperplastic.  Recommended repeat colonoscopy in 10 years.  Current Outpatient Medications  Medication Sig Dispense Refill   albuterol (PROVENTIL) (2.5 MG/3ML) 0.083% nebulizer solution Use 1 vial (3 mL) via nebulizer every 6 hours as needed for wheezing or shortness of breath 150 mL 5   albuterol (VENTOLIN HFA) 108 (90 Base) MCG/ACT inhaler Inhale 2 puffs into the lungs every 6 (six) hours as needed for wheezing or shortness of breath. 8 g 2   aspirin EC 81 MG tablet Take one po qd (Patient taking differently: Take 81 mg by mouth daily.) 90 tablet 1   budesonide (PULMICORT) 0.5 MG/2ML nebulizer solution Take 2 mLs (0.5 mg total) by nebulization 2 (two) times daily. 120 mL 3   cyclobenzaprine (FLEXERIL) 10 MG tablet Take 1 tablet (10 mg total) by mouth 3 (three) times  daily as needed. for muscle spams 90 tablet 1   diclofenac (VOLTAREN) 75 MG EC tablet Take 1 tablet (75 mg total) by mouth 2 (two) times daily. 30 tablet 0   Dulaglutide (TRULICITY) 1.5 MG/0.5ML SOPN Inject 1.5 mg into the skin once a week. 12 mL 2   escitalopram (LEXAPRO) 20 MG tablet Take 1 tablet (20 mg total) by mouth daily. 30 tablet 5   gabapentin (NEURONTIN) 100 MG capsule Take 300 mg in AM, 100 mg in afternoon, and 300 mg at night. 210 capsule 1   glimepiride (AMARYL) 2 MG tablet Take 1 tablet (2 mg total) by mouth daily with breakfast. (NEEDS TO BE SEEN BEFORE NEXT REFILL) 90 tablet 1   Glycopyrrolate-Formoterol (BEVESPI AEROSPHERE) 9-4.8 MCG/ACT AERO Inhale 2 puffs into the lungs 2 (two) times daily. 5.9 g 0   hydrocortisone cream 0.5 % Apply 1 application topically 2 (two) times daily. 30 g 0   levothyroxine (SYNTHROID) 100 MCG tablet Take 1 tablet (100 mcg total) by mouth daily before breakfast. 95 tablet 3   losartan (COZAAR) 100 MG tablet Take 1 tablet (100 mg total) by mouth daily. 90 tablet 1   metFORMIN (GLUCOPHAGE) 1000 MG tablet Take 1 tablet (1,000 mg total) by mouth 2 (two) times daily with a meal. (NEEDS TO BE SEEN BEFORE NEXT REFILL) 180 tablet 1   rosuvastatin (CRESTOR) 10 MG tablet Take 1 tablet (10 mg total) by mouth daily. 90 tablet 3  traMADol (ULTRAM) 50 MG tablet Take 1 tablet (50 mg total) by mouth every 6 (six) hours as needed. 60 tablet 5   escitalopram (LEXAPRO) 10 MG tablet Take 1 tablet (10 mg total) by mouth daily for 14 days, THEN 2 tablets (20 mg total) daily for 14 days. 90 tablet 3   No current facility-administered medications for this visit.    Allergies as of 01/08/2021 - Review Complete 01/08/2021  Allergen Reaction Noted   Dilaudid [hydromorphone]  05/13/2016   Hydromorphone  07/03/2012   Valproic acid and related Nausea And Vomiting 07/03/2012   Past Medical History:  Diagnosis Date   Allergic rhinitis    Anxiety and depression    Cataract     Chest pain syndrome    COPD (chronic obstructive pulmonary disease) (HCC)    Depression    Diabetes mellitus without complication (HCC)    History of kidney stones    HTN (hypertension)    Hypercholesterolemia    Hyperglycemia    Hypertension    Hypothyroidism    Obesity    Restless leg syndrome    Tobacco abuse    Past Surgical History:  Procedure Laterality Date   ABDOMINAL HYSTERECTOMY     CARDIAC CATHETERIZATION  2014   CATARACT EXTRACTION W/PHACO Left 06/23/2016   Procedure: CATARACT EXTRACTION PHACO AND INTRAOCULAR LENS PLACEMENT (IOC);  Surgeon: Gemma Payor, MD;  Location: [facility];  Service: Ophthalmology;  Laterality: Left;  left - pt knows to arrive at 10:50 cde 9.11   CESAREAN SECTION     CHOLECYSTECTOMY     COLONOSCOPY WITH PROPOFOL N/A 10/14/2019   Procedure: COLONOSCOPY WITH PROPOFOL;  Surgeon: Corbin Ade, MD;  Location: [facility];  Service: Endoscopy;  Laterality: N/A;  8:00   EXCISION MASS NECK     cyst   FOOT MASS EXCISION     left foot surgery     removal of cyst   NECK SURGERY     POLYPECTOMY  10/14/2019   Procedure: POLYPECTOMY;  Surgeon: Corbin Ade, MD;  Location: [facility];  Service: Endoscopy;;   THYROID SURGERY     Dr. Lovell Sheehan   TONSILLECTOMY     Family History  Adopted: Yes   Social History   Tobacco Use   Smoking status: Former    Packs/day: 1.00    Years: 25.00    Pack years: 25.00    Types: Cigarettes    Start date: 11/13/1988    Quit date: 10/28/2016    Years since quitting: 4.2   Smokeless tobacco: Never  Vaping Use   Vaping Use: Never used  Substance Use Topics   Alcohol use: Not Currently    Comment: For special occasions, very rare    Drug use: No    ROS:  General: Negative for anorexia, weight loss, fever, chills, fatigue, weakness. ENT: Negative for hoarseness, difficulty swallowing , nasal congestion. CV: Negative for chest pain, angina, palpitations, dyspnea on exertion, peripheral edema.  Respiratory:  Negative for dyspnea at rest, dyspnea on exertion, cough, sputum, wheezing.  GI: See history of present illness. GU:  Negative for dysuria, hematuria, urinary incontinence, urinary frequency, nocturnal urination.  Endo: Negative for unusual weight change.    Physical Examination:   BP 124/77   Pulse 75   Temp (!) 96.9 F (36.1 C) (Temporal)   Ht 5\' 4"  (1.626 m)   Wt 204 lb 3.2 oz (92.6 kg)   BMI 35.05 kg/m   General: Well-nourished, well-developed in no acute  distress.  Eyes: No icterus. Mouth: Oropharyngeal mucosa moist and pink , no lesions erythema or exudate. Lungs: Clear to auscultation bilaterally.  Heart: Regular rate and rhythm, no murmurs rubs or gallops.  Abdomen: Bowel sounds are normal, nontender, nondistended, no hepatosplenomegaly or masses, no abdominal bruits or hernia , no rebound or guarding.   Extremities: No lower extremity edema. No clubbing or deformities. Neuro: Alert and oriented x 4   Skin: Warm and dry, no jaundice.   Psych: Alert and cooperative, normal mood and affect.  Labs:  Lab Results  Component Value Date   CREATININE 0.68 12/03/2020   BUN 10 12/03/2020   NA 141 12/03/2020   K 4.5 12/03/2020   CL 105 12/03/2020   CO2 22 12/03/2020   Lab Results  Component Value Date   ALT 26 12/03/2020   AST 17 12/03/2020   ALKPHOS 90 12/03/2020   BILITOT 0.6 12/03/2020   Lab Results  Component Value Date   WBC 10.4 09/18/2020   HGB 14.0 09/18/2020   HCT 41.8 09/18/2020   MCV 97 09/18/2020   PLT 294 09/18/2020   Lab Results  Component Value Date   HGBA1C 6.4 12/03/2020     Imaging Studies: DG Shoulder Left  Result Date: 01/04/2021 X-ray of the left shoulder was obtained in clinic today and demonstrates no acute injuries.  There is some narrowing of the [facility] joint, with a small osteophyte in the superior aspect of the joint.  Well-maintained glenohumeral joint space.  No evidence of degenerative changes.  No proximal humeral migration.    Impression: Normal left shoulder x-rays    Assessment:  Pleasant 50 y/o female presenting for further evaluation of upper abdominal pain, N/V. Symptoms for several months. PPI, OTC antacids not helping. She has longstanding history of type 2 DM. Current A1C 6.4 but has not always been adequately controlled.   Suspect symptoms secondary to diabetic gastroparesis but partial gastric outlet obstruction from stricture/ulcer needs to be excluded as well.   Fatty liver noted on prior imaging. Historically her ALT is been mildly elevated but more recently has been normal which is likely directly related to improved glycemic control. Prior viral markers negative for Hep B and C.   Plan: Egd with Dr. Jena Gauss with propofol. ASA II.  I have discussed the risks, alternatives, benefits with regards to but not limited to the risk of reaction to medication, bleeding, infection, perforation and the patient is agreeable to proceed. Written consent to be obtained. Zofran 4mg  every 6 hours as needed.  Gastroparesis diet. Eat 5-6 small snacks daily.  Continue low fat diet, exercise. Goal of slow gradual weight loss for fatty liver.

## 2021-01-08 NOTE — Patient Instructions (Signed)
For upper abdominal pain and vomiting, we plan for upper endoscopy. You may have gastroparesis (slow emptying stomach due to your diabetes) but we need to rule out other causes. You can take zofran one tablet every six hours as needed for nausea/vomiting. RX sent to your pharmacy.  Try eating low fat foods, small portions, as this is easier to digest. Concentrate on "snacking" 5-6 times a day rather than eating larger meals. See information provided in your handouts. For fatty liver, continue to control your diabetes, strive for exercise and weight loss. See handout.

## 2021-01-08 NOTE — Progress Notes (Signed)
Primary Care Physician: Junie Spencer, [name]  Primary Gastroenterologist:  Roetta Sessions, MD   Chief Complaint  Patient presents with   Emesis    Smells like rotten eggs   Abdominal Pain    Every now and then    HPI: Sherry Glass is a 50 y.o. female here for follow-up.  She was last seen in August 2021 for fatty liver with minimally elevated ALT.  Right upper quadrant ultrasound with diffuse hepatic steatosis.  Negative viral hepatitis panel. She has a history of IBS-D.  Presents today with complains of belching up egg smell for several months. Tried otc prilosec, "anything for heartburn" and nothing helps. Nausea most days. Vomiting 2-3 times per week. No hematemesis. Some epigastric pain but not necessarily related to meals. Currently BMs are regular. No melena, brbpr. No heartburn.  Recently started ibuprofen for shoulder one month ago. Takes intermittently. Takes ASA 81mg  daily. No other ASA products.   Diabetes since 1997.  Colonoscopy October 14, 2019: 2 sessile polyps in the sigmoid colon from 4 to 5 mm in size, scattered small bowel diverticula in the sigmoid colon.  Surgical pathology found the polyps to be hyperplastic.  Recommended repeat colonoscopy in 10 years.  Current Outpatient Medications  Medication Sig Dispense Refill   albuterol (PROVENTIL) (2.5 MG/3ML) 0.083% nebulizer solution Use 1 vial (3 mL) via nebulizer every 6 hours as needed for wheezing or shortness of breath 150 mL 5   albuterol (VENTOLIN HFA) 108 (90 Base) MCG/ACT inhaler Inhale 2 puffs into the lungs every 6 (six) hours as needed for wheezing or shortness of breath. 8 g 2   aspirin EC 81 MG tablet Take one po qd (Patient taking differently: Take 81 mg by mouth daily.) 90 tablet 1   budesonide (PULMICORT) 0.5 MG/2ML nebulizer solution Take 2 mLs (0.5 mg total) by nebulization 2 (two) times daily. 120 mL 3   cyclobenzaprine (FLEXERIL) 10 MG tablet Take 1 tablet (10 mg total) by mouth 3 (three) times  daily as needed. for muscle spams 90 tablet 1   diclofenac (VOLTAREN) 75 MG EC tablet Take 1 tablet (75 mg total) by mouth 2 (two) times daily. 30 tablet 0   Dulaglutide (TRULICITY) 1.5 MG/0.5ML SOPN Inject 1.5 mg into the skin once a week. 12 mL 2   escitalopram (LEXAPRO) 20 MG tablet Take 1 tablet (20 mg total) by mouth daily. 30 tablet 5   gabapentin (NEURONTIN) 100 MG capsule Take 300 mg in AM, 100 mg in afternoon, and 300 mg at night. 210 capsule 1   glimepiride (AMARYL) 2 MG tablet Take 1 tablet (2 mg total) by mouth daily with breakfast. (NEEDS TO BE SEEN BEFORE NEXT REFILL) 90 tablet 1   Glycopyrrolate-Formoterol (BEVESPI AEROSPHERE) 9-4.8 MCG/ACT AERO Inhale 2 puffs into the lungs 2 (two) times daily. 5.9 g 0   hydrocortisone cream 0.5 % Apply 1 application topically 2 (two) times daily. 30 g 0   levothyroxine (SYNTHROID) 100 MCG tablet Take 1 tablet (100 mcg total) by mouth daily before breakfast. 95 tablet 3   losartan (COZAAR) 100 MG tablet Take 1 tablet (100 mg total) by mouth daily. 90 tablet 1   metFORMIN (GLUCOPHAGE) 1000 MG tablet Take 1 tablet (1,000 mg total) by mouth 2 (two) times daily with a meal. (NEEDS TO BE SEEN BEFORE NEXT REFILL) 180 tablet 1   rosuvastatin (CRESTOR) 10 MG tablet Take 1 tablet (10 mg total) by mouth daily. 90 tablet 3  traMADol (ULTRAM) 50 MG tablet Take 1 tablet (50 mg total) by mouth every 6 (six) hours as needed. 60 tablet 5   escitalopram (LEXAPRO) 10 MG tablet Take 1 tablet (10 mg total) by mouth daily for 14 days, THEN 2 tablets (20 mg total) daily for 14 days. 90 tablet 3   No current facility-administered medications for this visit.    Allergies as of 01/08/2021 - Review Complete 01/08/2021  Allergen Reaction Noted   Dilaudid [hydromorphone]  05/13/2016   Hydromorphone  07/03/2012   Valproic acid and related Nausea And Vomiting 07/03/2012   Past Medical History:  Diagnosis Date   Allergic rhinitis    Anxiety and depression    Cataract     Chest pain syndrome    COPD (chronic obstructive pulmonary disease) (HCC)    Depression    Diabetes mellitus without complication (HCC)    History of kidney stones    HTN (hypertension)    Hypercholesterolemia    Hyperglycemia    Hypertension    Hypothyroidism    Obesity    Restless leg syndrome    Tobacco abuse    Past Surgical History:  Procedure Laterality Date   ABDOMINAL HYSTERECTOMY     CARDIAC CATHETERIZATION  2014   CATARACT EXTRACTION W/PHACO Left 06/23/2016   Procedure: CATARACT EXTRACTION PHACO AND INTRAOCULAR LENS PLACEMENT (IOC);  Surgeon: Gemma Payor, MD;  Location: [facility];  Service: Ophthalmology;  Laterality: Left;  left - pt knows to arrive at 10:50 cde 9.11   CESAREAN SECTION     CHOLECYSTECTOMY     COLONOSCOPY WITH PROPOFOL N/A 10/14/2019   Procedure: COLONOSCOPY WITH PROPOFOL;  Surgeon: Corbin Ade, MD;  Location: [facility];  Service: Endoscopy;  Laterality: N/A;  8:00   EXCISION MASS NECK     cyst   FOOT MASS EXCISION     left foot surgery     removal of cyst   NECK SURGERY     POLYPECTOMY  10/14/2019   Procedure: POLYPECTOMY;  Surgeon: Corbin Ade, MD;  Location: [facility];  Service: Endoscopy;;   THYROID SURGERY     Dr. Lovell Sheehan   TONSILLECTOMY     Family History  Adopted: Yes   Social History   Tobacco Use   Smoking status: Former    Packs/day: 1.00    Years: 25.00    Pack years: 25.00    Types: Cigarettes    Start date: 11/13/1988    Quit date: 10/28/2016    Years since quitting: 4.2   Smokeless tobacco: Never  Vaping Use   Vaping Use: Never used  Substance Use Topics   Alcohol use: Not Currently    Comment: For special occasions, very rare    Drug use: No    ROS:  General: Negative for anorexia, weight loss, fever, chills, fatigue, weakness. ENT: Negative for hoarseness, difficulty swallowing , nasal congestion. CV: Negative for chest pain, angina, palpitations, dyspnea on exertion, peripheral edema.  Respiratory:  Negative for dyspnea at rest, dyspnea on exertion, cough, sputum, wheezing.  GI: See history of present illness. GU:  Negative for dysuria, hematuria, urinary incontinence, urinary frequency, nocturnal urination.  Endo: Negative for unusual weight change.    Physical Examination:   BP 124/77   Pulse 75   Temp (!) 96.9 F (36.1 C) (Temporal)   Ht 5\' 4"  (1.626 m)   Wt 204 lb 3.2 oz (92.6 kg)   BMI 35.05 kg/m   General: Well-nourished, well-developed in no acute  distress.  Eyes: No icterus. Mouth: Oropharyngeal mucosa moist and pink , no lesions erythema or exudate. Lungs: Clear to auscultation bilaterally.  Heart: Regular rate and rhythm, no murmurs rubs or gallops.  Abdomen: Bowel sounds are normal, nontender, nondistended, no hepatosplenomegaly or masses, no abdominal bruits or hernia , no rebound or guarding.   Extremities: No lower extremity edema. No clubbing or deformities. Neuro: Alert and oriented x 4   Skin: Warm and dry, no jaundice.   Psych: Alert and cooperative, normal mood and affect.  Labs:  Lab Results  Component Value Date   CREATININE 0.68 12/03/2020   BUN 10 12/03/2020   NA 141 12/03/2020   K 4.5 12/03/2020   CL 105 12/03/2020   CO2 22 12/03/2020   Lab Results  Component Value Date   ALT 26 12/03/2020   AST 17 12/03/2020   ALKPHOS 90 12/03/2020   BILITOT 0.6 12/03/2020   Lab Results  Component Value Date   WBC 10.4 09/18/2020   HGB 14.0 09/18/2020   HCT 41.8 09/18/2020   MCV 97 09/18/2020   PLT 294 09/18/2020   Lab Results  Component Value Date   HGBA1C 6.4 12/03/2020     Imaging Studies: DG Shoulder Left  Result Date: 01/04/2021 X-ray of the left shoulder was obtained in clinic today and demonstrates no acute injuries.  There is some narrowing of the [facility] joint, with a small osteophyte in the superior aspect of the joint.  Well-maintained glenohumeral joint space.  No evidence of degenerative changes.  No proximal humeral migration.    Impression: Normal left shoulder x-rays    Assessment:  Pleasant 50 y/o female presenting for further evaluation of upper abdominal pain, N/V. Symptoms for several months. PPI, OTC antacids not helping. She has longstanding history of type 2 DM. Current A1C 6.4 but has not always been adequately controlled.   Suspect symptoms secondary to diabetic gastroparesis but partial gastric outlet obstruction from stricture/ulcer needs to be excluded as well.   Fatty liver noted on prior imaging. Historically her ALT is been mildly elevated but more recently has been normal which is likely directly related to improved glycemic control. Prior viral markers negative for Hep B and C.   Plan: Egd with Dr. Jena Gauss with propofol. ASA II.  I have discussed the risks, alternatives, benefits with regards to but not limited to the risk of reaction to medication, bleeding, infection, perforation and the patient is agreeable to proceed. Written consent to be obtained. Zofran 4mg  every 6 hours as needed.  Gastroparesis diet. Eat 5-6 small snacks daily.  Continue low fat diet, exercise. Goal of slow gradual weight loss for fatty liver.

## 2021-01-19 ENCOUNTER — Telehealth: Payer: Self-pay | Admitting: Orthopedic Surgery

## 2021-01-19 DIAGNOSIS — M7502 Adhesive capsulitis of left shoulder: Secondary | ICD-10-CM

## 2021-01-19 NOTE — Telephone Encounter (Signed)
Patient called asking if the nurse had seen her Mychart message.  I told her that I wasn't sure but I would send a message also asking her to call the patient.    Would you call her when you have a moment please?

## 2021-01-19 NOTE — Telephone Encounter (Signed)
Called pt and states her pain is worse after appointment, states she can feel pain radiating into her thumb and that the medication she was given doesn't help at all. Please advise.

## 2021-01-22 NOTE — Telephone Encounter (Signed)
Called pt to give her the suggestions mentioned. Pt states she's not comfortable with the injections and she is already taking tramadol (2 at bedtime), tylenol and ibuprofen during the day and flexeril at night (due to meds making her sleepy). Pt states she has increased her exercises to 3 times a day and she has been in worsening pain.  Also states she is unable to do any of her ADL's due to pain. Pt was crying when I spoke to her. Please advise.

## 2021-01-22 NOTE — Telephone Encounter (Signed)
Pt called back to clinic for an update. Spoke with Dr. Dallas Schimke who states due to her current symptoms their is no "speedy"  treatment that we can do to get rid of her pain. Dr. Dallas Schimke has spoke with Dr. Lucie Leather about pt and feels that he would be best suited to get her the fastest relief. Referral placed and pt notified.

## 2021-02-01 ENCOUNTER — Encounter (HOSPITAL_COMMUNITY): Payer: Self-pay | Admitting: Internal Medicine

## 2021-02-01 ENCOUNTER — Ambulatory Visit (HOSPITAL_COMMUNITY)
Admission: RE | Admit: 2021-02-01 | Discharge: 2021-02-01 | Disposition: A | Discharge status: Home / Self Care | Payer: Medicaid Other | Attending: Internal Medicine | Admitting: Internal Medicine

## 2021-02-01 ENCOUNTER — Encounter (HOSPITAL_COMMUNITY)
Admission: RE | Disposition: A | Discharge status: Home / Self Care | Payer: Self-pay | Source: Home / Self Care | Attending: Internal Medicine

## 2021-02-01 ENCOUNTER — Ambulatory Visit (HOSPITAL_COMMUNITY): Payer: Medicaid Other | Admitting: Anesthesiology

## 2021-02-01 ENCOUNTER — Other Ambulatory Visit: Payer: Self-pay

## 2021-02-01 DIAGNOSIS — K760 Fatty (change of) liver, not elsewhere classified: Secondary | ICD-10-CM | POA: Diagnosis not present

## 2021-02-01 DIAGNOSIS — Z7984 Long term (current) use of oral hypoglycemic drugs: Secondary | ICD-10-CM | POA: Diagnosis not present

## 2021-02-01 DIAGNOSIS — K580 Irritable bowel syndrome with diarrhea: Secondary | ICD-10-CM | POA: Insufficient documentation

## 2021-02-01 DIAGNOSIS — Z7951 Long term (current) use of inhaled steroids: Secondary | ICD-10-CM | POA: Insufficient documentation

## 2021-02-01 DIAGNOSIS — Z885 Allergy status to narcotic agent status: Secondary | ICD-10-CM | POA: Diagnosis not present

## 2021-02-01 DIAGNOSIS — Z79899 Other long term (current) drug therapy: Secondary | ICD-10-CM | POA: Insufficient documentation

## 2021-02-01 DIAGNOSIS — Z888 Allergy status to other drugs, medicaments and biological substances status: Secondary | ICD-10-CM | POA: Insufficient documentation

## 2021-02-01 DIAGNOSIS — E119 Type 2 diabetes mellitus without complications: Secondary | ICD-10-CM | POA: Diagnosis not present

## 2021-02-01 DIAGNOSIS — Z7985 Long-term (current) use of injectable non-insulin antidiabetic drugs: Secondary | ICD-10-CM | POA: Diagnosis not present

## 2021-02-01 DIAGNOSIS — R112 Nausea with vomiting, unspecified: Secondary | ICD-10-CM

## 2021-02-01 DIAGNOSIS — Z791 Long term (current) use of non-steroidal anti-inflammatories (NSAID): Secondary | ICD-10-CM | POA: Diagnosis not present

## 2021-02-01 DIAGNOSIS — Z9981 Dependence on supplemental oxygen: Secondary | ICD-10-CM | POA: Diagnosis not present

## 2021-02-01 DIAGNOSIS — M25512 Pain in left shoulder: Secondary | ICD-10-CM | POA: Diagnosis not present

## 2021-02-01 DIAGNOSIS — Z87891 Personal history of nicotine dependence: Secondary | ICD-10-CM | POA: Insufficient documentation

## 2021-02-01 DIAGNOSIS — Z7982 Long term (current) use of aspirin: Secondary | ICD-10-CM | POA: Diagnosis not present

## 2021-02-01 DIAGNOSIS — J449 Chronic obstructive pulmonary disease, unspecified: Secondary | ICD-10-CM | POA: Diagnosis not present

## 2021-02-01 HISTORY — PX: ESOPHAGOGASTRODUODENOSCOPY (EGD) WITH PROPOFOL: SHX5813

## 2021-02-01 LAB — GLUCOSE, CAPILLARY: Glucose-Capillary: 143 mg/dL — ABNORMAL HIGH (ref 70–99)

## 2021-02-01 SURGERY — ESOPHAGOGASTRODUODENOSCOPY (EGD) WITH PROPOFOL
Anesthesia: General

## 2021-02-01 MED ORDER — LACTATED RINGERS IV SOLN: INTRAVENOUS | Status: DC

## 2021-02-01 MED ORDER — PROPOFOL 10 MG/ML IV BOLUS
INTRAVENOUS | Status: DC | PRN
  Administered 2021-02-01: 100 mg via INTRAVENOUS

## 2021-02-01 MED ORDER — PROPOFOL 500 MG/50ML IV EMUL
INTRAVENOUS | Status: DC | PRN
  Administered 2021-02-01: 150 ug/kg/min via INTRAVENOUS

## 2021-02-01 MED ORDER — LIDOCAINE HCL (CARDIAC) PF 100 MG/5ML IV SOSY
PREFILLED_SYRINGE | INTRAVENOUS | Status: DC | PRN
  Administered 2021-02-01: 50 mg via INTRAVENOUS

## 2021-02-01 NOTE — Anesthesia Preprocedure Evaluation (Signed)
Anesthesia Evaluation  Patient identified by MRN, date of birth, ID band Patient awake    Reviewed: Allergy & Precautions, H&P , NPO status , Patient's Chart, lab work & pertinent test results, reviewed documented beta blocker date and time   Airway Mallampati: II  TM Distance: >3 FB Neck ROM: full    Dental no notable dental hx. (+) Teeth Intact   Pulmonary COPD,  COPD inhaler and oxygen dependent, former smoker,    Pulmonary exam normal breath sounds clear to auscultation       Cardiovascular Exercise Tolerance: Good hypertension, negative cardio ROS   Rhythm:regular Rate:Normal     Neuro/Psych PSYCHIATRIC DISORDERS Anxiety Depression  Neuromuscular disease    GI/Hepatic Neg liver ROS, GERD  Medicated,  Endo/Other  diabetesHypothyroidism   Renal/GU negative Renal ROS  negative genitourinary   Musculoskeletal negative musculoskeletal ROS (+)   Abdominal   Peds negative pediatric ROS (+)  Hematology negative hematology ROS (+)   Anesthesia Other Findings   Reproductive/Obstetrics negative OB ROS                             Anesthesia Physical  Anesthesia Plan  ASA: 2  Anesthesia Plan: General   Post-op Pain Management:    Induction:   PONV Risk Score and Plan: 3 and Propofol infusion  Airway Management Planned:   Additional Equipment:   Intra-op Plan:   Post-operative Plan:   Informed Consent: I have reviewed the patients History and Physical, chart, labs and discussed the procedure including the risks, benefits and alternatives for the proposed anesthesia with the patient or authorized representative who has indicated his/her understanding and acceptance.     Dental Advisory Given  Plan Discussed with: CRNA  Anesthesia Plan Comments:         Anesthesia Quick Evaluation

## 2021-02-01 NOTE — Op Note (Signed)
[facility] Patient Name: Sherry Glass Procedure Date: 02/01/2021 9:51 AM MRN: [id number] Date of Birth: 02/11/1971 Attending MD: Gennette Pac , MD CSN: [id number] Age: 50 Admit Type: Outpatient Procedure:                Upper GI endoscopy Indications:              Nausea with vomiting Providers:                Gennette Pac, MD, Crystal Page, Kristine L.                            Jessee Avers, Technician Referring MD:              Medicines:                Propofol per Anesthesia Complications:            No immediate complications. Estimated Blood Loss:     Estimated blood loss: none. Procedure:                Pre-Anesthesia Assessment:                           - Prior to the procedure, a History and Physical                            was performed, and patient medications and                            allergies were reviewed. The patient's tolerance of                            previous anesthesia was also reviewed. The risks                            and benefits of the procedure and the sedation                            options and risks were discussed with the patient.                            All questions were answered, and informed consent                            was obtained. Prior Anticoagulants: The patient has                            taken no previous anticoagulant or antiplatelet                            agents. ASA Grade Assessment: II - A patient with                            mild systemic disease. After reviewing the risks  and benefits, the patient was deemed in                            satisfactory condition to undergo the procedure.                           After obtaining informed consent, the endoscope was                            passed under direct vision. Throughout the                            procedure, the patient's blood pressure, pulse, and                            oxygen saturations  were monitored continuously. The                            [id number]-[id number] ([id number]) scope was introduced through the                            mouth, and advanced to the second part of duodenum.                            The upper GI endoscopy was accomplished without                            difficulty. The patient tolerated the procedure                            well. Scope In: 10:05:06 AM Scope Out: 10:07:47 AM Total Procedure Duration: 0 hours 2 minutes 41 seconds  Findings:      The examined esophagus was normal.      The entire examined stomach was normal.      The duodenal bulb and second portion of the duodenum were normal. Impression:               - Normal esophagus.                           - Normal stomach.                           - Normal duodenal bulb and second portion of the                            duodenum.                           - No specimens collected. Moderate Sedation:      Moderate (conscious) sedation was personally administered by an       anesthesia professional. The following parameters were monitored: oxygen       saturation, heart rate, blood pressure, respiratory rate, EKG, adequacy       of pulmonary ventilation, and response to care. Recommendation:           -  Patient has a contact number available for                            emergencies. The signs and symptoms of potential                            delayed complications were discussed with the                            patient. Return to normal activities tomorrow.                            Written discharge instructions were provided to the                            patient.                           - Advance diet as tolerated.                           - Continue present medications.                           - Return to my office in 3 months. Procedure Code(s):        --- Professional ---                           [phone], Esophagogastroduodenoscopy, flexible,                             transoral; diagnostic, including collection of                            specimen(s) by brushing or washing, when performed                            (separate procedure) Diagnosis Code(s):        --- Professional ---                           R11.2, Nausea with vomiting, unspecified CPT copyright 2019 [employer]. All rights reserved. The codes documented in this report are preliminary and upon coder review may  be revised to meet current compliance requirements. Gerrit Friends. Ezri Landers, MD Gennette Pac, MD 02/01/2021 10:16:59 AM This report has been signed electronically. Number of Addenda: 0

## 2021-02-01 NOTE — Anesthesia Postprocedure Evaluation (Signed)
Anesthesia Post Note  Patient: Sherry Glass  Procedure(s) Performed: ESOPHAGOGASTRODUODENOSCOPY (EGD) WITH PROPOFOL  Patient location during evaluation: Phase II Anesthesia Type: General Level of consciousness: awake Pain management: pain level controlled Vital Signs Assessment: post-procedure vital signs reviewed and stable Respiratory status: spontaneous breathing and respiratory function stable Cardiovascular status: blood pressure returned to baseline and stable Postop Assessment: no headache and no apparent nausea or vomiting Anesthetic complications: no Comments: Late entry   No notable events documented.   Last Vitals:  Vitals:   02/01/21 0820 02/01/21 1011  BP: 138/83 112/68  Pulse: 86   Resp: 20 18  Temp: 36.6 C 36.6 C  SpO2: 96% 97%    Last Pain:  Vitals:   02/01/21 1011  TempSrc: Oral  PainSc: 7                  Windell Norfolk

## 2021-02-01 NOTE — Anesthesia Procedure Notes (Signed)
Date/Time: 02/01/2021 10:05 AM Performed by: Julian Reil, CRNA Pre-anesthesia Checklist: Patient identified, Emergency Drugs available, Suction available and Patient being monitored Patient Re-evaluated:Patient Re-evaluated prior to induction Oxygen Delivery Method: Nasal cannula Induction Type: IV induction Placement Confirmation: positive ETCO2

## 2021-02-01 NOTE — Transfer of Care (Signed)
Immediate Anesthesia Transfer of Care Note  Patient: Sherry Glass  Procedure(s) Performed: ESOPHAGOGASTRODUODENOSCOPY (EGD) WITH PROPOFOL  Patient Location: Endoscopy Unit  Anesthesia Type:General  Level of Consciousness: awake  Airway & Oxygen Therapy: Patient Spontanous Breathing  Post-op Assessment: Report given to RN and Post -op Vital signs reviewed and stable  Post vital signs: Reviewed and stable  Last Vitals:  Vitals Value Taken Time  BP    Temp    Pulse    Resp    SpO2      Last Pain:  Vitals:   02/01/21 1011  TempSrc: Oral  PainSc:       Patients Stated Pain Goal: 9 (02/01/21 0820)  Complications: No notable events documented.

## 2021-02-01 NOTE — Discharge Instructions (Signed)
EGD Discharge instructions Please read the instructions outlined below and refer to this sheet in the next few weeks. These discharge instructions provide you with general information on caring for yourself after you leave the hospital. Your doctor may also give you specific instructions. While your treatment has been planned according to the most current medical practices available, unavoidable complications occasionally occur. If you have any problems or questions after discharge, please call your doctor. ACTIVITY You may resume your regular activity but move at a slower pace for the next 24 hours.  Take frequent rest periods for the next 24 hours.  Walking will help expel (get rid of) the air and reduce the bloated feeling in your abdomen.  No driving for 24 hours (because of the anesthesia (medicine) used during the test).  You may shower.  Do not sign any important legal documents or operate any machinery for 24 hours (because of the anesthesia used during the test).  NUTRITION Drink plenty of fluids.  You may resume your normal diet.  Begin with a light meal and progress to your normal diet.  Avoid alcoholic beverages for 24 hours or as instructed by your caregiver.  MEDICATIONS You may resume your normal medications unless your caregiver tells you otherwise.  WHAT YOU CAN EXPECT TODAY You may experience abdominal discomfort such as a feeling of fullness or "gas" pains.  FOLLOW-UP Your doctor will discuss the results of your test with you.  SEEK IMMEDIATE MEDICAL ATTENTION IF ANY OF THE FOLLOWING OCCUR: Excessive nausea (feeling sick to your stomach) and/or vomiting.  Severe abdominal pain and distention (swelling).  Trouble swallowing.  Temperature over 101 F (37.8 C).  Rectal bleeding or vomiting of blood.    Your upper GI tract appeared normal  Begin Protonix 40 mg once daily new prescription provided  Office visit with [place] Coast) in 3 months  At patient request, I  called Maureen Chatters at [phone] findings and recommendations

## 2021-02-01 NOTE — Interval H&P Note (Signed)
History and Physical Interval Note:  02/01/2021 9:57 AM  Sherry Glass  has presented today for surgery, with the diagnosis of nausea, vomiting, epigastric pain.  The various methods of treatment have been discussed with the patient and family. After consideration of risks, benefits and other options for treatment, the patient has consented to  Procedure(s) with comments: ESOPHAGOGASTRODUODENOSCOPY (EGD) WITH PROPOFOL (N/A) - 10:00am as a surgical intervention.  The patient's history has been reviewed, patient examined, no change in status, stable for surgery.  I have reviewed the patient's chart and labs.  Questions were answered to the patient's satisfaction.     Sherry Glass  No change.  Patient denies dysphagia.  Diagnostic EGD per plan. The risks, benefits, limitations, alternatives and imponderables have been reviewed with the patient. Potential for esophageal dilation, biopsy, etc. have also been reviewed.  Questions have been answered. All parties agreeable.

## 2021-02-05 DIAGNOSIS — J40 Bronchitis, not specified as acute or chronic: Secondary | ICD-10-CM | POA: Diagnosis not present

## 2021-02-08 ENCOUNTER — Encounter (HOSPITAL_COMMUNITY): Payer: Self-pay | Admitting: Internal Medicine

## 2021-02-19 ENCOUNTER — Other Ambulatory Visit: Payer: Self-pay | Admitting: Physical Medicine & Rehabilitation

## 2021-02-19 DIAGNOSIS — M5410 Radiculopathy, site unspecified: Secondary | ICD-10-CM

## 2021-02-21 DIAGNOSIS — R112 Nausea with vomiting, unspecified: Secondary | ICD-10-CM

## 2021-02-22 ENCOUNTER — Other Ambulatory Visit: Payer: Self-pay | Admitting: Gastroenterology

## 2021-02-22 MED ORDER — PANTOPRAZOLE SODIUM 40 MG PO TBEC: 40.0000 mg | DELAYED_RELEASE_TABLET | Freq: Two times a day (BID) | ORAL | 3 refills | Status: DC

## 2021-02-24 ENCOUNTER — Other Ambulatory Visit: Payer: Self-pay | Admitting: Gastroenterology

## 2021-02-25 NOTE — Telephone Encounter (Signed)
[facility] scheduled for 03/03/21 at 10:00am, arrive at 9:45am. NPO after midnight prior to test except sips of water. No stomach medication morning of test.   MyChart message sent to pt to inform her of [facility] appt.

## 2021-03-02 DIAGNOSIS — J449 Chronic obstructive pulmonary disease, unspecified: Secondary | ICD-10-CM | POA: Diagnosis not present

## 2021-03-03 ENCOUNTER — Other Ambulatory Visit: Payer: Self-pay

## 2021-03-03 ENCOUNTER — Encounter (HOSPITAL_COMMUNITY)
Admission: RE | Admit: 2021-03-03 | Discharge: 2021-03-03 | Disposition: A | Discharge status: Home / Self Care | Payer: Medicaid Other | Source: Ambulatory Visit | Attending: Gastroenterology | Admitting: Gastroenterology

## 2021-03-03 DIAGNOSIS — R112 Nausea with vomiting, unspecified: Secondary | ICD-10-CM | POA: Diagnosis not present

## 2021-03-03 MED ORDER — TECHNETIUM TC 99M SULFUR COLLOID
2.0000 | Freq: Once | INTRAVENOUS | Status: AC | PRN
  Administered 2021-03-03: 2.2 via ORAL

## 2021-03-04 ENCOUNTER — Ambulatory Visit
Admission: RE | Admit: 2021-03-04 | Discharge: 2021-03-04 | Disposition: A | Discharge status: Home / Self Care | Payer: Medicaid Other | Source: Ambulatory Visit | Attending: Physical Medicine & Rehabilitation | Admitting: Physical Medicine & Rehabilitation

## 2021-03-04 DIAGNOSIS — M5410 Radiculopathy, site unspecified: Secondary | ICD-10-CM

## 2021-03-06 DIAGNOSIS — M7502 Adhesive capsulitis of left shoulder: Secondary | ICD-10-CM

## 2021-03-09 NOTE — Telephone Encounter (Signed)
Pt agrees with moving forward with MRI, orders placed.

## 2021-03-16 ENCOUNTER — Other Ambulatory Visit: Payer: Self-pay | Admitting: Gastroenterology

## 2021-03-16 MED ORDER — ESOMEPRAZOLE MAGNESIUM 40 MG PO CPDR: 40.0000 mg | DELAYED_RELEASE_CAPSULE | Freq: Two times a day (BID) | ORAL | 1 refills | Status: DC

## 2021-03-16 NOTE — Progress Notes (Signed)
RX for nexium bid sent

## 2021-04-02 DIAGNOSIS — H25811 Combined forms of age-related cataract, right eye: Secondary | ICD-10-CM | POA: Diagnosis not present

## 2021-04-03 ENCOUNTER — Other Ambulatory Visit: Payer: Self-pay

## 2021-04-03 ENCOUNTER — Ambulatory Visit
Admission: RE | Admit: 2021-04-03 | Discharge: 2021-04-03 | Disposition: A | Discharge status: Home / Self Care | Payer: Medicaid Other | Source: Ambulatory Visit | Attending: Orthopedic Surgery | Admitting: Orthopedic Surgery

## 2021-04-03 DIAGNOSIS — M7502 Adhesive capsulitis of left shoulder: Secondary | ICD-10-CM

## 2021-04-23 ENCOUNTER — Ambulatory Visit: Payer: Medicaid Other | Admitting: Orthopedic Surgery

## 2021-04-23 ENCOUNTER — Encounter: Payer: Self-pay | Admitting: Orthopedic Surgery

## 2021-04-23 ENCOUNTER — Other Ambulatory Visit: Payer: Self-pay

## 2021-04-23 DIAGNOSIS — M7582 Other shoulder lesions, left shoulder: Secondary | ICD-10-CM

## 2021-04-23 NOTE — Progress Notes (Signed)
New Patient Visit  Assessment: Sherry Glass is a 50 y.o. RHD female with the following: Left rotator cuff tendinitis  Plan: Persistent left shoulder pain.  No acute injuries.  She states the pain is a little bit improved.  She continues to do exercises for the left shoulder.  Reviewed the MRI with her in clinic today, which is without acute injuries.  No tearing of the rotator cuff tendons.  Discussed multiple treatment options, including a referral for a second opinion, formal physical therapy or continuing with her current course.  She would like to continue with exercises on her own, and we specifically discussed adding the sleeper stretch to potentially stretch out the posterior capsule.  She stated her understanding.  She will let me know if she is having any further issues.  Follow-up as needed.  Follow-up: Return if symptoms worsen or fail to improve.  Subjective:  Chief Complaint  Patient presents with   Results    MRI results LT shoulder    History of Present Illness: Sherry Glass is a 50 y.o. female who returns to clinic today for repeat evaluation of her left shoulder.  She has had persistent left shoulder pain, with atraumatic onset.  She had a reaction to the subacromial steroid injection, and has thus been reluctant to proceed with an intra-articular injection.  She has obtained an MRI of the left shoulder, and is here to discuss the results.  She continues to do exercises on her own, and notes some improvement of the pain.  Pain is primarily located in the posterior and lateral aspect of the shoulder.  Review of Systems: No fevers or chills No numbness or tingling No chest pain No shortness of breath No bowel or bladder dysfunction No GI distress No headaches  Objective: There were no vitals taken for this visit.  Physical Exam:  General: Alert and oriented. and No acute distress. Gait: Normal gait.  Evaluation of left shoulder demonstrates no deformity.  No  atrophy is appreciated.  Active forward flexion to 120 degrees with obvious discomfort.  Abduction to 85 degrees.  Mild restriction of internal rotation in the 90 degree abducted position.   5/5 infraspinatus strength.  Negative belly press.  4/5 supraspinatus strength due to pain.  Fingers are warm and well-perfused.  2+ radial pulse.  IMAGING: I personally ordered and reviewed the following images  Left shoulder MRI  IMPRESSION: 1. Mild tendinosis of the supraspinatus and infraspinatus tendons without a tear.   New Medications:  No orders of the defined types were placed in this encounter.     Oliver Barre, MD  04/23/2021 11:41 PM

## 2021-04-23 NOTE — Patient Instructions (Signed)
Add the sleeper stretch - can review on YouTube  Continue other exercises  If minimal improvement, consider formal physical therapy  Call if you have questions

## 2021-05-02 HISTORY — PX: CATARACT EXTRACTION: SUR2

## 2021-05-12 NOTE — Telephone Encounter (Signed)
Will close encounter

## 2021-05-13 ENCOUNTER — Encounter: Payer: Self-pay | Admitting: Orthopedic Surgery

## 2021-05-13 ENCOUNTER — Encounter: Payer: Self-pay | Admitting: Family

## 2021-05-13 ENCOUNTER — Ambulatory Visit: Payer: Medicaid Other | Admitting: Family

## 2021-05-13 VITALS — BP 123/72 | HR 73 | Temp 97.8°F | Ht 64.0 in | Wt 207.0 lb

## 2021-05-13 DIAGNOSIS — K219 Gastro-esophageal reflux disease without esophagitis: Secondary | ICD-10-CM | POA: Diagnosis not present

## 2021-05-13 DIAGNOSIS — F411 Generalized anxiety disorder: Secondary | ICD-10-CM | POA: Diagnosis not present

## 2021-05-13 DIAGNOSIS — E1159 Type 2 diabetes mellitus with other circulatory complications: Secondary | ICD-10-CM

## 2021-05-13 DIAGNOSIS — E890 Postprocedural hypothyroidism: Secondary | ICD-10-CM | POA: Diagnosis not present

## 2021-05-13 DIAGNOSIS — E785 Hyperlipidemia, unspecified: Secondary | ICD-10-CM | POA: Diagnosis not present

## 2021-05-13 DIAGNOSIS — E1169 Type 2 diabetes mellitus with other specified complication: Secondary | ICD-10-CM | POA: Diagnosis not present

## 2021-05-13 DIAGNOSIS — F320 Major depressive disorder, single episode, mild: Secondary | ICD-10-CM

## 2021-05-13 DIAGNOSIS — J984 Other disorders of lung: Secondary | ICD-10-CM | POA: Diagnosis not present

## 2021-05-13 DIAGNOSIS — E1165 Type 2 diabetes mellitus with hyperglycemia: Secondary | ICD-10-CM

## 2021-05-13 DIAGNOSIS — M778 Other enthesopathies, not elsewhere classified: Secondary | ICD-10-CM

## 2021-05-13 DIAGNOSIS — I152 Hypertension secondary to endocrine disorders: Secondary | ICD-10-CM | POA: Diagnosis not present

## 2021-05-13 DIAGNOSIS — J441 Chronic obstructive pulmonary disease with (acute) exacerbation: Secondary | ICD-10-CM

## 2021-05-13 LAB — CMP14+EGFR
ALT: 22 IU/L (ref 0–32)
AST: 16 IU/L (ref 0–40)
Albumin/Globulin Ratio: 2 (ref 1.2–2.2)
Albumin: 4.5 g/dL (ref 3.8–4.9)
Alkaline Phosphatase: 89 IU/L (ref 44–121)
BUN/Creatinine Ratio: 13 (ref 9–23)
BUN: 9 mg/dL (ref 6–24)
Bilirubin Total: 0.8 mg/dL (ref 0.0–1.2)
CO2: 24 mmol/L (ref 20–29)
Calcium: 9.5 mg/dL (ref 8.7–10.2)
Chloride: 101 mmol/L (ref 96–106)
Creatinine, Ser: 0.7 mg/dL (ref 0.57–1.00)
Globulin, Total: 2.2 g/dL (ref 1.5–4.5)
Glucose: 230 mg/dL — ABNORMAL HIGH (ref 70–99)
Potassium: 4.2 mmol/L (ref 3.5–5.2)
Sodium: 143 mmol/L (ref 134–144)
Total Protein: 6.7 g/dL (ref 6.0–8.5)
eGFR: 104 mL/min/{1.73_m2} (ref >59)

## 2021-05-13 LAB — CBC WITH DIFFERENTIAL/PLATELET
Basophils Absolute: 0.1 10*3/uL (ref 0.0–0.2)
Basos: 1 %
EOS (ABSOLUTE): 0.5 10*3/uL — ABNORMAL HIGH (ref 0.0–0.4)
Eos: 5 %
Hematocrit: 42.4 % (ref 34.0–46.6)
Hemoglobin: 14.1 g/dL (ref 11.1–15.9)
Immature Grans (Abs): 0 10*3/uL (ref 0.0–0.1)
Immature Granulocytes: 0 %
Lymphocytes Absolute: 3.7 10*3/uL — ABNORMAL HIGH (ref 0.7–3.1)
Lymphs: 39 %
MCH: 31.6 pg (ref 26.6–33.0)
MCHC: 33.3 g/dL (ref 31.5–35.7)
MCV: 95 fL (ref 79–97)
Monocytes Absolute: 0.6 10*3/uL (ref 0.1–0.9)
Monocytes: 6 %
Neutrophils Absolute: 4.6 10*3/uL (ref 1.4–7.0)
Neutrophils: 49 %
Platelets: 287 10*3/uL (ref 150–450)
RBC: 4.46 x10E6/uL (ref 3.77–5.28)
RDW: 12.8 % (ref 11.7–15.4)
WBC: 9.4 10*3/uL (ref 3.4–10.8)

## 2021-05-13 LAB — BAYER DCA HB A1C WAIVED: HB A1C (BAYER DCA - WAIVED): 6.6 % — ABNORMAL HIGH (ref 4.8–5.6)

## 2021-05-13 MED ORDER — LOSARTAN POTASSIUM 100 MG PO TABS: 100.0000 mg | ORAL_TABLET | Freq: Every day | ORAL | 1 refills | Status: DC

## 2021-05-13 MED ORDER — GABAPENTIN 300 MG PO CAPS: 300.0000 mg | ORAL_CAPSULE | Freq: Three times a day (TID) | ORAL | 2 refills | Status: DC

## 2021-05-13 MED ORDER — ESCITALOPRAM OXALATE 20 MG PO TABS: 20.0000 mg | ORAL_TABLET | Freq: Every day | ORAL | 5 refills | Status: DC

## 2021-05-13 MED ORDER — DICLOFENAC SODIUM 75 MG PO TBEC: 75.0000 mg | DELAYED_RELEASE_TABLET | Freq: Two times a day (BID) | ORAL | 0 refills | Status: DC

## 2021-05-13 MED ORDER — TRULICITY 1.5 MG/0.5ML ~~LOC~~ SOAJ: 1.5000 mg | SUBCUTANEOUS | 2 refills | Status: DC

## 2021-05-13 MED ORDER — METFORMIN HCL 1000 MG PO TABS: 1000.0000 mg | ORAL_TABLET | Freq: Two times a day (BID) | ORAL | 1 refills | Status: DC

## 2021-05-13 MED ORDER — ROSUVASTATIN CALCIUM 10 MG PO TABS: 10.0000 mg | ORAL_TABLET | Freq: Every day | ORAL | 3 refills | Status: DC

## 2021-05-13 MED ORDER — GLIMEPIRIDE 2 MG PO TABS: 2.0000 mg | ORAL_TABLET | Freq: Every day | ORAL | 1 refills | Status: DC

## 2021-05-13 MED ORDER — LEVOTHYROXINE SODIUM 100 MCG PO TABS: 100.0000 ug | ORAL_TABLET | Freq: Every day | ORAL | 3 refills | Status: DC

## 2021-05-13 NOTE — Progress Notes (Signed)
Subjective:    Patient ID: Sherry Glass, female    DOB: 06-26-1970, 51 y.o.   MRN: [id number]  Pt presents to the office today for chronic follow up. Pt has seen Pulmonologist for COPD, Restrictive lung disease, and SOB, but has been cleared.  Followed by Endocrinologists yearly for hypothyroidism. She had thyroid ablation.   She is followed by [facility] every 6 months for chronic back pain.   She is morbid obese with a BMI of 35 and DM and HTN.  Hypertension This is a chronic problem. The current episode started more than 1 year ago. The problem has been resolved since onset. The problem is controlled. Associated symptoms include malaise/fatigue and shortness of breath. Pertinent negatives include no blurred vision or peripheral edema. Risk factors for coronary artery disease include dyslipidemia, diabetes mellitus, obesity, sedentary lifestyle and smoking/tobacco exposure. The current treatment provides moderate improvement. Identifiable causes of hypertension include a thyroid problem.  Diabetes She presents for her follow-up diabetic visit. She has type 2 diabetes mellitus. Hypoglycemia symptoms include nervousness/anxiousness. Associated symptoms include fatigue. Pertinent negatives for diabetes include no blurred vision and no foot paresthesias. Symptoms are stable. Pertinent negatives for diabetic complications include no heart disease or peripheral neuropathy. Risk factors for coronary artery disease include dyslipidemia, diabetes mellitus, hypertension, sedentary lifestyle and post-menopausal. She is following a generally unhealthy diet. Her overall blood glucose range is 110-130 mg/dl. Eye exam is current.  Gastroesophageal Reflux She complains of belching and heartburn. This is a chronic problem. The current episode started more than 1 year ago. The problem occurs occasionally. The problem has been waxing and waning. Associated symptoms include fatigue. Risk factors include obesity.  She has tried a PPI for the symptoms. The treatment provided moderate relief.  Thyroid Problem Presents for follow-up visit. Symptoms include anxiety, depressed mood and fatigue. The symptoms have been stable. Her past medical history is significant for hyperlipidemia.  Hyperlipidemia This is a chronic problem. The current episode started more than 1 year ago. The problem is controlled. Exacerbating diseases include obesity. Associated symptoms include shortness of breath. Current antihyperlipidemic treatment includes statins. The current treatment provides moderate improvement of lipids. Risk factors for coronary artery disease include dyslipidemia, diabetes mellitus, hypertension, a sedentary lifestyle and post-menopausal.  Anxiety Presents for follow-up visit. Symptoms include depressed mood, excessive worry, irritability, nervous/anxious behavior, restlessness and shortness of breath. Symptoms occur most days. The severity of symptoms is moderate.    Depression        This is a chronic problem.  The current episode started more than 1 year ago.   The onset quality is gradual.   The problem occurs intermittently.  Associated symptoms include fatigue, helplessness, hopelessness, irritable, restlessness and sad.  Past treatments include SSRIs - Selective serotonin reuptake inhibitors.  Compliance with treatment is good.  Past medical history includes thyroid problem.   Shoulder Pain  The pain is present in the left shoulder. This is a recurrent problem. The current episode started more than 1 month ago (8 months). The quality of the pain is described as aching. The pain is at a severity of 8/10. The pain is moderate. Associated symptoms include a limited range of motion. The symptoms are aggravated by activity. She has tried rest, OTC ointments, movement and acetaminophen for the symptoms. The treatment provided mild relief.     Review of Systems  Constitutional:  Positive for fatigue, irritability  and malaise/fatigue.  Eyes:  Negative for blurred vision.  Respiratory:  for shortness of breath.   °Gastrointestinal:  Positive for heartburn.  °Psychiatric/Behavioral:  Positive for depression. The patient is nervous/anxious.   °All other systems reviewed and are negative. ° °   °Objective:  ° Physical Exam °Vitals reviewed.  °Constitutional:   °   General: She is irritable. She is not in acute distress. °   Appearance: She is well-developed. She is obese.  °HENT:  °   Head: Normocephalic and atraumatic.  °   Right Ear: Tympanic membrane normal.  °   Left Ear: Tympanic membrane normal.  °Eyes:  °   Pupils: Pupils are equal, round, and reactive to light.  °Neck:  °   Thyroid: No thyromegaly.  °Cardiovascular:  °   Rate and Rhythm: Normal rate and regular rhythm.  °   Heart sounds: Normal heart sounds. No murmur heard. °Pulmonary:  °   Effort: Pulmonary effort is normal. No respiratory distress.  °   Breath sounds: Rhonchi present. No wheezing.  °Abdominal:  °   General: Bowel sounds are normal. There is no distension.  °   Palpations: Abdomen is soft.  °   Tenderness: There is no abdominal tenderness.  °Musculoskeletal:     °   General: Tenderness present.  °   Cervical back: Normal range of motion and neck supple.  °   Comments: Pain in left shoulder with flexion and extension  °Skin: °   General: Skin is warm and dry.  °Neurological:  °   Mental Status: She is alert and oriented to person, place, and time.  °   Cranial Nerves: No cranial nerve deficit.  °   Deep Tendon Reflexes: Reflexes are normal and symmetric.  °Psychiatric:     °   Behavior: Behavior normal.     °   Thought Content: Thought content normal.     °   Judgment: Judgment normal.  ° ° ° ° °BP 123/72    Pulse 73    Temp 97.8 °F (36.6 °C) (Temporal)    Ht 5' 4" (1.626 m)    Wt 207 lb (93.9 kg)    BMI 35.53 kg/m²  ° °   °Assessment & Plan:  °Sherry Glass comes in today with chief complaint of Medical Management of Chronic  Issues ° ° °Diagnosis and orders addressed: ° °1. Depression, major, single episode, mild (HCC) °- escitalopram (LEXAPRO) 20 MG tablet; Take 1 tablet (20 mg total) by mouth daily.  Dispense: 30 tablet; Refill: 5 °- CMP14+EGFR °- CBC with Differential/Platelet ° °2. GAD (generalized anxiety disorder) °- escitalopram (LEXAPRO) 20 MG tablet; Take 1 tablet (20 mg total) by mouth daily.  Dispense: 30 tablet; Refill: 5 °- CMP14+EGFR °- CBC with Differential/Platelet ° °3. Hyperlipidemia associated with type 2 diabetes mellitus (HCC) °- rosuvastatin (CRESTOR) 10 MG tablet; Take 1 tablet (10 mg total) by mouth daily.  Dispense: 90 tablet; Refill: 3 °- CMP14+EGFR °- CBC with Differential/Platelet ° °4. Hypertension associated with diabetes (HCC) °- losartan (COZAAR) 100 MG tablet; Take 1 tablet (100 mg total) by mouth daily.  Dispense: 90 tablet; Refill: 1 °- CMP14+EGFR °- CBC with Differential/Platelet ° °5. Left shoulder tendinitis °- diclofenac (VOLTAREN) 75 MG EC tablet; Take 1 tablet (75 mg total) by mouth 2 (two) times daily.  Dispense: 30 tablet; Refill: 0 °- CMP14+EGFR °- CBC with Differential/Platelet ° °6. Uncontrolled type 2 diabetes mellitus with hyperglycemia (HCC) °- metFORMIN (GLUCOPHAGE) 1000 MG tablet; Take 1 tablet (1,000 mg total) by mouth 2 (two)   times daily with a meal. (NEEDS TO BE SEEN BEFORE NEXT REFILL)  Dispense: 180 tablet; Refill: 1 °- Dulaglutide (TRULICITY) 1.5 MG/0.5ML SOPN; Inject 1.5 mg into the skin every Saturday.  Dispense: 6 mL; Refill: 2 °- glimepiride (AMARYL) 2 MG tablet; Take 1 tablet (2 mg total) by mouth daily with breakfast. (NEEDS TO BE SEEN BEFORE NEXT REFILL)  Dispense: 90 tablet; Refill: 1 °- CMP14+EGFR °- CBC with Differential/Platelet °- Bayer DCA Hb A1c Waived ° °7. Restrictive lung disease °- CMP14+EGFR °- CBC with Differential/Platelet ° °8. Chronic obstructive pulmonary disease with acute exacerbation (HCC) °- CMP14+EGFR °- CBC with Differential/Platelet ° °9.  Gastroesophageal reflux disease, unspecified whether esophagitis present °- CMP14+EGFR °- CBC with Differential/Platelet ° °10. Hypothyroidism following radioiodine therapy °- CMP14+EGFR °- CBC with Differential/Platelet ° °11. Morbid obesity (HCC) °- CMP14+EGFR °- CBC with Differential/Platelet ° ° °Labs pending °Health Maintenance reviewed °Diet and exercise encouraged ° °Follow up plan: ° ° 3 months  ° °Christy Hawks, [name] ° °  °

## 2021-05-13 NOTE — Patient Instructions (Signed)
Rotator Cuff Tendinitis Rotator cuff tendinitis is inflammation of the tendons in the rotator cuff. Tendons are tough, cord-like bands that connect muscle to bone. The rotator cuff includes all of the muscles and tendons that connect the arm to the shoulder. The rotator cuff holds the head of the humerus, or the upper arm bone, in the cup of the shoulder blade (scapula). This condition can lead to a long-term or chronic tear. The tear may be partial or complete. What are the causes? This condition is usually caused by overusing the rotator cuff. What increases the risk? This condition is more likely to develop in athletes and workers who frequently use their shoulder or reach over their heads. This can include activities such as: Tennis. Baseball or softball. Swimming. Construction work. Painting. What are the signs or symptoms? Symptoms of this condition include: Pain that spreads (radiates) from the shoulder to the upper arm. Swelling and tenderness in front of the shoulder. Pain when reaching, pulling, or lifting the arm above the head. Pain when lowering the arm from above the head. Minor pain in the shoulder when resting. Increased pain in the shoulder at night. Difficulty placing the arm behind the back. How is this diagnosed? This condition is diagnosed with a physical exam and medical history. Tests may also be done, including: X-rays. MRI. Ultrasound. CT with or without contrast. How is this treated? Treatment for this condition depends on the severity of the condition. In less severe cases, treatment may include: Rest. This may be done with a sling that holds the shoulder still (immobilization). Your health care provider may also recommend avoiding activities that involve lifting your arm over your head. Icing the shoulder. Anti-inflammatory medicines, such as aspirin or ibuprofen. In more severe cases, treatment may include: Physical therapy. Steroid  injections. Surgery. Follow these instructions at home: If you have a sling: Wear the sling as told by your health care provider. Remove it only as told by your health care provider. Loosen it if your fingers tingle, become numb, or turn cold and blue. Keep it clean. If the sling is not waterproof: Do not let it get wet. Cover it with a watertight covering when you take a bath or shower. Managing pain, stiffness, and swelling  If directed, put ice on the injured area. To do this: If you have a removable sling, remove it as told by your health care provider. Put ice in a plastic bag. Place a towel between your skin and the bag. Leave the ice on for 20 minutes, 2-3 times a day. Move your fingers often to reduce stiffness and swelling. Raise (elevate) the injured area above the level of your heart while you are lying down. Find a comfortable sleeping position, or sleep in a recliner, if available. Activity Rest your shoulder as told by your health care provider. Ask your health care provider when it is safe to drive if you have a sling on your arm. Return to your normal activities as told by your health care provider. Ask your health care provider what activities are safe for you. Do any exercises or stretches as told by your health care provider or physical therapist. If you do repetitive overhead tasks, take small breaks in between and include stretching exercises as told by your health care provider. General instructions Do not use any products that contain nicotine or tobacco, such as cigarettes, e-cigarettes, and chewing tobacco. These can delay healing. If you need help quitting, ask your health care provider. Take   over-the-counter and prescription medicines only as told by your health care provider. Keep all follow-up visits as told by your health care provider. This is important. Contact a health care provider if: Your pain gets worse. You have new pain in your arm, hands, or  fingers. Your pain is not relieved with medicine or does not get better after 6 weeks of treatment. You have crackling sensations when moving your shoulder in certain directions. You hear a snapping sound after using your shoulder, followed by severe pain and weakness. Get help right away if: Your arm, hand, or fingers are numb or tingling. Your arm, hand, or fingers are swollen or painful or they turn white or blue. Summary Rotator cuff tendinitis is inflammation of the tendons in the rotator cuff. Tendons are tough, cord-like bands that connect muscle to bone. This condition is usually caused by overusing the rotator cuff, which includes all of the muscles and tendons that connect the arm to the shoulder. This condition is more likely to develop in athletes and workers who frequently use their shoulder or reach over their heads. Treatment generally includes rest, anti-inflammatory medicines, and icing. In some cases, physical therapy and steroid injections may be needed. In severe cases, surgery may be needed. This information is not intended to replace advice given to you by your health care provider. Make sure you discuss any questions you have with your health care provider. Document Revised: 01/21/2019 Document Reviewed: 01/21/2019 Elsevier Patient Education  2022 Elsevier Inc.  

## 2021-05-24 ENCOUNTER — Other Ambulatory Visit: Payer: Self-pay | Admitting: Orthopedic Surgery

## 2021-05-24 DIAGNOSIS — M7582 Other shoulder lesions, left shoulder: Secondary | ICD-10-CM

## 2021-05-24 DIAGNOSIS — M7502 Adhesive capsulitis of left shoulder: Secondary | ICD-10-CM

## 2021-06-01 ENCOUNTER — Encounter: Payer: Self-pay | Admitting: Physical Therapy

## 2021-06-01 ENCOUNTER — Other Ambulatory Visit: Payer: Self-pay

## 2021-06-01 ENCOUNTER — Ambulatory Visit: Payer: Medicaid Other | Attending: Orthopedic Surgery | Admitting: Physical Therapy

## 2021-06-01 DIAGNOSIS — M7502 Adhesive capsulitis of left shoulder: Secondary | ICD-10-CM | POA: Diagnosis not present

## 2021-06-01 DIAGNOSIS — M7582 Other shoulder lesions, left shoulder: Secondary | ICD-10-CM | POA: Diagnosis not present

## 2021-06-01 DIAGNOSIS — M25512 Pain in left shoulder: Secondary | ICD-10-CM | POA: Diagnosis not present

## 2021-06-01 DIAGNOSIS — M25612 Stiffness of left shoulder, not elsewhere classified: Secondary | ICD-10-CM | POA: Insufficient documentation

## 2021-06-01 DIAGNOSIS — M6281 Muscle weakness (generalized): Secondary | ICD-10-CM | POA: Insufficient documentation

## 2021-06-01 DIAGNOSIS — G8929 Other chronic pain: Secondary | ICD-10-CM | POA: Diagnosis not present

## 2021-06-01 NOTE — Therapy (Signed)
[facility] [place], [place], [address] Phone: [phone]   Fax:  [phone]  Physical Therapy Evaluation  Patient Details  Name: Sherry Glass MRN: [id number] Date of Birth: 11/27/1970 Referring Provider (PT): Larena Glassman MD   Encounter Date: 06/01/2021   PT End of Session - 06/01/21 1159     Visit Number 1    Number of Visits 12    Date for PT Re-Evaluation 07/13/21    PT Start Time 0945    PT Stop Time 1012    PT Time Calculation (min) 27 min    Activity Tolerance Patient tolerated treatment well    Behavior During Therapy WFL for tasks assessed/performed             Past Medical History:  Diagnosis Date   Allergic rhinitis    Anxiety and depression    Cataract    Chest pain syndrome    COPD (chronic obstructive pulmonary disease) ([place])    Depression    Diabetes mellitus without complication ([place])    History of kidney stones    HTN (hypertension)    Hypercholesterolemia    Hyperglycemia    Hypertension    Hypothyroidism    Obesity    Restless leg syndrome    Tobacco abuse     Past Surgical History:  Procedure Laterality Date   ABDOMINAL HYSTERECTOMY     CARDIAC CATHETERIZATION  2014   CATARACT EXTRACTION W/PHACO Left 06/23/2016   Procedure: CATARACT EXTRACTION PHACO AND INTRAOCULAR LENS PLACEMENT ([place]);  Surgeon: Tonny Branch, MD;  Location: [facility];  Service: Ophthalmology;  Laterality: Left;  left - pt knows to arrive at 10:50 cde 9.11   CESAREAN SECTION     CHOLECYSTECTOMY     COLONOSCOPY WITH PROPOFOL N/A 10/14/2019   Procedure: COLONOSCOPY WITH PROPOFOL;  Surgeon: Daneil Dolin, MD;  Location: [facility];  Service: Endoscopy;  Laterality: N/A;  8:00   ESOPHAGOGASTRODUODENOSCOPY (EGD) WITH PROPOFOL N/A 02/01/2021   Procedure: ESOPHAGOGASTRODUODENOSCOPY (EGD) WITH PROPOFOL;  Surgeon: Daneil Dolin, MD;  Location: [facility];  Service: Endoscopy;  Laterality: N/A;  10:00am   EXCISION  MASS NECK     cyst   FOOT MASS EXCISION     left foot surgery     removal of cyst   NECK SURGERY     POLYPECTOMY  10/14/2019   Procedure: POLYPECTOMY;  Surgeon: Daneil Dolin, MD;  [facility]: [facility];  Service: Endoscopy;;   THYROID SURGERY     Dr. Arnoldo Morale   TONSILLECTOMY      There were no vitals filed for this visit.    Subjective Assessment - 06/01/21 1201     Subjective The patient presents to the clinci today with c/o progressively worsening left shoulder pain that came on for no apparent reason about 7 months ago.  An MRI revealed mild tendinosis of the supraspinatus and infraspinatus tendons without a tear.  Her pain is rated at a 6/10 today and rise to severe levels with movement and lying down.  She had an injection but it not provide much relief.  She has not found anything really decreases her pain.    Pertinent History gall baldder, mass removed from foot and neck, hysterectomy, cataract surgery right eye, HTN, DM, HTN, neck surgery.    Diagnostic tests MRI.    Currently in Pain? Yes    Pain Score 6     Pain Location Shoulder    Pain Orientation Left  Pain Descriptors / Indicators Aching;Throbbing    Pain Type Chronic pain    Pain Onset More than a month ago    Aggravating Factors  See above.    Pain Relieving Factors See above.                [facility] PT Assessment - 06/01/21 0001       Assessment   Medical Diagnosis Rotator cuff tendonitis, left.    Referring Provider (PT) Larena Glassman MD    Hand Dominance Left      Precautions   Precautions None      Restrictions   Weight Bearing Restrictions No      Home Environment   Living Environment Private residence      Posture/Postural Control   Posture/Postural Control Postural limitations    Postural Limitations Rounded Shoulders;Forward head      Deep Tendon Reflexes   DTR Assessment Site Biceps;Brachioradialis;Triceps    Biceps DTR 1+    Brachioradialis DTR 2+    Triceps DTR 1+      ROM /  Strength   AROM / PROM / Strength AROM;PROM      AROM   Overall AROM Comments Active left shoulder flexion to 95 degrees adn passive to 120 degrees, ER limited to 35 degrees and behind back motion limited to left hip.      Strength   Overall Strength Comments Left shoulder ER is 4-/5 limited at least in part due to pain.  Strength difficult to accurately assess due to pain.      Palpation   Palpation comment Very tender to palpation over patient's left posterior cuff region.      Special Tests   Other special tests (-) Drop Arm test, (+) Impingement test.                        Objective measurements completed on examination: See above findings.                  PT Short Term Goals - 10/09/19 0914       PT SHORT TERM GOAL #1   Title Patient will be independent with HEP    Baseline met 10/09/19    Time 3    Period Weeks    Status Achieved               PT Long Term Goals - 06/01/21 1219       PT LONG TERM GOAL #1   Title Independent with a HEP.    Baseline No knowledge of appropriate ther ex.    Time 6    Period Weeks    Status New      PT LONG TERM GOAL #2   Title Active left shoulder flexion to 145 degrees so the patient can easily reach overhead.    Baseline 95 degrees.    Time 6    Period Weeks    Status New      PT LONG TERM GOAL #3   Title Active ER to 70 degrees+ to allow for easily donning/doffing of apparel.    Baseline 35 degrees.    Time 6    Status New      PT LONG TERM GOAL #4   Title Increase ROM so patient is able to reach behind back to L4 with left hand.    Baseline To left hip.    Time 6    Period Weeks    Status  New      PT LONG TERM GOAL #5   Title Perform ADL's with pain not > 3/10.    Baseline Pain reaches 10/10 when performing ADL's.    Time 6    Period Weeks    Status New                    Plan - 06/01/21 1211     Clinical Impression Statement The patient presents to OPPT with c/o  left shoulder pain over the last 7 months that become very severe with movement and lying down.  She has significant losses of left shoulder range of motion.  She is very palpably tender in her left posterior cuff region and pain referral into her middle deltoid region.  She demonstrates a positive impingement test.Patient will benefit from skilled physical therapy intervention to address pain and deficits.    Personal Factors and Comorbidities Other    Comorbidities gall baldder, mass removed from foot and neck, hysterectomy, cataract surgery right eye, HTN, DM, HTN, neck surgery.    Examination-Activity Limitations Other    Stability/Clinical Decision Making Evolving/Moderate complexity    Clinical Decision Making Low    Rehab Potential Good    PT Frequency 2x / week    PT Duration 6 weeks    PT Treatment/Interventions ADLs/Self Care Home Management;Therapeutic activities;Therapeutic exercise;Manual techniques;Patient/family education;Passive range of motion    PT Next Visit Plan Left shoulder PROM, STW/M, begin with seated UE Ranger, wall ladder with eventual progression into PRE's.    Consulted and Agree with Plan of Care Patient             Patient will benefit from skilled therapeutic intervention in order to improve the following deficits and impairments:  Decreased activity tolerance, Postural dysfunction, Pain, Increased muscle spasms, Decreased range of motion, Decreased strength  Visit Diagnosis: Chronic left shoulder pain - Plan: PT plan of care cert/re-cert  Stiffness of left shoulder, not elsewhere classified - Plan: PT plan of care cert/re-cert  Muscle weakness (generalized) - Plan: PT plan of care cert/re-cert     Problem List Patient Active Problem List   Diagnosis Date Noted   Abdominal pain, epigastric 01/08/2021   N&V (nausea and vomiting) 01/08/2021   Loose stools 06/04/2019   Hepatic steatosis 05/23/2019   Psoriasis 05/09/2019   Depression, major, single  episode, mild ([place]) 10/12/2018   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   Nocturnal hypoxemia 05/29/2017   GAD (generalized anxiety disorder) 02/13/2017   Restrictive lung disease 01/27/2017   Lung nodule < 6cm on CT 01/27/2017   GERD (gastroesophageal reflux disease) 11/18/2016   Snoring 11/18/2016   Witnessed episode of apnea 11/18/2016   Chronic seasonal allergic rhinitis 11/18/2016   Restless leg syndrome 11/18/2016   Fibromyalgia 11/18/2016   Hyperlipidemia associated with type 2 diabetes mellitus ([place]) 06/09/2016   Vitamin D deficiency 05/16/2016   Hyperthyroidism 05/16/2016   Morbid obesity ([place]) 05/13/2016   Uncontrolled type 2 diabetes mellitus with hyperglycemia ([place]) 05/13/2016   COPD (chronic obstructive pulmonary disease) ([place]) 05/13/2016   Chest pain syndrome    Hypertension associated with diabetes ([place])    Stopped smoking with greater than 25 pack year history     Lupie Sawa, [place], PT 06/01/2021, 12:23 PM  [facility] [address] [place], [place], [address] Phone: [phone]   Fax:  [phone]  Name: Sherry Glass MRN: [id number] Date of Birth: [birth year]

## 2021-06-07 NOTE — Addendum Note (Signed)
Addended by: Erial Fikes, [place] W on: 06/07/2021 09:24 AM   Modules accepted: Orders

## 2021-06-14 ENCOUNTER — Encounter: Payer: Self-pay | Admitting: Physical Therapy

## 2021-06-14 ENCOUNTER — Ambulatory Visit: Payer: Medicaid Other | Attending: Orthopedic Surgery | Admitting: Physical Therapy

## 2021-06-14 ENCOUNTER — Other Ambulatory Visit: Payer: Self-pay

## 2021-06-14 DIAGNOSIS — G8929 Other chronic pain: Secondary | ICD-10-CM | POA: Insufficient documentation

## 2021-06-14 DIAGNOSIS — M25512 Pain in left shoulder: Secondary | ICD-10-CM | POA: Diagnosis not present

## 2021-06-14 DIAGNOSIS — M6281 Muscle weakness (generalized): Secondary | ICD-10-CM | POA: Insufficient documentation

## 2021-06-14 DIAGNOSIS — M25612 Stiffness of left shoulder, not elsewhere classified: Secondary | ICD-10-CM | POA: Diagnosis not present

## 2021-06-14 NOTE — Therapy (Signed)
[facility] [address] [place], [place], [address] Phone: [phone]   Fax:  [phone]  Physical Therapy Treatment  Patient Details  Name: Sherry Glass MRN: [id number] Date of Birth: [birth year] Referring Provider (PT): Thane Edu MD   Encounter Date: 06/14/2021   PT End of Session - 06/14/21 0903     Visit Number 2    Number of Visits 12    Date for PT Re-Evaluation 07/13/21    PT Start Time 0902    PT Stop Time 0944    PT Time Calculation (min) 42 min    Activity Tolerance Patient tolerated treatment well    Behavior During Therapy [facility] for tasks assessed/performed             Past Medical History:  Diagnosis Date   Allergic rhinitis    Anxiety and depression    Cataract    Chest pain syndrome    COPD (chronic obstructive pulmonary disease) ([facility])    Depression    Diabetes mellitus without complication ([facility])    History of kidney stones    HTN (hypertension)    Hypercholesterolemia    Hyperglycemia    Hypertension    Hypothyroidism    Obesity    Restless leg syndrome    Tobacco abuse     Past Surgical History:  Procedure Laterality Date   ABDOMINAL HYSTERECTOMY     CARDIAC CATHETERIZATION  2014   CATARACT EXTRACTION W/PHACO Left 06/23/2016   Procedure: CATARACT EXTRACTION PHACO AND INTRAOCULAR LENS PLACEMENT ([facility]);  Surgeon: Gemma Payor, MD;  Location: [facility];  Service: Ophthalmology;  Laterality: Left;  left - pt knows to arrive at 10:50 cde 9.11   CESAREAN SECTION     CHOLECYSTECTOMY     COLONOSCOPY WITH PROPOFOL N/A 10/14/2019   Procedure: COLONOSCOPY WITH PROPOFOL;  Surgeon: Corbin Ade, MD;  [facility];  Service: Endoscopy;  Laterality: N/A;  8:00   ESOPHAGOGASTRODUODENOSCOPY (EGD) WITH PROPOFOL N/A 02/01/2021   Procedure: ESOPHAGOGASTRODUODENOSCOPY (EGD) WITH PROPOFOL;  Surgeon: Corbin Ade, MD;  [facility];  Service: Endoscopy;  Laterality: N/A;  10:00am   EXCISION MASS  NECK     cyst   FOOT MASS EXCISION     left foot surgery     removal of cyst   NECK SURGERY     POLYPECTOMY  10/14/2019   Procedure: POLYPECTOMY;  Surgeon: Corbin Ade, MD;  [facility];  Service: Endoscopy;;   THYROID SURGERY     Dr. Lovell Sheehan   TONSILLECTOMY      There were no vitals filed for this visit.   Subjective Assessment - 06/14/21 0901     Subjective Reports pain this morning.    Pertinent History gall baldder, mass removed from foot and neck, hysterectomy, cataract surgery right eye, HTN, DM, HTN, neck surgery.    Diagnostic tests MRI.    Currently in Pain? Yes    Pain Score 5     Pain Location Shoulder    Pain Orientation Left    Pain Descriptors / Indicators Discomfort    Pain Type Chronic pain    Pain Onset More than a month ago    Pain Frequency Constant                [facility] PT Assessment - 06/14/21 0001       Assessment   Medical Diagnosis Rotator cuff tendonitis, left.    Referring Provider (PT) Thane Edu MD  Hand Dominance Left      Precautions   Precautions None      Restrictions   Weight Bearing Restrictions No                           [facility] Adult PT Treatment/Exercise - 06/14/21 0001       Exercises   Exercises Shoulder      Shoulder Exercises: Standing   Other Standing Exercises Wall ladder LUE x5 reps (max at 20)      Shoulder Exercises: Pulleys   Flexion 5 minutes      Shoulder Exercises: ROM/Strengthening   UBE (Upper Arm Bike) 120 RPM x6 min (forward/backward)    Ranger seated; flex/ext x3 min, CW and CCW circles x3 min each    Ball on Wall into flex x15 reps      Manual Therapy   Manual Therapy Passive ROM    Passive ROM PROM of L shoulder into flex, ER/IR with gentle holds at end range                          PT Long Term Goals - 06/01/21 1219       PT LONG TERM GOAL #1   Title Independent with a HEP.    Baseline No knowledge of appropriate ther ex.    Time  6    Period Weeks    Status New      PT LONG TERM GOAL #2   Title Active left shoulder flexion to 145 degrees so the patient can easily reach overhead.    Baseline 95 degrees.    Time 6    Period Weeks    Status New      PT LONG TERM GOAL #3   Title Active ER to 70 degrees+ to allow for easily donning/doffing of apparel.    Baseline 35 degrees.    Time 6    Status New      PT LONG TERM GOAL #4   Title Increase ROM so patient is able to reach behind back to L4 with left hand.    Baseline To left hip.    Time 6    Period Weeks    Status New      PT LONG TERM GOAL #5   Title Perform ADL's with pain not > 3/10.    Baseline Pain reaches 10/10 when performing ADL's.    Time 6    Period Weeks    Status New                   Plan - 06/14/21 1024     Clinical Impression Statement Patient presented in clinic with mod L shoulder pain. Patient observed with facial grimacing and pain intermittantly throughout therex session with focus on assisted ROM activities. Slow and controlled motions were instructed for reps to avoid pain exacerbations. Firm end feels and smooth arc of motion noted during PROM but intermittant facial grimacing especially with end range flexion.    Personal Factors and Comorbidities Other    Comorbidities gall baldder, mass removed from foot and neck, hysterectomy, cataract surgery right eye, HTN, DM, HTN, neck surgery.    Examination-Activity Limitations Other    Stability/Clinical Decision Making Evolving/Moderate complexity    Rehab Potential Good    PT Frequency 2x / week    PT Duration 6 weeks    PT Treatment/Interventions ADLs/Self Care Home Management;Therapeutic activities;Therapeutic  exercise;Manual techniques;Patient/family education;Passive range of motion    PT Next Visit Plan Left shoulder PROM, STW/M, begin with seated UE Ranger, wall ladder with eventual progression into PRE's.    Consulted and Agree with Plan of Care Patient              Patient will benefit from skilled therapeutic intervention in order to improve the following deficits and impairments:  Decreased activity tolerance, Postural dysfunction, Pain, Increased muscle spasms, Decreased range of motion, Decreased strength  Visit Diagnosis: Chronic left shoulder pain  Stiffness of left shoulder, not elsewhere classified  Muscle weakness (generalized)     Problem List Patient Active Problem List   Diagnosis Date Noted   Abdominal pain, epigastric 01/08/2021   N&V (nausea and vomiting) 01/08/2021   Loose stools 06/04/2019   Hepatic steatosis 05/23/2019   Psoriasis 05/09/2019   Depression, major, single episode, mild ([facility]) 10/12/2018   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   Nocturnal hypoxemia 05/29/2017   GAD (generalized anxiety disorder) 02/13/2017   Restrictive lung disease 01/27/2017   Lung nodule < 6cm on CT 01/27/2017   GERD (gastroesophageal reflux disease) 11/18/2016   Snoring 11/18/2016   Witnessed episode of apnea 11/18/2016   Chronic seasonal allergic rhinitis 11/18/2016   Restless leg syndrome 11/18/2016   Fibromyalgia 11/18/2016   Hyperlipidemia associated with type 2 diabetes mellitus ([facility]) 06/09/2016   Vitamin D deficiency 05/16/2016   Hyperthyroidism 05/16/2016   Morbid obesity ([facility]) 05/13/2016   Uncontrolled type 2 diabetes mellitus with hyperglycemia ([facility]) 05/13/2016   COPD (chronic obstructive pulmonary disease) ([facility]) 05/13/2016   Chest pain syndrome    Hypertension associated with diabetes ([facility])    Stopped smoking with greater than 25 pack year history     Marvell Fuller, PTA 06/14/2021, 10:48 AM  [facility] [address] [place], [place], [address] Phone: [phone]   Fax:  [phone]  Name: Sherry Glass MRN: [id number] Date of Birth: 17-Feb-1971

## 2021-06-17 ENCOUNTER — Ambulatory Visit: Payer: Medicaid Other | Admitting: Physical Therapy

## 2021-06-17 ENCOUNTER — Encounter: Payer: Self-pay | Admitting: Physical Therapy

## 2021-06-17 ENCOUNTER — Other Ambulatory Visit: Payer: Self-pay

## 2021-06-17 DIAGNOSIS — M25612 Stiffness of left shoulder, not elsewhere classified: Secondary | ICD-10-CM

## 2021-06-17 DIAGNOSIS — M25512 Pain in left shoulder: Secondary | ICD-10-CM | POA: Diagnosis not present

## 2021-06-17 DIAGNOSIS — G8929 Other chronic pain: Secondary | ICD-10-CM

## 2021-06-17 DIAGNOSIS — M6281 Muscle weakness (generalized): Secondary | ICD-10-CM | POA: Diagnosis not present

## 2021-06-17 NOTE — Therapy (Signed)
[facility] [place], [place], [address] Phone: [phone]   Fax:  [phone]  Physical Therapy Treatment  Patient Details  Name: Sherry Glass MRN: [id number] Date of Birth: [birth year] Referring Provider (PT): Larena Glassman MD   Encounter Date: 06/17/2021   PT End of Session - 06/17/21 0903     Visit Number 3    Number of Visits 12    Date for PT Re-Evaluation 07/13/21    PT Start Time 0903    PT Stop Time 0944    PT Time Calculation (min) 41 min    Activity Tolerance Patient tolerated treatment well    Behavior During Therapy [facility] for tasks assessed/performed             Past Medical History:  Diagnosis Date   Allergic rhinitis    Anxiety and depression    Cataract    Chest pain syndrome    COPD (chronic obstructive pulmonary disease) ([place])    Depression    Diabetes mellitus without complication ([place])    History of kidney stones    HTN (hypertension)    Hypercholesterolemia    Hyperglycemia    Hypertension    Hypothyroidism    Obesity    Restless leg syndrome    Tobacco abuse     Past Surgical History:  Procedure Laterality Date   ABDOMINAL HYSTERECTOMY     CARDIAC CATHETERIZATION  2014   CATARACT EXTRACTION W/PHACO Left 06/23/2016   Procedure: CATARACT EXTRACTION PHACO AND INTRAOCULAR LENS PLACEMENT ([place]);  Surgeon: Tonny Branch, MD;  Location: [facility];  Service: Ophthalmology;  Laterality: Left;  left - pt knows to arrive at 10:50 cde 9.11   CESAREAN SECTION     CHOLECYSTECTOMY     COLONOSCOPY WITH PROPOFOL N/A 10/14/2019   Procedure: COLONOSCOPY WITH PROPOFOL;  Surgeon: Daneil Dolin, MD;  [facility]: [facility];  Service: Endoscopy;  Laterality: N/A;  8:00   ESOPHAGOGASTRODUODENOSCOPY (EGD) WITH PROPOFOL N/A 02/01/2021   Procedure: ESOPHAGOGASTRODUODENOSCOPY (EGD) WITH PROPOFOL;  Surgeon: Daneil Dolin, MD;  [facility];  Service: Endoscopy;  Laterality: N/A;  10:00am   EXCISION MASS  NECK     cyst   FOOT MASS EXCISION     left foot surgery     removal of cyst   NECK SURGERY     POLYPECTOMY  10/14/2019   Procedure: POLYPECTOMY;  Surgeon: Daneil Dolin, MD;  [facility];  Service: Endoscopy;;   THYROID SURGERY     Dr. Arnoldo Morale   TONSILLECTOMY      There were no vitals filed for this visit.   Subjective Assessment - 06/17/21 0902     Subjective Reports pain this morning. reports that she feels more pain with any activity of the L shoulder and feels a popping sensation sometimes.    Pertinent History gall baldder, mass removed from foot and neck, hysterectomy, cataract surgery right eye, HTN, DM, HTN, neck surgery.    Diagnostic tests MRI.    Currently in Pain? Yes    Pain Score 5     Pain Location Shoulder    Pain Orientation Left    Pain Descriptors / Indicators Discomfort    Pain Type Chronic pain    Pain Onset More than a month ago    Pain Frequency Constant                [facility] PT Assessment - 06/17/21 0001  Assessment   Medical Diagnosis Rotator cuff tendonitis, left.    Referring Provider (PT) Larena Glassman MD    Hand Dominance Left      Precautions   Precautions None                           [facility] Adult PT Treatment/Exercise - 06/17/21 0001       Shoulder Exercises: Standing   Other Standing Exercises Wall ladder LUE x5 reps (max at 20)      Shoulder Exercises: Pulleys   Flexion 5 minutes      Shoulder Exercises: ROM/Strengthening   UBE (Upper Arm Bike) 120 RPM x6 min (forward/backward)    Ranger seated; flex/ext x2 min, CW and CCW circles x2 min each    Wall Wash CW x10 reps, CCW x10 reps      Shoulder Exercises: Isometric Strengthening   Flexion 5X5"    Extension 5X5"    External Rotation 5X5"    Internal Rotation 5X5"      Manual Therapy   Manual Therapy Passive ROM    Passive ROM PROM of L shoulder into flex, abd, ER/IR with gentle holds at end range                           PT Long Term Goals - 06/01/21 1219       PT LONG TERM GOAL #1   Title Independent with a HEP.    Baseline No knowledge of appropriate ther ex.    Time 6    Period Weeks    Status New      PT LONG TERM GOAL #2   Title Active left shoulder flexion to 145 degrees so the patient can easily reach overhead.    Baseline 95 degrees.    Time 6    Period Weeks    Status New      PT LONG TERM GOAL #3   Title Active ER to 70 degrees+ to allow for easily donning/doffing of apparel.    Baseline 35 degrees.    Time 6    Status New      PT LONG TERM GOAL #4   Title Increase ROM so patient is able to reach behind back to L4 with left hand.    Baseline To left hip.    Time 6    Period Weeks    Status New      PT LONG TERM GOAL #5   Title Perform ADL's with pain not > 3/10.    Baseline Pain reaches 10/10 when performing ADL's.    Time 6    Period Weeks    Status New                   Plan - 06/17/21 1050     Clinical Impression Statement Patient continues to present with L shoulder pain reported with any activity or movement of the LUE. Patient progressed through more ROM and isometric strengthening with intermittant pain reported and facial grimacing. Patient indicated that most pain is located at posterior shoulder but popping along superior aspect. Firm end feels and smooth arc of motion noted during PROM but facial grimacing noted throughout PROM session. Patient states that she uses heat at home.    Personal Factors and Comorbidities Other    Comorbidities gall baldder, mass removed from foot and neck, hysterectomy, cataract surgery right eye, HTN, DM, HTN, neck  surgery.    Examination-Activity Limitations Other    Stability/Clinical Decision Making Evolving/Moderate complexity    Rehab Potential Good    PT Frequency 2x / week    PT Duration 6 weeks    PT Treatment/Interventions ADLs/Self Care Home Management;Therapeutic  activities;Therapeutic exercise;Manual techniques;Patient/family education;Passive range of motion    PT Next Visit Plan Left shoulder PROM, STW/M, begin with seated UE Ranger, wall ladder with eventual progression into PRE's.    Consulted and Agree with Plan of Care Patient             Patient will benefit from skilled therapeutic intervention in order to improve the following deficits and impairments:  Decreased activity tolerance, Postural dysfunction, Pain, Increased muscle spasms, Decreased range of motion, Decreased strength  Visit Diagnosis: Chronic left shoulder pain  Stiffness of left shoulder, not elsewhere classified  Muscle weakness (generalized)     Problem List Patient Active Problem List   Diagnosis Date Noted   Abdominal pain, epigastric 01/08/2021   N&V (nausea and vomiting) 01/08/2021   Loose stools 06/04/2019   Hepatic steatosis 05/23/2019   Psoriasis 05/09/2019   Depression, major, single episode, mild ([place]) 10/12/2018   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   Nocturnal hypoxemia 05/29/2017   GAD (generalized anxiety disorder) 02/13/2017   Restrictive lung disease 01/27/2017   Lung nodule < 6cm on CT 01/27/2017   GERD (gastroesophageal reflux disease) 11/18/2016   Snoring 11/18/2016   Witnessed episode of apnea 11/18/2016   Chronic seasonal allergic rhinitis 11/18/2016   Restless leg syndrome 11/18/2016   Fibromyalgia 11/18/2016   Hyperlipidemia associated with type 2 diabetes mellitus ([place]) 06/09/2016   Vitamin D deficiency 05/16/2016   Hyperthyroidism 05/16/2016   Morbid obesity ([place]) 05/13/2016   Uncontrolled type 2 diabetes mellitus with hyperglycemia ([place]) 05/13/2016   COPD (chronic obstructive pulmonary disease) ([place]) 05/13/2016   Chest pain syndrome    Hypertension associated with diabetes ([place])    Stopped smoking with greater than 25 pack year history     Standley Brooking, PTA 06/17/2021, 10:55 AM  [facility] [place], [place], [address] Phone: [phone]   Fax:  [phone]  Name: KALASIA ESTIME MRN: [id number] Date of Birth: [birth year]

## 2021-06-18 ENCOUNTER — Ambulatory Visit: Payer: Medicaid Other | Admitting: Gastroenterology

## 2021-06-18 IMAGING — CT CT CHEST W/O CM
2 of 4 series · 13 of 36 positions shown, 16 images · non-contrast
Comparison: November 27, 2017

CLINICAL DATA: Follow-up right upper lobe lung nodule.

EXAM:
CT CHEST WITHOUT CONTRAST
TECHNIQUE: Multidetector CT imaging of the chest was performed following the
standard protocol without IV contrast.

[Series 2: chest 2.00 br40 s3 · axial · 0.75mm/px · z∈[+1595,+1875]mm · 10 of 166 slices shown, 13 images (1 of 2)]
[im 13/166  mediastinal]
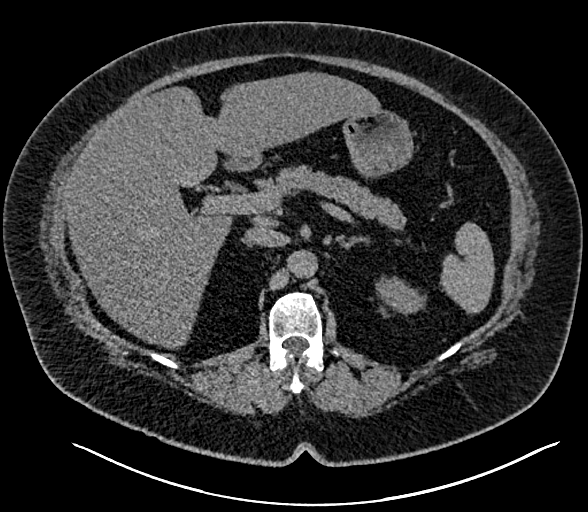
[im 13/166  lung]
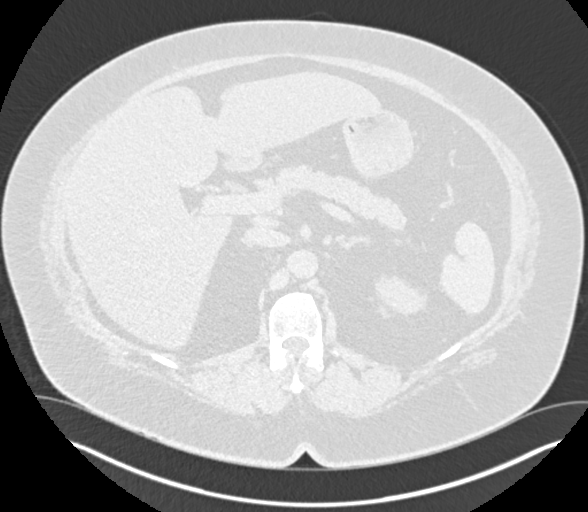
[im 26/166  lung]
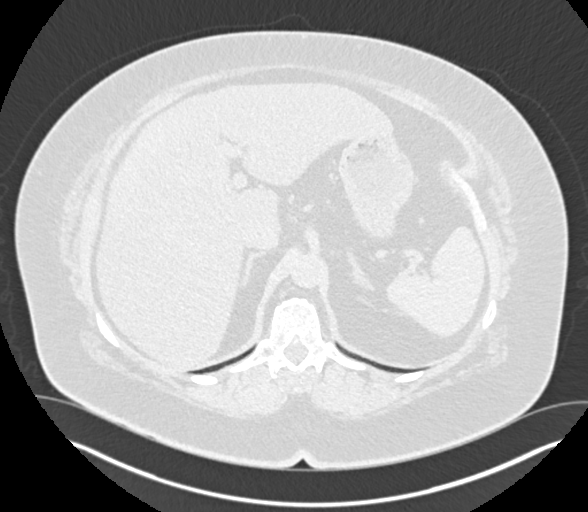
[im 51/166  lung]
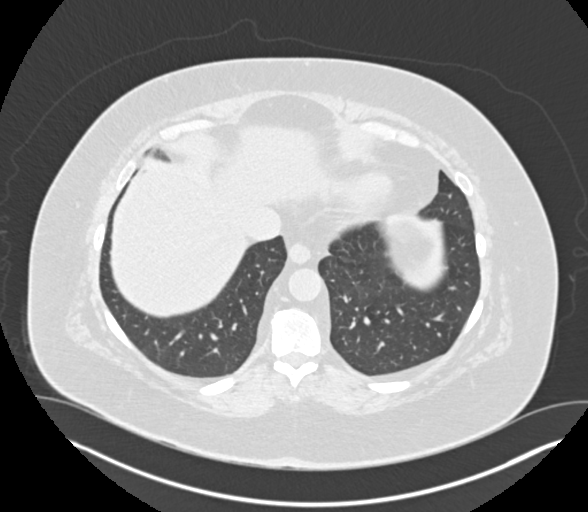
[im 64/166  lung]
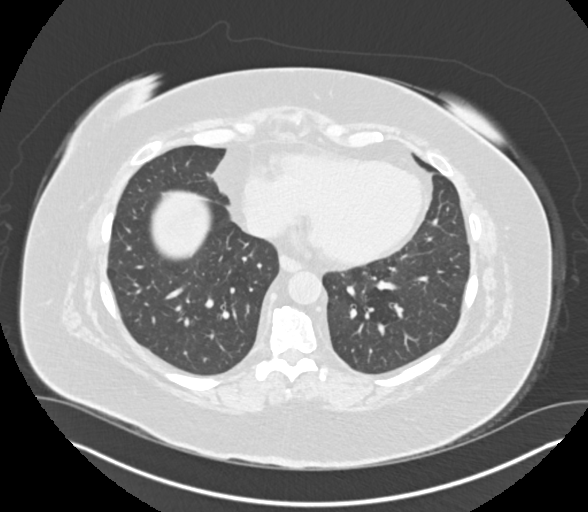
[im 77/166  mediastinal]
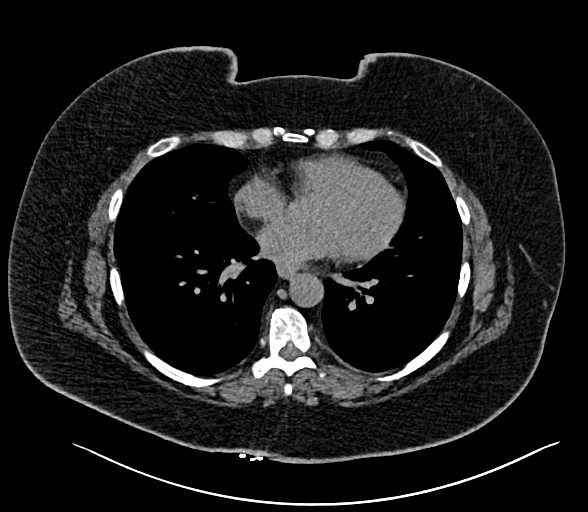
[im 77/166  lung]
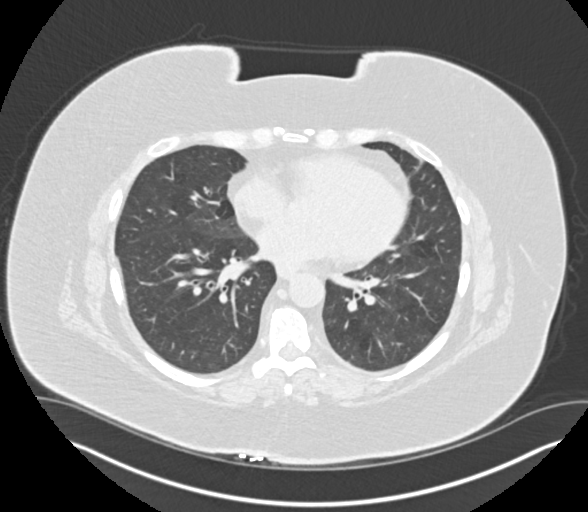
[im 89/166  lung]
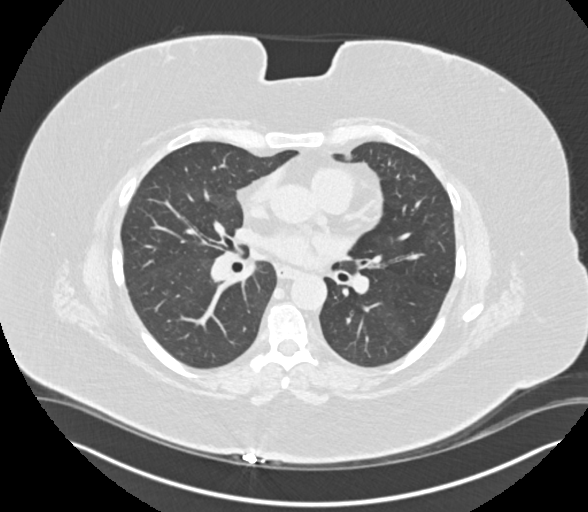
[im 102/166  lung]
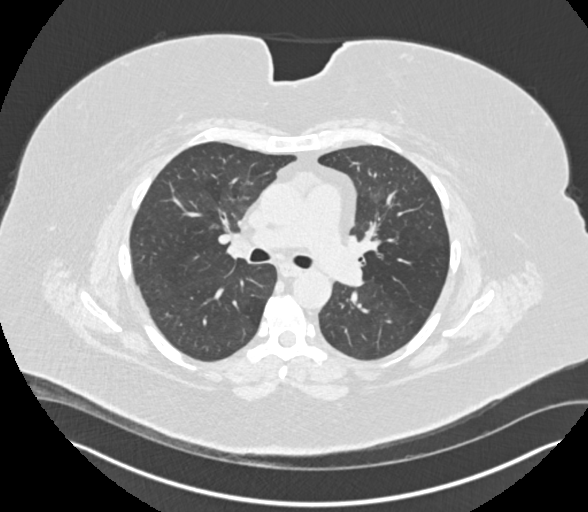
[im 127/166  lung]
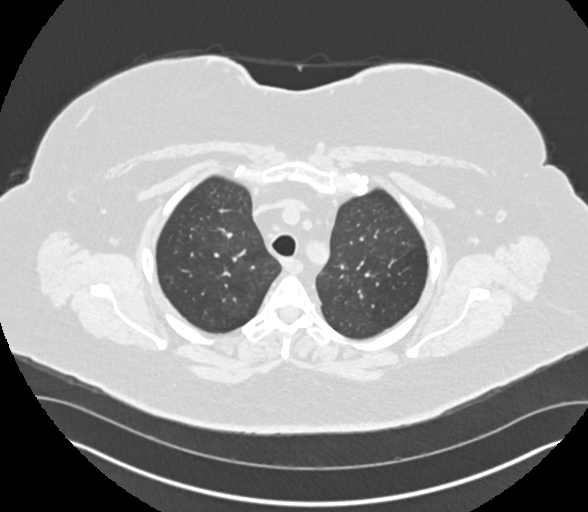
[im 140/166  mediastinal]
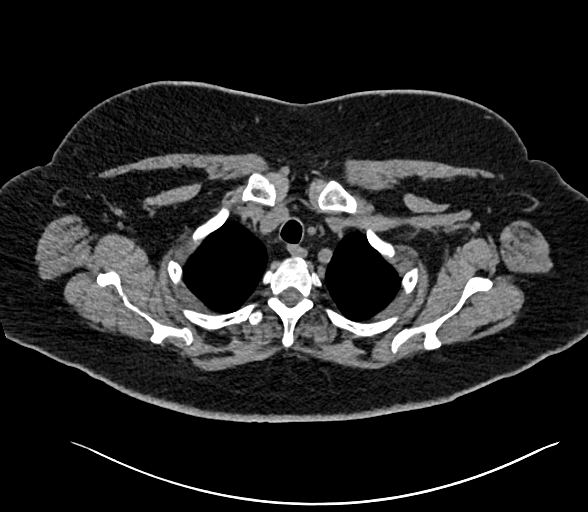
[im 140/166  lung]
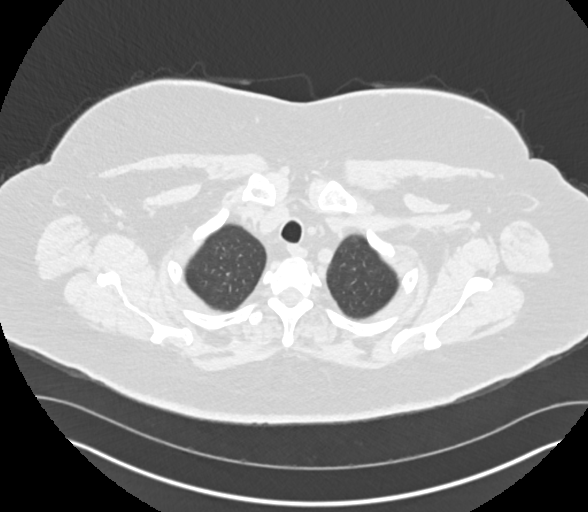
[im 153/166  lung]
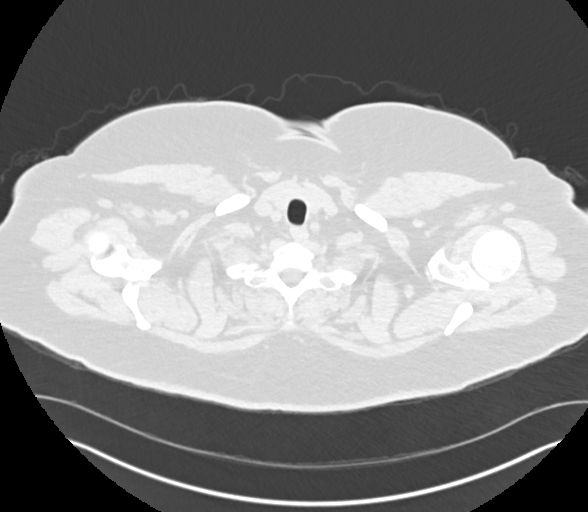

[Series 4: chest 2.00 br40 s3 · coronal · 0.65mm/px · 3 of 192 slices shown (2 of 2)]
[im 39/192  lung]
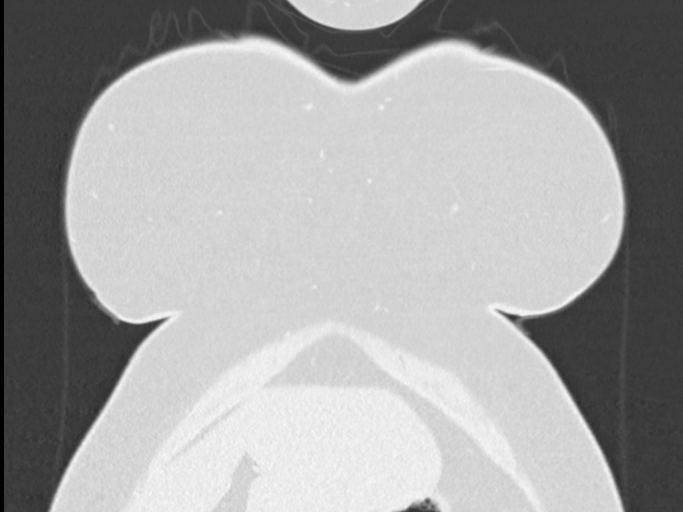
[im 77/192  lung]
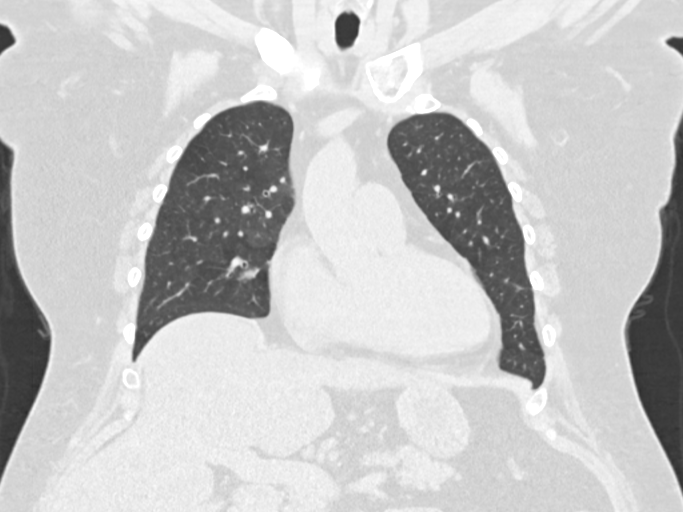
[im 115/192  lung]
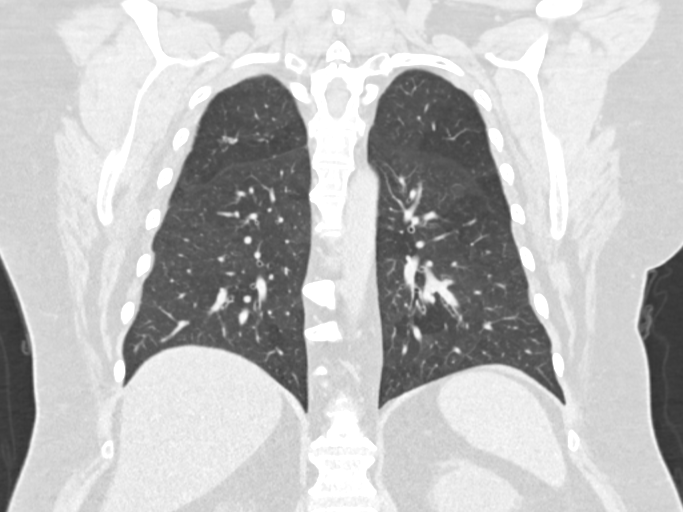

[13 of 36 positions shown; findings below may reference images not displayed]

FINDINGS: Cardiovascular: No significant vascular findings. Normal heart size.
No pericardial effusion.

Mediastinum/Nodes: No enlarged mediastinal or axillary lymph nodes.
Thyroid gland, trachea, and esophagus demonstrate no significant
findings.

Lungs/Pleura: A small area of focal scarring and/or atelectasis is
seen within the posterior aspect of the right upper lobe (axial CT
images 45 through 47, CT series number 8). This area is slightly
nodular in appearance on the prior study. These nodular appearing
areas are not clearly identified on the current exam.

There is no evidence of acute infiltrate, pleural effusion or
pneumothorax.

Upper Abdomen: There is diffuse fatty infiltration of the liver
parenchyma.

Multiple surgical clips are seen within the gallbladder fossa.

A 1.2 cm low-attenuation left adrenal mass is noted.

Musculoskeletal: Degenerative changes seen within the lower thoracic
spine.
IMPRESSION: 1. Small area of focal scarring and/or atelectasis within the
posterior aspect of the right upper lobe. This area is slightly
nodular in appearance on the prior study. These nodular appearing
areas are not clearly identified on the current exam. Correlation
with follow-up chest CT is recommended to determine stability.
2. Fatty liver.
3. Evidence of prior cholecystectomy.
4. 1.2 cm low-attenuation left adrenal mass which may represent an
adrenal adenoma.

## 2021-06-22 ENCOUNTER — Ambulatory Visit: Payer: Medicaid Other

## 2021-06-22 ENCOUNTER — Other Ambulatory Visit: Payer: Self-pay

## 2021-06-22 DIAGNOSIS — G8929 Other chronic pain: Secondary | ICD-10-CM

## 2021-06-22 DIAGNOSIS — M25612 Stiffness of left shoulder, not elsewhere classified: Secondary | ICD-10-CM | POA: Diagnosis not present

## 2021-06-22 DIAGNOSIS — M6281 Muscle weakness (generalized): Secondary | ICD-10-CM | POA: Diagnosis not present

## 2021-06-22 DIAGNOSIS — M25512 Pain in left shoulder: Secondary | ICD-10-CM | POA: Diagnosis not present

## 2021-06-22 NOTE — Therapy (Addendum)
[facility] [address] [place], [place], [address] Phone: [phone]   Fax:  [phone]  Physical Therapy Treatment  Patient Details  Name: Sherry Glass MRN: [id number] Date of Birth: [birth year] Referring Provider (PT): Thane Edu MD   Encounter Date: 06/22/2021   PT End of Session - 06/22/21 0904     Visit Number 4    Number of Visits 12    Date for PT Re-Evaluation 07/13/21    PT Start Time 0900    PT Stop Time 0943    PT Time Calculation (min) 43 min    Activity Tolerance Patient tolerated treatment well    Behavior During Therapy [facility] for tasks assessed/performed             Past Medical History:  Diagnosis Date   Allergic rhinitis    Anxiety and depression    Cataract    Chest pain syndrome    COPD (chronic obstructive pulmonary disease) ([facility])    Depression    Diabetes mellitus without complication ([facility])    History of kidney stones    HTN (hypertension)    Hypercholesterolemia    Hyperglycemia    Hypertension    Hypothyroidism    Obesity    Restless leg syndrome    Tobacco abuse     Past Surgical History:  Procedure Laterality Date   ABDOMINAL HYSTERECTOMY     CARDIAC CATHETERIZATION  2014   CATARACT EXTRACTION W/PHACO Left 06/23/2016   Procedure: CATARACT EXTRACTION PHACO AND INTRAOCULAR LENS PLACEMENT ([facility]);  Surgeon: Gemma Payor, MD;  Location: [facility];  Service: Ophthalmology;  Laterality: Left;  left - pt knows to arrive at 10:50 cde 9.11   CESAREAN SECTION     CHOLECYSTECTOMY     COLONOSCOPY WITH PROPOFOL N/A 10/14/2019   Procedure: COLONOSCOPY WITH PROPOFOL;  Surgeon: Corbin Ade, MD;  [facility];  Service: Endoscopy;  Laterality: N/A;  8:00   ESOPHAGOGASTRODUODENOSCOPY (EGD) WITH PROPOFOL N/A 02/01/2021   Procedure: ESOPHAGOGASTRODUODENOSCOPY (EGD) WITH PROPOFOL;  Surgeon: Corbin Ade, MD;  [facility];  Service: Endoscopy;  Laterality: N/A;  10:00am   EXCISION MASS  NECK     cyst   FOOT MASS EXCISION     left foot surgery     removal of cyst   NECK SURGERY     POLYPECTOMY  10/14/2019   Procedure: POLYPECTOMY;  Surgeon: Corbin Ade, MD;  [facility];  Service: Endoscopy;;   THYROID SURGERY     Dr. Lovell Sheehan   TONSILLECTOMY      There were no vitals filed for this visit.   Subjective Assessment - 06/22/21 0904     Subjective Patient reports that her shoulder is hurting today.    Pertinent History gall baldder, mass removed from foot and neck, hysterectomy, cataract surgery right eye, HTN, DM, HTN, neck surgery.    Diagnostic tests MRI.    Currently in Pain? Yes    Pain Score 6     Pain Location Shoulder    Pain Orientation Left    Pain Onset More than a month ago                               [facility] Adult PT Treatment/Exercise - 06/22/21 0001       Shoulder Exercises: Standing   Extension Both;20 reps    Extension Weight (lbs) [employer]  Both;20 reps    Row Weight (lbs) Blue XTS      Shoulder Exercises: Pulleys   Flexion 5 minutes      Shoulder Exercises: ROM/Strengthening   UBE (Upper Arm Bike) 120 RPM x8 min (forward/backward)    Ranger seated: flexion/extension with horizontal ADD and ABD      Shoulder Exercises: Isometric Strengthening   Flexion Other (comment)   2x10; 5 second hold   Extension Other (comment)   2x10; 5 second hold   Internal Rotation Other (comment)   2x10; 5 second hold   ABduction Other (comment)   2x10; 5 second   ADduction Other (comment)   2x10; 5 second hold     Shoulder Exercises: Stretch   Other Shoulder Stretches Upper trapezius   for L UT; 4x30"                         PT Long Term Goals - 06/01/21 1219       PT LONG TERM GOAL #1   Title Independent with a HEP.    Baseline No knowledge of appropriate ther ex.    Time 6    Period Weeks    Status New      PT LONG TERM GOAL #2   Title Active left shoulder flexion to 145 degrees so  the patient can easily reach overhead.    Baseline 95 degrees.    Time 6    Period Weeks    Status New      PT LONG TERM GOAL #3   Title Active ER to 70 degrees+ to allow for easily donning/doffing of apparel.    Baseline 35 degrees.    Time 6    Status New      PT LONG TERM GOAL #4   Title Increase ROM so patient is able to reach behind back to L4 with left hand.    Baseline To left hip.    Time 6    Period Weeks    Status New      PT LONG TERM GOAL #5   Title Perform ADL's with pain not > 3/10.    Baseline Pain reaches 10/10 when performing ADL's.    Time 6    Period Weeks    Status New                   Plan - 06/22/21 2595     Clinical Impression Statement Patient presented to treatment with no significant change in her familiar symptoms since her last appointment. Treatment focused on isometrics in addition to other familair interventions. She reported no significant increase in her familair symptoms with any of today's interventions. She required minimal cuing with isometrics to facilitate proper force production for rotator cuff engagement. She was unable to describe how she felt upon the conclusion of treatment. She continues to require skilled physical therapy to address her remaining impairments to maximize her functional mobiltiy.    Personal Factors and Comorbidities Other    Comorbidities gall baldder, mass removed from foot and neck, hysterectomy, cataract surgery right eye, HTN, DM, HTN, neck surgery.    Examination-Activity Limitations Other    Stability/Clinical Decision Making Evolving/Moderate complexity    Rehab Potential Good    PT Frequency 2x / week    PT Duration 6 weeks    PT Treatment/Interventions ADLs/Self Care Home Management;Therapeutic activities;Therapeutic exercise;Manual techniques;Patient/family education;Passive range of motion    PT Next Visit Plan Left shoulder  PROM, STW/M, begin with seated UE Ranger, wall ladder with eventual  progression into PRE's.    Consulted and Agree with Plan of Care Patient             Patient will benefit from skilled therapeutic intervention in order to improve the following deficits and impairments:  Decreased activity tolerance, Postural dysfunction, Pain, Increased muscle spasms, Decreased range of motion, Decreased strength  Visit Diagnosis: Chronic left shoulder pain  Stiffness of left shoulder, not elsewhere classified  Muscle weakness (generalized)     Problem List Patient Active Problem List   Diagnosis Date Noted   Abdominal pain, epigastric 01/08/2021   N&V (nausea and vomiting) 01/08/2021   Loose stools 06/04/2019   Hepatic steatosis 05/23/2019   Psoriasis 05/09/2019   Depression, major, single episode, mild ([facility]) 10/12/2018   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   Nocturnal hypoxemia 05/29/2017   GAD (generalized anxiety disorder) 02/13/2017   Restrictive lung disease 01/27/2017   Lung nodule < 6cm on CT 01/27/2017   GERD (gastroesophageal reflux disease) 11/18/2016   Snoring 11/18/2016   Witnessed episode of apnea 11/18/2016   Chronic seasonal allergic rhinitis 11/18/2016   Restless leg syndrome 11/18/2016   Fibromyalgia 11/18/2016   Hyperlipidemia associated with type 2 diabetes mellitus ([facility]) 06/09/2016   Vitamin D deficiency 05/16/2016   Hyperthyroidism 05/16/2016   Morbid obesity ([facility]) 05/13/2016   Uncontrolled type 2 diabetes mellitus with hyperglycemia ([facility]) 05/13/2016   COPD (chronic obstructive pulmonary disease) ([facility]) 05/13/2016   Chest pain syndrome    Hypertension associated with diabetes ([facility])    Stopped smoking with greater than 25 pack year history     Granville Lewis, PT 06/22/2021, 11:43 AM  [facility] [address] [place], [place], [address] Phone: [phone]   Fax:  [phone]  Name: Sherry Glass MRN: [id number] Date of Birth: [birth year]

## 2021-06-29 ENCOUNTER — Ambulatory Visit: Payer: Medicaid Other

## 2021-06-29 ENCOUNTER — Other Ambulatory Visit: Payer: Self-pay

## 2021-06-29 ENCOUNTER — Encounter: Payer: Self-pay | Admitting: Orthopedic Surgery

## 2021-06-29 DIAGNOSIS — M25612 Stiffness of left shoulder, not elsewhere classified: Secondary | ICD-10-CM

## 2021-06-29 DIAGNOSIS — M25512 Pain in left shoulder: Secondary | ICD-10-CM

## 2021-06-29 DIAGNOSIS — G8929 Other chronic pain: Secondary | ICD-10-CM | POA: Diagnosis not present

## 2021-06-29 DIAGNOSIS — M6281 Muscle weakness (generalized): Secondary | ICD-10-CM

## 2021-06-29 NOTE — Therapy (Addendum)
[facility] [place], [place], [address] Phone: [phone]   Fax:  [phone]  Physical Therapy Treatment  Patient Details  Name: Sherry Glass MRN: [id number] Date of Birth: 07-22-1970 Referring Provider (PT): Larena Glassman MD   Encounter Date: 06/29/2021   PT End of Session - 06/29/21 0917     Visit Number 5    Number of Visits 12    Date for PT Re-Evaluation 07/13/21    PT Start Time 0900    PT Stop Time 0912    PT Time Calculation (min) 12 min    Activity Tolerance Patient tolerated treatment well    Behavior During Therapy WFL for tasks assessed/performed             Past Medical History:  Diagnosis Date   Allergic rhinitis    Anxiety and depression    Cataract    Chest pain syndrome    COPD (chronic obstructive pulmonary disease) ([place])    Depression    Diabetes mellitus without complication ([place])    History of kidney stones    HTN (hypertension)    Hypercholesterolemia    Hyperglycemia    Hypertension    Hypothyroidism    Obesity    Restless leg syndrome    Tobacco abuse     Past Surgical History:  Procedure Laterality Date   ABDOMINAL HYSTERECTOMY     CARDIAC CATHETERIZATION  2014   CATARACT EXTRACTION W/PHACO Left 06/23/2016   Procedure: CATARACT EXTRACTION PHACO AND INTRAOCULAR LENS PLACEMENT ([place]);  Surgeon: Tonny Branch, MD;  Location: [facility];  Service: Ophthalmology;  Laterality: Left;  left - pt knows to arrive at 10:50 cde 9.11   CESAREAN SECTION     CHOLECYSTECTOMY     COLONOSCOPY WITH PROPOFOL N/A 10/14/2019   Procedure: COLONOSCOPY WITH PROPOFOL;  Surgeon: Daneil Dolin, MD;  [facility]: [facility];  Service: Endoscopy;  Laterality: N/A;  8:00   ESOPHAGOGASTRODUODENOSCOPY (EGD) WITH PROPOFOL N/A 02/01/2021   Procedure: ESOPHAGOGASTRODUODENOSCOPY (EGD) WITH PROPOFOL;  Surgeon: Daneil Dolin, MD;  [facility]: [facility];  Service: Endoscopy;  Laterality: N/A;  10:00am   EXCISION MASS  NECK     cyst   FOOT MASS EXCISION     left foot surgery     removal of cyst   NECK SURGERY     POLYPECTOMY  10/14/2019   Procedure: POLYPECTOMY;  Surgeon: Daneil Dolin, MD;  [facility]: [facility];  Service: Endoscopy;;   THYROID SURGERY     Dr. Arnoldo Morale   TONSILLECTOMY      There were no vitals filed for this visit.   Subjective Assessment - 06/29/21 0903     Subjective Patient reports that her shoulder is hurting today. She notes that it kept her up last night. She has tried doing her exercises at home, but her shoulder is not getting better. She is unable to recall any of the activities that she performed yesterday that may have aggravated her shoulder.    Pertinent History gall baldder, mass removed from foot and neck, hysterectomy, cataract surgery right eye, HTN, DM, HTN, neck surgery.    Diagnostic tests MRI.    Currently in Pain? Yes    Pain Score 8     Pain Location Shoulder    Pain Orientation Left    Pain Onset More than a month ago  [facility] Adult PT Treatment/Exercise - 06/29/21 0001       Shoulder Exercises: ROM/Strengthening   UBE (Upper Arm Bike) 120 RPM x 5 minutes                          PT Long Term Goals - 06/01/21 1219       PT LONG TERM GOAL #1   Title Independent with a HEP.    Baseline No knowledge of appropriate ther ex.    Time 6    Period Weeks    Status New      PT LONG TERM GOAL #2   Title Active left shoulder flexion to 145 degrees so the patient can easily reach overhead.    Baseline 95 degrees.    Time 6    Period Weeks    Status New      PT LONG TERM GOAL #3   Title Active ER to 70 degrees+ to allow for easily donning/doffing of apparel.    Baseline 35 degrees.    Time 6    Status New      PT LONG TERM GOAL #4   Title Increase ROM so patient is able to reach behind back to L4 with left hand.    Baseline To left hip.    Time 6    Period Weeks    Status New       PT LONG TERM GOAL #5   Title Perform ADL's with pain not > 3/10.    Baseline Pain reaches 10/10 when performing ADL's.    Time 6    Period Weeks    Status New                   Plan - 06/29/21 6568     Clinical Impression Statement Patient presented to treatment reporting increased left shoulder pain. She reported that "therapy is not helping." She declinded to continue with today's treatment due to her left shoulder pain. She was educated on contacting her referring physician to schedule a follow up appointment. She then left at that time. She would benefit from continued physical therapy to address her remaining impairments to maximize her functional mobility.    Personal Factors and Comorbidities Other    Comorbidities gall baldder, mass removed from foot and neck, hysterectomy, cataract surgery right eye, HTN, DM, HTN, neck surgery.    Examination-Activity Limitations Other    Stability/Clinical Decision Making Evolving/Moderate complexity    Rehab Potential Good    PT Frequency 2x / week    PT Duration 6 weeks    PT Treatment/Interventions ADLs/Self Care Home Management;Therapeutic activities;Therapeutic exercise;Manual techniques;Patient/family education;Passive range of motion    PT Next Visit Plan Left shoulder PROM, STW/M, begin with seated UE Ranger, wall ladder with eventual progression into PRE's.    Consulted and Agree with Plan of Care Patient             Patient will benefit from skilled therapeutic intervention in order to improve the following deficits and impairments:  Decreased activity tolerance, Postural dysfunction, Pain, Increased muscle spasms, Decreased range of motion, Decreased strength  Visit Diagnosis: Chronic left shoulder pain  Stiffness of left shoulder, not elsewhere classified  Muscle weakness (generalized)     Problem List Patient Active Problem List   Diagnosis Date Noted   Abdominal pain, epigastric 01/08/2021   N&V  (nausea and vomiting) 01/08/2021   Loose stools 06/04/2019   Hepatic steatosis 05/23/2019  Psoriasis 05/09/2019   Depression, major, single episode, mild ([place]) 10/12/2018   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   Nocturnal hypoxemia 05/29/2017   GAD (generalized anxiety disorder) 02/13/2017   Restrictive lung disease 01/27/2017   Lung nodule < 6cm on CT 01/27/2017   GERD (gastroesophageal reflux disease) 11/18/2016   Snoring 11/18/2016   Witnessed episode of apnea 11/18/2016   Chronic seasonal allergic rhinitis 11/18/2016   Restless leg syndrome 11/18/2016   Fibromyalgia 11/18/2016   Hyperlipidemia associated with type 2 diabetes mellitus ([place]) 06/09/2016   Vitamin D deficiency 05/16/2016   Hyperthyroidism 05/16/2016   Morbid obesity ([place]) 05/13/2016   Uncontrolled type 2 diabetes mellitus with hyperglycemia ([place]) 05/13/2016   COPD (chronic obstructive pulmonary disease) ([place]) 05/13/2016   Chest pain syndrome    Hypertension associated with diabetes ([place])    Stopped smoking with greater than 25 pack year history     Darlin Coco, PT 06/29/2021, 9:24 AM  [facility] [place], [place], [address] Phone: [phone]   Fax:  [phone]  Name: Sherry Glass MRN: [id number] Date of Birth: [birth year]  PHYSICAL THERAPY DISCHARGE SUMMARY  Visits from Start of Care: 5.  Current functional level related to goals / functional outcomes: See above.   Remaining deficits: See below.   Education / Equipment: HEP.   Patient agrees to discharge. Patient goals were not met. Patient is being discharged due to not returning since the last visit.    Mali Applegate MPT

## 2021-07-07 ENCOUNTER — Ambulatory Visit: Payer: Medicaid Other | Admitting: Orthopedic Surgery

## 2021-07-07 ENCOUNTER — Encounter: Payer: Self-pay | Admitting: Orthopedic Surgery

## 2021-07-07 ENCOUNTER — Other Ambulatory Visit: Payer: Self-pay

## 2021-07-07 VITALS — Ht 64.0 in | Wt 207.0 lb

## 2021-07-07 DIAGNOSIS — M7582 Other shoulder lesions, left shoulder: Secondary | ICD-10-CM

## 2021-07-07 DIAGNOSIS — M7502 Adhesive capsulitis of left shoulder: Secondary | ICD-10-CM

## 2021-07-07 NOTE — Progress Notes (Signed)
New Patient Visit ? ?Assessment: ?Sherry Glass is a 51 y.o. RHD female with the following: ?Chronic left shoulder pain; left rotator cuff tendinitis versus adhesive capsulitis ? ?Plan: ?Sherry Glass returned to clinic today with progressively worsening pain in her left shoulder.  We have discussed all of her previous treatments, which include medications, subacromial steroid injection, home exercises and more recently occupational therapy.  She specifically stated that the occupational therapy is worsening her pain.  The pain is constant.  She has restricted motion.  We reviewed radiographs, as well as the MRI in clinic today.  Radiographs without obvious pathology.  MRI is only positive for some mild tendinosis.  There is no evidence of a thickened capsule, consistent with adhesive capsulitis.  Because she has had a reaction to the subacromial steroid injection, she is reluctant to proceed with another injection.  It is possible that the lidocaine or the ethyl chloride was the culprit for the reaction, but she is reluctant nonetheless.  At this point, I still think that she has adhesive capsulitis, but due to the limited improvements with all other treatment options, I am reluctant to take her to the operating room even for a manipulation under anesthesia.  This was discussed with the patient in clinic today.  Furthermore, I am reluctant to proceed with a left shoulder arthroscopy due to the lack of findings on the MRI. ? ?After discussing all this in great detail with the patient and family member in clinic today, I would like for her to see Dr. August Saucer in [place] for a second opinion.  Pending the results of this visit, I am happy to see her at any time in clinic.  If she does wish to proceed to the operating room for manipulation under anesthesia, I am happy to discuss this in more detail, and schedule this procedure when she is ready. ? ?Follow-up: ?No follow-ups on file. ? ?Subjective: ? ?Chief Complaint   ?Patient presents with  ? Shoulder Pain  ?  Lt shoulder pain feels like it's getting worse with OT.  ? ? ?History of Present Illness: ?Sherry Glass is a 51 y.o. female who returns to clinic today for repeat evaluation of her left shoulder.  She is understandably frustrated with her course.  She has atraumatic onset of left shoulder pain.  She has limited range of motion.  She favors her left arm.  She has limited use of the left arm as a result.  Medications, injection and therapy have been unsuccessful in improving her symptoms.  She is at an increased risk for developing adhesive capsulitis that she has diabetes and hypothyroidism.  However, she is not interested in proceeding with a high-volume steroid injection, and the MRI is without typical findings of adhesive capsulitis.  She is asking whether a different type of MRI, including with contrast is reasonable at this point.  In addition, she is wondering if there is a surgery to burn a nerve around the shoulder, which could help her have some sustained relief. ? ? ?Review of Systems: ?No fevers or chills ?No numbness or tingling ?No chest pain ?No shortness of breath ?No bowel or bladder dysfunction ?No GI distress ?No headaches ? ?Objective: ?Ht 5\' 4"  (1.626 m)   Wt 207 lb (93.9 kg)   BMI 35.53 kg/m?  ? ?Physical Exam: ? ?General: Alert and oriented. and No acute distress. ?Gait: Normal gait. ? ?Left shoulder without deformity.  Passive forward flexion to 120 degrees, with obvious discomfort.  Active internal rotation to her back pocket with pain.  External rotation at her side is limited to 15-20 degrees, compared to 35 degrees on the contralateral side.  Fingers are warm and well-perfused.  2+ radial pulse.  Sensation is intact throughout the left upper extremity. ? ?IMAGING: ?I personally ordered and reviewed the following images ? ?Left shoulder MRI ? ?IMPRESSION: ?1. Mild tendinosis of the supraspinatus and infraspinatus tendons ?without a  tear. ? ? ?New Medications:  ?No orders of the defined types were placed in this encounter. ? ? ? ? ?Oliver Barre, MD ? ?07/07/2021 ?10:19 PM ? ?  ?

## 2021-07-12 ENCOUNTER — Ambulatory Visit: Payer: Medicaid Other | Admitting: Orthopedic Surgery

## 2021-07-12 ENCOUNTER — Other Ambulatory Visit: Payer: Self-pay

## 2021-07-12 DIAGNOSIS — M7502 Adhesive capsulitis of left shoulder: Secondary | ICD-10-CM

## 2021-07-13 ENCOUNTER — Encounter: Payer: Self-pay | Admitting: Orthopedic Surgery

## 2021-07-13 NOTE — Progress Notes (Signed)
? ?Office Visit Note ?  ?Patient: Sherry Glass           ?Date of Birth: 02-Sep-1970           ?MRN: [id number] ?Visit Date: 07/12/2021 ?Requested by: Oliver Barre, MD ?[phone] ?[place],  [place] [address] ?PCP: Junie Spencer, [name] ? ?Subjective: ?Chief Complaint  ?Patient presents with  ? Left Shoulder - Pain  ? ? ?HPI: Sherry Glass is a patient with left shoulder pain.  She reports insidious onset of pain 9 months ago.  The pain has become worse.  Denies any history of injury.  She is right-hand dominant.  Describes decreased range of motion.  The pain wakes her from sleep at night.  No prior surgery.  Ibuprofen is not helpful.  Patient does have COPD and she uses O2 at night only.  She tried physical therapy which made her symptoms worse she tried an injection which gave her an allergic reaction.  The pain is primarily anterior but it does work its way around the shoulder.  Pain radiates to the elbow and infrequently below the elbow.  Really hard for her to do anything with the arm.  She has had an MRI scan which is reviewed with the patient.  It does show some narrowing of the axillary recess but no rotator cuff tear no muscle atrophy in the biceps looks intact.  No significant arthritis in the [facility] joint or glenohumeral joint.  Patient does not want to do another injection. ?             ?ROS: All systems reviewed are negative as they relate to the chief complaint within the history of present illness.  Patient denies  fevers or chills. ? ? ?Assessment & Plan: ?Visit Diagnoses:  ?1. Adhesive capsulitis of left shoulder   ? ? ?Plan: Impression is refractory adhesive capsulitis in a patient with diabetes who has failed conservative measures.  She does have fairly restricted passive range of motion today.  Discussed with her operative and nonoperative options.  In general she would like to undergo an intervention which can help.  I did tell her that she has with surgery or nonoperative treatment still a lot of pain to  go through to improve.  Plan at this time is operative evaluation with manipulation under anesthesia and rotator interval release followed by home CPM machine for 6 hours at a minimum for the first week.  The risk and benefits of surgery are discussed with the patient including not limited to infection nerve vessel damage fracture as well as incomplete restoration of motion and function.  I did tell the patient that in people with diabetes this can be a very refractory problem.  Patient understands and wishes to proceed.  All questions answered ? ?Follow-Up Instructions: No follow-ups on file.  ? ?Orders:  ?No orders of the defined types were placed in this encounter. ? ?No orders of the defined types were placed in this encounter. ? ? ? ? Procedures: ?No procedures performed ? ? ?Clinical Data: ?No additional findings. ? ?Objective: ?Vital Signs: There were no vitals taken for this visit. ? ?Physical Exam:  ? ?Constitutional: Patient appears well-developed ?HEENT:  ?Head: Normocephalic ?Eyes:EOM are normal ?Neck: Normal range of motion ?Cardiovascular: Normal rate ?Pulmonary/chest: Effort normal ?Neurologic: Patient is alert ?Skin: Skin is warm ?Psychiatric: Patient has normal mood and affect ? ? ?Ortho Exam: Ortho exam demonstrates passive range of motion on the right and 70/110/175.  Passive  range of motion on the left is 25/60/90.  Rotator cuff strength is intact bilaterally infraspinatus supraspinatus subscap muscle testing.  No discrete AC joint tenderness bilaterally.  Neck range of motion is full.  Deltoid is functional. ? ?Specialty Comments:  ?No specialty comments available. ? ?Imaging: ?No results found. ? ? ?PMFS History: ?Patient Active Problem List  ? Diagnosis Date Noted  ? Abdominal pain, epigastric 01/08/2021  ? N&V (nausea and vomiting) 01/08/2021  ? Loose stools 06/04/2019  ? Hepatic steatosis 05/23/2019  ? Psoriasis 05/09/2019  ? Depression, major, single episode, mild ([facility]) 10/12/2018  ?  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  ? Nocturnal hypoxemia 05/29/2017  ? GAD (generalized anxiety disorder) 02/13/2017  ? Restrictive lung disease 01/27/2017  ? Lung nodule < 6cm on CT 01/27/2017  ? GERD (gastroesophageal reflux disease) 11/18/2016  ? Snoring 11/18/2016  ? Witnessed episode of apnea 11/18/2016  ? Chronic seasonal allergic rhinitis 11/18/2016  ? Restless leg syndrome 11/18/2016  ? Fibromyalgia 11/18/2016  ? Hyperlipidemia associated with type 2 diabetes mellitus ([facility]) 06/09/2016  ? Vitamin D deficiency 05/16/2016  ? Hyperthyroidism 05/16/2016  ? Morbid obesity ([facility]) 05/13/2016  ? Uncontrolled type 2 diabetes mellitus with hyperglycemia ([facility]) 05/13/2016  ? COPD (chronic obstructive pulmonary disease) ([facility]) 05/13/2016  ? Chest pain syndrome   ? Hypertension associated with diabetes ([facility])   ? Stopped smoking with greater than 25 pack year history   ? ?Past Medical History:  ?Diagnosis Date  ? Allergic rhinitis   ? Anxiety and depression   ? Cataract   ? Chest pain syndrome   ? COPD (chronic obstructive pulmonary disease) ([facility])   ? Depression   ? Diabetes mellitus without complication ([facility])   ? History of kidney stones   ? HTN (hypertension)   ? Hypercholesterolemia   ? Hyperglycemia   ? Hypertension   ? Hypothyroidism   ? Obesity   ? Restless leg syndrome   ? Tobacco abuse   ?  ?Family History  ?Adopted: Yes  ?  ?Past Surgical History:  ?Procedure Laterality Date  ? ABDOMINAL HYSTERECTOMY    ? CARDIAC CATHETERIZATION  2014  ? CATARACT EXTRACTION W/PHACO Left 06/23/2016  ? Procedure: CATARACT EXTRACTION PHACO AND INTRAOCULAR LENS PLACEMENT ([facility]);  Surgeon: Gemma PayorKerry Hunt, MD;  Location: [facility];  Service: Ophthalmology;  Laterality: Left;  left - pt knows to arrive at 10:50 ?cde 9.11  ? CESAREAN SECTION    ? CHOLECYSTECTOMY    ? COLONOSCOPY WITH PROPOFOL N/A 10/14/2019  ? Procedure: COLONOSCOPY WITH PROPOFOL;  Surgeon: Corbin Adeourk, Robert M, MD;  [facility];  Service: Endoscopy;   Laterality: N/A;  8:00  ? ESOPHAGOGASTRODUODENOSCOPY (EGD) WITH PROPOFOL N/A 02/01/2021  ? Procedure: ESOPHAGOGASTRODUODENOSCOPY (EGD) WITH PROPOFOL;  Surgeon: Corbin Adeourk, Robert M, MD;  [facility];  Service: Endoscopy;  Laterality: N/A;  10:00am  ? EXCISION MASS NECK    ? cyst  ? FOOT MASS EXCISION    ? left foot surgery    ? removal of cyst  ? NECK SURGERY    ? POLYPECTOMY  10/14/2019  ? Procedure: POLYPECTOMY;  Surgeon: Corbin Adeourk, Robert M, MD;  [facility];  Service: Endoscopy;;  ? THYROID SURGERY    ? Dr. Lovell SheehanJenkins  ? TONSILLECTOMY    ? ?Social History  ? ?Occupational History  ? Not on file  ?Tobacco Use  ? Smoking status: Former  ?  Packs/day: 1.00  ?  Years: 25.00  ?  Pack years: 25.00  ?  Types: Cigarettes  ?  Start date: 11/13/1988  ?  Quit date: 10/28/2016  ?  Years since quitting: 4.7  ? Smokeless tobacco: Never  ?Vaping Use  ? Vaping Use: Never used  ?Substance and Sexual Activity  ? Alcohol use: Not Currently  ?  Comment: For special occasions, very rare   ? Drug use: No  ? Sexual activity: Never  ?  Birth control/protection: Surgical  ? ? ? ? ? ?

## 2021-07-28 ENCOUNTER — Ambulatory Visit: Payer: Medicaid Other | Admitting: Orthopedic Surgery

## 2021-07-30 NOTE — Progress Notes (Signed)
Surgical Instructions ? ? ? Your procedure is scheduled on 08/05/21. ? Report to [facility] Main Entrance "A" at 5:30 A.M., then check in with the Admitting office. ? Call this number if you have problems the morning of surgery: ? [phone] ? ? If you have any questions prior to your surgery date call [phone]: Open Monday-Friday 8am-4pm ? ? ? Remember: ? Do not eat after midnight the night before your surgery ? ?You may drink clear liquids until 4:30am the morning of your surgery.   ?Clear liquids allowed are: Water, Non-Citrus Juices (without pulp), Carbonated Beverages, Clear Tea, Black Coffee ONLY (NO MILK, CREAM OR POWDERED CREAMER of any kind), and Gatorade ?  ? Take these medicines the morning of surgery with A SIP OF WATER:  ?escitalopram (LEXAPRO) ?gabapentin (NEURONTIN) ?Glycopyrrolate-Formoterol (BEVESPI AEROSPHERE) ?levothyroxine (SYNTHROID) ? ?IF NEEDED: ?budesonide (PULMICORT)  ?cyclobenzaprine (FLEXERIL) ? ?As of today, STOP taking any Aspirin (unless otherwise instructed by your surgeon) Aleve, Naproxen, Ibuprofen, Motrin, Advil, Goody's, BC's, all herbal medications, fish oil, and all vitamins. ? ?WHAT DO I DO ABOUT MY DIABETES MEDICATION? ? ? ?Do not take oral diabetes medicines (pills) the morning of surgery. ? ? ?THE MORNING OF SURGERY, do not take glimepiride (AMARYL) or metFORMIN (GLUCOPHAGE). ? ?The day of surgery, do not take other diabetes injectables, including Byetta (exenatide), Bydureon (exenatide ER), Victoza (liraglutide), or Trulicity (dulaglutide). ? ?If your CBG is greater than 220 mg/dL, you may take ? of your sliding scale (correction) dose of insulin. ? ? ?HOW TO MANAGE YOUR DIABETES ?BEFORE AND AFTER SURGERY ? ?Why is it important to control my blood sugar before and after surgery? ?Improving blood sugar levels before and after surgery helps healing and can limit problems. ?A way of improving blood sugar control is eating a healthy diet by: ? Eating less sugar and  carbohydrates ? Increasing activity/exercise ? Talking with your doctor about reaching your blood sugar goals ?High blood sugars (greater than 180 mg/dL) can raise your risk of infections and slow your recovery, so you will need to focus on controlling your diabetes during the weeks before surgery. ?Make sure that the doctor who takes care of your diabetes knows about your planned surgery including the date and location. ? ?How do I manage my blood sugar before surgery? ?Check your blood sugar at least 4 times a day, starting 2 days before surgery, to make sure that the level is not too high or low. ? ?Check your blood sugar the morning of your surgery when you wake up and every 2 hours until you get to the Short Stay unit. ? ?If your blood sugar is less than 70 mg/dL, you will need to treat for low blood sugar: ?Do not take insulin. ?Treat a low blood sugar (less than 70 mg/dL) with ? cup of clear juice (cranberry or apple), 4 glucose tablets, OR glucose gel. ?Recheck blood sugar in 15 minutes after treatment (to make sure it is greater than 70 mg/dL). If your blood sugar is not greater than 70 mg/dL on recheck, call [phone] for further instructions. ?Report your blood sugar to the short stay nurse when you get to Short Stay. ? ?If you are admitted to the hospital after surgery: ?Your blood sugar will be checked by the staff and you will probably be given insulin after surgery (instead of oral diabetes medicines) to make sure you have good blood sugar levels. ?The goal for blood sugar control after surgery is 80-180 mg/dL. ?        ?  Do not wear jewelry or makeup ?Do not wear lotions, powders, perfumes/colognes, or deodorant. ?Do not shave 48 hours prior to surgery.  Men may shave face and neck. ?Do not bring valuables to the hospital. ?Do not wear nail polish, gel polish, artificial nails, or any other type of covering on natural nails (fingers and toes) ?If you have artificial nails or gel coating that need  to be removed by a nail salon, please have this removed prior to surgery. Artificial nails or gel coating may interfere with anesthesia's ability to adequately monitor your vital signs. ? ?[facility] is not responsible for any belongings or valuables. .  ? ?Do NOT Smoke (Tobacco/Vaping)  24 hours prior to your procedure ? ?If you use a CPAP at night, you may bring your mask for your overnight stay. ?  ?Contacts, glasses, hearing aids, dentures or partials may not be worn into surgery, please bring cases for these belongings ?  ?For patients admitted to the hospital, discharge time will be determined by your treatment team. ?  ?Patients discharged the day of surgery will not be allowed to drive home, and someone needs to stay with them for 24 hours. ? ? ?SURGICAL WAITING ROOM VISITATION ?Patients having surgery or a procedure in a hospital may have two support people. ?Children under the age of 53 must have an adult with them who is not the patient. ?They may stay in the waiting area during the procedure and may switch out with other visitors. If the patient needs to stay at the hospital during part of their recovery, the visitor guidelines for inpatient rooms apply. ? ?Please refer to the [facility] website for the visitor guidelines for Inpatients (after your surgery is over and you are in a regular room).  ? ? ? ? ? ?Special instructions:   ? ?Oral Hygiene is also important to reduce your risk of infection.  Remember - BRUSH YOUR TEETH THE MORNING OF SURGERY WITH YOUR REGULAR TOOTHPASTE ? ? ?[facility]- Preparing For Surgery ? ?Before surgery, you can play an important role. Because skin is not sterile, your skin needs to be as free of germs as possible. You can reduce the number of germs on your skin by washing with CHG (chlorahexidine gluconate) Soap before surgery.  CHG is an antiseptic cleaner which kills germs and bonds with the skin to continue killing germs even after washing.   ? ? ?Please do not use if  you have an allergy to CHG or antibacterial soaps. If your skin becomes reddened/irritated stop using the CHG.  ?Do not shave (including legs and underarms) for at least 48 hours prior to first CHG shower. It is OK to shave your face. ? ?Please follow these instructions carefully. ?  ? ? Shower the NIGHT BEFORE SURGERY and the MORNING OF SURGERY with CHG Soap.  ? If you chose to wash your hair, wash your hair first as usual with your normal shampoo. After you shampoo, rinse your hair and body thoroughly to remove the shampoo.  Then [employer]ucor CorporationWash Face and genitals (private parts) with your normal soap and rinse thoroughly to remove soap. ? ?After that Use CHG Soap as you would any other liquid soap. You can apply CHG directly to the skin and wash gently with a scrungie or a clean washcloth.  ? ?Apply the CHG Soap to your body ONLY FROM THE NECK DOWN.  Do not use on open wounds or open sores. Avoid contact with your eyes, ears, mouth and  genitals (private parts). Wash Face and genitals (private parts)  with your normal soap.  ? ?Wash thoroughly, paying special attention to the area where your surgery will be performed. ? ?Thoroughly rinse your body with warm water from the neck down. ? ?DO NOT shower/wash with your normal soap after using and rinsing off the CHG Soap. ? ?Pat yourself dry with a CLEAN TOWEL. ? ?Wear CLEAN PAJAMAS to bed the night before surgery ? ?Place CLEAN SHEETS on your bed the night before your surgery ? ?DO NOT SLEEP WITH PETS. ? ? ?Day of Surgery: ?Take a shower with CHG soap. ?Wear Clean/Comfortable clothing the morning of surgery ?Do not apply any deodorants/lotions.   ?Remember to brush your teeth WITH YOUR REGULAR TOOTHPASTE. ? ? ? ?If you received a COVID test during your pre-op visit  it is requested that you wear a mask when out in public, stay away from anyone that may not be feeling well and notify your surgeon if you develop symptoms. If you have been in contact with anyone that has tested  positive in the last 10 days please notify you surgeon. ? ?  ?Please read over the following fact sheets that you were given.  ? ?

## 2021-08-02 ENCOUNTER — Encounter (HOSPITAL_COMMUNITY)
Admission: RE | Admit: 2021-08-02 | Discharge: 2021-08-02 | Disposition: A | Discharge status: Home / Self Care | Payer: Medicaid Other | Source: Ambulatory Visit | Attending: Orthopedic Surgery | Admitting: Orthopedic Surgery

## 2021-08-02 ENCOUNTER — Encounter (HOSPITAL_COMMUNITY): Payer: Self-pay

## 2021-08-02 ENCOUNTER — Other Ambulatory Visit: Payer: Self-pay

## 2021-08-02 VITALS — BP 131/74 | HR 78 | Temp 97.8°F | Resp 17 | Ht 64.0 in | Wt 204.0 lb

## 2021-08-02 DIAGNOSIS — Z01818 Encounter for other preprocedural examination: Secondary | ICD-10-CM | POA: Insufficient documentation

## 2021-08-02 DIAGNOSIS — E1165 Type 2 diabetes mellitus with hyperglycemia: Secondary | ICD-10-CM | POA: Insufficient documentation

## 2021-08-02 LAB — BASIC METABOLIC PANEL
Anion gap: 7 (ref 5–15)
BUN: 11 mg/dL (ref 6–20)
CO2: 27 mmol/L (ref 22–32)
Calcium: 10.2 mg/dL (ref 8.9–10.3)
Chloride: 105 mmol/L (ref 98–111)
Creatinine, Ser: 0.73 mg/dL (ref 0.44–1.00)
GFR, Estimated: >60 mL/min (ref >60)
Glucose, Bld: 204 mg/dL — ABNORMAL HIGH (ref 70–99)
Potassium: 4.3 mmol/L (ref 3.5–5.1)
Sodium: 139 mmol/L (ref 135–145)

## 2021-08-02 LAB — CBC
HCT: 44.4 % (ref 36.0–46.0)
Hemoglobin: 14.7 g/dL (ref 12.0–15.0)
MCH: 31.7 pg (ref 26.0–34.0)
MCHC: 33.1 g/dL (ref 30.0–36.0)
MCV: 95.9 fL (ref 80.0–100.0)
Platelets: 290 10*3/uL (ref 150–400)
RBC: 4.63 MIL/uL (ref 3.87–5.11)
RDW: 14.2 % (ref 11.5–15.5)
WBC: 10.1 10*3/uL (ref 4.0–10.5)
nRBC: 0 % (ref 0.0–0.2)

## 2021-08-02 LAB — GLUCOSE, CAPILLARY: Glucose-Capillary: 207 mg/dL — ABNORMAL HIGH (ref 70–99)

## 2021-08-02 NOTE — Progress Notes (Signed)
PCP - Jannifer Rodney, [name] ?Cardiologist - denies ? ?PPM/ICD - denies ? ? ?Chest x-ray - 11/11/16 ?EKG - 08/02/21 ?Stress Test - 01/2012, normal per pt and notes ?ECHO - 08/16/16 ?Cardiac Cath - 2014 ? ?Sleep Study - 5 years ago, OSA- ? ?DM- Type 2 ?Fasting Blood Sugar - 130-150 ?Checks Blood Sugar 3 times a day ? ?ASA/Blood Thinner Instructions: n/a. Pt states she has not taken ASA in 6-7 months ? ? ?ERAS Protcol - yes ?PRE-SURGERY G2- given at PAT ? ?COVID TEST- n/a ? ? ?Anesthesia review: no ? ?Patient denies shortness of breath, fever, cough and chest pain at PAT appointment ? ? ?All instructions explained to the patient, with a verbal understanding of the material. Patient agrees to go over the instructions while at home for a better understanding. Patient also instructed to notify surgeon of any contact with COVID+ person or if she develops any symptoms. The opportunity to ask questions was provided. ?  ?

## 2021-08-03 LAB — HEMOGLOBIN A1C
Hgb A1c MFr Bld: 8.3 % — ABNORMAL HIGH (ref 4.8–5.6)
Mean Plasma Glucose: 192 mg/dL

## 2021-08-04 NOTE — Anesthesia Preprocedure Evaluation (Addendum)
Anesthesia Evaluation  ?Patient identified by MRN, date of birth, ID band ?Patient awake ? ? ? ?Reviewed: ?Allergy & Precautions, NPO status , Patient's Chart, lab work & pertinent test results ? ?Airway ?Mallampati: II ? ?TM Distance: >3 FB ?Neck ROM: Full ? ? ? Dental ?no notable dental hx. ? ?  ?Pulmonary ?asthma , COPD,  COPD inhaler, former smoker,  ?  ?Pulmonary exam normal ? ? ? ? ? ? ? Cardiovascular ?hypertension, Pt. on medications ? ?Rhythm:Regular Rate:Normal ? ? ?  ?Neuro/Psych ?Anxiety Depression negative neurological ROS ?   ? GI/Hepatic ?Neg liver ROS, GERD  ,  ?Endo/Other  ?diabetes, Type 2, Oral Hypoglycemic AgentsHypothyroidism  ? Renal/GU ?negative Renal ROS  ? ?  ?Musculoskeletal ? ?(+) Arthritis , Osteoarthritis,  Fibromyalgia - ? Abdominal ?Normal abdominal exam  (+)   ?Peds ? Hematology ?negative hematology ROS ?(+)   ?Anesthesia Other Findings ? ? Reproductive/Obstetrics ? ?  ? ? ? ? ? ? ? ? ? ? ? ? ? ?  ?  ? ? ? ? ? ? ? ?Anesthesia Physical ?Anesthesia Plan ? ?ASA: 2 ? ?Anesthesia Plan: General and Regional  ? ?Post-op Pain Management: Regional block*, Celebrex PO (pre-op)* and Tylenol PO (pre-op)*  ? ?Induction: Intravenous ? ?PONV Risk Score and Plan: 3 and Ondansetron, Dexamethasone, Midazolam and Treatment may vary due to age or medical condition ? ?Airway Management Planned: Mask and Oral ETT ? ?Additional Equipment: None ? ?Intra-op Plan:  ? ?Post-operative Plan: Extubation in OR ? ?Informed Consent: I have reviewed the patients History and Physical, chart, labs and discussed the procedure including the risks, benefits and alternatives for the proposed anesthesia with the patient or authorized representative who has indicated his/her understanding and acceptance.  ? ? ? ?Dental advisory given ? ?Plan Discussed with: CRNA ? ?Anesthesia Plan Comments: (Lab Results ?     Component                Value               Date                 ?     WBC                       10.1                08/02/2021           ?     HGB                      14.7                08/02/2021           ?     HCT                      44.4                08/02/2021           ?     MCV                      95.9                08/02/2021           ?     PLT                        290                 08/02/2021           ?Lab Results ?     Component                Value               Date                 ?     NA                       139                 08/02/2021           ?     K                        4.3                 08/02/2021           ?     CO2                      27                  08/02/2021           ?     GLUCOSE                  204 (H)             08/02/2021           ?     BUN                      11                  08/02/2021           ?     CREATININE               0.73                08/02/2021           ?     CALCIUM                  10.2                08/02/2021           ?     EGFR                     104                 05/13/2021           ?     GFRNONAA                 >60                 08/02/2021          )  ? ? ? ? ? ?Anesthesia Quick Evaluation ? ?

## 2021-08-05 ENCOUNTER — Ambulatory Visit (HOSPITAL_BASED_OUTPATIENT_CLINIC_OR_DEPARTMENT_OTHER): Payer: Medicaid Other | Admitting: Anesthesiology

## 2021-08-05 ENCOUNTER — Ambulatory Visit (HOSPITAL_COMMUNITY): Payer: Medicaid Other | Admitting: Anesthesiology

## 2021-08-05 ENCOUNTER — Encounter (HOSPITAL_COMMUNITY)
Admission: RE | Disposition: A | Discharge status: Home / Self Care | Payer: Self-pay | Source: Home / Self Care | Attending: Orthopedic Surgery

## 2021-08-05 ENCOUNTER — Other Ambulatory Visit: Payer: Self-pay

## 2021-08-05 ENCOUNTER — Ambulatory Visit (HOSPITAL_COMMUNITY)
Admission: RE | Admit: 2021-08-05 | Discharge: 2021-08-05 | Disposition: A | Discharge status: Home / Self Care | Payer: Medicaid Other | Attending: Orthopedic Surgery | Admitting: Orthopedic Surgery

## 2021-08-05 ENCOUNTER — Encounter (HOSPITAL_COMMUNITY): Payer: Self-pay | Admitting: Orthopedic Surgery

## 2021-08-05 DIAGNOSIS — J449 Chronic obstructive pulmonary disease, unspecified: Secondary | ICD-10-CM | POA: Diagnosis not present

## 2021-08-05 DIAGNOSIS — E119 Type 2 diabetes mellitus without complications: Secondary | ICD-10-CM | POA: Insufficient documentation

## 2021-08-05 DIAGNOSIS — M7502 Adhesive capsulitis of left shoulder: Secondary | ICD-10-CM

## 2021-08-05 DIAGNOSIS — Z9981 Dependence on supplemental oxygen: Secondary | ICD-10-CM | POA: Diagnosis not present

## 2021-08-05 DIAGNOSIS — Z7984 Long term (current) use of oral hypoglycemic drugs: Secondary | ICD-10-CM | POA: Diagnosis not present

## 2021-08-05 DIAGNOSIS — I10 Essential (primary) hypertension: Secondary | ICD-10-CM | POA: Diagnosis not present

## 2021-08-05 DIAGNOSIS — F418 Other specified anxiety disorders: Secondary | ICD-10-CM | POA: Insufficient documentation

## 2021-08-05 DIAGNOSIS — Z87891 Personal history of nicotine dependence: Secondary | ICD-10-CM | POA: Diagnosis not present

## 2021-08-05 DIAGNOSIS — G8918 Other acute postprocedural pain: Secondary | ICD-10-CM | POA: Diagnosis not present

## 2021-08-05 DIAGNOSIS — Z01818 Encounter for other preprocedural examination: Secondary | ICD-10-CM

## 2021-08-05 HISTORY — PX: SHOULDER ARTHROSCOPY: SHX128

## 2021-08-05 LAB — GLUCOSE, CAPILLARY: Glucose-Capillary: 161 mg/dL — ABNORMAL HIGH (ref 70–99)

## 2021-08-05 SURGERY — ARTHROSCOPY, SHOULDER
Anesthesia: Regional | Site: Shoulder | Laterality: Left

## 2021-08-05 MED ORDER — METHOCARBAMOL 500 MG PO TABS: 500.0000 mg | ORAL_TABLET | Freq: Three times a day (TID) | ORAL | 0 refills | Status: DC | PRN

## 2021-08-05 MED ORDER — MIDAZOLAM HCL 2 MG/2ML IJ SOLN
INTRAMUSCULAR | Status: DC | PRN
  Administered 2021-08-05: 2 mg via INTRAVENOUS

## 2021-08-05 MED ORDER — EPINEPHRINE PF 1 MG/ML IJ SOLN
INTRAMUSCULAR | Status: AC
  Filled 2021-08-05: qty 4

## 2021-08-05 MED ORDER — PROPOFOL 500 MG/50ML IV EMUL
INTRAVENOUS | Status: DC | PRN
  Administered 2021-08-05: 25 ug/kg/min via INTRAVENOUS

## 2021-08-05 MED ORDER — LIDOCAINE 2% (20 MG/ML) 5 ML SYRINGE
INTRAMUSCULAR | Status: DC | PRN
  Administered 2021-08-05: 60 mg via INTRAVENOUS

## 2021-08-05 MED ORDER — BUPIVACAINE LIPOSOME 1.3 % IJ SUSP
INTRAMUSCULAR | Status: DC | PRN
  Administered 2021-08-05: 10 mL via PERINEURAL

## 2021-08-05 MED ORDER — SUGAMMADEX SODIUM 200 MG/2ML IV SOLN
INTRAVENOUS | Status: DC | PRN
  Administered 2021-08-05: 370 mg via INTRAVENOUS

## 2021-08-05 MED ORDER — CEFAZOLIN SODIUM-DEXTROSE 2-4 GM/100ML-% IV SOLN
2.0000 g | INTRAVENOUS | Status: AC
  Administered 2021-08-05: 2 g via INTRAVENOUS

## 2021-08-05 MED ORDER — FENTANYL CITRATE (PF) 250 MCG/5ML IJ SOLN
INTRAMUSCULAR | Status: DC | PRN
  Administered 2021-08-05: 50 ug via INTRAVENOUS
  Administered 2021-08-05: 100 ug via INTRAVENOUS

## 2021-08-05 MED ORDER — FENTANYL CITRATE (PF) 250 MCG/5ML IJ SOLN
INTRAMUSCULAR | Status: AC
  Filled 2021-08-05: qty 5

## 2021-08-05 MED ORDER — ROCURONIUM BROMIDE 10 MG/ML (PF) SYRINGE
PREFILLED_SYRINGE | INTRAVENOUS | Status: DC | PRN
  Administered 2021-08-05: 60 mg via INTRAVENOUS

## 2021-08-05 MED ORDER — ORAL CARE MOUTH RINSE: 15.0000 mL | Freq: Once | OROMUCOSAL | Status: AC

## 2021-08-05 MED ORDER — LACTATED RINGERS IV SOLN: INTRAVENOUS | Status: DC

## 2021-08-05 MED ORDER — OXYCODONE HCL 5 MG PO TABS: 5.0000 mg | ORAL_TABLET | Freq: Four times a day (QID) | ORAL | 0 refills | Status: DC | PRN

## 2021-08-05 MED ORDER — POVIDONE-IODINE 7.5 % EX SOLN
Freq: Once | CUTANEOUS | Status: DC
  Filled 2021-08-05: qty 118

## 2021-08-05 MED ORDER — CHLORHEXIDINE GLUCONATE 0.12 % MT SOLN: 15.0000 mL | Freq: Once | OROMUCOSAL | Status: AC

## 2021-08-05 MED ORDER — CEFAZOLIN SODIUM-DEXTROSE 2-4 GM/100ML-% IV SOLN
INTRAVENOUS | Status: AC
  Filled 2021-08-05: qty 100

## 2021-08-05 MED ORDER — SODIUM CHLORIDE 0.9 % IV SOLN: INTRAVENOUS | Status: DC | PRN

## 2021-08-05 MED ORDER — MIDAZOLAM HCL 2 MG/2ML IJ SOLN
INTRAMUSCULAR | Status: AC
  Filled 2021-08-05: qty 2

## 2021-08-05 MED ORDER — PROPOFOL 10 MG/ML IV BOLUS
INTRAVENOUS | Status: DC | PRN
  Administered 2021-08-05: 170 mg via INTRAVENOUS

## 2021-08-05 MED ORDER — IBUPROFEN 800 MG PO TABS: 800.0000 mg | ORAL_TABLET | Freq: Two times a day (BID) | ORAL | 0 refills | Status: DC

## 2021-08-05 MED ORDER — PHENYLEPHRINE HCL-NACL 20-0.9 MG/250ML-% IV SOLN
INTRAVENOUS | Status: DC | PRN
  Administered 2021-08-05: 50 ug/min via INTRAVENOUS

## 2021-08-05 MED ORDER — POVIDONE-IODINE 10 % EX SWAB
2.0000 "application " | Freq: Once | CUTANEOUS | Status: AC
  Administered 2021-08-05: 2 via TOPICAL

## 2021-08-05 MED ORDER — BUPIVACAINE HCL (PF) 0.5 % IJ SOLN
INTRAMUSCULAR | Status: DC | PRN
  Administered 2021-08-05: 15 mL via PERINEURAL

## 2021-08-05 MED ORDER — ACETAMINOPHEN 500 MG PO TABS: 1000.0000 mg | ORAL_TABLET | Freq: Once | ORAL | Status: AC

## 2021-08-05 MED ORDER — CHLORHEXIDINE GLUCONATE 0.12 % MT SOLN
OROMUCOSAL | Status: AC
  Administered 2021-08-05: 15 mL via OROMUCOSAL
  Filled 2021-08-05: qty 15

## 2021-08-05 MED ORDER — CELECOXIB 200 MG PO CAPS
ORAL_CAPSULE | ORAL | Status: AC
  Administered 2021-08-05: 200 mg via ORAL
  Filled 2021-08-05: qty 1

## 2021-08-05 MED ORDER — ONDANSETRON HCL 4 MG/2ML IJ SOLN
INTRAMUSCULAR | Status: DC | PRN
  Administered 2021-08-05: 4 mg via INTRAVENOUS

## 2021-08-05 MED ORDER — SODIUM CHLORIDE 0.9 % IR SOLN
Status: DC | PRN
  Administered 2021-08-05 (×2): 3000 mL

## 2021-08-05 MED ORDER — CELECOXIB 200 MG PO CAPS: 200.0000 mg | ORAL_CAPSULE | Freq: Once | ORAL | Status: AC

## 2021-08-05 MED ORDER — INSULIN ASPART 100 UNIT/ML IJ SOLN
0.0000 [IU] | INTRAMUSCULAR | Status: DC | PRN
  Administered 2021-08-05: 2 [IU] via SUBCUTANEOUS

## 2021-08-05 MED ORDER — DEXAMETHASONE SODIUM PHOSPHATE 10 MG/ML IJ SOLN
INTRAMUSCULAR | Status: DC | PRN
  Administered 2021-08-05: 10 mg via INTRAVENOUS

## 2021-08-05 MED ORDER — INSULIN ASPART 100 UNIT/ML IJ SOLN
INTRAMUSCULAR | Status: AC
  Filled 2021-08-05: qty 1

## 2021-08-05 MED ORDER — ACETAMINOPHEN 500 MG PO TABS
ORAL_TABLET | ORAL | Status: AC
  Administered 2021-08-05: 1000 mg via ORAL
  Filled 2021-08-05: qty 2

## 2021-08-05 MED ORDER — EPINEPHRINE PF 1 MG/ML IJ SOLN
INTRAMUSCULAR | Status: DC | PRN
  Administered 2021-08-05 (×2): 1 mg

## 2021-08-05 SURGICAL SUPPLY — 43 items
ALCOHOL 70% 16 OZ (MISCELLANEOUS) ×2 IMPLANT
BAG COUNTER SPONGE SURGICOUNT (BAG) ×2 IMPLANT
BLADE EXCALIBUR 4.0X13 (MISCELLANEOUS) ×2 IMPLANT
BLADE SURG 11 STRL SS (BLADE) ×2 IMPLANT
COOLER ICEMAN CLASSIC (MISCELLANEOUS) ×1 IMPLANT
DRAPE IMP U-DRAPE 54X76 (DRAPES) ×2 IMPLANT
DRAPE INCISE IOBAN 66X45 STRL (DRAPES) ×3 IMPLANT
DRAPE U-SHAPE 47X51 STRL (DRAPES) ×4 IMPLANT
DRSG AQUACEL AG ADV 3.5X 4 (GAUZE/BANDAGES/DRESSINGS) ×2 IMPLANT
DRSG TEGADERM 4X4.75 (GAUZE/BANDAGES/DRESSINGS) ×2 IMPLANT
DURAPREP 26ML APPLICATOR (WOUND CARE) ×2 IMPLANT
DW OUTFLOW CASSETTE/TUBE SET (MISCELLANEOUS) ×2 IMPLANT
ELECT REM PT RETURN 9FT ADLT (ELECTROSURGICAL) ×2
ELECTRODE REM PT RTRN 9FT ADLT (ELECTROSURGICAL) ×1 IMPLANT
GAUZE SPONGE 4X4 12PLY STRL (GAUZE/BANDAGES/DRESSINGS) ×1 IMPLANT
GAUZE XEROFORM 1X8 LF (GAUZE/BANDAGES/DRESSINGS) ×2 IMPLANT
GLOVE SRG 8 PF TXTR STRL LF DI (GLOVE) ×1 IMPLANT
GLOVE SURG ENC TEXT LTX SZ6.5 (GLOVE) ×2 IMPLANT
GLOVE SURG LTX SZ8 (GLOVE) ×2 IMPLANT
GLOVE SURG UNDER POLY LF SZ6.5 (GLOVE) ×2 IMPLANT
GLOVE SURG UNDER POLY LF SZ8 (GLOVE) ×2
GOWN STRL REUS W/ TWL LRG LVL3 (GOWN DISPOSABLE) ×3 IMPLANT
GOWN STRL REUS W/TWL LRG LVL3 (GOWN DISPOSABLE) ×6
KIT BASIN OR (CUSTOM PROCEDURE TRAY) ×2 IMPLANT
KIT TURNOVER KIT B (KITS) ×2 IMPLANT
MANIFOLD NEPTUNE II (INSTRUMENTS) ×2 IMPLANT
NDL HYPO 25X1 1.5 SAFETY (NEEDLE) ×1 IMPLANT
NDL SPNL 18GX3.5 QUINCKE PK (NEEDLE) ×1 IMPLANT
NEEDLE HYPO 25X1 1.5 SAFETY (NEEDLE) ×2 IMPLANT
NEEDLE SPNL 18GX3.5 QUINCKE PK (NEEDLE) ×2 IMPLANT
NS IRRIG 1000ML POUR BTL (IV SOLUTION) ×2 IMPLANT
PACK SHOULDER (CUSTOM PROCEDURE TRAY) ×2 IMPLANT
PAD ARMBOARD 7.5X6 YLW CONV (MISCELLANEOUS) ×4 IMPLANT
PORT APPOLLO RF 90DEGREE MULTI (SURGICAL WAND) ×2 IMPLANT
RESTRAINT HEAD UNIVERSAL NS (MISCELLANEOUS) ×2 IMPLANT
SLING ARM FOAM STRAP LRG (SOFTGOODS) ×1 IMPLANT
SPONGE T-LAP 4X18 ~~LOC~~+RFID (SPONGE) ×3 IMPLANT
SUCTION FRAZIER HANDLE 10FR (MISCELLANEOUS) ×2
SUCTION TUBE FRAZIER 10FR DISP (MISCELLANEOUS) IMPLANT
SUT ETHILON 3 0 PS 1 (SUTURE) ×3 IMPLANT
TOWEL GREEN STERILE (TOWEL DISPOSABLE) ×2 IMPLANT
TOWEL GREEN STERILE FF (TOWEL DISPOSABLE) ×2 IMPLANT
TUBING ARTHROSCOPY IRRIG 16FT (MISCELLANEOUS) ×2 IMPLANT

## 2021-08-05 NOTE — Anesthesia Procedure Notes (Signed)
Anesthesia Regional Block: Interscalene brachial plexus block  ? ?Pre-Anesthetic Checklist: , timeout performed,  Correct Patient, Correct Site, Correct Laterality,  Correct Procedure, Correct Position, site marked,  Risks and benefits discussed,  Surgical consent,  Pre-op evaluation,  At surgeon's request and post-op pain management ? ?Laterality: Left ? ?Prep: Dura Prep     ?  ?Needles:  ?Injection technique: Single-shot ? ?Needle Type: Echogenic Stimulator Needle   ? ? ?Needle Length: 5cm  ?Needle Gauge: 20  ? ? ? ?Additional Needles: ? ? ?Procedures:,,,, ultrasound used (permanent image in chart),,    ?Narrative:  ?Start time: 08/05/2021 7:10 AM ?End time: 08/05/2021 7:14 AM ?Injection made incrementally with aspirations every 5 mL. ? ?Performed by: Personally  ?Anesthesiologist: Darral Dash, DO ? ?Additional Notes: ?Patient identified. Risks/Benefits/Options discussed with patient including but not limited to bleeding, infection, nerve damage, failed block, incomplete pain control. Patient expressed understanding and wished to proceed. All questions were answered. Sterile technique was used throughout the entire procedure. Please see nursing notes for vital signs. Aspirated in 5cc intervals with injection for negative confirmation. Patient was given instructions on fall risk and not to get out of bed. All questions and concerns addressed with instructions to call with any issues or inadequate analgesia.   ?  ? ? ? ? ?

## 2021-08-05 NOTE — Op Note (Signed)
NAME: Sherry Glass, Sherry Glass. ?MEDICAL RECORD NO: [id number] ?ACCOUNT NO: [id number] ?DATE OF BIRTH: [birth year] ?FACILITY: [facility] ?LOCATION: [facility]-PERIOP ?PHYSICIAN: Graylin Shiver. August Saucer, MD ? ?Operative Report  ? ?DATE OF PROCEDURE: 08/05/2021 ? ?PREOPERATIVE DIAGNOSIS:  Left frozen shoulder. ? ?POSTOPERATIVE DIAGNOSIS:  Left frozen shoulder. ? ?PROCEDURE:  Left shoulder manipulation under anesthesia with arthroscopy and rotator interval release. ? ?SURGEON:  Graylin Shiver. Arshi Duarte MD ? ?ASSISTANT:  None. ? ?INDICATIONS:  The patient is a 51 year old patient with left shoulder pain and frozen shoulder, refractory to nonoperative management, who presents for operative management after explanation of risks and benefits. ? ?DESCRIPTION OF PROCEDURE:  The patient was brought to the operating room where general anesthetic was induced.  Preoperative IV antibiotics were administered.  Left shoulder was examined under anesthesia.  Found to have external rotation at 15 degrees of ? adduction to about 30 degrees.  Isolated glenohumeral abduction was 65, isolated forward flexion was about 95.  The arm was then manipulated into full forward flexion to 170, minimizing the fulcrum length.  Also, abduction was performed up to 110.   ?External rotation then achieved out to approximately 60 degrees. This was increased to 75 degrees after rotator interval release.  The patient was then placed in the beach chair position with the head in neutral position.  Left arm, shoulder and hand  ?prescrubbed with alcohol and Betadine, allowed to air dry, prepped with DuraPrep solution and draped in a sterile manner.  Timeout had already been called.  Posterior portal created.  Anterior portal created under direct visualization.  Significant  ?synovitis present within the rotator interval.  The rotator cuff was intact.  Some degeneration around the anterior superior labrum was debrided.  Rotator interval was released with the Arthrocare wand.  Glenohumeral articular surfaces  were intact.   ?Anterior inferior, posterior inferior glenohumeral ligaments were intact. Following release the patient's external rotation increased to about 75 degrees. The cannula was then placed into the subacromial space where a sweeping of the trocar removed any  ?adhesions. The instruments were removed.  Portals were closed using 3-0 nylon.  Impervious dressings applied.  Shoulder immobilizer applied.  The patient tolerated the procedure well without immediate complications, transferred to the recovery room in  ?stable condition. ? ? ?PUS ?D: 08/05/2021 8:53:29 am T: 08/05/2021 11:49:00 am  ?JOB: [id number]/ [id number]  ?

## 2021-08-05 NOTE — Anesthesia Procedure Notes (Signed)
Procedure Name: Intubation ?Date/Time: 08/05/2021 7:45 AM ?Performed by: Minerva Ends, CRNA ?Pre-anesthesia Checklist: Patient identified, Emergency Drugs available, Suction available and Patient being monitored ?Patient Re-evaluated:Patient Re-evaluated prior to induction ?Oxygen Delivery Method: Circle system utilized ?Preoxygenation: Pre-oxygenation with 100% oxygen ?Induction Type: IV induction ?Ventilation: Mask ventilation without difficulty ?Laryngoscope Size: Mac and 3 ?Grade View: Grade III ?Tube type: Oral ?Tube size: 7.0 mm ?Number of attempts: 3 ?Airway Equipment and Method: Stylet, Oral airway, Bougie stylet and Video-laryngoscopy ?Placement Confirmation: ETT inserted through vocal cords under direct vision, positive ETCO2 and breath sounds checked- equal and bilateral ?Secured at: 23 cm ?Tube secured with: Tape ?Dental Injury: Teeth and Oropharynx as per pre-operative assessment  ?Difficulty Due To: Difficult Airway- due to anterior larynx and Difficult Airway-  due to edematous airway ?Comments: Attempt 1: unsuccessful DL Grade 3 view, no attempt to pass ETT ?Attempt 2: unsuccessful DL with bougie, no ETCO2 after ETT placement ?Attempt 3: Video laryngoscopy successful mac 3 ? ? ? ? ?

## 2021-08-05 NOTE — Brief Op Note (Signed)
? ?  08/05/2021 ? ?8:50 AM ? ?PATIENT:  Sherry Glass  51 y.o. female ? ?PRE-OPERATIVE DIAGNOSIS:  left frozen shoulder ? ?POST-OPERATIVE DIAGNOSIS:  left frozen shoulder ? ?PROCEDURE:  Procedure(s): ?left shoulder arthroscopy, manipulation under anesthesia, rotator interval release ? ?SURGEON:  Surgeon(s): ?Cammy Copa, MD ? ?ASSISTANT: none ? ?ANESTHESIA:   general ? ?EBL: 5 ml   ? ?No intake/output data recorded. ? ?BLOOD ADMINISTERED: none ? ?DRAINS: none  ? ?LOCAL MEDICATIONS USED:  none ? ?SPECIMEN:  No Specimen ? ?COUNTS:  YES ? ?TOURNIQUET:  * No tourniquets in log * ? ?DICTATION: .Other Dictation: Dictation Number [id number] ? ?PLAN OF CARE: Discharge to home after PACU ? ?PATIENT DISPOSITION:  PACU - hemodynamically stable ? ? ? ? ? ? ? ? ? ? ? ? ?  ?

## 2021-08-05 NOTE — Anesthesia Postprocedure Evaluation (Signed)
Anesthesia Post Note ? ?Patient: HASET OAXACA ? ?Procedure(s) Performed: left shoulder arthroscopy, manipulation under anesthesia, rotator interval release (Left: Shoulder) ? ?  ? ?Patient location during evaluation: PACU ?Anesthesia Type: Regional and General ?Level of consciousness: awake and alert ?Pain management: pain level controlled ?Vital Signs Assessment: post-procedure vital signs reviewed and stable ?Respiratory status: spontaneous breathing, nonlabored ventilation, respiratory function stable and patient connected to nasal cannula oxygen ?Cardiovascular status: blood pressure returned to baseline and stable ?Postop Assessment: no apparent nausea or vomiting ?Anesthetic complications: no ? ? ?No notable events documented. ? ?Last Vitals:  ?Vitals:  ? 08/05/21 0910 08/05/21 0925  ?BP: 129/72 137/80  ?Pulse: 83 79  ?Resp: (!) 21 20  ?Temp:  36.5 ?C  ?SpO2: 93% 94%  ?  ?Last Pain:  ?Vitals:  ? 08/05/21 0925  ?TempSrc:   ?PainSc: 0-No pain  ? ? ?  ?  ?  ?  ?  ?  ? ?Nelle Don Hilarie Sinha ? ? ? ? ?

## 2021-08-05 NOTE — Transfer of Care (Signed)
Immediate Anesthesia Transfer of Care Note ? ?Patient: Sherry Glass ? ?Procedure(s) Performed: left shoulder arthroscopy, manipulation under anesthesia, rotator interval release (Left: Shoulder) ? ?Patient Location: PACU ? ?Anesthesia Type:GA combined with regional for post-op pain ? ?Level of Consciousness: awake, alert  and oriented ? ?Airway & Oxygen Therapy: Patient Spontanous Breathing ? ?Post-op Assessment: Report given to RN and Post -op Vital signs reviewed and stable ? ?Post vital signs: Reviewed and stable ? ?Last Vitals:  ?Vitals Value Taken Time  ?BP    ?Temp    ?Pulse    ?Resp    ?SpO2    ? ? ?Last Pain:  ?Vitals:  ? 08/05/21 0606  ?TempSrc:   ?PainSc: 7   ?   ? ?  ? ?Complications: No notable events documented. ?

## 2021-08-05 NOTE — H&P (Signed)
Sherry Glass is an 51 y.o. female.   ?Chief Complaint: left shoulder pain ?HPI: Sherry Glass is a patient with left shoulder pain.  She reports insidious onset of pain 9 months ago.  The pain has become worse.  Denies any history of injury.  She is right-hand dominant.  Describes decreased range of motion.  The pain wakes her from sleep at night.  No prior surgery.  Ibuprofen is not helpful.  Patient does have COPD and she uses O2 at night only.  She tried physical therapy which made her symptoms worse she tried an injection which gave her an allergic reaction.  The pain is primarily anterior but it does work its way around the shoulder.  Pain radiates to the elbow and infrequently below the elbow.  Really hard for her to do anything with the arm.  She has had an MRI scan which is reviewed with the patient.  It does show some narrowing of the axillary recess but no rotator cuff tear no muscle atrophy in the biceps looks intact.  No significant arthritis in the [facility] joint or glenohumeral joint.  Patient does not want to do another injection. ? ?Past Medical History:  ?Diagnosis Date  ? Allergic rhinitis   ? Anxiety and depression   ? Asthma   ? pt states this doesn't really bother her anymore  ? Cataract   ? Chest pain syndrome   ? COPD (chronic obstructive pulmonary disease) (HCC)   ? Depression   ? Diabetes mellitus without complication (HCC)   ? History of kidney stones   ? HTN (hypertension)   ? Hypercholesterolemia   ? Hyperglycemia   ? Hypertension   ? Hypothyroidism   ? Obesity   ? Restless leg syndrome   ? Tobacco abuse   ? ? ?Past Surgical History:  ?Procedure Laterality Date  ? ABDOMINAL HYSTERECTOMY    ? CARDIAC CATHETERIZATION  2014  ? CATARACT EXTRACTION Right 2023  ? CATARACT EXTRACTION W/PHACO Left 06/23/2016  ? Procedure: CATARACT EXTRACTION PHACO AND INTRAOCULAR LENS PLACEMENT (IOC);  Surgeon: Gemma Payor, MD;  Location: [facility];  Service: Ophthalmology;  Laterality: Left;  left - pt knows to arrive at  10:50 ?cde 9.11  ? CESAREAN SECTION    ? CHOLECYSTECTOMY    ? COLONOSCOPY WITH PROPOFOL N/A 10/14/2019  ? Procedure: COLONOSCOPY WITH PROPOFOL;  Surgeon: Corbin Ade, MD;  [facility];  Service: Endoscopy;  Laterality: N/A;  8:00  ? ESOPHAGOGASTRODUODENOSCOPY (EGD) WITH PROPOFOL N/A 02/01/2021  ? Procedure: ESOPHAGOGASTRODUODENOSCOPY (EGD) WITH PROPOFOL;  Surgeon: Corbin Ade, MD;  [facility];  Service: Endoscopy;  Laterality: N/A;  10:00am  ? EXCISION MASS NECK    ? cyst  ? left foot surgery    ? removal of cyst  ? POLYPECTOMY  10/14/2019  ? Procedure: POLYPECTOMY;  Surgeon: Corbin Ade, MD;  [facility]: [facility];  Service: Endoscopy;;  ? THYROID SURGERY    ? Dr. Lovell Sheehan  ? TONSILLECTOMY    ? ? ?Family History  ?Adopted: Yes  ? ?Social History:  reports that she quit smoking about 4 years ago. Her smoking use included cigarettes. She started smoking about 32 years ago. She has a 25.00 pack-year smoking history. She has never used smokeless tobacco. She reports that she does not currently use alcohol. She reports that she does not use drugs. ? ?Allergies:  ?Allergies  ?Allergen Reactions  ? Dilaudid [Hydromorphone]   ?  Itching and vomitting  ?  Doxycycline Itching  ? Valproic Acid And Related Nausea And Vomiting  ? ? ?Medications Prior to Admission  ?Medication Sig Dispense Refill  ? budesonide (PULMICORT) 0.5 MG/2ML nebulizer solution Take 2 mLs (0.5 mg total) by nebulization 2 (two) times daily. (Patient taking differently: Take 0.5 mg by nebulization 2 (two) times daily as needed (wheezing/shortness of breath.).) 120 mL 3  ? cyclobenzaprine (FLEXERIL) 10 MG tablet Take 1 tablet (10 mg total) by mouth 3 (three) times daily as needed. for muscle spams 90 tablet 1  ? Dulaglutide (TRULICITY) 1.5 MG/0.5ML SOPN Inject 1.5 mg into the skin every Saturday. 6 mL 2  ? escitalopram (LEXAPRO) 20 MG tablet Take 1 tablet (20 mg total) by mouth daily. 30 tablet 5  ? gabapentin (NEURONTIN)  300 MG capsule Take 1 capsule (300 mg total) by mouth 3 (three) times daily. 90 capsule 2  ? glimepiride (AMARYL) 2 MG tablet Take 1 tablet (2 mg total) by mouth daily with breakfast. (NEEDS TO BE SEEN BEFORE NEXT REFILL) 90 tablet 1  ? Glycopyrrolate-Formoterol (BEVESPI AEROSPHERE) 9-4.8 MCG/ACT AERO Inhale 2 puffs into the lungs 2 (two) times daily. 5.9 g 0  ? levothyroxine (SYNTHROID) 100 MCG tablet Take 1 tablet (100 mcg total) by mouth daily before breakfast. 95 tablet 3  ? losartan (COZAAR) 100 MG tablet Take 1 tablet (100 mg total) by mouth daily. 90 tablet 1  ? metFORMIN (GLUCOPHAGE) 1000 MG tablet Take 1 tablet (1,000 mg total) by mouth 2 (two) times daily with a meal. (NEEDS TO BE SEEN BEFORE NEXT REFILL) 180 tablet 1  ? rosuvastatin (CRESTOR) 10 MG tablet Take 1 tablet (10 mg total) by mouth daily. 90 tablet 3  ? TREMFYA 100 MG/ML SOSY Inject 100 mg into the skin every 8 (eight) weeks.    ? vitamin B-12 (CYANOCOBALAMIN) 1000 MCG tablet Take 1,000 mcg by mouth in the morning.    ? zinc gluconate 50 MG tablet Take 50 mg by mouth in the morning.    ? albuterol (VENTOLIN HFA) 108 (90 Base) MCG/ACT inhaler Inhale 2 puffs into the lungs every 6 (six) hours as needed for wheezing or shortness of breath. (Patient not taking: Reported on 07/27/2021) 8 g 2  ? aspirin EC 81 MG tablet Take one po qd 90 tablet 1  ? ? ?Results for orders placed or performed during the hospital encounter of 08/05/21 (from the past 48 hour(s))  ?Glucose, capillary     Status: Abnormal  ? Collection Time: 08/05/21  5:47 AM  ?Result Value Ref Range  ? Glucose-Capillary 161 (H) 70 - 99 mg/dL  ?  Comment: Glucose reference range applies only to samples taken after fasting for at least 8 hours.  ? ?No results found. ? ?Review of Systems  ?Musculoskeletal:  Positive for arthralgias.  ?All other systems reviewed and are negative. ? ?Blood pressure 134/69, pulse 84, temperature 98.1 ?F (36.7 ?C), temperature source Oral, resp. rate 18, height 5'  4" (1.626 m), weight 92.5 kg, SpO2 97 %. ?Physical Exam ?Vitals reviewed.  ?HENT:  ?   Head: Normocephalic.  ?   Nose: Nose normal.  ?   Mouth/Throat:  ?   Mouth: Mucous membranes are moist.  ?Eyes:  ?   Pupils: Pupils are equal, round, and reactive to light.  ?Cardiovascular:  ?   Rate and Rhythm: Normal rate.  ?   Pulses: Normal pulses.  ?Pulmonary:  ?   Effort: Pulmonary effort is normal.  ?Abdominal:  ?   General:  Abdomen is flat.  ?Musculoskeletal:  ?   Cervical back: Normal range of motion.  ?Skin: ?   General: Skin is warm.  ?   Capillary Refill: Capillary refill takes less than 2 seconds.  ?Neurological:  ?   General: No focal deficit present.  ?   Mental Status: She is alert.  ?Psychiatric:     ?   Mood and Affect: Mood normal.  ?  ?Ortho Exam: Ortho exam demonstrates passive range of motion on the right and 70/110/175.  Passive range of motion on the left is 25/60/90.  Rotator cuff strength is intact bilaterally infraspinatus supraspinatus subscap muscle testing.  No discrete AC joint tenderness bilaterally.  Neck range of motion is full.  Deltoid is functional. ?Assessment/Plan ?Impression is refractory adhesive capsulitis in a patient with diabetes who has failed conservative measures.  She does have fairly restricted passive range of motion today.  Discussed with her operative and nonoperative options.  In general she would like to undergo an intervention which can help.  I did tell her that she has with surgery or nonoperative treatment still a lot of pain to go through to improve.  Plan at this time is operative evaluation with manipulation under anesthesia and rotator interval release followed by home CPM machine for 6 hours at a minimum for the first week.  The risk and benefits of surgery are discussed with the patient including not limited to infection nerve vessel damage fracture as well as incomplete restoration of motion and function.  I did tell the patient that in people with diabetes this  can be a very refractory problem.  Patient understands and wishes to proceed.  All questions answered ? ?Burnard BuntingG Scott Chantille Navarrete, MD ?08/05/2021, 6:30 AM ? ? ? ?

## 2021-08-06 ENCOUNTER — Encounter (HOSPITAL_COMMUNITY): Payer: Self-pay | Admitting: Orthopedic Surgery

## 2021-08-12 ENCOUNTER — Ambulatory Visit (INDEPENDENT_AMBULATORY_CARE_PROVIDER_SITE_OTHER): Payer: Medicaid Other | Admitting: Surgical

## 2021-08-12 ENCOUNTER — Encounter: Payer: Self-pay | Admitting: Orthopedic Surgery

## 2021-08-12 DIAGNOSIS — M7502 Adhesive capsulitis of left shoulder: Secondary | ICD-10-CM

## 2021-08-12 NOTE — Progress Notes (Signed)
? ?Post-Op Visit Note ?  ?Patient: Sherry Glass           ?Date of Birth: 01-01-1971           ?MRN: [id number] ?Visit Date: 08/12/2021 ?PCP: Junie Spencer, [name] ? ? ?Assessment & Plan: ? ?Chief Complaint:  ?Chief Complaint  ?Patient presents with  ? Left Shoulder - Routine Post Op  ?   08/05/21 (7d) left shoulder arthroscopy, manipulation under anesthesia, rotator interval release  ? ?  ? ?Visit Diagnoses:  ?1. Adhesive capsulitis of left shoulder   ? ? ?Plan: Patient is a 51 year old female who presents s/p left shoulder arthroscopy with manipulation under anesthesia and rotator interval release on 08/05/2021.  She states that she is doing well with minimal pain.  Only having to take ibuprofen and occasional muscle relaxer for pain control.  Does not have to take any opioid medications.  She is able to sleep in her bed.  She has been using a CPM machine set at 90 degrees for 2 to 3 hours/day.  She is doing a home exercise program as well with exercises she learned from her past experience in physical therapy last several months.  She did notice a loud pop in her sleep last night that caused her pain but has not caused any lingering pain and she feels back to her baseline today.  No bruising or swelling that she has noticed. ? ?On exam, patient has 30 degrees external rotation, 75 degrees abduction, 115 degrees forward flexion.  Incisions are healing well without evidence of infection or dehiscence.  Sutures removed and replaced with Steri-Strips.  Intact EPL, FPL, finger abduction, finger adduction, pronation/supination, bicep, tricep, deltoid.  Axillary nerve intact with deltoid firing. ? ?Plan is continue with CPM machine and home exercise program.  Recommended patient obtain a shoulder pulley.  Follow-up in 4 weeks for clinical recheck.  If she is not making any improvement between then and now, she will consider formal physical therapy but for now she wants to just continue with home exercise program.  She  already notices that she can put her hand on her head which she was unable to do prior to surgery. ? ?Follow-Up Instructions: No follow-ups on file.  ? ?Orders:  ?No orders of the defined types were placed in this encounter. ? ?No orders of the defined types were placed in this encounter. ? ? ?Imaging: ?No results found. ? ?PMFS History: ?Patient Active Problem List  ? Diagnosis Date Noted  ? Abdominal pain, epigastric 01/08/2021  ? N&V (nausea and vomiting) 01/08/2021  ? Loose stools 06/04/2019  ? Hepatic steatosis 05/23/2019  ? Psoriasis 05/09/2019  ? Depression, major, single episode, mild (HCC) 10/12/2018  ? 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  ? Nocturnal hypoxemia 05/29/2017  ? GAD (generalized anxiety disorder) 02/13/2017  ? Restrictive lung disease 01/27/2017  ? Lung nodule < 6cm on CT 01/27/2017  ? GERD (gastroesophageal reflux disease) 11/18/2016  ? Snoring 11/18/2016  ? Witnessed episode of apnea 11/18/2016  ? Chronic seasonal allergic rhinitis 11/18/2016  ? Restless leg syndrome 11/18/2016  ? Fibromyalgia 11/18/2016  ? Hyperlipidemia associated with type 2 diabetes mellitus (HCC) 06/09/2016  ? Vitamin D deficiency 05/16/2016  ? Hyperthyroidism 05/16/2016  ? Morbid obesity (HCC) 05/13/2016  ? Uncontrolled type 2 diabetes mellitus with hyperglycemia (HCC) 05/13/2016  ? COPD (chronic obstructive pulmonary disease) (HCC) 05/13/2016  ? Chest pain syndrome   ? Hypertension associated with diabetes (HCC)   ? Stopped smoking with  greater than 25 pack year history   ? ?Past Medical History:  ?Diagnosis Date  ? Allergic rhinitis   ? Anxiety and depression   ? Asthma   ? pt states this doesn't really bother her anymore  ? Cataract   ? Chest pain syndrome   ? COPD (chronic obstructive pulmonary disease) (HCC)   ? Depression   ? Diabetes mellitus without complication (HCC)   ? History of kidney stones   ? HTN (hypertension)   ? Hypercholesterolemia   ? Hyperglycemia   ? Hypertension   ?  Hypothyroidism   ? Obesity   ? Restless leg syndrome   ? Tobacco abuse   ?  ?Family History  ?Adopted: Yes  ?  ?Past Surgical History:  ?Procedure Laterality Date  ? ABDOMINAL HYSTERECTOMY    ? CARDIAC CATHETERIZATION  2014  ? CATARACT EXTRACTION Right 2023  ? CATARACT EXTRACTION W/PHACO Left 06/23/2016  ? Procedure: CATARACT EXTRACTION PHACO AND INTRAOCULAR LENS PLACEMENT (IOC);  Surgeon: Gemma Payor, MD;  Location: [facility];  Service: Ophthalmology;  Laterality: Left;  left - pt knows to arrive at 10:50 ?cde 9.11  ? CESAREAN SECTION    ? CHOLECYSTECTOMY    ? COLONOSCOPY WITH PROPOFOL N/A 10/14/2019  ? Procedure: COLONOSCOPY WITH PROPOFOL;  Surgeon: Corbin Ade, MD;  [facility];  Service: Endoscopy;  Laterality: N/A;  8:00  ? ESOPHAGOGASTRODUODENOSCOPY (EGD) WITH PROPOFOL N/A 02/01/2021  ? Procedure: ESOPHAGOGASTRODUODENOSCOPY (EGD) WITH PROPOFOL;  Surgeon: Corbin Ade, MD;  [facility];  Service: Endoscopy;  Laterality: N/A;  10:00am  ? EXCISION MASS NECK    ? cyst  ? left foot surgery    ? removal of cyst  ? POLYPECTOMY  10/14/2019  ? Procedure: POLYPECTOMY;  Surgeon: Corbin Ade, MD;  [facility];  Service: Endoscopy;;  ? SHOULDER ARTHROSCOPY Left 08/05/2021  ? Procedure: left shoulder arthroscopy, manipulation under anesthesia, rotator interval release;  Surgeon: Cammy Copa, MD;  Location: [facility] OR;  Service: Orthopedics;  Laterality: Left;  ? THYROID SURGERY    ? Dr. Lovell Sheehan  ? TONSILLECTOMY    ? ?Social History  ? ?Occupational History  ? Not on file  ?Tobacco Use  ? Smoking status: Former  ?  Packs/day: 1.00  ?  Years: 25.00  ?  Pack years: 25.00  ?  Types: Cigarettes  ?  Start date: 11/13/1988  ?  Quit date: 10/28/2016  ?  Years since quitting: 4.7  ? Smokeless tobacco: Never  ?Vaping Use  ? Vaping Use: Never used  ?Substance and Sexual Activity  ? Alcohol use: Not Currently  ?  Comment: For special occasions, very rare   ? Drug use: No  ? Sexual activity:  Never  ?  Birth control/protection: Surgical  ? ? ? ?

## 2021-08-23 ENCOUNTER — Encounter: Payer: Self-pay | Admitting: Orthopedic Surgery

## 2021-08-25 DIAGNOSIS — L400 Psoriasis vulgaris: Secondary | ICD-10-CM | POA: Diagnosis not present

## 2021-08-27 ENCOUNTER — Encounter: Payer: Medicaid Other | Admitting: Surgical

## 2021-08-29 DIAGNOSIS — M7502 Adhesive capsulitis of left shoulder: Secondary | ICD-10-CM

## 2021-08-30 DIAGNOSIS — R531 Weakness: Secondary | ICD-10-CM | POA: Diagnosis not present

## 2021-08-30 DIAGNOSIS — Z79899 Other long term (current) drug therapy: Secondary | ICD-10-CM | POA: Diagnosis not present

## 2021-08-30 DIAGNOSIS — L400 Psoriasis vulgaris: Secondary | ICD-10-CM | POA: Diagnosis not present

## 2021-09-10 ENCOUNTER — Ambulatory Visit (INDEPENDENT_AMBULATORY_CARE_PROVIDER_SITE_OTHER): Payer: Medicaid Other | Admitting: Orthopedic Surgery

## 2021-09-10 ENCOUNTER — Encounter: Payer: Self-pay | Admitting: Orthopedic Surgery

## 2021-09-10 DIAGNOSIS — M7502 Adhesive capsulitis of left shoulder: Secondary | ICD-10-CM

## 2021-09-10 NOTE — Progress Notes (Signed)
? ?Post-Op Visit Note ?  ?Patient: Sherry Glass           ?Date of Birth: [birth year]           ?MRN: [id number] ?Visit Date: 09/10/2021 ?PCP: Junie Spencer, [name] ? ? ?Assessment & Plan: ? ?Chief Complaint:  ?Chief Complaint  ?Patient presents with  ? Left Shoulder - Routine Post Op  ?  08/05/21 left shoulder scope; MUA; rotator interval release  ? ?Visit Diagnoses:  ?1. Adhesive capsulitis of left shoulder   ? ? ?Plan: Sherry Glass is a 51 year old patient is now 4 weeks out left shoulder arthroscopy with manipulation under anesthesia and rotator interval release.  Sherry Glass is doing well.  Has some occasional pain around the [facility] joint wearing a bra.  No formal physical therapy but Sherry Glass is doing a home exercise program.  On examination today her range of motion is 50/90/160.  Previously her range of motion is 30/75/115.  Overall Sherry Glass is making improvement.  Plan is continue home exercise program and follow-up as needed.  Could consider AC joint injection in the future if symptoms worsen around that left AC joint. ? ?Follow-Up Instructions: No follow-ups on file.  ? ?Orders:  ?No orders of the defined types were placed in this encounter. ? ?No orders of the defined types were placed in this encounter. ? ? ?Imaging: ?No results found. ? ?PMFS History: ?Patient Active Problem List  ? Diagnosis Date Noted  ? Adhesive capsulitis of left shoulder   ? Abdominal pain, epigastric 01/08/2021  ? N&V (nausea and vomiting) 01/08/2021  ? Loose stools 06/04/2019  ? Hepatic steatosis 05/23/2019  ? Psoriasis 05/09/2019  ? Depression, major, single episode, mild (HCC) 10/12/2018  ? 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  ? Nocturnal hypoxemia 05/29/2017  ? GAD (generalized anxiety disorder) 02/13/2017  ? Restrictive lung disease 01/27/2017  ? Lung nodule < 6cm on CT 01/27/2017  ? GERD (gastroesophageal reflux disease) 11/18/2016  ? Snoring 11/18/2016  ? Witnessed episode of apnea 11/18/2016  ? Chronic seasonal allergic rhinitis  11/18/2016  ? Restless leg syndrome 11/18/2016  ? Fibromyalgia 11/18/2016  ? Hyperlipidemia associated with type 2 diabetes mellitus (HCC) 06/09/2016  ? Vitamin D deficiency 05/16/2016  ? Hyperthyroidism 05/16/2016  ? Morbid obesity (HCC) 05/13/2016  ? Uncontrolled type 2 diabetes mellitus with hyperglycemia (HCC) 05/13/2016  ? COPD (chronic obstructive pulmonary disease) (HCC) 05/13/2016  ? Chest pain syndrome   ? Hypertension associated with diabetes (HCC)   ? Stopped smoking with greater than 25 pack year history   ? ?Past Medical History:  ?Diagnosis Date  ? Allergic rhinitis   ? Anxiety and depression   ? Asthma   ? pt states this doesn't really bother her anymore  ? Cataract   ? Chest pain syndrome   ? COPD (chronic obstructive pulmonary disease) (HCC)   ? Depression   ? Diabetes mellitus without complication (HCC)   ? History of kidney stones   ? HTN (hypertension)   ? Hypercholesterolemia   ? Hyperglycemia   ? Hypertension   ? Hypothyroidism   ? Obesity   ? Restless leg syndrome   ? Tobacco abuse   ?  ?Family History  ?Adopted: Yes  ?  ?Past Surgical History:  ?Procedure Laterality Date  ? ABDOMINAL HYSTERECTOMY    ? CARDIAC CATHETERIZATION  2014  ? CATARACT EXTRACTION Right 2023  ? CATARACT EXTRACTION W/PHACO Left 06/23/2016  ? Procedure: CATARACT EXTRACTION PHACO AND INTRAOCULAR LENS PLACEMENT (IOC);  Surgeon: Nash Dimmer  Alto Denver, MD;  Location: [facility];  Service: Ophthalmology;  Laterality: Left;  left - pt knows to arrive at 10:50 ?cde 9.11  ? CESAREAN SECTION    ? CHOLECYSTECTOMY    ? COLONOSCOPY WITH PROPOFOL N/A 10/14/2019  ? Procedure: COLONOSCOPY WITH PROPOFOL;  Surgeon: Corbin Ade, MD;  [facility];  Service: Endoscopy;  Laterality: N/A;  8:00  ? ESOPHAGOGASTRODUODENOSCOPY (EGD) WITH PROPOFOL N/A 02/01/2021  ? Procedure: ESOPHAGOGASTRODUODENOSCOPY (EGD) WITH PROPOFOL;  Surgeon: Corbin Ade, MD;  [facility];  Service: Endoscopy;  Laterality: N/A;  10:00am  ? EXCISION MASS  NECK    ? cyst  ? left foot surgery    ? removal of cyst  ? POLYPECTOMY  10/14/2019  ? Procedure: POLYPECTOMY;  Surgeon: Corbin Ade, MD;  [facility]: [facility];  Service: Endoscopy;;  ? SHOULDER ARTHROSCOPY Left 08/05/2021  ? Procedure: left shoulder arthroscopy, manipulation under anesthesia, rotator interval release;  Surgeon: Cammy Copa, MD;  Location: [facility] OR;  Service: Orthopedics;  Laterality: Left;  ? THYROID SURGERY    ? Dr. Lovell Sheehan  ? TONSILLECTOMY    ? ?Social History  ? ?Occupational History  ? Not on file  ?Tobacco Use  ? Smoking status: Former  ?  Packs/day: 1.00  ?  Years: 25.00  ?  Pack years: 25.00  ?  Types: Cigarettes  ?  Start date: 11/13/1988  ?  Quit date: 10/28/2016  ?  Years since quitting: 4.8  ? Smokeless tobacco: Never  ?Vaping Use  ? Vaping Use: Never used  ?Substance and Sexual Activity  ? Alcohol use: Not Currently  ?  Comment: For special occasions, very rare   ? Drug use: No  ? Sexual activity: Never  ?  Birth control/protection: Surgical  ? ? ? ?

## 2021-09-14 ENCOUNTER — Other Ambulatory Visit: Payer: Self-pay | Admitting: "Endocrinology

## 2021-09-14 DIAGNOSIS — E890 Postprocedural hypothyroidism: Secondary | ICD-10-CM | POA: Diagnosis not present

## 2021-09-15 LAB — TSH: TSH: 8.74 u[IU]/mL — ABNORMAL HIGH (ref 0.450–4.500)

## 2021-09-15 LAB — T4, FREE: Free T4: 0.95 ng/dL (ref 0.82–1.77)

## 2021-09-22 ENCOUNTER — Ambulatory Visit: Payer: Medicaid Other | Admitting: "Endocrinology

## 2021-09-22 ENCOUNTER — Telehealth: Payer: Self-pay

## 2021-09-22 ENCOUNTER — Encounter: Payer: Self-pay | Admitting: "Endocrinology

## 2021-09-22 VITALS — BP 144/88 | HR 72 | Ht 64.0 in | Wt 203.4 lb

## 2021-09-22 DIAGNOSIS — E890 Postprocedural hypothyroidism: Secondary | ICD-10-CM

## 2021-09-22 MED ORDER — LEVOTHYROXINE SODIUM 125 MCG PO TABS: 125.0000 ug | ORAL_TABLET | Freq: Every day | ORAL | 1 refills | Status: DC

## 2021-09-22 NOTE — Progress Notes (Signed)
09/22/2021     Endocrinology follow-up note   Subjective:    Patient ID: Sherry Glass, female    DOB: 03/20/1971, PCP Junie Spencer, [name].   Past Medical History:  Diagnosis Date   Allergic rhinitis    Anxiety and depression    Asthma    pt states this doesn't really bother her anymore   Cataract    Chest pain syndrome    COPD (chronic obstructive pulmonary disease) (HCC)    Depression    Diabetes mellitus without complication (HCC)    History of kidney stones    HTN (hypertension)    Hypercholesterolemia    Hyperglycemia    Hypertension    Hypothyroidism    Obesity    Restless leg syndrome    Tobacco abuse    Past Surgical History:  Procedure Laterality Date   ABDOMINAL HYSTERECTOMY     CARDIAC CATHETERIZATION  2014   CATARACT EXTRACTION Right 2023   CATARACT EXTRACTION W/PHACO Left 06/23/2016   Procedure: CATARACT EXTRACTION PHACO AND INTRAOCULAR LENS PLACEMENT (IOC);  Surgeon: Gemma Payor, MD;  Location: [facility];  Service: Ophthalmology;  Laterality: Left;  left - pt knows to arrive at 10:50 cde 9.11   CESAREAN SECTION     CHOLECYSTECTOMY     COLONOSCOPY WITH PROPOFOL N/A 10/14/2019   Procedure: COLONOSCOPY WITH PROPOFOL;  Surgeon: Corbin Ade, MD;  Location: [facility];  Service: Endoscopy;  Laterality: N/A;  8:00   ESOPHAGOGASTRODUODENOSCOPY (EGD) WITH PROPOFOL N/A 02/01/2021   Procedure: ESOPHAGOGASTRODUODENOSCOPY (EGD) WITH PROPOFOL;  Surgeon: Corbin Ade, MD;  Location: [facility];  Service: Endoscopy;  Laterality: N/A;  10:00am   EXCISION MASS NECK     cyst   left foot surgery     removal of cyst   POLYPECTOMY  10/14/2019   Procedure: POLYPECTOMY;  Surgeon: Corbin Ade, MD;  Location: [facility];  Service: Endoscopy;;   SHOULDER ARTHROSCOPY Left 08/05/2021   Procedure: left shoulder arthroscopy, manipulation under anesthesia, rotator interval release;  Surgeon: Cammy Copa, MD;  Location: [facility] OR;  Service: Orthopedics;   Laterality: Left;   THYROID SURGERY     Dr. Lovell Sheehan   TONSILLECTOMY     Social History   Socioeconomic History   Marital status: Widowed    Spouse name: Not on file   Number of children: 4   Years of education: Not on file   Highest education level: Not on file  Occupational History   Not on file  Tobacco Use   Smoking status: Former    Packs/day: 1.00    Years: 25.00    Pack years: 25.00    Types: Cigarettes    Start date: 11/13/1988    Quit date: 10/28/2016    Years since quitting: 4.9   Smokeless tobacco: Never  Vaping Use   Vaping Use: Never used  Substance and Sexual Activity   Alcohol use: Not Currently    Comment: For special occasions, very rare    Drug use: No   Sexual activity: Never    Birth control/protection: Surgical  Other Topics Concern   Not on file  Social History Narrative   [facility] (11/18/16):   Originally from [place] but grew up in [place]. Cared for her husband until he passed. 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 Does have significant dust exposure. Has 3 dogs currently. Previously had a parakeet as a child. No mold exposure. Enjoys playing with  her grandchildren.        Social Determinants of Health   Financial Resource Strain: Not on file  Food Insecurity: Not on file  Transportation Needs: Not on file  Physical Activity: Not on file  Stress: Not on file  Social Connections: Not on file   Outpatient Encounter Medications as of 09/22/2021  Medication Sig   albuterol (VENTOLIN HFA) 108 (90 Base) MCG/ACT inhaler Inhale 2 puffs into the lungs every 6 (six) hours as needed for wheezing or shortness of breath. (Patient not taking: Reported on 07/27/2021)   aspirin EC 81 MG tablet Take one po qd   budesonide (PULMICORT) 0.5 MG/2ML nebulizer solution Take 2 mLs (0.5 mg total) by nebulization 2 (two) times daily. (Patient taking differently: Take 0.5 mg by nebulization 2 (two) times daily as needed  (wheezing/shortness of breath.).)   cyclobenzaprine (FLEXERIL) 10 MG tablet Take 1 tablet (10 mg total) by mouth 3 (three) times daily as needed. for muscle spams   Dulaglutide (TRULICITY) 1.5 MG/0.5ML SOPN Inject 1.5 mg into the skin every Saturday.   escitalopram (LEXAPRO) 20 MG tablet Take 1 tablet (20 mg total) by mouth daily.   gabapentin (NEURONTIN) 300 MG capsule Take 1 capsule (300 mg total) by mouth 3 (three) times daily.   glimepiride (AMARYL) 2 MG tablet Take 1 tablet (2 mg total) by mouth daily with breakfast. (NEEDS TO BE SEEN BEFORE NEXT REFILL)   Glycopyrrolate-Formoterol (BEVESPI AEROSPHERE) 9-4.8 MCG/ACT AERO Inhale 2 puffs into the lungs 2 (two) times daily.   ibuprofen (ADVIL) 800 MG tablet Take 1 tablet (800 mg total) by mouth 2 (two) times daily.   levothyroxine (SYNTHROID) 125 MCG tablet Take 1 tablet (125 mcg total) by mouth daily before breakfast.   losartan (COZAAR) 100 MG tablet Take 1 tablet (100 mg total) by mouth daily.   metFORMIN (GLUCOPHAGE) 1000 MG tablet Take 1 tablet (1,000 mg total) by mouth 2 (two) times daily with a meal. (NEEDS TO BE SEEN BEFORE NEXT REFILL)   methocarbamol (ROBAXIN) 500 MG tablet Take 1 tablet (500 mg total) by mouth every 8 (eight) hours as needed for muscle spasms.   oxyCODONE (ROXICODONE) 5 MG immediate release tablet Take 1 tablet (5 mg total) by mouth every 6 (six) hours as needed for severe pain.   rosuvastatin (CRESTOR) 10 MG tablet Take 1 tablet (10 mg total) by mouth daily.   TREMFYA 100 MG/ML SOSY Inject 100 mg into the skin every 8 (eight) weeks.   vitamin B-12 (CYANOCOBALAMIN) 1000 MCG tablet Take 1,000 mcg by mouth in the morning.   zinc gluconate 50 MG tablet Take 50 mg by mouth in the morning.   [DISCONTINUED] levothyroxine (SYNTHROID) 100 MCG tablet Take 1 tablet (100 mcg total) by mouth daily before breakfast.   No facility-administered encounter medications on file as of 09/22/2021.   ALLERGIES: Allergies  Allergen  Reactions   Dilaudid [Hydromorphone]     Itching and vomitting   Doxycycline Itching   Valproic Acid And Related Nausea And Vomiting   VACCINATION STATUS: Immunization History  Administered Date(s) Administered   Influenza,inj,Quad PF,6+ Mos 04/27/2017   Pneumococcal Polysaccharide-23 05/02/2005    HPI  Mrs. Coppock is status post radioactive iodine thyroid ablation on June 29, 2018.  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   She received RAI thyroid ablation to treat primary hyperthyroidism on June 29, 2018.  She was subsequently initiated on  Levothyroxine.  She is currently on levothyroxine 100 mcg  p.o. daily before breakfast.  Recently she ran out of her supplies for 5 days prior to her lab work.  Her previsit labs are consistent with under replacement.    The patient denies family history of thyroid dysfunction- she is adopted. -She denies palpitations, tremors, nor heat intolerance. - She has well-controlled type 2 diabetes with A1c of 7.5%  following with her PMD, taking Metformin, glimepiride, and Jardiance.    - She reports that she has quit smoking after decades of heavy smoking.     Review of Systems. Limited as above   Objective:    BP (!) 144/88   Pulse 72   Ht 5\' 4"  (1.626 m)   Wt 203 lb 6.4 oz (92.3 kg)   BMI 34.91 kg/m   Wt Readings from Last 3 Encounters:  09/22/21 203 lb 6.4 oz (92.3 kg)  08/05/21 204 lb (92.5 kg)  08/02/21 204 lb (92.5 kg)    Physical Exam   Lipid Panel     Component Value Date/Time   CHOL 135 09/18/2020 1038   TRIG 272 (H) 09/18/2020 1038   HDL 42 09/18/2020 1038   CHOLHDL 3.2 09/18/2020 1038   LDLCALC 51 09/18/2020 1038   Recent Results (from the past 2160 hour(s))  Glucose, capillary     Status: Abnormal   Collection Time: 08/02/21  9:45 AM  Result Value Ref Range   Glucose-Capillary 207 (H) 70 - 99 mg/dL    Comment: Glucose reference range applies only to samples taken after  fasting for at least 8 hours.  CBC     Status: None   Collection Time: 08/02/21 10:30 AM  Result Value Ref Range   WBC 10.1 4.0 - 10.5 K/uL   RBC 4.63 3.87 - 5.11 MIL/uL   Hemoglobin 14.7 12.0 - 15.0 g/dL   HCT 10/02/21 12.1 - 62.4 %   MCV 95.9 80.0 - 100.0 fL   MCH 31.7 26.0 - 34.0 pg   MCHC 33.1 30.0 - 36.0 g/dL   RDW 46.9 50.7 - 22.5 %   Platelets 290 150 - 400 K/uL   nRBC 0.0 0.0 - 0.2 %    Comment: Performed at [facility], [address] [place], [place]  Basic metabolic panel     Status: Abnormal   Collection Time: 08/02/21 10:30 AM  Result Value Ref Range   Sodium 139 135 - 145 mmol/L   Potassium 4.3 3.5 - 5.1 mmol/L   Chloride 105 98 - 111 mmol/L   CO2 27 22 - 32 mmol/L   Glucose, Bld 204 (H) 70 - 99 mg/dL    Comment: Glucose reference range applies only to samples taken after fasting for at least 8 hours.   BUN 11 6 - 20 mg/dL   Creatinine, Ser 10/02/21 0.44 - 1.00 mg/dL   Calcium 5.82 8.9 - 51.8 mg/dL   GFR, Estimated 98.4 >21 mL/min    Comment: (NOTE) Calculated using the CKD-EPI Creatinine Equation (2021)    Anion gap 7 5 - 15    Comment: Performed at [facility], [address], [place], [place]  Hemoglobin A1c per protocol     Status: Abnormal   Collection Time: 08/02/21 10:30 AM  Result Value Ref Range   Hgb A1c MFr Bld 8.3 (H) 4.8 - 5.6 %    Comment: (NOTE)         Prediabetes: 5.7 - 6.4         Diabetes: >6.4  Glycemic control for adults with diabetes: <7.0    Mean Plasma Glucose 192 mg/dL    Comment: (NOTE) Performed At: [facility] [address] [place], [place] [id number] Jolene SchimkeNagendra Sanjai MD OZ:[phone]   Glucose, capillary     Status: Abnormal   Collection Time: 08/05/21  5:47 AM  Result Value Ref Range   Glucose-Capillary 161 (H) 70 - 99 mg/dL    Comment: Glucose reference range applies only to samples taken after fasting for at least 8 hours.  T4, free     Status: None   Collection Time: 09/14/21  10:21 AM  Result Value Ref Range   Free T4 0.95 0.82 - 1.77 ng/dL  TSH     Status: Abnormal   Collection Time: 09/14/21 10:21 AM  Result Value Ref Range   TSH 8.740 (H) 0.450 - 4.500 uIU/mL     - Thyroid uptake and scan on 07/19/2016 was nonfocal with 25% uptake and 24 hours.  March 05, 2018 thyroid uptake and scan: FINDINGS: Normal thyroid scan.  No hot or cold nodules are identified.   4 hour I-131 uptake = 17.6% (normal 5-20%)  24 hour I-131 uptake = 30.0% (normal 10-30%)   IMPRESSION: 1. Normal thyroid scan. 2. Top-normal 24 hour iodine 131 uptake at 30%.  I-131 thyroid ablation on June 29, 2018.    Assessment & Plan:   1.  RAI induced hypothyroidism  2.  Type 2 diabetes -She is status post thyroid ablation with I-131 for significant T3 toxicosis.  Treatment was administered on June 29, 2018.   -Her previsit thyroid function tests are consistent with under replacement, however she ran out of her levothyroxine for 5 days before her lab work.  She would benefit from slight increase in levothyroxine.  I discussed and increase her levothyroxine to 125 mcg p.o. daily before breakfast.     - We discussed about the correct intake of her thyroid hormone, on empty stomach at fasting, with water, separated by at least 30 minutes from breakfast and other medications,  and separated by more than 4 hours from calcium, iron, multivitamins, acid reflux medications (PPIs). -Patient is made aware of the fact that thyroid hormone replacement is needed for life, dose to be adjusted by periodic monitoring of thyroid function tests.   She has type 2 diabetes with recent A1c of 8.3 %.  She is advised to continue metformin 1000 mg p.o. twice daily and glimepiride 2 mg p.o. daily at breakfast.  She may benefit from glipizide 5 mg XL p.o. daily at breakfast instead of glimepiride.  She wishes to continue follow-up with her PCP for diabetes care.  - I advised patient to maintain close  follow up with [name], [name], [name] for primary care needs, including diabetes care.    I spent 21 minutes in the care of the patient today including review of labs from Thyroid Function, CMP, and other relevant labs ; imaging/biopsy records (current and previous including abstractions from other facilities); face-to-face time discussing  her lab results and symptoms, medications doses, her options of short and long term treatment based on the latest standards of care / guidelines;   and documenting the encounter.  Daine Florasonia M Planck  participated in the discussions, expressed understanding, and voiced agreement with the above plans.  All questions were answered to her satisfaction. she is encouraged to contact clinic should she have any questions or concerns prior to her return visit.    Follow up plan: Return in about 3  months (around 12/23/2021) for F/U with Pre-visit Labs.  Marquis Lunch, MD Phone: [phone]  Fax: [phone]   09/22/2021, 1:23 PM

## 2021-09-22 NOTE — Telephone Encounter (Signed)
ERROR

## 2021-11-07 DIAGNOSIS — W1839XA Other fall on same level, initial encounter: Secondary | ICD-10-CM | POA: Diagnosis not present

## 2021-11-07 DIAGNOSIS — Z885 Allergy status to narcotic agent status: Secondary | ICD-10-CM | POA: Diagnosis not present

## 2021-11-07 DIAGNOSIS — J439 Emphysema, unspecified: Secondary | ICD-10-CM | POA: Diagnosis not present

## 2021-11-07 DIAGNOSIS — E785 Hyperlipidemia, unspecified: Secondary | ICD-10-CM | POA: Diagnosis not present

## 2021-11-07 DIAGNOSIS — E119 Type 2 diabetes mellitus without complications: Secondary | ICD-10-CM | POA: Diagnosis not present

## 2021-11-07 DIAGNOSIS — K760 Fatty (change of) liver, not elsewhere classified: Secondary | ICD-10-CM | POA: Diagnosis not present

## 2021-11-07 DIAGNOSIS — E278 Other specified disorders of adrenal gland: Secondary | ICD-10-CM | POA: Diagnosis not present

## 2021-11-07 DIAGNOSIS — Z881 Allergy status to other antibiotic agents status: Secondary | ICD-10-CM | POA: Diagnosis not present

## 2021-11-07 DIAGNOSIS — Z7982 Long term (current) use of aspirin: Secondary | ICD-10-CM | POA: Diagnosis not present

## 2021-11-07 DIAGNOSIS — Z7984 Long term (current) use of oral hypoglycemic drugs: Secondary | ICD-10-CM | POA: Diagnosis not present

## 2021-11-07 DIAGNOSIS — R0781 Pleurodynia: Secondary | ICD-10-CM | POA: Diagnosis not present

## 2021-11-07 DIAGNOSIS — S301XXA Contusion of abdominal wall, initial encounter: Secondary | ICD-10-CM | POA: Diagnosis not present

## 2021-11-07 DIAGNOSIS — S20212A Contusion of left front wall of thorax, initial encounter: Secondary | ICD-10-CM | POA: Diagnosis not present

## 2021-11-07 DIAGNOSIS — Z87891 Personal history of nicotine dependence: Secondary | ICD-10-CM | POA: Diagnosis not present

## 2021-11-07 DIAGNOSIS — R918 Other nonspecific abnormal finding of lung field: Secondary | ICD-10-CM | POA: Diagnosis not present

## 2021-11-07 DIAGNOSIS — Z79899 Other long term (current) drug therapy: Secondary | ICD-10-CM | POA: Diagnosis not present

## 2021-11-07 DIAGNOSIS — R911 Solitary pulmonary nodule: Secondary | ICD-10-CM | POA: Diagnosis not present

## 2021-11-07 DIAGNOSIS — N3289 Other specified disorders of bladder: Secondary | ICD-10-CM | POA: Diagnosis not present

## 2021-11-07 DIAGNOSIS — K5790 Diverticulosis of intestine, part unspecified, without perforation or abscess without bleeding: Secondary | ICD-10-CM | POA: Diagnosis not present

## 2021-11-07 DIAGNOSIS — I700 Atherosclerosis of aorta: Secondary | ICD-10-CM | POA: Diagnosis not present

## 2021-11-07 DIAGNOSIS — I10 Essential (primary) hypertension: Secondary | ICD-10-CM | POA: Diagnosis not present

## 2021-11-07 DIAGNOSIS — N390 Urinary tract infection, site not specified: Secondary | ICD-10-CM | POA: Diagnosis not present

## 2021-11-07 DIAGNOSIS — M47814 Spondylosis without myelopathy or radiculopathy, thoracic region: Secondary | ICD-10-CM | POA: Diagnosis not present

## 2021-11-11 ENCOUNTER — Ambulatory Visit: Payer: Medicaid Other | Admitting: Family

## 2021-11-11 ENCOUNTER — Encounter: Payer: Self-pay | Admitting: Family

## 2021-11-11 VITALS — BP 123/74 | HR 78 | Temp 98.3°F | Ht 64.0 in | Wt 206.0 lb

## 2021-11-11 DIAGNOSIS — E1159 Type 2 diabetes mellitus with other circulatory complications: Secondary | ICD-10-CM

## 2021-11-11 DIAGNOSIS — E890 Postprocedural hypothyroidism: Secondary | ICD-10-CM

## 2021-11-11 DIAGNOSIS — M797 Fibromyalgia: Secondary | ICD-10-CM

## 2021-11-11 DIAGNOSIS — E1169 Type 2 diabetes mellitus with other specified complication: Secondary | ICD-10-CM | POA: Diagnosis not present

## 2021-11-11 DIAGNOSIS — E785 Hyperlipidemia, unspecified: Secondary | ICD-10-CM | POA: Diagnosis not present

## 2021-11-11 DIAGNOSIS — E278 Other specified disorders of adrenal gland: Secondary | ICD-10-CM | POA: Diagnosis not present

## 2021-11-11 DIAGNOSIS — F411 Generalized anxiety disorder: Secondary | ICD-10-CM

## 2021-11-11 DIAGNOSIS — R911 Solitary pulmonary nodule: Secondary | ICD-10-CM

## 2021-11-11 DIAGNOSIS — I152 Hypertension secondary to endocrine disorders: Secondary | ICD-10-CM | POA: Diagnosis not present

## 2021-11-11 DIAGNOSIS — F320 Major depressive disorder, single episode, mild: Secondary | ICD-10-CM

## 2021-11-11 DIAGNOSIS — E559 Vitamin D deficiency, unspecified: Secondary | ICD-10-CM

## 2021-11-11 DIAGNOSIS — J441 Chronic obstructive pulmonary disease with (acute) exacerbation: Secondary | ICD-10-CM

## 2021-11-11 DIAGNOSIS — K219 Gastro-esophageal reflux disease without esophagitis: Secondary | ICD-10-CM

## 2021-11-11 LAB — BAYER DCA HB A1C WAIVED: HB A1C (BAYER DCA - WAIVED): 7.8 % — ABNORMAL HIGH (ref 4.8–5.6)

## 2021-11-11 NOTE — Patient Instructions (Signed)

## 2021-11-11 NOTE — Progress Notes (Signed)
Subjective:    Patient ID: Sherry Glass, female    DOB: [birth year], 51 y.o.   MRN: [id number]  Chief Complaint  Patient presents with   Diabetes    6 month diabetic followup   Pt presents to the office today for chronic follow up. Pt has seen Pulmonologist for COPD, Restrictive lung disease, and SOB, but has been cleared.  Followed by Endocrinologists yearly for hypothyroidism. She had thyroid ablation.   She is followed by [facility] as needed for chronic back pain.    She is morbid obese with a BMI of 35 and DM and HTN.   She went to ED on 11/07/21 that showed a lung nodule on right upper lobe and recommend repeat in 12 months.   Also accidentally finding of sub-cm adrenal mass, Probably benign, but recommend repeat scan.  Diabetes She presents for her follow-up diabetic visit. She has type 2 diabetes mellitus. Pertinent negatives for hypoglycemia include no nervousness/anxiousness. Associated symptoms include foot paresthesias. Pertinent negatives for diabetes include no blurred vision and no fatigue. Symptoms are stable. Diabetic complications include a CVA and peripheral neuropathy. Risk factors for coronary artery disease include diabetes mellitus, dyslipidemia, hypertension, sedentary lifestyle and post-menopausal. She is following a generally healthy diet. Her overall blood glucose range is 130-140 mg/dl. An ACE inhibitor/angiotensin II receptor blocker is being taken.  Hypertension This is a chronic problem. The current episode started more than 1 year ago. The problem has been resolved since onset. The problem is controlled. Associated symptoms include anxiety and malaise/fatigue. Pertinent negatives include no blurred vision, peripheral edema or shortness of breath. Risk factors for coronary artery disease include diabetes mellitus, dyslipidemia and sedentary lifestyle. The current treatment provides moderate improvement. Hypertensive end-organ damage includes CVA. Identifiable  causes of hypertension include a thyroid problem.  Gastroesophageal Reflux She complains of belching, heartburn and a hoarse voice. This is a chronic problem. The current episode started more than 1 year ago. The problem has been waxing and waning. Pertinent negatives include no fatigue. Risk factors include obesity. She has tried a PPI for the symptoms. The treatment provided moderate relief.  Thyroid Problem Presents for follow-up visit. Symptoms include hoarse voice. Patient reports no anxiety, constipation, depressed mood or fatigue. The symptoms have been stable. Her past medical history is significant for hyperlipidemia.  Hyperlipidemia This is a chronic problem. The current episode started more than 1 year ago. The problem is controlled. Recent lipid tests were reviewed and are normal. Exacerbating diseases include obesity. Pertinent negatives include no shortness of breath. Current antihyperlipidemic treatment includes statins. The current treatment provides moderate improvement of lipids. Risk factors for coronary artery disease include diabetes mellitus, dyslipidemia, hypertension, a sedentary lifestyle and post-menopausal.  Anxiety Presents for follow-up visit. Symptoms include excessive worry. Patient reports no depressed mood, irritability, nervous/anxious behavior or shortness of breath. Symptoms occur occasionally.    Depression        This is a chronic problem.  The current episode started more than 1 year ago.   Associated symptoms include no fatigue, no helplessness, no hopelessness, not irritable and not sad.  Past treatments include SSRIs - Selective serotonin reuptake inhibitors.  Past medical history includes thyroid problem and anxiety.       Review of Systems  Constitutional:  Positive for malaise/fatigue. Negative for fatigue and irritability.  HENT:  Positive for hoarse voice.   Eyes:  Negative for blurred vision.  Respiratory:  Negative for shortness of breath.    Gastrointestinal:  Positive for heartburn. Negative for constipation.  Psychiatric/Behavioral:  Positive for depression. The patient is not nervous/anxious.   All other systems reviewed and are negative.      Objective:   Physical Exam Vitals reviewed.  Constitutional:      General: She is not irritable.She is not in acute distress.    Appearance: She is well-developed. She is obese.  HENT:     Head: Normocephalic and atraumatic.     Right Ear: Tympanic membrane normal.     Left Ear: Tympanic membrane normal.  Eyes:     Pupils: Pupils are equal, round, and reactive to light.  Neck:     Thyroid: No thyromegaly.  Cardiovascular:     Rate and Rhythm: Normal rate and regular rhythm.     Heart sounds: Normal heart sounds. No murmur heard. Pulmonary:     Effort: Pulmonary effort is normal. No respiratory distress.     Breath sounds: Normal breath sounds. No wheezing.  Abdominal:     General: Bowel sounds are normal. There is no distension.     Palpations: Abdomen is soft.     Tenderness: There is no abdominal tenderness.  Musculoskeletal:        General: No tenderness. Normal range of motion.     Cervical back: Normal range of motion and neck supple.  Skin:    General: Skin is warm and dry.  Neurological:     Mental Status: She is alert and oriented to person, place, and time.     Cranial Nerves: No cranial nerve deficit.     Deep Tendon Reflexes: Reflexes are normal and symmetric.  Psychiatric:        Behavior: Behavior normal.        Thought Content: Thought content normal.        Judgment: Judgment normal.       BP 123/74   Pulse 78   Temp 98.3 F (36.8 C)   Ht $R'5\' 4"'AU$  (1.626 m)   Wt 206 lb (93.4 kg)   SpO2 99%   BMI 35.36 kg/m      Assessment & Plan:   Sherry Glass comes in today with chief complaint of Diabetes (6 month diabetic followup)   Diagnosis and orders addressed:  1. Chronic obstructive pulmonary disease with acute exacerbation (HCC) -  CMP14+EGFR - CBC with Differential/Platelet  2. Hypertension associated with diabetes ([place]) - CMP14+EGFR - CBC with Differential/Platelet  3. Gastroesophageal reflux disease, unspecified whether esophagitis present - CMP14+EGFR - CBC with Differential/Platelet  4. Hyperlipidemia associated with type 2 diabetes mellitus ([place]) - CMP14+EGFR - CBC with Differential/Platelet - Lipid panel  5. Hypothyroidism following radioiodine therapy - CMP14+EGFR - CBC with Differential/Platelet - Thyroid Panel With TSH  6. Type 2 diabetes mellitus with other specified complication, without long-term current use of insulin (HCC) - CMP14+EGFR - CBC with Differential/Platelet - Bayer DCA Hb A1c Waived  7. Morbid obesity ([place]) - CMP14+EGFR - CBC with Differential/Platelet  8. Vitamin D deficiency - CMP14+EGFR - CBC with Differential/Platelet  9. Depression, major, single episode, mild (HCC) - CMP14+EGFR - CBC with Differential/Platelet  10. GAD (generalized anxiety disorder) - CMP14+EGFR - CBC with Differential/Platelet  11. Fibromyalgia - CMP14+EGFR - CBC with Differential/Platelet  12. Lung nodule - CT CHEST ABDOMEN PELVIS W CONTRAST; Future - CMP14+EGFR - CBC with Differential/Platelet  13. Adrenal mass ([place]) - CT CHEST ABDOMEN PELVIS W CONTRAST; Future - CMP14+EGFR - CBC with Differential/Platelet   Labs pending Health Maintenance reviewed Diet and  exercise encouraged  Follow up plan: 3 months    Evelina Dun, [name]

## 2021-11-12 LAB — CBC WITH DIFFERENTIAL/PLATELET
Basophils Absolute: 0.1 10*3/uL (ref 0.0–0.2)
Basos: 1 %
EOS (ABSOLUTE): 0.2 10*3/uL (ref 0.0–0.4)
Eos: 2 %
Hematocrit: 39.5 % (ref 34.0–46.6)
Hemoglobin: 13.5 g/dL (ref 11.1–15.9)
Immature Grans (Abs): 0.1 10*3/uL (ref 0.0–0.1)
Immature Granulocytes: 1 %
Lymphocytes Absolute: 3.5 10*3/uL — ABNORMAL HIGH (ref 0.7–3.1)
Lymphs: 38 %
MCH: 32.5 pg (ref 26.6–33.0)
MCHC: 34.2 g/dL (ref 31.5–35.7)
MCV: 95 fL (ref 79–97)
Monocytes Absolute: 0.5 10*3/uL (ref 0.1–0.9)
Monocytes: 6 %
Neutrophils Absolute: 4.7 10*3/uL (ref 1.4–7.0)
Neutrophils: 52 %
Platelets: 270 10*3/uL (ref 150–450)
RBC: 4.16 x10E6/uL (ref 3.77–5.28)
RDW: 12.4 % (ref 11.7–15.4)
WBC: 9 10*3/uL (ref 3.4–10.8)

## 2021-11-12 LAB — CMP14+EGFR
ALT: 30 IU/L (ref 0–32)
AST: 22 IU/L (ref 0–40)
Albumin/Globulin Ratio: 1.8 (ref 1.2–2.2)
Albumin: 4.5 g/dL (ref 3.8–4.9)
Alkaline Phosphatase: 99 IU/L (ref 44–121)
BUN/Creatinine Ratio: 14 (ref 9–23)
BUN: 9 mg/dL (ref 6–24)
Bilirubin Total: 0.6 mg/dL (ref 0.0–1.2)
CO2: 23 mmol/L (ref 20–29)
Calcium: 9.5 mg/dL (ref 8.7–10.2)
Chloride: 102 mmol/L (ref 96–106)
Creatinine, Ser: 0.64 mg/dL (ref 0.57–1.00)
Globulin, Total: 2.5 g/dL (ref 1.5–4.5)
Glucose: 177 mg/dL — ABNORMAL HIGH (ref 70–99)
Potassium: 4.5 mmol/L (ref 3.5–5.2)
Sodium: 138 mmol/L (ref 134–144)
Total Protein: 7 g/dL (ref 6.0–8.5)
eGFR: 106 mL/min/{1.73_m2} (ref >59)

## 2021-11-12 LAB — THYROID PANEL WITH TSH
Free Thyroxine Index: 2.1 (ref 1.2–4.9)
T3 Uptake Ratio: 27 % (ref 24–39)
T4, Total: 7.9 ug/dL (ref 4.5–12.0)
TSH: 3.86 u[IU]/mL (ref 0.450–4.500)

## 2021-11-12 LAB — LIPID PANEL
Chol/HDL Ratio: 3.7 ratio (ref 0.0–4.4)
Cholesterol, Total: 152 mg/dL (ref 100–199)
HDL: 41 mg/dL (ref >39)
LDL Chol Calc (NIH): 74 mg/dL (ref 0–99)
Triglycerides: 223 mg/dL — ABNORMAL HIGH (ref 0–149)
VLDL Cholesterol Cal: 37 mg/dL (ref 5–40)

## 2021-12-14 ENCOUNTER — Encounter: Payer: Self-pay | Admitting: *Deleted

## 2021-12-15 DIAGNOSIS — E890 Postprocedural hypothyroidism: Secondary | ICD-10-CM | POA: Diagnosis not present

## 2021-12-16 LAB — T4, FREE: Free T4: 1.01 ng/dL (ref 0.82–1.77)

## 2021-12-16 LAB — TSH: TSH: 4.45 u[IU]/mL (ref 0.450–4.500)

## 2021-12-23 ENCOUNTER — Ambulatory Visit: Payer: Medicaid Other | Admitting: "Endocrinology

## 2021-12-23 ENCOUNTER — Encounter: Payer: Self-pay | Admitting: "Endocrinology

## 2021-12-23 VITALS — BP 122/76 | HR 68 | Ht 64.0 in | Wt 200.6 lb

## 2021-12-23 DIAGNOSIS — E890 Postprocedural hypothyroidism: Secondary | ICD-10-CM

## 2021-12-23 NOTE — Progress Notes (Signed)
12/23/2021    Endocrinology follow-up note   Subjective:    Patient ID: Sherry Glass, female    DOB: August 19, 1970, PCP Sharion Balloon, [name].   Past Medical History:  Diagnosis Date   Allergic rhinitis    Anxiety and depression    Asthma    pt states this doesn't really bother her anymore   Cataract    Chest pain syndrome    COPD (chronic obstructive pulmonary disease) ([place])    Depression    Diabetes mellitus without complication ([place])    History of kidney stones    HTN (hypertension)    Hypercholesterolemia    Hyperglycemia    Hypertension    Hypothyroidism    Obesity    Restless leg syndrome    Tobacco abuse    Past Surgical History:  Procedure Laterality Date   ABDOMINAL HYSTERECTOMY     CARDIAC CATHETERIZATION  2014   CATARACT EXTRACTION Right 2023   CATARACT EXTRACTION W/PHACO Left 06/23/2016   Procedure: CATARACT EXTRACTION PHACO AND INTRAOCULAR LENS PLACEMENT ([place]);  Surgeon: Tonny Branch, MD;  Location: [facility];  Service: Ophthalmology;  Laterality: Left;  left - pt knows to arrive at 10:50 cde 9.11   CESAREAN SECTION     CHOLECYSTECTOMY     COLONOSCOPY WITH PROPOFOL N/A 10/14/2019   Procedure: COLONOSCOPY WITH PROPOFOL;  Surgeon: Daneil Dolin, MD;  [facility];  Service: Endoscopy;  Laterality: N/A;  8:00   ESOPHAGOGASTRODUODENOSCOPY (EGD) WITH PROPOFOL N/A 02/01/2021   Procedure: ESOPHAGOGASTRODUODENOSCOPY (EGD) WITH PROPOFOL;  Surgeon: Daneil Dolin, MD;  [facility];  Service: Endoscopy;  Laterality: N/A;  10:00am   EXCISION MASS NECK     cyst   left foot surgery     removal of cyst   POLYPECTOMY  10/14/2019   Procedure: POLYPECTOMY;  Surgeon: Daneil Dolin, MD;  [facility]: [facility];  Service: Endoscopy;;   SHOULDER ARTHROSCOPY Left 08/05/2021   Procedure: left shoulder arthroscopy, manipulation under anesthesia, rotator interval release;  Surgeon: Meredith Pel, MD;  Location: [facility];  Service: Orthopedics;   Laterality: Left;   THYROID SURGERY     Dr. Arnoldo Morale   TONSILLECTOMY     Social History   Socioeconomic History   Marital status: Widowed    Spouse name: Not on file   Number of children: 4   Years of education: Not on file   Highest education level: Not on file  Occupational History   Not on file  Tobacco Use   Smoking status: Former    Packs/day: 1.00    Years: 25.00    Total pack years: 25.00    Types: Cigarettes    Start date: 11/13/1988    Quit date: 10/28/2016    Years since quitting: 5.1   Smokeless tobacco: Never  Vaping Use   Vaping Use: Never used  Substance and Sexual Activity   Alcohol use: Not Currently    Comment: For special occasions, very rare    Drug use: No   Sexual activity: Never    Birth control/protection: Surgical  Other Topics Concern   Not on file  Social History Narrative    Pulmonary (11/18/16):   Originally from [place] but grew up in [place]. Cared for her husband until he passed. 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 Does have significant dust exposure. Has 3 dogs currently. Previously had a parakeet as a child. No mold exposure. Enjoys playing with  her grandchildren.        Social Determinants of Health   Financial Resource Strain: Not on file  Food Insecurity: Not on file  Transportation Needs: Not on file  Physical Activity: Not on file  Stress: Not on file  Social Connections: Not on file   Outpatient Encounter Medications as of 12/23/2021  Medication Sig   albuterol (VENTOLIN HFA) 108 (90 Base) MCG/ACT inhaler Inhale 2 puffs into the lungs every 6 (six) hours as needed for wheezing or shortness of breath. (Patient not taking: Reported on 07/27/2021)   aspirin EC 81 MG tablet Take one po qd   budesonide (PULMICORT) 0.5 MG/2ML nebulizer solution Take 2 mLs (0.5 mg total) by nebulization 2 (two) times daily. (Patient taking differently: Take 0.5 mg by nebulization 2 (two) times daily as  needed (wheezing/shortness of breath.).)   cyclobenzaprine (FLEXERIL) 10 MG tablet Take 1 tablet (10 mg total) by mouth 3 (three) times daily as needed. for muscle spams   Dulaglutide (TRULICITY) 1.5 KZ/6.0FU SOPN Inject 1.5 mg into the skin every Saturday.   escitalopram (LEXAPRO) 20 MG tablet Take 1 tablet (20 mg total) by mouth daily.   gabapentin (NEURONTIN) 300 MG capsule Take 1 capsule (300 mg total) by mouth 3 (three) times daily.   glimepiride (AMARYL) 2 MG tablet Take 1 tablet (2 mg total) by mouth daily with breakfast. (NEEDS TO BE SEEN BEFORE NEXT REFILL)   Glycopyrrolate-Formoterol (BEVESPI AEROSPHERE) 9-4.8 MCG/ACT AERO Inhale 2 puffs into the lungs 2 (two) times daily.   ibuprofen (ADVIL) 800 MG tablet Take 1 tablet (800 mg total) by mouth 2 (two) times daily.   levothyroxine (SYNTHROID) 125 MCG tablet Take 1 tablet (125 mcg total) by mouth daily before breakfast.   losartan (COZAAR) 100 MG tablet Take 1 tablet (100 mg total) by mouth daily.   metFORMIN (GLUCOPHAGE) 1000 MG tablet Take 1 tablet (1,000 mg total) by mouth 2 (two) times daily with a meal. (NEEDS TO BE SEEN BEFORE NEXT REFILL)   methocarbamol (ROBAXIN) 500 MG tablet Take 1 tablet (500 mg total) by mouth every 8 (eight) hours as needed for muscle spasms.   oxyCODONE (ROXICODONE) 5 MG immediate release tablet Take 1 tablet (5 mg total) by mouth every 6 (six) hours as needed for severe pain.   rosuvastatin (CRESTOR) 10 MG tablet Take 1 tablet (10 mg total) by mouth daily.   TREMFYA 100 MG/ML SOSY Inject 100 mg into the skin every 8 (eight) weeks.   vitamin B-12 (CYANOCOBALAMIN) 1000 MCG tablet Take 1,000 mcg by mouth in the morning.   zinc gluconate 50 MG tablet Take 50 mg by mouth in the morning.   No facility-administered encounter medications on file as of 12/23/2021.   ALLERGIES: Allergies  Allergen Reactions   Dilaudid [Hydromorphone]     Itching and vomitting   Doxycycline Itching   Valproic Acid And Related  Nausea And Vomiting   VACCINATION STATUS: Immunization History  Administered Date(s) Administered   Influenza,inj,Quad PF,6+ Mos 04/27/2017   Pneumococcal Polysaccharide-23 05/02/2005    HPI  Sherry Glass is status post radioactive iodine thyroid ablation on June 29, 2018.  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   She received RAI thyroid ablation to treat primary hyperthyroidism on June 29, 2018.  She was subsequently initiated on  Levothyroxine.  She is currently on levothyroxine 125 mcg p.o. daily before breakfast.   She reports compliance and consistency taking her medication. The patient denies family history  of thyroid dysfunction- she is adopted. -She denies palpitations, tremors, nor heat intolerance. - She has well-controlled type 2 diabetes with A1c of 7.8%  following with her PMD, taking Metformin, Trulicity 1.5 mg subcutaneously weekly, metformin 1000 mg p.o. twice daily, glimepiride 2 mg p.o. daily.  - She reports that she has quit smoking after decades of heavy smoking.     Review of Systems. Limited as above   Objective:    BP 122/76   Pulse 68   Ht _0  (1.626 m)   Wt 200 lb 9.6 oz (91 kg)   BMI 34.43 kg/m   Wt Readings from Last 3 Encounters:  12/23/21 200 lb 9.6 oz (91 kg)  11/11/21 206 lb (93.4 kg)  09/22/21 203 lb 6.4 oz (92.3 kg)    Physical Exam   Lipid Panel     Component Value Date/Time   CHOL 152 11/11/2021 1037   TRIG 223 (H) 11/11/2021 1037   HDL 41 11/11/2021 1037   CHOLHDL 3.7 11/11/2021 1037   LDLCALC 74 11/11/2021 1037   Recent Results (from the past 2160 hour(s))  Bayer DCA Hb A1c Waived     Status: Abnormal   Collection Time: 11/11/21 10:34 AM  Result Value Ref Range   HB A1C (BAYER DCA - WAIVED) 7.8 (H) 4.8 - 5.6 %    Comment:          Prediabetes: 5.7 - 6.4          Diabetes: >6.4          Glycemic control for adults with diabetes: <7.0   CMP14+EGFR     Status: Abnormal    Collection Time: 11/11/21 10:37 AM  Result Value Ref Range   Glucose 177 (H) 70 - 99 mg/dL   BUN 9 6 - 24 mg/dL   Creatinine, Ser 0.64 0.57 - 1.00 mg/dL   eGFR 106 >59 mL/min/1.73   BUN/Creatinine Ratio 14 9 - 23   Sodium 138 134 - 144 mmol/L   Potassium 4.5 3.5 - 5.2 mmol/L   Chloride 102 96 - 106 mmol/L   CO2 23 20 - 29 mmol/L   Calcium 9.5 8.7 - 10.2 mg/dL   Total Protein 7.0 6.0 - 8.5 g/dL   Albumin 4.5 3.8 - 4.9 g/dL    Comment:               **Please note reference interval change**   Globulin, Total 2.5 1.5 - 4.5 g/dL   Albumin/Globulin Ratio 1.8 1.2 - 2.2   Bilirubin Total 0.6 0.0 - 1.2 mg/dL   Alkaline Phosphatase 99 44 - 121 IU/L   AST 22 0 - 40 IU/L   ALT 30 0 - 32 IU/L  CBC with Differential/Platelet     Status: Abnormal   Collection Time: 11/11/21 10:37 AM  Result Value Ref Range   WBC 9.0 3.4 - 10.8 x10E3/uL   RBC 4.16 3.77 - 5.28 x10E6/uL   Hemoglobin 13.5 11.1 - 15.9 g/dL   Hematocrit 39.5 34.0 - 46.6 %   MCV 95 79 - 97 fL   MCH 32.5 26.6 - 33.0 pg   MCHC 34.2 31.5 - 35.7 g/dL   RDW 12.4 11.7 - 15.4 %   Platelets 270 150 - 450 x10E3/uL   Neutrophils 52 Not Estab. %   Lymphs 38 Not Estab. %   Monocytes 6 Not Estab. %   Eos 2 Not Estab. %   Basos 1 Not Estab. %   Neutrophils Absolute 4.7 1.4 -  7.0 x10E3/uL   Lymphocytes Absolute 3.5 (H) 0.7 - 3.1 x10E3/uL   Monocytes Absolute 0.5 0.1 - 0.9 x10E3/uL   EOS (ABSOLUTE) 0.2 0.0 - 0.4 x10E3/uL   Basophils Absolute 0.1 0.0 - 0.2 x10E3/uL   Immature Granulocytes 1 Not Estab. %   Immature Grans (Abs) 0.1 0.0 - 0.1 x10E3/uL  Lipid panel     Status: Abnormal   Collection Time: 11/11/21 10:37 AM  Result Value Ref Range   Cholesterol, Total 152 100 - 199 mg/dL   Triglycerides 223 (H) 0 - 149 mg/dL   HDL 41 >39 mg/dL   VLDL Cholesterol Cal 37 5 - 40 mg/dL   LDL Chol Calc (NIH) 74 0 - 99 mg/dL   Chol/HDL Ratio 3.7 0.0 - 4.4 ratio    Comment:                                   T. Chol/HDL Ratio                                              Men  Women                               1/2 Avg.Risk  3.4    3.3                                   Avg.Risk  5.0    4.4                                2X Avg.Risk  9.6    7.1                                3X Avg.Risk 23.4   11.0   Thyroid Panel With TSH     Status: None   Collection Time: 11/11/21 10:37 AM  Result Value Ref Range   TSH 3.860 0.450 - 4.500 uIU/mL   T4, Total 7.9 4.5 - 12.0 ug/dL   T3 Uptake Ratio 27 24 - 39 %   Free Thyroxine Index 2.1 1.2 - 4.9  TSH     Status: None   Collection Time: 12/15/21  9:21 AM  Result Value Ref Range   TSH 4.450 0.450 - 4.500 uIU/mL  T4, free     Status: None   Collection Time: 12/15/21  9:21 AM  Result Value Ref Range   Free T4 1.01 0.82 - 1.77 ng/dL     - Thyroid uptake and scan on 07/19/2016 was nonfocal with 25% uptake and 24 hours.  March 05, 2018 thyroid uptake and scan: FINDINGS: Normal thyroid scan.  No hot or cold nodules are identified.   4 hour I-131 uptake = 17.6% (normal 5-20%)  24 hour I-131 uptake = 30.0% (normal 10-30%)   IMPRESSION: 1. Normal thyroid scan. 2. Top-normal 24 hour iodine 131 uptake at 30%.  I-131 thyroid ablation on June 29, 2018.    Assessment & Plan:   1.  RAI induced hypothyroidism  2.  Type 2 diabetes -She is status  post thyroid ablation with I-131 for significant T3 toxicosis.  Treatment was administered on June 29, 2018.   -Her previsit thyroid function tests are consistent with appropriate replacement.  She is advised to continue levothyroxine 125 mcg p.o. daily before breakfast.    - We discussed about the correct intake of her thyroid hormone, on empty stomach at fasting, with water, separated by at least 30 minutes from breakfast and other medications,  and separated by more than 4 hours from calcium, iron, multivitamins, acid reflux medications (PPIs). -Patient is made aware of the fact that thyroid hormone replacement is needed for life, dose to be  adjusted by periodic monitoring of thyroid function tests.   She has type 2 diabetes with recent A1c of 7.8 %.  She is following with her PCP for diabetes management currently on metformin 1000 mg twice daily, glimepiride 2 mg p.o. daily and Trulicity 1.5 mg subcutaneously weekly.      She wishes to continue follow-up with her PCP for diabetes care.  - I advised patient to maintain close follow up with Sharion Balloon, [name] for primary care needs, including diabetes care.  I spent 21 minutes in the care of the patient today including review of labs from Thyroid Function, CMP, and other relevant labs ; imaging/biopsy records (current and previous including abstractions from other facilities); face-to-face time discussing  her lab results and symptoms, medications doses, her options of short and long term treatment based on the latest standards of care / guidelines;   and documenting the encounter.  Seward Meth  participated in the discussions, expressed understanding, and voiced agreement with the above plans.  All questions were answered to her satisfaction. she is encouraged to contact clinic should she have any questions or concerns prior to her return visit.   Follow up plan: Return in about 3 months (around 03/25/2022) for F/U with Pre-visit Labs.  Glade Lloyd, MD Phone: [phone]  Fax: [phone]   12/23/2021, 11:24 AM

## 2022-03-04 ENCOUNTER — Other Ambulatory Visit: Payer: Self-pay | Admitting: "Endocrinology

## 2022-03-04 ENCOUNTER — Other Ambulatory Visit: Payer: Self-pay | Admitting: Family

## 2022-03-04 DIAGNOSIS — E1165 Type 2 diabetes mellitus with hyperglycemia: Secondary | ICD-10-CM

## 2022-03-04 DIAGNOSIS — I152 Hypertension secondary to endocrine disorders: Secondary | ICD-10-CM

## 2022-03-21 DIAGNOSIS — E890 Postprocedural hypothyroidism: Secondary | ICD-10-CM | POA: Diagnosis not present

## 2022-03-22 LAB — TSH: TSH: 2.21 u[IU]/mL (ref 0.450–4.500)

## 2022-03-22 LAB — T4, FREE: Free T4: 1.26 ng/dL (ref 0.82–1.77)

## 2022-03-28 ENCOUNTER — Encounter: Payer: Self-pay | Admitting: "Endocrinology

## 2022-03-28 ENCOUNTER — Ambulatory Visit: Payer: Medicaid Other | Admitting: "Endocrinology

## 2022-03-28 VITALS — BP 118/68 | HR 76 | Ht 64.0 in | Wt 204.2 lb

## 2022-03-28 DIAGNOSIS — E890 Postprocedural hypothyroidism: Secondary | ICD-10-CM

## 2022-03-28 MED ORDER — LEVOTHYROXINE SODIUM 125 MCG PO TABS: 125.0000 ug | ORAL_TABLET | Freq: Every day | ORAL | 3 refills | Status: DC

## 2022-03-28 NOTE — Progress Notes (Signed)
03/28/2022    Endocrinology follow-up note   Subjective:    Patient ID: Sherry Glass, female    DOB: 1970-06-07, PCP Junie Spencer, [name].   Past Medical History:  Diagnosis Date   Allergic rhinitis    Anxiety and depression    Asthma    pt states this doesn't really bother her anymore   Cataract    Chest pain syndrome    COPD (chronic obstructive pulmonary disease) ([facility])    Depression    Diabetes mellitus without complication ([facility])    History of kidney stones    HTN (hypertension)    Hypercholesterolemia    Hyperglycemia    Hypertension    Hypothyroidism    Obesity    Restless leg syndrome    Tobacco abuse    Past Surgical History:  Procedure Laterality Date   ABDOMINAL HYSTERECTOMY     CARDIAC CATHETERIZATION  2014   CATARACT EXTRACTION Right 2023   CATARACT EXTRACTION W/PHACO Left 06/23/2016   Procedure: CATARACT EXTRACTION PHACO AND INTRAOCULAR LENS PLACEMENT ([facility]);  Surgeon: Gemma Payor, MD;  Location: [facility];  Service: Ophthalmology;  Laterality: Left;  left - pt knows to arrive at 10:50 cde 9.11   CESAREAN SECTION     CHOLECYSTECTOMY     COLONOSCOPY WITH PROPOFOL N/A 10/14/2019   Procedure: COLONOSCOPY WITH PROPOFOL;  Surgeon: Corbin Ade, MD;  [facility];  Service: Endoscopy;  Laterality: N/A;  8:00   ESOPHAGOGASTRODUODENOSCOPY (EGD) WITH PROPOFOL N/A 02/01/2021   Procedure: ESOPHAGOGASTRODUODENOSCOPY (EGD) WITH PROPOFOL;  Surgeon: Corbin Ade, MD;  [facility];  Service: Endoscopy;  Laterality: N/A;  10:00am   EXCISION MASS NECK     cyst   left foot surgery     removal of cyst   POLYPECTOMY  10/14/2019   Procedure: POLYPECTOMY;  Surgeon: Corbin Ade, MD;  [facility];  Service: Endoscopy;;   SHOULDER ARTHROSCOPY Left 08/05/2021   Procedure: left shoulder arthroscopy, manipulation under anesthesia, rotator interval release;  Surgeon: Cammy Copa, MD;  Location: [facility];  Service: Orthopedics;   Laterality: Left;   THYROID SURGERY     Dr. Lovell Sheehan   TONSILLECTOMY     Social History   Socioeconomic History   Marital status: Widowed    Spouse name: Not on file   Number of children: 4   Years of education: Not on file   Highest education level: Not on file  Occupational History   Not on file  Tobacco Use   Smoking status: Former    Packs/day: 1.00    Years: 25.00    Total pack years: 25.00    Types: Cigarettes    Start date: 11/13/1988    Quit date: 10/28/2016    Years since quitting: 5.4   Smokeless tobacco: Never  Vaping Use   Vaping Use: Never used  Substance and Sexual Activity   Alcohol use: Not Currently    Comment: For special occasions, very rare    Drug use: No   Sexual activity: Never    Birth control/protection: Surgical  Other Topics Concern   Not on file  Social History Narrative   [place] [facility] (11/18/16):   Originally from [place] but grew up in [place]. Cared for her husband until he passed. 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 Does have significant dust exposure. Has 3 dogs currently. Previously had a parakeet as a child. No mold exposure. Enjoys playing with  her grandchildren.        Social Determinants of Health   Financial Resource Strain: Not on file  Food Insecurity: Not on file  Transportation Needs: Not on file  Physical Activity: Not on file  Stress: Not on file  Social Connections: Not on file   Outpatient Encounter Medications as of 03/28/2022  Medication Sig   albuterol (VENTOLIN HFA) 108 (90 Base) MCG/ACT inhaler Inhale 2 puffs into the lungs every 6 (six) hours as needed for wheezing or shortness of breath. (Patient not taking: Reported on 07/27/2021)   aspirin EC 81 MG tablet Take one po qd   budesonide (PULMICORT) 0.5 MG/2ML nebulizer solution Take 2 mLs (0.5 mg total) by nebulization 2 (two) times daily. (Patient taking differently: Take 0.5 mg by nebulization 2 (two) times daily as  needed (wheezing/shortness of breath.).)   cyclobenzaprine (FLEXERIL) 10 MG tablet Take 1 tablet (10 mg total) by mouth 3 (three) times daily as needed. for muscle spams   escitalopram (LEXAPRO) 20 MG tablet Take 1 tablet (20 mg total) by mouth daily.   gabapentin (NEURONTIN) 300 MG capsule Take 1 capsule (300 mg total) by mouth 3 (three) times daily.   glimepiride (AMARYL) 2 MG tablet Take 1 tablet (2 mg total) by mouth daily with breakfast. (NEEDS TO BE SEEN BEFORE NEXT REFILL)   Glycopyrrolate-Formoterol (BEVESPI AEROSPHERE) 9-4.8 MCG/ACT AERO Inhale 2 puffs into the lungs 2 (two) times daily.   ibuprofen (ADVIL) 800 MG tablet Take 1 tablet (800 mg total) by mouth 2 (two) times daily.   levothyroxine (SYNTHROID) 125 MCG tablet Take 1 tablet (125 mcg total) by mouth daily before breakfast.   losartan (COZAAR) 100 MG tablet Take 1 tablet by mouth once daily   metFORMIN (GLUCOPHAGE) 1000 MG tablet Take 1 tablet (1,000 mg total) by mouth 2 (two) times daily with a meal. (NEEDS TO BE SEEN BEFORE NEXT REFILL)   methocarbamol (ROBAXIN) 500 MG tablet Take 1 tablet (500 mg total) by mouth every 8 (eight) hours as needed for muscle spasms.   oxyCODONE (ROXICODONE) 5 MG immediate release tablet Take 1 tablet (5 mg total) by mouth every 6 (six) hours as needed for severe pain.   rosuvastatin (CRESTOR) 10 MG tablet Take 1 tablet (10 mg total) by mouth daily.   TREMFYA 100 MG/ML SOSY Inject 100 mg into the skin every 8 (eight) weeks.   TRULICITY 1.5 0000000 SOPN INJECT 1.5 MG SUBQ INTO THE SKIN EVERY SATURDAY   vitamin B-12 (CYANOCOBALAMIN) 1000 MCG tablet Take 1,000 mcg by mouth in the morning.   zinc gluconate 50 MG tablet Take 50 mg by mouth in the morning.   [DISCONTINUED] levothyroxine (SYNTHROID) 125 MCG tablet TAKE 1 TABLET BY MOUTH ONCE DAILY BEFORE BREAKFAST   No facility-administered encounter medications on file as of 03/28/2022.   ALLERGIES: Allergies  Allergen Reactions   Dilaudid  [Hydromorphone]     Itching and vomitting   Doxycycline Itching   Valproic Acid And Related Nausea And Vomiting   VACCINATION STATUS: Immunization History  Administered Date(s) Administered   Influenza,inj,Quad PF,6+ Mos 04/27/2017   Pneumococcal Polysaccharide-23 05/02/2005    HPI  Mrs. Sledge is status post radioactive iodine thyroid ablation on June 29, 2018.  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   She received RAI thyroid ablation to treat primary hyperthyroidism on June 29, 2018.  She was subsequently initiated on  Levothyroxine.  She is currently on levothyroxine 125 mcg p.o. daily before breakfast.  She reports compliance and consistency taking her medication. The patient denies family history of thyroid dysfunction- she is adopted. -She denies palpitations, tremors, nor heat intolerance. - She has well-controlled type 2 diabetes with A1c of 7.8%  following with her PMD, taking Metformin, Trulicity 1.5 mg subcutaneously weekly, metformin 1000 mg p.o. twice daily, glimepiride 2 mg p.o. daily.  - She reports that she has quit smoking after decades of heavy smoking.     Review of Systems. Limited as above   Objective:    BP 118/68   Pulse 76   Ht 5\' 4"  (1.626 m)   Wt 204 lb 3.2 oz (92.6 kg)   BMI 35.05 kg/m   Wt Readings from Last 3 Encounters:  03/28/22 204 lb 3.2 oz (92.6 kg)  12/23/21 200 lb 9.6 oz (91 kg)  11/11/21 206 lb (93.4 kg)    Physical Exam   Lipid Panel     Component Value Date/Time   CHOL 152 11/11/2021 1037   TRIG 223 (H) 11/11/2021 1037   HDL 41 11/11/2021 1037   CHOLHDL 3.7 11/11/2021 1037   LDLCALC 74 11/11/2021 1037   Recent Results (from the past 2160 hour(s))  T4, free     Status: None   Collection Time: 03/21/22 10:59 AM  Result Value Ref Range   Free T4 1.26 0.82 - 1.77 ng/dL  TSH     Status: None   Collection Time: 03/21/22 10:59 AM  Result Value Ref Range   TSH 2.210 0.450 -  4.500 uIU/mL     - Thyroid uptake and scan on 07/19/2016 was nonfocal with 25% uptake and 24 hours.  March 05, 2018 thyroid uptake and scan: FINDINGS: Normal thyroid scan.  No hot or cold nodules are identified.   4 hour I-131 uptake = 17.6% (normal 5-20%)  24 hour I-131 uptake = 30.0% (normal 10-30%)   IMPRESSION: 1. Normal thyroid scan. 2. Top-normal 24 hour iodine 131 uptake at 30%.  I-131 thyroid ablation on June 29, 2018.    Assessment & Plan:   1.  RAI induced hypothyroidism  2.  Type 2 diabetes -She is status post thyroid ablation with I-131 for significant T3 toxicosis.  Treatment was administered on June 29, 2018.   -Her previsit thyroid function tests are consistent with appropriate replacement.  She is advised to continue levothyroxine 125 mcg p.o. daily before breakfast.    - We discussed about the correct intake of her thyroid hormone, on empty stomach at fasting, with water, separated by at least 30 minutes from breakfast and other medications,  and separated by more than 4 hours from calcium, iron, multivitamins, acid reflux medications (PPIs). -Patient is made aware of the fact that thyroid hormone replacement is needed for life, dose to be adjusted by periodic monitoring of thyroid function tests.   She has type 2 diabetes with recent A1c of 7.8 %.  She is following with her PCP for diabetes management currently on metformin 1000 mg twice daily, glimepiride 2 mg p.o. daily and Trulicity 1.5 mg subcutaneously weekly.      She wishes to continue follow-up with her PCP for diabetes care.  - I advised patient to maintain close follow up with July 01, 2018, [name] for primary care needs, including diabetes care.   I spent 21 minutes in the care of the patient today including review of labs from Thyroid Function, CMP, and other relevant labs ; imaging/biopsy records (current and previous including abstractions from other facilities); face-to-face time  discussing  her lab results and symptoms, medications doses, her options of short and long term treatment based on the latest standards of care / guidelines;   and documenting the encounter.  Seward Meth  participated in the discussions, expressed understanding, and voiced agreement with the above plans.  All questions were answered to her satisfaction. she is encouraged to contact clinic should she have any questions or concerns prior to her return visit.   Follow up plan: Return for F/U with Pre-visit Labs.  Glade Lloyd, MD Phone: [phone]  Fax: [phone]   03/28/2022, 6:13 PM

## 2022-04-01 ENCOUNTER — Ambulatory Visit: Payer: Medicaid Other | Admitting: Family

## 2022-04-01 DIAGNOSIS — M5416 Radiculopathy, lumbar region: Secondary | ICD-10-CM | POA: Diagnosis not present

## 2022-04-28 ENCOUNTER — Other Ambulatory Visit: Payer: Self-pay | Admitting: Neurosurgery

## 2022-04-28 DIAGNOSIS — M5416 Radiculopathy, lumbar region: Secondary | ICD-10-CM

## 2022-05-06 ENCOUNTER — Ambulatory Visit (INDEPENDENT_AMBULATORY_CARE_PROVIDER_SITE_OTHER): Payer: Medicaid Other | Admitting: Family

## 2022-05-06 ENCOUNTER — Encounter: Payer: Self-pay | Admitting: Family

## 2022-05-06 VITALS — BP 128/70 | HR 77 | Temp 97.1°F | Ht 64.0 in | Wt 206.0 lb

## 2022-05-06 DIAGNOSIS — J441 Chronic obstructive pulmonary disease with (acute) exacerbation: Secondary | ICD-10-CM

## 2022-05-06 DIAGNOSIS — F411 Generalized anxiety disorder: Secondary | ICD-10-CM

## 2022-05-06 DIAGNOSIS — R911 Solitary pulmonary nodule: Secondary | ICD-10-CM

## 2022-05-06 DIAGNOSIS — E1165 Type 2 diabetes mellitus with hyperglycemia: Secondary | ICD-10-CM | POA: Diagnosis not present

## 2022-05-06 DIAGNOSIS — E1159 Type 2 diabetes mellitus with other circulatory complications: Secondary | ICD-10-CM | POA: Diagnosis not present

## 2022-05-06 DIAGNOSIS — E1169 Type 2 diabetes mellitus with other specified complication: Secondary | ICD-10-CM | POA: Diagnosis not present

## 2022-05-06 DIAGNOSIS — F320 Major depressive disorder, single episode, mild: Secondary | ICD-10-CM

## 2022-05-06 DIAGNOSIS — J984 Other disorders of lung: Secondary | ICD-10-CM | POA: Diagnosis not present

## 2022-05-06 DIAGNOSIS — E559 Vitamin D deficiency, unspecified: Secondary | ICD-10-CM

## 2022-05-06 DIAGNOSIS — E890 Postprocedural hypothyroidism: Secondary | ICD-10-CM | POA: Diagnosis not present

## 2022-05-06 DIAGNOSIS — Z0000 Encounter for general adult medical examination without abnormal findings: Secondary | ICD-10-CM

## 2022-05-06 DIAGNOSIS — I152 Hypertension secondary to endocrine disorders: Secondary | ICD-10-CM

## 2022-05-06 DIAGNOSIS — E785 Hyperlipidemia, unspecified: Secondary | ICD-10-CM

## 2022-05-06 DIAGNOSIS — Z0001 Encounter for general adult medical examination with abnormal findings: Secondary | ICD-10-CM

## 2022-05-06 DIAGNOSIS — K219 Gastro-esophageal reflux disease without esophagitis: Secondary | ICD-10-CM | POA: Diagnosis not present

## 2022-05-06 LAB — BAYER DCA HB A1C WAIVED: HB A1C (BAYER DCA - WAIVED): 7.2 % — ABNORMAL HIGH (ref 4.8–5.6)

## 2022-05-06 MED ORDER — ESCITALOPRAM OXALATE 20 MG PO TABS: 20.0000 mg | ORAL_TABLET | Freq: Every day | ORAL | 5 refills | Status: DC

## 2022-05-06 MED ORDER — DESVENLAFAXINE SUCCINATE ER 100 MG PO TB24: 100.0000 mg | ORAL_TABLET | Freq: Every day | ORAL | 1 refills | Status: DC

## 2022-05-06 MED ORDER — ROSUVASTATIN CALCIUM 10 MG PO TABS: 10.0000 mg | ORAL_TABLET | Freq: Every day | ORAL | 3 refills | Status: DC

## 2022-05-06 MED ORDER — LOSARTAN POTASSIUM 100 MG PO TABS: 100.0000 mg | ORAL_TABLET | Freq: Every day | ORAL | 0 refills | Status: DC

## 2022-05-06 MED ORDER — METFORMIN HCL 1000 MG PO TABS: 1000.0000 mg | ORAL_TABLET | Freq: Two times a day (BID) | ORAL | 1 refills | Status: DC

## 2022-05-06 MED ORDER — GLIMEPIRIDE 2 MG PO TABS: 2.0000 mg | ORAL_TABLET | Freq: Every day | ORAL | 1 refills | Status: DC

## 2022-05-06 MED ORDER — GABAPENTIN 300 MG PO CAPS: 300.0000 mg | ORAL_CAPSULE | Freq: Three times a day (TID) | ORAL | 2 refills | Status: DC

## 2022-05-06 MED ORDER — TRULICITY 1.5 MG/0.5ML ~~LOC~~ SOAJ: SUBCUTANEOUS | 0 refills | Status: DC

## 2022-05-06 NOTE — Progress Notes (Signed)
Subjective:    Patient ID: Sherry Glass, female    DOB: 05-05-1970, 52 y.o.   MRN: [id number]  Chief Complaint  Patient presents with   Medical Management of Chronic Issues   Pt presents to the office today for CPE and chronic follow up. Pt has seen Pulmonologist for COPD, Restrictive lung disease, and SOB, but has been cleared.  Followed by Endocrinologists  yearly for hypothyroidism. She had thyroid ablation.   She is followed by [facility] as needed for chronic back pain. Has a MRI pending.   She is morbid obese with a BMI of 35 and DM and HTN.    She went to ED on 11/07/21 that showed a lung nodule on right upper lobe and recommend repeat in 12 months. Will order today.    Also accidentally finding of sub-cm adrenal mass, Probably benign.  Diabetes She presents for her follow-up diabetic visit. She has type 2 diabetes mellitus. Pertinent negatives for hypoglycemia include no nervousness/anxiousness or sweats. Pertinent negatives for diabetes include no blurred vision, no fatigue and no foot paresthesias. Symptoms are stable. Pertinent negatives for diabetic complications include no CVA. Risk factors for coronary artery disease include dyslipidemia, diabetes mellitus, hypertension, sedentary lifestyle and post-menopausal. She is following a generally healthy diet. Her overall blood glucose range is 90-110 mg/dl.  Hypertension This is a chronic problem. The current episode started more than 1 year ago. The problem has been resolved since onset. Associated symptoms include anxiety, malaise/fatigue and palpitations. Pertinent negatives include no blurred vision, peripheral edema, shortness of breath or sweats. Risk factors for coronary artery disease include diabetes mellitus, dyslipidemia and sedentary lifestyle. The current treatment provides moderate improvement. There is no history of CVA. Identifiable causes of hypertension include a thyroid problem.  Gastroesophageal Reflux She  complains of belching and heartburn. This is a chronic problem. The current episode started more than 1 year ago. The problem occurs occasionally. Pertinent negatives include no fatigue. Risk factors include obesity. She has tried a PPI for the symptoms. The treatment provided moderate relief.  Thyroid Problem Presents for follow-up visit. Symptoms include palpitations. Patient reports no anxiety, depressed mood, diaphoresis, dry skin or fatigue. The symptoms have been stable. Her past medical history is significant for hyperlipidemia.  Hyperlipidemia This is a chronic problem. The current episode started more than 1 year ago. The problem is controlled. Recent lipid tests were reviewed and are normal. Exacerbating diseases include obesity. Pertinent negatives include no shortness of breath. Current antihyperlipidemic treatment includes statins. The current treatment provides moderate improvement of lipids. Risk factors for coronary artery disease include dyslipidemia, diabetes mellitus, hypertension, a sedentary lifestyle and post-menopausal.  Anxiety Presents for follow-up visit. Symptoms include excessive worry, irritability, palpitations and restlessness. Patient reports no depressed mood, nervous/anxious behavior or shortness of breath. Symptoms occur occasionally. The severity of symptoms is moderate.    Depression        This is a chronic problem.  Associated symptoms include irritable, restlessness, decreased interest and sad.  Associated symptoms include no fatigue, no helplessness and no hopelessness.  Past treatments include SSRIs - Selective serotonin reuptake inhibitors.  Past medical history includes thyroid problem and anxiety.       Review of Systems  Constitutional:  Positive for irritability and malaise/fatigue. Negative for diaphoresis and fatigue.  Eyes:  Negative for blurred vision.  Respiratory:  Negative for shortness of breath.   Cardiovascular:  Positive for palpitations.   Gastrointestinal:  Positive for heartburn.  Psychiatric/Behavioral:  Positive  for depression. The patient is not nervous/anxious.   All other systems reviewed and are negative.  Family History  Adopted: Yes   Social History   Socioeconomic History   Marital status: Widowed    Spouse name: Not on file   Number of children: 4   Years of education: Not on file   Highest education level: Not on file  Occupational History   Not on file  Tobacco Use   Smoking status: Former    Packs/day: 1.00    Years: 25.00    Total pack years: 25.00    Types: Cigarettes    Start date: 11/13/1988    Quit date: 10/28/2016    Years since quitting: 5.5   Smokeless tobacco: Never  Vaping Use   Vaping Use: Never used  Substance and Sexual Activity   Alcohol use: Not Currently    Comment: For special occasions, very rare    Drug use: No   Sexual activity: Never    Birth control/protection: Surgical  Other Topics Concern   Not on file  Social History Narrative   [facility] (11/18/16):   Originally from [place] but grew up in [place]. Cared for her husband until he passed. 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 Does have significant dust exposure. Has 3 dogs currently. Previously had a parakeet as a child. No mold exposure. Enjoys playing with her grandchildren.        Social Determinants of Health   Financial Resource Strain: Not on file  Food Insecurity: Not on file  Transportation Needs: Not on file  Physical Activity: Not on file  Stress: Not on file  Social Connections: Not on file        Objective:   Physical Exam Vitals reviewed.  Constitutional:      General: She is irritable. She is not in acute distress.    Appearance: She is well-developed. She is obese.  HENT:     Head: Normocephalic and atraumatic.     Right Ear: Tympanic membrane normal.     Left Ear: Tympanic membrane normal.  Eyes:     Pupils: Pupils are equal, round,  and reactive to light.  Neck:     Thyroid: No thyromegaly.  Cardiovascular:     Rate and Rhythm: Normal rate and regular rhythm.     Heart sounds: Normal heart sounds. No murmur heard. Pulmonary:     Effort: Pulmonary effort is normal. No respiratory distress.     Breath sounds: Normal breath sounds. No wheezing.  Abdominal:     General: Bowel sounds are normal. There is no distension.     Palpations: Abdomen is soft.     Tenderness: There is no abdominal tenderness.  Musculoskeletal:        General: No tenderness. Normal range of motion.     Cervical back: Normal range of motion and neck supple.  Skin:    General: Skin is warm and dry.  Neurological:     Mental Status: She is alert and oriented to person, place, and time.     Cranial Nerves: No cranial nerve deficit.     Deep Tendon Reflexes: Reflexes are normal and symmetric.  Psychiatric:        Behavior: Behavior normal.        Thought Content: Thought content normal.        Judgment: Judgment normal.       BP 128/70   Pulse 77   Temp (!) 97.1  F (36.2 C) (Temporal)   Ht 5\' 4"  (1.626 m)   Wt 206 lb (93.4 kg)   SpO2 98%   BMI 35.36 kg/m      Assessment & Plan:  Sherry Glass comes in today with chief complaint of Medical Management of Chronic Issues   Diagnosis and orders addressed:  1. GAD (generalized anxiety disorder) Will stop Lexapro and start Pristiq 100 mg  - desvenlafaxine (PRISTIQ) 100 MG 24 hr tablet; Take 1 tablet (100 mg total) by mouth daily.  Dispense: 90 tablet; Refill: 1  2. Depression, major, single episode, mild (HCC) - desvenlafaxine (PRISTIQ) 100 MG 24 hr tablet; Take 1 tablet (100 mg total) by mouth daily.  Dispense: 90 tablet; Refill: 1  3. Uncontrolled type 2 diabetes mellitus with hyperglycemia (HCC) - glimepiride (AMARYL) 2 MG tablet; Take 1 tablet (2 mg total) by mouth daily with breakfast. (NEEDS TO BE SEEN BEFORE NEXT REFILL)  Dispense: 90 tablet; Refill: 1 - metFORMIN  (GLUCOPHAGE) 1000 MG tablet; Take 1 tablet (1,000 mg total) by mouth 2 (two) times daily with a meal. (NEEDS TO BE SEEN BEFORE NEXT REFILL)  Dispense: 180 tablet; Refill: 1 - Dulaglutide (TRULICITY) 1.5 EP/3.2RJ SOPN; INJECT 1.5 MG SUBQ INTO THE SKIN EVERY SATURDAY  Dispense: 12 mL; Refill: 0  4. Hypertension associated with diabetes ([place]) - losartan (COZAAR) 100 MG tablet; Take 1 tablet (100 mg total) by mouth daily.  Dispense: 90 tablet; Refill: 0  5. Hyperlipidemia associated with type 2 diabetes mellitus (HCC) - rosuvastatin (CRESTOR) 10 MG tablet; Take 1 tablet (10 mg total) by mouth daily.  Dispense: 90 tablet; Refill: 3  6. Lung nodule  7. Solitary pulmonary nodule - CT Chest Wo Contrast; Future  8. Chronic obstructive pulmonary disease with acute exacerbation ([place])   9. Gastroesophageal reflux disease, unspecified whether esophagitis present  10. Hypothyroidism following radioiodine therapy  11. Morbid obesity ([place])  12. Restrictive lung disease  13. Vitamin D deficiency - VITAMIN D 25 Hydroxy (Vit-D Deficiency, Fractures)  14. Annual physical exam  - CBC with Differential/Platelet - CMP14+EGFR - Lipid panel - Bayer DCA Hb A1c Waived   Labs pending Stop lexapro and start [place] Maintenance reviewed Diet and exercise encouraged  Follow up plan: 3 months    Evelina Dun, [name]

## 2022-05-06 NOTE — Patient Instructions (Signed)

## 2022-05-07 LAB — CMP14+EGFR
ALT: 25 IU/L (ref 0–32)
AST: 19 IU/L (ref 0–40)
Albumin/Globulin Ratio: 1.9 (ref 1.2–2.2)
Albumin: 4.6 g/dL (ref 3.8–4.9)
Alkaline Phosphatase: 88 IU/L (ref 44–121)
BUN/Creatinine Ratio: 20 (ref 9–23)
BUN: 14 mg/dL (ref 6–24)
Bilirubin Total: 0.7 mg/dL (ref 0.0–1.2)
CO2: 23 mmol/L (ref 20–29)
Calcium: 9.9 mg/dL (ref 8.7–10.2)
Chloride: 104 mmol/L (ref 96–106)
Creatinine, Ser: 0.69 mg/dL (ref 0.57–1.00)
Globulin, Total: 2.4 g/dL (ref 1.5–4.5)
Glucose: 267 mg/dL — ABNORMAL HIGH (ref 70–99)
Potassium: 4.5 mmol/L (ref 3.5–5.2)
Sodium: 141 mmol/L (ref 134–144)
Total Protein: 7 g/dL (ref 6.0–8.5)
eGFR: 104 mL/min/{1.73_m2} (ref >59)

## 2022-05-07 LAB — CBC WITH DIFFERENTIAL/PLATELET
Basophils Absolute: 0.1 10*3/uL (ref 0.0–0.2)
Basos: 1 %
EOS (ABSOLUTE): 0.2 10*3/uL (ref 0.0–0.4)
Eos: 2 %
Hematocrit: 41.7 % (ref 34.0–46.6)
Hemoglobin: 13.7 g/dL (ref 11.1–15.9)
Immature Grans (Abs): 0 10*3/uL (ref 0.0–0.1)
Immature Granulocytes: 0 %
Lymphocytes Absolute: 3.4 10*3/uL — ABNORMAL HIGH (ref 0.7–3.1)
Lymphs: 38 %
MCH: 31.7 pg (ref 26.6–33.0)
MCHC: 32.9 g/dL (ref 31.5–35.7)
MCV: 97 fL (ref 79–97)
Monocytes Absolute: 0.6 10*3/uL (ref 0.1–0.9)
Monocytes: 7 %
Neutrophils Absolute: 4.7 10*3/uL (ref 1.4–7.0)
Neutrophils: 52 %
Platelets: 293 10*3/uL (ref 150–450)
RBC: 4.32 x10E6/uL (ref 3.77–5.28)
RDW: 12.7 % (ref 11.7–15.4)
WBC: 8.9 10*3/uL (ref 3.4–10.8)

## 2022-05-07 LAB — LIPID PANEL
Chol/HDL Ratio: 3.2 ratio (ref 0.0–4.4)
Cholesterol, Total: 150 mg/dL (ref 100–199)
HDL: 47 mg/dL (ref >39)
LDL Chol Calc (NIH): 55 mg/dL (ref 0–99)
Triglycerides: 310 mg/dL — ABNORMAL HIGH (ref 0–149)
VLDL Cholesterol Cal: 48 mg/dL — ABNORMAL HIGH (ref 5–40)

## 2022-05-07 LAB — VITAMIN D 25 HYDROXY (VIT D DEFICIENCY, FRACTURES): Vit D, 25-Hydroxy: 20.1 ng/mL — ABNORMAL LOW (ref 30.0–100.0)

## 2022-05-09 ENCOUNTER — Other Ambulatory Visit: Payer: Self-pay | Admitting: Family

## 2022-05-09 MED ORDER — VITAMIN D (ERGOCALCIFEROL) 1.25 MG (50000 UNIT) PO CAPS: 50000.0000 [IU] | ORAL_CAPSULE | ORAL | 3 refills | Status: AC

## 2022-05-22 ENCOUNTER — Ambulatory Visit
Admission: RE | Admit: 2022-05-22 | Discharge: 2022-05-22 | Disposition: A | Discharge status: Home / Self Care | Payer: Medicaid Other | Source: Ambulatory Visit | Attending: Neurosurgery | Admitting: Neurosurgery

## 2022-05-22 DIAGNOSIS — R200 Anesthesia of skin: Secondary | ICD-10-CM | POA: Diagnosis not present

## 2022-05-22 DIAGNOSIS — M545 Low back pain, unspecified: Secondary | ICD-10-CM | POA: Diagnosis not present

## 2022-05-22 DIAGNOSIS — R29898 Other symptoms and signs involving the musculoskeletal system: Secondary | ICD-10-CM | POA: Diagnosis not present

## 2022-05-22 DIAGNOSIS — M5416 Radiculopathy, lumbar region: Secondary | ICD-10-CM

## 2022-05-24 ENCOUNTER — Other Ambulatory Visit: Payer: Self-pay | Admitting: Family

## 2022-05-24 DIAGNOSIS — Z1231 Encounter for screening mammogram for malignant neoplasm of breast: Secondary | ICD-10-CM

## 2022-05-28 DIAGNOSIS — L089 Local infection of the skin and subcutaneous tissue, unspecified: Secondary | ICD-10-CM | POA: Diagnosis not present

## 2022-05-28 DIAGNOSIS — B9689 Other specified bacterial agents as the cause of diseases classified elsewhere: Secondary | ICD-10-CM | POA: Diagnosis not present

## 2022-05-30 ENCOUNTER — Ambulatory Visit
Admission: RE | Admit: 2022-05-30 | Discharge: 2022-05-30 | Disposition: A | Discharge status: Home / Self Care | Payer: Medicaid Other | Source: Ambulatory Visit | Attending: Family | Admitting: Family

## 2022-05-30 DIAGNOSIS — Z1231 Encounter for screening mammogram for malignant neoplasm of breast: Secondary | ICD-10-CM

## 2022-06-01 ENCOUNTER — Ambulatory Visit: Payer: Medicaid Other | Admitting: Family Medicine

## 2022-06-03 DIAGNOSIS — Z6835 Body mass index (BMI) 35.0-35.9, adult: Secondary | ICD-10-CM | POA: Diagnosis not present

## 2022-06-03 DIAGNOSIS — M47816 Spondylosis without myelopathy or radiculopathy, lumbar region: Secondary | ICD-10-CM | POA: Diagnosis not present

## 2022-06-23 ENCOUNTER — Ambulatory Visit (HOSPITAL_COMMUNITY)
Admission: RE | Admit: 2022-06-23 | Discharge: 2022-06-23 | Disposition: A | Discharge status: Home / Self Care | Payer: Medicaid Other | Source: Ambulatory Visit | Attending: Family | Admitting: Family

## 2022-06-23 DIAGNOSIS — R911 Solitary pulmonary nodule: Secondary | ICD-10-CM | POA: Insufficient documentation

## 2022-06-30 ENCOUNTER — Encounter: Payer: Self-pay | Admitting: Radiology

## 2022-08-31 DIAGNOSIS — L400 Psoriasis vulgaris: Secondary | ICD-10-CM | POA: Diagnosis not present

## 2022-08-31 DIAGNOSIS — L4050 Arthropathic psoriasis, unspecified: Secondary | ICD-10-CM | POA: Diagnosis not present

## 2022-09-01 ENCOUNTER — Encounter: Payer: Self-pay | Admitting: *Deleted

## 2022-09-01 DIAGNOSIS — R531 Weakness: Secondary | ICD-10-CM | POA: Diagnosis not present

## 2022-09-01 DIAGNOSIS — Z79899 Other long term (current) drug therapy: Secondary | ICD-10-CM | POA: Diagnosis not present

## 2022-09-01 DIAGNOSIS — L4050 Arthropathic psoriasis, unspecified: Secondary | ICD-10-CM | POA: Diagnosis not present

## 2022-09-01 DIAGNOSIS — L400 Psoriasis vulgaris: Secondary | ICD-10-CM | POA: Diagnosis not present

## 2022-09-08 ENCOUNTER — Ambulatory Visit: Payer: Medicaid Other | Admitting: Family

## 2022-09-08 NOTE — Progress Notes (Signed)
Referring Provider: Junie Spencer, [name] Primary Care Physician:  Junie Spencer, [name] Primary GI Physician: Dr. Jena Gauss  Chief Complaint  Patient presents with   Follow-up    Follow up on GERD and Hepatic steatosis    HPI:   Sherry Glass is a 52 y.o. female with history of fatty liver, elevated ALT with negative viral hepatitis panel, chronic diarrhea, N/V/epigastric pain with normal EGD and gastric emptying study in 2022, PPI changed from pantoprazole to Nexium twice daily, presenting today for routine follow-up.  Patient has not been seen since September 2022.   Today:  Weight fluctuates.   Every now and then has burps that are like rotten eggs. About once a week. Heartburn 2-3 times a week. Nausea/vomiting 2-3 times a week. Can be related to heartburn or sometimes not. No specific food triggers.   No brbpr or melena.   Having diarrhea. Normally she will have 6 BMs a day. Sometimes more and sometimes less. No abdominal pain. This is chronic. No change. No watery stools. Some nocturnal stools. Can't remember If diarrhea started before or after metformin. Not taking anything. Diarrhea worsened after having gallbladder removed.    Most recent labs 05/06/2022 with LFTs within normal limits, hemoglobin within normal limits. Fib four 0.69-advanced fibrosis excluded (based on labs in January).  Colonoscopy October 14, 2019: 2 sessile polyps in the sigmoid colon from 4 to 5 mm in size, scattered small bowel diverticula in the sigmoid colon. Surgical pathology found the polyps to be hyperplastic. Recommended repeat colonoscopy in 10 years.   Past Medical History:  Diagnosis Date   Allergic rhinitis    Anxiety and depression    Asthma    pt states this doesn't really bother her anymore   Cataract    Chest pain syndrome    COPD (chronic obstructive pulmonary disease) (HCC)    Depression    Diabetes mellitus without complication (HCC)    History of kidney stones    HTN (hypertension)     Hypercholesterolemia    Hyperglycemia    Hypertension    Hypothyroidism    N&V (nausea and vomiting) 01/08/2021   Obesity    Restless leg syndrome    Tobacco abuse     Past Surgical History:  Procedure Laterality Date   ABDOMINAL HYSTERECTOMY     CARDIAC CATHETERIZATION  2014   CATARACT EXTRACTION Right 2023   CATARACT EXTRACTION W/PHACO Left 06/23/2016   Procedure: CATARACT EXTRACTION PHACO AND INTRAOCULAR LENS PLACEMENT (IOC);  Surgeon: Gemma Payor, MD;  Location: [facility];  Service: Ophthalmology;  Laterality: Left;  left - pt knows to arrive at 10:50 cde 9.11   CESAREAN SECTION     CHOLECYSTECTOMY     COLONOSCOPY WITH PROPOFOL N/A 10/14/2019   Procedure: COLONOSCOPY WITH PROPOFOL;  Surgeon: Corbin Ade, MD;  Location: [facility];  Service: Endoscopy;  Laterality: N/A;  8:00   ESOPHAGOGASTRODUODENOSCOPY (EGD) WITH PROPOFOL N/A 02/01/2021   Surgeon: Corbin Ade, MD; normal exam   EXCISION MASS NECK     cyst   left foot surgery     removal of cyst   POLYPECTOMY  10/14/2019   Procedure: POLYPECTOMY;  Surgeon: Corbin Ade, MD;  Location: [facility];  Service: Endoscopy;;   SHOULDER ARTHROSCOPY Left 08/05/2021   Procedure: left shoulder arthroscopy, manipulation under anesthesia, rotator interval release;  Surgeon: Cammy Copa, MD;  Location: [facility];  Service: Orthopedics;  Laterality: Left;   THYROID SURGERY  Dr. Lovell Sheehan   TONSILLECTOMY      Current Outpatient Medications  Medication Sig Dispense Refill   albuterol (VENTOLIN HFA) 108 (90 Base) MCG/ACT inhaler Inhale 2 puffs into the lungs every 6 (six) hours as needed for wheezing or shortness of breath. 8 g 2   aspirin EC 81 MG tablet Take one po qd 90 tablet 1   budesonide (PULMICORT) 0.5 MG/2ML nebulizer solution Take 2 mLs (0.5 mg total) by nebulization 2 (two) times daily. (Patient taking differently: Take 0.5 mg by nebulization 2 (two) times daily as needed (wheezing/shortness of breath.).)  120 mL 3   cyclobenzaprine (FLEXERIL) 10 MG tablet Take 1 tablet (10 mg total) by mouth 3 (three) times daily as needed. for muscle spams 90 tablet 1   desvenlafaxine (PRISTIQ) 100 MG 24 hr tablet Take 1 tablet (100 mg total) by mouth daily. 90 tablet 1   Dulaglutide (TRULICITY) 1.5 MG/0.5ML SOPN INJECT 1.5 MG SUBQ INTO THE SKIN EVERY SATURDAY 12 mL 0   esomeprazole (NEXIUM) 40 MG capsule Take 1 capsule (40 mg total) by mouth daily before breakfast. 30 capsule 3   gabapentin (NEURONTIN) 300 MG capsule Take 1 capsule (300 mg total) by mouth 3 (three) times daily. 90 capsule 2   glimepiride (AMARYL) 2 MG tablet Take 1 tablet (2 mg total) by mouth daily with breakfast. (NEEDS TO BE SEEN BEFORE NEXT REFILL) 90 tablet 1   Glycopyrrolate-Formoterol (BEVESPI AEROSPHERE) 9-4.8 MCG/ACT AERO Inhale 2 puffs into the lungs 2 (two) times daily. 5.9 g 0   ibuprofen (ADVIL) 800 MG tablet Take 1 tablet (800 mg total) by mouth 2 (two) times daily. 30 tablet 0   levothyroxine (SYNTHROID) 125 MCG tablet Take 1 tablet (125 mcg total) by mouth daily before breakfast. 95 tablet 3   losartan (COZAAR) 100 MG tablet Take 1 tablet (100 mg total) by mouth daily. 90 tablet 0   metFORMIN (GLUCOPHAGE) 1000 MG tablet Take 1 tablet (1,000 mg total) by mouth 2 (two) times daily with a meal. (NEEDS TO BE SEEN BEFORE NEXT REFILL) 180 tablet 1   rosuvastatin (CRESTOR) 10 MG tablet Take 1 tablet (10 mg total) by mouth daily. 90 tablet 3   TREMFYA 100 MG/ML SOSY Inject 100 mg into the skin every 8 (eight) weeks.     vitamin B-12 (CYANOCOBALAMIN) 1000 MCG tablet Take 1,000 mcg by mouth in the morning.     Vitamin D, Ergocalciferol, (DRISDOL) 1.25 MG (50000 UNIT) CAPS capsule Take 1 capsule (50,000 Units total) by mouth every 7 (seven) days. 12 capsule 3   zinc gluconate 50 MG tablet Take 50 mg by mouth in the morning.     No current facility-administered medications for this visit.    Allergies as of 09/09/2022 - Review Complete  09/09/2022  Allergen Reaction Noted   Dilaudid [hydromorphone]  05/13/2016   Doxycycline Itching 07/27/2021   Valproic acid and related Nausea And Vomiting 07/03/2012    Family History  Adopted: Yes  Problem Relation Age of Onset   Breast cancer Neg Hx     Social History   Socioeconomic History   Marital status: Widowed    Spouse name: Not on file   Number of children: 4   Years of education: Not on file   Highest education level: Not on file  Occupational History   Not on file  Tobacco Use   Smoking status: Former    Packs/day: 1.00    Years: 25.00    Additional pack years: 0.00  Total pack years: 25.00    Types: Cigarettes    Start date: 11/13/1988    Quit date: 10/28/2016    Years since quitting: 5.8   Smokeless tobacco: Never  Vaping Use   Vaping Use: Never used  Substance and Sexual Activity   Alcohol use: Not Currently    Comment: For special occasions, very rare    Drug use: No   Sexual activity: Never    Birth control/protection: Surgical  Other Topics Concern   Not on file  Social History Narrative   [facility] (11/18/16):   Originally from [place] but grew up in [place]. Cared for her husband until he passed. 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 Does have significant dust exposure. Has 3 dogs currently. Previously had a parakeet as a child. No mold exposure. Enjoys playing with her grandchildren.        Social Determinants of Health   Financial Resource Strain: Not on file  Food Insecurity: Not on file  Transportation Needs: Not on file  Physical Activity: Not on file  Stress: Not on file  Social Connections: Not on file    Review of Systems: Gen: Denies fever, chills, cold or flulike symptoms, presyncope, syncope. CV: Denies chest pain, palpitations. Resp: Admits to chronic shortness of breath, on 2 L O2 at nighttime. GI: See HPI Heme: See HPI  Physical Exam: BP 123/76   Pulse 78   Temp  97.6 F (36.4 C)   Ht 5\' 4"  (1.626 m)   Wt 211 lb 9.6 oz (96 kg)   BMI 36.32 kg/m  General:   Alert and oriented. No distress noted. Pleasant and cooperative.  Head:  Normocephalic and atraumatic. Eyes:  Conjuctiva clear without scleral icterus. Heart:  S1, S2 present without murmurs appreciated. Lungs:  Clear to auscultation bilaterally. No wheezes, rales, or rhonchi. No distress.  Abdomen:  +BS, soft, non-tender and non-distended. No rebound or guarding. No HSM or masses noted. Msk:  Symmetrical without gross deformities. Normal posture. Extremities:  Without edema. Neurologic:  Alert and  oriented x4 Psych:  Normal mood and affect.    Assessment:  52 year old female with history of fatty liver, elevated ALT previously with negative viral hepatitis panel, chronic diarrhea, GERD, chronic nausea/vomiting, presenting today for follow-up.  Hepatic steatosis: Most recent LFTs in January within normal limits.  Fib four 0.69 excluding advanced fibrosis.  Hemoglobin A1c was 7.2.  BMI remains elevated at 36.32.  She was counseled on fatty liver today with the importance of weight loss through diet and exercise.  Also counseled on the importance of tight glycemic control.  Chronic diarrhea: Having about 6 bowel movements daily, sometimes more, sometimes less.  Stools are loose, but not watery.  Admits to intermittent nocturnal stools.  Prior colonoscopy in June 2021 with normal-appearing colonic mucosa, hyperplastic polyps removed.  She does not remember if diarrhea was present before or after starting metformin.  She does remember diarrhea of became worse after cholecystectomy.  She denies any abdominal pain.   Etiology may be multifactorial.  Possibly bile salt diarrhea, medication effect in the setting of metformin.  Not consistent with IBS.  In light of nocturnal stools, multiple stools daily, I recommended ruling out infectious etiology.  Will also screen for celiac disease.  If stool  studies and celiac screen are negative, can consider trial of cholestyramine.  GERD: Not adequately managed as she is not on any medications at this time.  Reports pantoprazole previously made  her sick.  Will start Nexium 40 mg daily.  Nausea/vomiting: Chronic.  Intermittent.  Prior EGD and gastric emptying study in 2022 for the same with normal exams.  Symptoms may be secondary to uncontrolled GERD.  We are starting her on Nexium 40 mg daily.  Plan:  IgA, TTG IgA, C. difficile GDH and toxin A/B, GI path panel, Cryptosporidium, Giardia. Start Nexium 40 mg daily.  Prescription sent to pharmacy. Counseled on GERD diet/lifestyle.  Written instructions provided on AVS. Counseled on hepatic steatosis with recommendations for weight loss through diet and exercise.  Written instructions provided on AVS. Follow-up in 3 months or sooner if needed.   Ermalinda Memos, PA-C [facility] 09/09/2022

## 2022-09-09 ENCOUNTER — Ambulatory Visit: Payer: Medicaid Other | Admitting: Gastroenterology

## 2022-09-09 ENCOUNTER — Encounter: Payer: Self-pay | Admitting: Gastroenterology

## 2022-09-09 VITALS — BP 123/76 | HR 78 | Temp 97.6°F | Ht 64.0 in | Wt 211.6 lb

## 2022-09-09 DIAGNOSIS — R112 Nausea with vomiting, unspecified: Secondary | ICD-10-CM | POA: Diagnosis not present

## 2022-09-09 DIAGNOSIS — K760 Fatty (change of) liver, not elsewhere classified: Secondary | ICD-10-CM

## 2022-09-09 DIAGNOSIS — R197 Diarrhea, unspecified: Secondary | ICD-10-CM

## 2022-09-09 DIAGNOSIS — K219 Gastro-esophageal reflux disease without esophagitis: Secondary | ICD-10-CM

## 2022-09-09 MED ORDER — ESOMEPRAZOLE MAGNESIUM 40 MG PO CPDR: 40.0000 mg | DELAYED_RELEASE_CAPSULE | Freq: Every day | ORAL | 3 refills | Status: DC

## 2022-09-09 NOTE — Patient Instructions (Signed)
Please have blood work and stool studies completed at [facility].  Start Nexium 40 mg daily for reflux.  Follow a GERD diet:  Avoid fried, fatty, greasy, spicy, citrus foods. Avoid caffeine and carbonated beverages. Avoid chocolate. Try eating 4-6 small meals a day rather than 3 large meals. Do not eat within 3 hours of laying down. Prop head of bed up on wood or bricks to create a 6 inch incline.  Instructions for fatty liver: Recommend 1-2# weight loss per week until ideal body weight through exercise & diet. Low fat/cholesterol diet.   Avoid sweets, sodas, fruit juices, sweetened beverages like tea, etc. Gradually increase exercise from 15 min daily up to 1 hr per day 5 days/week. Limit alcohol use.  Will follow-up with you in 3 months or sooner if needed.  It was nice to meet you today!  Ermalinda Memos, PA-C [facility]

## 2022-09-22 ENCOUNTER — Encounter: Payer: Self-pay | Admitting: Family

## 2022-09-22 ENCOUNTER — Ambulatory Visit: Payer: Medicaid Other | Admitting: Family

## 2022-09-22 VITALS — BP 136/64 | HR 71 | Temp 97.6°F | Ht 64.0 in | Wt 209.0 lb

## 2022-09-22 DIAGNOSIS — I152 Hypertension secondary to endocrine disorders: Secondary | ICD-10-CM | POA: Diagnosis not present

## 2022-09-22 DIAGNOSIS — E785 Hyperlipidemia, unspecified: Secondary | ICD-10-CM | POA: Diagnosis not present

## 2022-09-22 DIAGNOSIS — E890 Postprocedural hypothyroidism: Secondary | ICD-10-CM | POA: Diagnosis not present

## 2022-09-22 DIAGNOSIS — J441 Chronic obstructive pulmonary disease with (acute) exacerbation: Secondary | ICD-10-CM

## 2022-09-22 DIAGNOSIS — E1159 Type 2 diabetes mellitus with other circulatory complications: Secondary | ICD-10-CM

## 2022-09-22 DIAGNOSIS — E1169 Type 2 diabetes mellitus with other specified complication: Secondary | ICD-10-CM

## 2022-09-22 DIAGNOSIS — F411 Generalized anxiety disorder: Secondary | ICD-10-CM | POA: Diagnosis not present

## 2022-09-22 DIAGNOSIS — F320 Major depressive disorder, single episode, mild: Secondary | ICD-10-CM

## 2022-09-22 DIAGNOSIS — M25552 Pain in left hip: Secondary | ICD-10-CM | POA: Diagnosis not present

## 2022-09-22 DIAGNOSIS — M5440 Lumbago with sciatica, unspecified side: Secondary | ICD-10-CM

## 2022-09-22 DIAGNOSIS — G8929 Other chronic pain: Secondary | ICD-10-CM | POA: Insufficient documentation

## 2022-09-22 LAB — CMP14+EGFR
ALT: 37 IU/L — ABNORMAL HIGH (ref 0–32)
AST: 29 IU/L (ref 0–40)
Albumin/Globulin Ratio: 1.8 (ref 1.2–2.2)
Albumin: 4.6 g/dL (ref 3.8–4.9)
Alkaline Phosphatase: 100 IU/L (ref 44–121)
BUN/Creatinine Ratio: 12 (ref 9–23)
BUN: 10 mg/dL (ref 6–24)
Bilirubin Total: 0.6 mg/dL (ref 0.0–1.2)
CO2: 19 mmol/L — ABNORMAL LOW (ref 20–29)
Calcium: 10 mg/dL (ref 8.7–10.2)
Chloride: 104 mmol/L (ref 96–106)
Creatinine, Ser: 0.81 mg/dL (ref 0.57–1.00)
Globulin, Total: 2.5 g/dL (ref 1.5–4.5)
Glucose: 166 mg/dL — ABNORMAL HIGH (ref 70–99)
Potassium: 4.8 mmol/L (ref 3.5–5.2)
Sodium: 139 mmol/L (ref 134–144)
Total Protein: 7.1 g/dL (ref 6.0–8.5)
eGFR: 87 mL/min/{1.73_m2} (ref >59)

## 2022-09-22 LAB — BAYER DCA HB A1C WAIVED: HB A1C (BAYER DCA - WAIVED): 8 % — ABNORMAL HIGH (ref 4.8–5.6)

## 2022-09-22 MED ORDER — CYCLOBENZAPRINE HCL 10 MG PO TABS
10.0000 mg | ORAL_TABLET | Freq: Three times a day (TID) | ORAL | 1 refills | Status: DC | PRN
Start: 2022-09-22 — End: 2023-03-14

## 2022-09-22 MED ORDER — IBUPROFEN 800 MG PO TABS: 800.0000 mg | ORAL_TABLET | Freq: Two times a day (BID) | ORAL | 1 refills | Status: DC

## 2022-09-22 MED ORDER — GLIMEPIRIDE 2 MG PO TABS: 2.0000 mg | ORAL_TABLET | Freq: Every day | ORAL | 1 refills | Status: DC

## 2022-09-22 NOTE — Patient Instructions (Signed)
Hip Pain The hip is the joint between the upper legs and the lower pelvis. The bones, cartilage, tendons, and muscles of your hip joint support your body and allow you to move around. Hip pain can range from a minor ache to severe pain in one or both of your hips. The pain may be felt on the inside of the hip joint near the groin, or on the outside near the buttocks and upper thigh. You may also have swelling or stiffness in your hip area. Follow these instructions at home: Managing pain, stiffness, and swelling     If told, put ice on the painful area. Put ice in a plastic bag. Place a towel between your skin and the bag. Leave the ice on for 20 minutes, 2-3 times a day. If told, apply heat to the affected area as often as told by your health care provider. Use the heat source that your provider recommends, such as a moist heat pack or a heating pad. Place a towel between your skin and the heat source. Leave the heat on for 20-30 minutes. If your skin turns bright red, remove the ice or heat right away to prevent skin damage. The risk of damage is higher if you cannot feel pain, heat, or cold. Activity Do exercises as told by your provider. Avoid activities that cause pain. General instructions  Take over-the-counter and prescription medicines only as told by your provider. Keep a journal of your symptoms. Write down: How often you have hip pain. The location of your pain. What the pain feels like. What makes the pain worse. Sleep with a pillow between your legs on your most comfortable side. Keep all follow-up visits. Your provider will monitor your pain and activity. Contact a health care provider if: You cannot put weight on your leg. Your pain or swelling gets worse after a week. It gets harder to walk. You have a fever. Get help right away if: You fall. You have a sudden increase in pain and swelling in your hip. Your hip is red or swollen or very tender to touch. This  information is not intended to replace advice given to you by your health care provider. Make sure you discuss any questions you have with your health care provider. Document Revised: 12/21/2021 Document Reviewed: 12/21/2021 Elsevier Patient Education  2023 [employer]  

## 2022-09-22 NOTE — Progress Notes (Signed)
Subjective:    Patient ID: Sherry Glass, female    DOB: November 18, 1970, 52 y.o.   MRN: [id number]  Chief Complaint  Patient presents with   Medical Management of Chronic Issues    Low back pain years. Wants another referral. Left hip pain. Was seeing someone wants a new referral    Cyst    Popping up on legs not sore notice for the last 6 mths.   Pt presents to the office today for chronic follow up. Pt has seen Pulmonologist for COPD, Restrictive lung disease, and SOB, but has been cleared.  Followed by Endocrinologists  yearly for hypothyroidism. She had thyroid ablation.   She is followed by [facility] as needed for chronic back pain. Had a MRI 05/22/22. Continues to have bilateral leg numbness and pain. Reports she has fallen 8 times in the last year because of this. Wants second opinion because was told they could do anything.   She is morbid obese with a BMI of 35 and DM and HTN.    She went to ED on 11/07/21 that showed a lung nodule on right upper lobe and recommend repeat in 12 months. Will order today.    Hypertension This is a chronic problem. The current episode started more than 1 year ago. The problem has been resolved since onset. The problem is controlled. Associated symptoms include anxiety and malaise/fatigue. Pertinent negatives include no blurred vision, peripheral edema or shortness of breath. Risk factors for coronary artery disease include dyslipidemia and sedentary lifestyle. The current treatment provides moderate improvement. Identifiable causes of hypertension include a thyroid problem.  Gastroesophageal Reflux She complains of belching, heartburn and a hoarse voice. This is a chronic problem. The current episode started more than 1 year ago. The problem occurs occasionally. Pertinent negatives include no fatigue. Risk factors include obesity. She has tried a PPI for the symptoms. The treatment provided moderate relief.  Diabetes She presents for her follow-up  diabetic visit. She has type 2 diabetes mellitus. Hypoglycemia symptoms include nervousness/anxiousness. Pertinent negatives for diabetes include no blurred vision, no fatigue and no foot paresthesias. Symptoms are stable. Risk factors for coronary artery disease include dyslipidemia, diabetes mellitus, hypertension and sedentary lifestyle. She is following a generally unhealthy diet. Her overall blood glucose range is 140-180 mg/dl. Eye exam is current.  Thyroid Problem Presents for follow-up visit. Symptoms include anxiety and hoarse voice. Patient reports no constipation, depressed mood, diarrhea or fatigue. The symptoms have been stable. Her past medical history is significant for hyperlipidemia.  Hyperlipidemia This is a chronic problem. The current episode started more than 1 year ago. The problem is controlled. Recent lipid tests were reviewed and are normal. Exacerbating diseases include obesity. Associated symptoms include leg pain. Pertinent negatives include no shortness of breath. Current antihyperlipidemic treatment includes statins. The current treatment provides moderate improvement of lipids. Risk factors for coronary artery disease include dyslipidemia, diabetes mellitus, hypertension, a sedentary lifestyle and post-menopausal.  Anxiety Presents for follow-up visit. Symptoms include excessive worry, irritability, nervous/anxious behavior and restlessness. Patient reports no depressed mood or shortness of breath. Symptoms occur occasionally. The severity of symptoms is mild.    Depression        This is a chronic problem.  The current episode started more than 1 year ago.   The onset quality is gradual.   The problem occurs intermittently.  Associated symptoms include restlessness and sad.  Associated symptoms include no fatigue, no helplessness and no hopelessness.  Past treatments include  SNRIs - Serotonin and norepinephrine reuptake inhibitors.  Past medical history includes thyroid  problem and anxiety.   Hip Pain  The incident occurred more than 1 week ago. There was no injury mechanism. The pain is present in the left hip. The quality of the pain is described as aching. The pain is at a severity of 8/10. The pain is moderate. Associated symptoms include numbness and tingling. She reports no foreign bodies present. The symptoms are aggravated by weight bearing.  Back Pain This is a chronic problem. The current episode started more than 1 year ago. The problem occurs intermittently. The problem has been waxing and waning since onset. The pain is present in the lumbar spine. The quality of the pain is described as aching. The pain is moderate. Associated symptoms include leg pain, numbness and tingling. She has tried NSAIDs and muscle relaxant for the symptoms. The treatment provided mild relief.      Review of Systems  Constitutional:  Positive for irritability and malaise/fatigue. Negative for fatigue.  HENT:  Positive for hoarse voice.   Eyes:  Negative for blurred vision.  Respiratory:  Negative for shortness of breath.   Gastrointestinal:  Positive for heartburn. Negative for constipation and diarrhea.  Musculoskeletal:  Positive for back pain.  Neurological:  Positive for tingling and numbness.  Psychiatric/Behavioral:  Positive for depression. The patient is nervous/anxious.   All other systems reviewed and are negative.      Objective:   Physical Exam Vitals reviewed.  Constitutional:      General: She is not in acute distress.    Appearance: She is well-developed. She is obese.  HENT:     Head: Normocephalic and atraumatic.     Right Ear: Tympanic membrane normal.     Left Ear: Tympanic membrane normal.  Eyes:     Pupils: Pupils are equal, round, and reactive to light.  Neck:     Thyroid: No thyromegaly.  Cardiovascular:     Rate and Rhythm: Normal rate and regular rhythm.     Heart sounds: Normal heart sounds. No murmur heard. Pulmonary:      Effort: Pulmonary effort is normal. No respiratory distress.     Breath sounds: Normal breath sounds. No wheezing.  Abdominal:     General: Bowel sounds are normal. There is no distension.     Palpations: Abdomen is soft.     Tenderness: There is no abdominal tenderness.  Musculoskeletal:        General: No tenderness.     Cervical back: Normal range of motion and neck supple.     Comments: Pain in lumbar with flexion, pain in left hip with internal rotation  Skin:    General: Skin is warm and dry.  Neurological:     Mental Status: She is alert and oriented to person, place, and time.     Cranial Nerves: No cranial nerve deficit.     Deep Tendon Reflexes: Reflexes are normal and symmetric.  Psychiatric:        Behavior: Behavior normal.        Thought Content: Thought content normal.        Judgment: Judgment normal.       BP 136/64   Pulse 71   Temp 97.6 F (36.4 C) (Temporal)   Ht 5\' 4"  (1.626 m)   Wt 209 lb (94.8 kg)   SpO2 93%   BMI 35.87 kg/m      Assessment & Plan:  Sherry Glass comes  in today with chief complaint of Medical Management of Chronic Issues (Low back pain years. Wants another referral. Left hip pain. Was seeing someone wants a new referral ) and Cyst (Popping up on legs not sore notice for the last 6 mths.)   Diagnosis and orders addressed:  1. Chronic bilateral low back pain with sciatica, sciatica laterality unspecified Referral to ortho pending  Continue NSAID BID with food - cyclobenzaprine (FLEXERIL) 10 MG tablet; Take 1 tablet (10 mg total) by mouth 3 (three) times daily as needed. for muscle spams  Dispense: 90 tablet; Refill: 1 - ibuprofen (ADVIL) 800 MG tablet; Take 1 tablet (800 mg total) by mouth 2 (two) times daily.  Dispense: 180 tablet; Refill: 1 - Ambulatory referral to Orthopedic Surgery - CMP14+EGFR  2. Type 2 diabetes mellitus with other specified complication, without long-term current use of insulin (HCC) - glimepiride (AMARYL)  2 MG tablet; Take 1 tablet (2 mg total) by mouth daily with breakfast.  Dispense: 90 tablet; Refill: 1 - CMP14+EGFR - Bayer DCA Hb A1c Waived  3. Chronic obstructive pulmonary disease with acute exacerbation (HCC) - CMP14+EGFR  4. Depression, major, single episode, mild (HCC) - CMP14+EGFR  5. GAD (generalized anxiety disorder) - CMP14+EGFR  6. Hyperlipidemia associated with type 2 diabetes mellitus (HCC) - CMP14+EGFR  7. Hypertension associated with diabetes (HCC) - CMP14+EGFR  8. Hypothyroidism following radioiodine therapy - CMP14+EGFR  9. Morbid obesity (HCC)  - CMP14+EGFR  10. Left hip pain - ibuprofen (ADVIL) 800 MG tablet; Take 1 tablet (800 mg total) by mouth 2 (two) times daily.  Dispense: 180 tablet; Refill: 1 - Ambulatory referral to Orthopedic Surgery - CMP14+EGFR   Labs pending Health Maintenance reviewed Diet and exercise encouraged  Follow up plan: 4 months   Jannifer Rodney, [name]

## 2022-09-23 ENCOUNTER — Other Ambulatory Visit: Payer: Self-pay | Admitting: Family

## 2022-09-23 LAB — MICROALBUMIN / CREATININE URINE RATIO
Creatinine, Urine: 124 mg/dL
Microalb/Creat Ratio: 27 mg/g creat (ref 0–29)
Microalbumin, Urine: 33.9 ug/mL

## 2022-09-23 MED ORDER — TRULICITY 3 MG/0.5ML ~~LOC~~ SOAJ: 3.0000 mg | SUBCUTANEOUS | 2 refills | Status: DC

## 2022-09-28 ENCOUNTER — Other Ambulatory Visit: Payer: Self-pay | Admitting: Family

## 2022-09-28 DIAGNOSIS — E1159 Type 2 diabetes mellitus with other circulatory complications: Secondary | ICD-10-CM

## 2022-10-04 ENCOUNTER — Ambulatory Visit: Payer: Medicaid Other | Admitting: Family Medicine

## 2022-10-04 ENCOUNTER — Encounter: Payer: Self-pay | Admitting: Family Medicine

## 2022-10-04 VITALS — BP 148/83 | HR 69 | Temp 98.5°F | Ht 64.0 in | Wt 210.0 lb

## 2022-10-04 DIAGNOSIS — L089 Local infection of the skin and subcutaneous tissue, unspecified: Secondary | ICD-10-CM

## 2022-10-04 DIAGNOSIS — T63441A Toxic effect of venom of bees, accidental (unintentional), initial encounter: Secondary | ICD-10-CM

## 2022-10-04 MED ORDER — METHYLPREDNISOLONE ACETATE 40 MG/ML IJ SUSP
40.0000 mg | Freq: Once | INTRAMUSCULAR | Status: AC
Start: 2022-10-04 — End: 2022-10-04
  Administered 2022-10-04: 40 mg via INTRAMUSCULAR

## 2022-10-04 MED ORDER — CEPHALEXIN 500 MG PO CAPS
500.0000 mg | ORAL_CAPSULE | Freq: Four times a day (QID) | ORAL | 0 refills | Status: AC
Start: 2022-10-04 — End: 2022-10-11

## 2022-10-04 MED ORDER — PREDNISONE 20 MG PO TABS
ORAL_TABLET | ORAL | 0 refills | Status: DC
Start: 2022-10-04 — End: 2022-11-29

## 2022-10-04 MED ORDER — LEVOCETIRIZINE DIHYDROCHLORIDE 5 MG PO TABS
5.0000 mg | ORAL_TABLET | Freq: Every evening | ORAL | 0 refills | Status: DC | PRN
Start: 2022-10-04 — End: 2023-04-22

## 2022-10-04 NOTE — Patient Instructions (Signed)
Bee, Wasp, or [employer], Adult Bees, wasps, and hornets are part of a family of insects that sting. Normally, a sting will cause pain, redness, and swelling at the sting site. However, some people have an allergy to these stings, and their reactions can be much more serious. What increases the risk? You may be at a greater risk of getting stung if you: Provoke a stinging insect by swatting or disturbing it. Wear strong-smelling soaps, deodorants, or body sprays. Spend time outdoors near gardens with flowers or fruit trees or in clothes that expose skin. Eat or drink outside. What are the signs or symptoms? The reaction to an insect sting can vary from a mild, normal response to life-threatening anaphylaxis. The sting site is often a red lump in the skin, sometimes with a tiny hole in the center, that may still have the stinger in the center of the wound. Normal reaction A normal reaction is experienced by most people after an insect sting. Symptoms include: Pain, redness, and swelling at the sting site. These can develop over 24-48 hours. Pain, redness, and swelling that may spread to a larger, connected area beyond the sting site. The spreading can continue over 24-48 hours. Allergic reaction An allergic reaction can vary in severity and includes symptoms in other areas of the body beyond the sting site. People who experience an allergic reaction have a higher risk of having similar or worse symptoms the next time they are stung. Symptoms may include: Hives, itching, and swelling. Abdominal symptoms including cramping, nausea, vomiting, and diarrhea. Severe symptoms that require immediate medical attention include: Chest pain or tightness. Wheezing or trouble breathing. Swelling of the tongue, throat, or lips. Trouble swallowing or hoarse voice. Anaphylactic reaction An anaphylactic reaction is a severe, life-threatening allergy and requires immediate medical attention. The symptoms often  include severe allergic reaction symptoms that develop rapidly and lead to: A sudden and sharp drop in blood pressure. Dizziness. Loss of consciousness. How is this diagnosed? This condition is usually diagnosed based on your symptoms and medical history as well as a physical exam. You may have an allergy test to determine if you are allergic to the insect venom. How is this treated? If you were stung by a bee, the stinger and a small sac of venom may be in the wound. Remove the stinger as soon as possible. Do this by brushing across the wound with gauze, a clean fingernail, or a flat card such as a credit card. This can help reduce the severity of your body's reaction to the sting. Normal reactions can be treated with: Washing the area thoroughly with soap and water. Applying ice to the area to reduce swelling. Oral or topical medicines to help reduce pain and itching, if present. Pay close attention to your symptoms after you have been stung. If possible, have someone stay with you to see if an allergic reaction develops. If allergic symptoms develop, oral antihistamines can be taken and you will need medical help right away. If you had an allergic reaction before, you may need to: Use an auto-injector "pen"(pre-filled automatic epinephrine injection device)at the first sign of an allergic reaction. Get medical help right away because the epinephrine is short-acting. It is intended to give you more time to get to an emergency room. Follow these instructions at home:  Wash the sting site 2-3 times a day with soap and water as told by your health care provider. Apply or take over-the-counter and prescription medicines only as told  by your health care provider. If directed, apply ice to the sting area. Put ice in a plastic bag. Place a towel between your skin and the bag. Leave the ice on for 20 minutes, 2-3 times a day. Do not scratch the sting area. If you had a severe allergic reaction to  a sting, you may need to: Wear a medical bracelet or necklace that lists the allergy. Carry an anaphylaxis kit or an epinephrine auto-injector "pen" with you at all times. Tell your family members, friends, and coworkers when and how to use it. Use it at the first sign of an allergic reaction. How is this prevented? Avoid swatting at stinging insects and disturbing insect nests. Do not use fragrant soaps or lotions and avoid sitting near flowering plants, if possible. Wear shoes, pants, and long sleeves when spending time outdoors, especially in grassy areas where stinging insects are common. Keep outdoor areas free from nests or hives. Keep food and drink containers covered when eating outdoors. Wear gloves if you are gardening or working outdoors. Find a barrier between you and the insect(s), such as a door, if an attack by a stinging insect or a swarm seems likely. Contact a health care provider if: Your symptoms do not get better in 2-3 days. You have redness, swelling, or pain that spreads beyond the area of the sting. You have a fever. Get help right away if: You have symptoms of a severe allergic reaction. These include: Chest tightness or pain. Wheezing, or trouble swallowing or breathing. Light-headedness, dizziness, or fainting. Itchy, raised, red patches on the skin beyond the sting site. Abdominal cramping, nausea, vomiting, or diarrhea. Trouble swallowing or a swollen tongue, throat, or lips. These symptoms may be an emergency. Get help right away. Call 911. Do not wait to see if the symptoms will go away. Do not drive yourself to the hospital. Summary Stings from bees, wasps, and hornets can cause pain and swelling, but they are usually not serious. However, some people may have an allergic reaction to a sting. This can cause the symptoms to be more severe. Pay close attention to your symptoms after you have been stung. If possible, have someone stay with you to look for  signs of worsening symptoms. Call your health care provider if you have any signs of an allergic reaction. This information is not intended to replace advice given to you by your health care provider. Make sure you discuss any questions you have with your health care provider. Document Revised: 06/15/2021 Document Reviewed: 06/15/2021 Elsevier Patient Education  2024 [employer].

## 2022-10-04 NOTE — Progress Notes (Signed)
Subjective: CC:bee sting PCP: Junie Spencer, [name] [id number] Sherry Glass is a 52 y.o. female presenting to clinic today for:  1. Bee sting Patient sustained a bee sting x 3 on Friday.  Her left hand has been persistently swelling despite use of Benadryl, ice and topical cortisone cream.  She has not been stung by bee in several years and so really has not had any significant allergic reaction in the past.  She denies any sore throat, difficulty breathing, throat irritation or swelling.  No rash elsewhere.   ROS: Per HPI  Allergies  Allergen Reactions   Dilaudid [Hydromorphone]     Itching and vomitting   Doxycycline Itching   Valproic Acid And Related Nausea And Vomiting   Past Medical History:  Diagnosis Date   Allergic rhinitis    Anxiety and depression    Asthma    pt states this doesn't really bother her anymore   Cataract    Chest pain syndrome    COPD (chronic obstructive pulmonary disease) (HCC)    Depression    Diabetes mellitus without complication (HCC)    History of kidney stones    HTN (hypertension)    Hypercholesterolemia    Hyperglycemia    Hypertension    Hypothyroidism    N&V (nausea and vomiting) 01/08/2021   Obesity    Restless leg syndrome    Tobacco abuse     Current Outpatient Medications:    albuterol (VENTOLIN HFA) 108 (90 Base) MCG/ACT inhaler, Inhale 2 puffs into the lungs every 6 (six) hours as needed for wheezing or shortness of breath., Disp: 8 g, Rfl: 2   aspirin EC 81 MG tablet, Take one po qd, Disp: 90 tablet, Rfl: 1   budesonide (PULMICORT) 0.5 MG/2ML nebulizer solution, Take 2 mLs (0.5 mg total) by nebulization 2 (two) times daily. (Patient taking differently: Take 0.5 mg by nebulization 2 (two) times daily as needed (wheezing/shortness of breath.).), Disp: 120 mL, Rfl: 3   cyclobenzaprine (FLEXERIL) 10 MG tablet, Take 1 tablet (10 mg total) by mouth 3 (three) times daily as needed. for muscle spams, Disp: 90 tablet, Rfl: 1    desvenlafaxine (PRISTIQ) 100 MG 24 hr tablet, Take 1 tablet (100 mg total) by mouth daily., Disp: 90 tablet, Rfl: 1   Dulaglutide (TRULICITY) 3 MG/0.5ML SOPN, Inject 3 mg as directed once a week., Disp: 2 mL, Rfl: 2   esomeprazole (NEXIUM) 40 MG capsule, Take 1 capsule (40 mg total) by mouth daily before breakfast., Disp: 30 capsule, Rfl: 3   gabapentin (NEURONTIN) 300 MG capsule, Take 1 capsule (300 mg total) by mouth 3 (three) times daily., Disp: 90 capsule, Rfl: 2   glimepiride (AMARYL) 2 MG tablet, Take 1 tablet (2 mg total) by mouth daily with breakfast., Disp: 90 tablet, Rfl: 1   Glycopyrrolate-Formoterol (BEVESPI AEROSPHERE) 9-4.8 MCG/ACT AERO, Inhale 2 puffs into the lungs 2 (two) times daily., Disp: 5.9 g, Rfl: 0   ibuprofen (ADVIL) 800 MG tablet, Take 1 tablet (800 mg total) by mouth 2 (two) times daily., Disp: 180 tablet, Rfl: 1   levothyroxine (SYNTHROID) 125 MCG tablet, Take 1 tablet (125 mcg total) by mouth daily before breakfast., Disp: 95 tablet, Rfl: 3   losartan (COZAAR) 100 MG tablet, Take 1 tablet by mouth once daily, Disp: 90 tablet, Rfl: 0   metFORMIN (GLUCOPHAGE) 1000 MG tablet, Take 1 tablet (1,000 mg total) by mouth 2 (two) times daily with a meal. (NEEDS TO BE SEEN BEFORE NEXT REFILL), Disp:  180 tablet, Rfl: 1   rosuvastatin (CRESTOR) 10 MG tablet, Take 1 tablet (10 mg total) by mouth daily., Disp: 90 tablet, Rfl: 3   TREMFYA 100 MG/ML SOSY, Inject 100 mg into the skin every 8 (eight) weeks., Disp: , Rfl:    vitamin B-12 (CYANOCOBALAMIN) 1000 MCG tablet, Take 1,000 mcg by mouth in the morning., Disp: , Rfl:    Vitamin D, Ergocalciferol, (DRISDOL) 1.25 MG (50000 UNIT) CAPS capsule, Take 1 capsule (50,000 Units total) by mouth every 7 (seven) days., Disp: 12 capsule, Rfl: 3   zinc gluconate 50 MG tablet, Take 50 mg by mouth in the morning., Disp: , Rfl:  Social History   Socioeconomic History   Marital status: Widowed    Spouse name: Not on file   Number of children: 4    Years of education: Not on file   Highest education level: 12th grade  Occupational History   Not on file  Tobacco Use   Smoking status: Former    Packs/day: 1.00    Years: 25.00    Additional pack years: 0.00    Total pack years: 25.00    Types: Cigarettes    Start date: 11/13/1988    Quit date: 10/28/2016    Years since quitting: 5.9   Smokeless tobacco: Never  Vaping Use   Vaping Use: Never used  Substance and Sexual Activity   Alcohol use: Not Currently    Comment: For special occasions, very rare    Drug use: No   Sexual activity: Never    Birth control/protection: Surgical  Other Topics Concern   Not on file  Social History Narrative   [facility] (11/18/16):   Originally from [place] but grew up in [place]. Cared for her husband until he passed. 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 Does have significant dust exposure. Has 3 dogs currently. Previously had a parakeet as a child. No mold exposure. Enjoys playing with her grandchildren.        Social Determinants of Health   Financial Resource Strain: Medium Risk (09/21/2022)   Overall Financial Resource Strain (CARDIA)    Difficulty of Paying Living Expenses: Somewhat hard  Food Insecurity: Food Insecurity Present (09/21/2022)   Hunger Vital Sign    Worried About Running Out of Food in the Last Year: Sometimes true    Ran Out of Food in the Last Year: Sometimes true  Transportation Needs: No Transportation Needs (09/21/2022)   PRAPARE - Administrator, Civil Service (Medical): No    Lack of Transportation (Non-Medical): No  Physical Activity: Insufficiently Active (09/21/2022)   Exercise Vital Sign    Days of Exercise per Week: 2 days    Minutes of Exercise per Session: 30 min  Stress: Stress Concern Present (09/21/2022)   [employer] of Occupational Health - Occupational Stress Questionnaire    Feeling of Stress : To some extent  Social Connections:  Unknown (09/21/2022)   Social Connection and Isolation Panel [NHANES]    Frequency of Communication with Friends and Family: Twice a week    Frequency of Social Gatherings with Friends and Family: Three times a week    Attends Religious Services: Patient declined    Active Member of Clubs or Organizations: No    Attends Banker Meetings: Not on file    Marital Status: Widowed  Intimate Partner Violence: Not on file   Family History  Adopted: Yes  Problem Relation Age of Onset  Breast cancer Neg Hx     Objective: Office vital signs reviewed. BP (!) 148/83   Pulse 69   Temp 98.5 F (36.9 C)   Ht 5\' 4"  (1.626 m)   Wt 210 lb (95.3 kg)   SpO2 94%   BMI 36.05 kg/m   Physical Examination:  General: Awake, alert, well nourished, No acute distress MSK: Left hand with moderate soft tissue swelling along the dorsal aspect of the hand.  She has 3 areas of skin breakdown where the bee stings occurred.  She has some mild warmth and erythema.  She has full flexion extension of the fingers.  Good capillary refill  Assessment/ Plan: 52 y.o. female   Allergic reaction to bee sting - Plan: methylPREDNISolone acetate (DEPO-MEDROL) injection 40 mg, predniSONE (DELTASONE) 20 MG tablet, levocetirizine (XYZAL) 5 MG tablet  Soft tissue infection - Plan: cephALEXin (KEFLEX) 500 MG capsule  Depo-Medrol administered intramuscularly.  She may start prednisone burst tomorrow.  Start Xyzal.  I have given her some Keflex to cover for soft tissue infection given ongoing symptoms that are refractory to OTC management.  I also made borders along her hand and advised her to follow-up if her swelling or erythema gets beyond those borders.  She voiced good understanding will follow as needed  No orders of the defined types were placed in this encounter.  No orders of the defined types were placed in this encounter.    Raliegh Ip, DO [facility] [phone]

## 2022-10-05 ENCOUNTER — Ambulatory Visit: Payer: Medicaid Other | Admitting: Orthopaedic Surgery

## 2022-10-10 ENCOUNTER — Encounter: Payer: Self-pay | Admitting: *Deleted

## 2022-10-19 ENCOUNTER — Other Ambulatory Visit: Payer: Self-pay

## 2022-10-19 ENCOUNTER — Ambulatory Visit (INDEPENDENT_AMBULATORY_CARE_PROVIDER_SITE_OTHER): Payer: Medicaid Other | Admitting: Orthopaedic Surgery

## 2022-10-19 ENCOUNTER — Encounter: Payer: Self-pay | Admitting: Orthopaedic Surgery

## 2022-10-19 VITALS — Ht 64.0 in | Wt 210.0 lb

## 2022-10-19 DIAGNOSIS — G8929 Other chronic pain: Secondary | ICD-10-CM

## 2022-10-19 DIAGNOSIS — M545 Low back pain, unspecified: Secondary | ICD-10-CM | POA: Diagnosis not present

## 2022-10-19 NOTE — Progress Notes (Signed)
Office Visit Note   Patient: Sherry Glass           Date of Birth: 02-12-1971           MRN: [id number] Visit Date: 10/19/2022              Requested by: Junie Spencer, [name] [address] [place],  [place] [address] PCP: Junie Spencer, [name]   Assessment & Plan: Visit Diagnoses:  1. Chronic bilateral low back pain, unspecified whether sciatica present     Plan: MRI scan was reviewed as well as exam findings and plain radiographs.  Patient has tiny protrusion L4-5 has claudication symptoms but has good pulses and no spinal stenosis on recent MRI May 23, 2022.  She does have some facet arthropathy which could be giving her some back pain and facet syndrome.  She will try to avoid lifting continue weight loss.  She is already been through 8 weeks of therapy.  I discussed with her at this point I would not recommend repetitive epidurals.  She can follow-up with me as needed Encouraged her and congratulated her on her weight loss.  Follow-Up Instructions: No follow-ups on file.   Orders:  Orders Placed This Encounter  Procedures   XR Lumbar Spine 2-3 Views   No orders of the defined types were placed in this encounter.     Procedures: No procedures performed   Clinical Data: No additional findings.   Subjective: Chief Complaint  Patient presents with   Lower Back - Pain    HPI 52 year old female with chronic back pain.  She has pain that radiates into her hips she states she can only walk 30 feet she starts getting some back pain and some numbness.  She can stand for 10 minutes has to stop sit down for 5 to 10 minutes and then can repeat it.  Long past history of smoking she has not smoked in greater than 10 years she does have some COPD.  She cannot taking care of multiple grandchildren and has to do some lifting.  She saw another provider who sent her to the pain clinic they tried injections she states it really did not help recently had an injection for bee sting  with cortisone injection right buttocks and has rash over buttocks after the injection.  She states the epidural injections in the pain management helped for short period time and then pain returned.  BMI 36 history of GERD depression allergies.  She has been working on losing weight and BMI used to be greater than 40.  Review of Systems no associated blood bladder symptoms no chills or fever.   Objective: Vital Signs: Ht 5\' 4"  (1.626 m)   Wt 210 lb (95.3 kg)   BMI 36.05 kg/m   Physical Exam Constitutional:      Appearance: She is well-developed.  HENT:     Head: Normocephalic.     Right Ear: External ear normal.     Left Ear: External ear normal. There is no impacted cerumen.  Eyes:     Pupils: Pupils are equal, round, and reactive to light.  Neck:     Thyroid: No thyromegaly.     Trachea: No tracheal deviation.  Cardiovascular:     Rate and Rhythm: Normal rate.  Pulmonary:     Effort: Pulmonary effort is normal.  Abdominal:     Palpations: Abdomen is soft.  Musculoskeletal:     Cervical back: No rigidity.  Skin:  General: Skin is warm and dry.  Neurological:     Mental Status: She is alert and oriented to person, place, and time.  Psychiatric:        Behavior: Behavior normal.     Ortho Exam patient gets from sitting standing normal gait bilateral mild trochanteric bursal tenderness.  Reflexes are intact anterior tib is intact pedal pulses DP PT are 2+ and symmetrical negative straight leg raising 90 degrees bilateral.  DTRs are symmetrical 2+.  Specialty Comments:  No specialty comments available.  Imaging: arrative & Impression  CLINICAL DATA:  Low back pain radiating to both legs. Bilateral leg weakness and numbness.   EXAM: MRI LUMBAR SPINE WITHOUT CONTRAST   TECHNIQUE: Multiplanar, multisequence MR imaging of the lumbar spine was performed. No intravenous contrast was administered.   COMPARISON:  11/02/2019   FINDINGS: Segmentation: Same numbering  terminology utilized as on the previous examination. L5 is a sacralized vertebra.   Alignment:  Normal   Vertebrae: No fracture or focal bone lesion. Mild degenerative endplate edema at A54-U9 and L1-2 could possibly be associated with back pain. This was not present previously.   Conus medullaris and cauda equina: Conus extends to the L1 level. Conus and cauda equina appear normal.   Paraspinal and other soft tissues: Negative   Disc levels:   T11-12 through L3-4: Very minimal disc bulges. As noted above, there is minimal endplate edema at W11-B1 and L1-2 which could relate to regional pain. No compressive stenosis or out this region. Mild facet and ligamentous hypertrophy at the L3-4 level put no stenosis.   L4-5: Chronic disc degeneration with endplate osteophytes any tiny disc protrusion with slight caudal down turning, not increased from the previous study. Mild facet and ligamentous hypertrophy. No compressive stenosis at this level.   L5-S1: Transitional, rudimentary and normal.   IMPRESSION: 1. Same numbering terminology utilized as on the previous study. L5 is a sacralized vertebra. 2. Mild degenerative endplate edema at Y78-G9 and L1-2 which could relate to regional pain. This was not present previously. 3. Chronic disc degeneration at L4-5 with endplate osteophytes and tiny disc protrusion with slight caudal down turning. No apparent change since the previous study. No apparent compressive stenosis. 4. Mild facet osteoarthritis at L3-4 and L4-5 which could contribute to back pain or referred facet syndrome pain.     Electronically Signed   By: Paulina Fusi M.D.   On: 05/23/2022 08:39       PMFS History: Patient Active Problem List   Diagnosis Date Noted   Chronic bilateral low back pain with sciatica 09/22/2022   Diarrhea 09/09/2022   Adrenal mass (HCC) 11/11/2021   Adhesive capsulitis of left shoulder    Abdominal pain, epigastric 01/08/2021   N&V  (nausea and vomiting) 01/08/2021   Loose stools 06/04/2019   Hepatic steatosis 05/23/2019   Psoriasis 05/09/2019   Depression, major, single episode, mild (HCC) 10/12/2018   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   Nocturnal hypoxemia 05/29/2017   GAD (generalized anxiety disorder) 02/13/2017   Restrictive lung disease 01/27/2017   Lung nodule 01/27/2017   GERD (gastroesophageal reflux disease) 11/18/2016   Snoring 11/18/2016   Witnessed episode of apnea 11/18/2016   Chronic seasonal allergic rhinitis 11/18/2016   Restless leg syndrome 11/18/2016   Fibromyalgia 11/18/2016   Hyperlipidemia associated with type 2 diabetes mellitus (HCC) 06/09/2016   Vitamin D deficiency 05/16/2016   Morbid obesity (HCC) 05/13/2016   Uncontrolled type 2 diabetes mellitus with hyperglycemia (HCC) 05/13/2016  COPD (chronic obstructive pulmonary disease) (HCC) 05/13/2016   Chest pain syndrome    Hypertension associated with diabetes (HCC)    Stopped smoking with greater than 25 pack year history    Past Medical History:  Diagnosis Date   Allergic rhinitis    Anxiety and depression    Asthma    pt states this doesn't really bother her anymore   Cataract    Chest pain syndrome    COPD (chronic obstructive pulmonary disease) (HCC)    Depression    Diabetes mellitus without complication (HCC)    History of kidney stones    HTN (hypertension)    Hypercholesterolemia    Hyperglycemia    Hypertension    Hypothyroidism    N&V (nausea and vomiting) 01/08/2021   Obesity    Restless leg syndrome    Tobacco abuse     Family History  Adopted: Yes  Problem Relation Age of Onset   Breast cancer Neg Hx     Past Surgical History:  Procedure Laterality Date   ABDOMINAL HYSTERECTOMY     CARDIAC CATHETERIZATION  2014   CATARACT EXTRACTION Right 2023   CATARACT EXTRACTION W/PHACO Left 06/23/2016   Procedure: CATARACT EXTRACTION PHACO AND INTRAOCULAR LENS PLACEMENT (IOC);  Surgeon:  Gemma Payor, MD;  Location: [facility];  Service: Ophthalmology;  Laterality: Left;  left - pt knows to arrive at 10:50 cde 9.11   CESAREAN SECTION     CHOLECYSTECTOMY     COLONOSCOPY WITH PROPOFOL N/A 10/14/2019   Procedure: COLONOSCOPY WITH PROPOFOL;  Surgeon: Corbin Ade, MD;  Location: [facility];  Service: Endoscopy;  Laterality: N/A;  8:00   ESOPHAGOGASTRODUODENOSCOPY (EGD) WITH PROPOFOL N/A 02/01/2021   Surgeon: Corbin Ade, MD; normal exam   EXCISION MASS NECK     cyst   left foot surgery     removal of cyst   POLYPECTOMY  10/14/2019   Procedure: POLYPECTOMY;  Surgeon: Corbin Ade, MD;  Location: [facility];  Service: Endoscopy;;   SHOULDER ARTHROSCOPY Left 08/05/2021   Procedure: left shoulder arthroscopy, manipulation under anesthesia, rotator interval release;  Surgeon: Cammy Copa, MD;  Location: [facility];  Service: Orthopedics;  Laterality: Left;   THYROID SURGERY     Dr. Lovell Sheehan   TONSILLECTOMY     Social History   Occupational History   Not on file  Tobacco Use   Smoking status: Former    Packs/day: 1.00    Years: 25.00    Additional pack years: 0.00    Total pack years: 25.00    Types: Cigarettes    Start date: 11/13/1988    Quit date: 10/28/2016    Years since quitting: 5.9   Smokeless tobacco: Never  Vaping Use   Vaping Use: Never used  Substance and Sexual Activity   Alcohol use: Not Currently    Comment: For special occasions, very rare    Drug use: No   Sexual activity: Never    Birth control/protection: Surgical

## 2022-10-21 ENCOUNTER — Telehealth: Payer: Self-pay | Admitting: Orthopaedic Surgery

## 2022-10-21 NOTE — Telephone Encounter (Signed)
Mailed co pay receipt to pt from [facility] appt. Mailed 10/21/22

## 2022-10-25 ENCOUNTER — Telehealth: Payer: Self-pay | Admitting: Radiology

## 2022-10-25 NOTE — Telephone Encounter (Signed)
Please see message from [place] office below and advise.  She called asking for a work note saying it is ok for her to work.  I asked if we had taken her oow and she said no but her employer was wanting a note saying she is fine to work.  Her number is [phone].

## 2022-10-27 NOTE — Telephone Encounter (Signed)
Note entered. Printed at [facility] office for patient to pick up.

## 2022-11-11 ENCOUNTER — Encounter: Payer: Self-pay | Admitting: Gastroenterology

## 2022-11-11 DIAGNOSIS — J449 Chronic obstructive pulmonary disease, unspecified: Secondary | ICD-10-CM | POA: Diagnosis not present

## 2022-11-11 DIAGNOSIS — I10 Essential (primary) hypertension: Secondary | ICD-10-CM | POA: Diagnosis not present

## 2022-11-11 DIAGNOSIS — Z7982 Long term (current) use of aspirin: Secondary | ICD-10-CM | POA: Diagnosis not present

## 2022-11-11 DIAGNOSIS — Z885 Allergy status to narcotic agent status: Secondary | ICD-10-CM | POA: Diagnosis not present

## 2022-11-11 DIAGNOSIS — Z5941 Food insecurity: Secondary | ICD-10-CM | POA: Diagnosis not present

## 2022-11-11 DIAGNOSIS — E079 Disorder of thyroid, unspecified: Secondary | ICD-10-CM | POA: Diagnosis not present

## 2022-11-11 DIAGNOSIS — Z7984 Long term (current) use of oral hypoglycemic drugs: Secondary | ICD-10-CM | POA: Diagnosis not present

## 2022-11-11 DIAGNOSIS — E119 Type 2 diabetes mellitus without complications: Secondary | ICD-10-CM | POA: Diagnosis not present

## 2022-11-11 DIAGNOSIS — E785 Hyperlipidemia, unspecified: Secondary | ICD-10-CM | POA: Diagnosis not present

## 2022-11-11 DIAGNOSIS — Z87891 Personal history of nicotine dependence: Secondary | ICD-10-CM | POA: Diagnosis not present

## 2022-11-11 DIAGNOSIS — I700 Atherosclerosis of aorta: Secondary | ICD-10-CM | POA: Diagnosis not present

## 2022-11-11 DIAGNOSIS — N132 Hydronephrosis with renal and ureteral calculous obstruction: Secondary | ICD-10-CM | POA: Diagnosis not present

## 2022-11-11 DIAGNOSIS — Z881 Allergy status to other antibiotic agents status: Secondary | ICD-10-CM | POA: Diagnosis not present

## 2022-11-11 DIAGNOSIS — N23 Unspecified renal colic: Secondary | ICD-10-CM | POA: Diagnosis not present

## 2022-11-11 DIAGNOSIS — K760 Fatty (change of) liver, not elsewhere classified: Secondary | ICD-10-CM | POA: Diagnosis not present

## 2022-11-15 ENCOUNTER — Ambulatory Visit: Payer: Medicaid Other | Admitting: Family

## 2022-11-17 ENCOUNTER — Ambulatory Visit: Payer: Medicaid Other | Admitting: Family Medicine

## 2022-11-17 ENCOUNTER — Encounter: Payer: Self-pay | Admitting: Family Medicine

## 2022-11-17 VITALS — BP 124/75 | HR 77 | Temp 97.7°F | Ht 64.0 in | Wt 200.2 lb

## 2022-11-17 DIAGNOSIS — N200 Calculus of kidney: Secondary | ICD-10-CM

## 2022-11-17 DIAGNOSIS — N1330 Unspecified hydronephrosis: Secondary | ICD-10-CM

## 2022-11-17 DIAGNOSIS — L0292 Furuncle, unspecified: Secondary | ICD-10-CM

## 2022-11-17 LAB — URINALYSIS, ROUTINE W REFLEX MICROSCOPIC
Bilirubin, UA: NEGATIVE
Glucose, UA: NEGATIVE
Ketones, UA: NEGATIVE
Leukocytes,UA: NEGATIVE
Nitrite, UA: NEGATIVE
Protein,UA: NEGATIVE
Specific Gravity, UA: >1.03 — ABNORMAL HIGH (ref 1.005–1.030)
Urobilinogen, Ur: 0.2 mg/dL (ref 0.2–1.0)
pH, UA: 5 (ref 5.0–7.5)

## 2022-11-17 LAB — MICROSCOPIC EXAMINATION: RBC, Urine: NONE SEEN /hpf (ref 0–2)

## 2022-11-17 MED ORDER — CEPHALEXIN 500 MG PO CAPS
500.0000 mg | ORAL_CAPSULE | Freq: Two times a day (BID) | ORAL | 0 refills | Status: DC
Start: 2022-11-17 — End: 2022-11-29

## 2022-11-17 NOTE — Progress Notes (Signed)
   Acute Office Visit  Subjective:     Patient ID: Sherry Glass, female    DOB: April 19, 1971, 52 y.o.   MRN: [id number]  Chief Complaint  Patient presents with   Follow-up    Went to ER for side pain dx kidney stone, has passed stone. This is 2nd kidney stone she has had    HPI Patient is in today for an ER follow up. She was seen in the ER at [facility] on 11/11/22 for sudden colicky left sided flank pain that started the day before with nausea and vomiting. CT showed a 3 mm obstructing calculus in proximal left ureter with mild proximal left hydroureteronephrosis. She was discharged with pain medication and flomax. She reports that she felt the stone pass a few days later. She denies any fever, flank pain, nausea, vomiting, abdominal pain, or urinary symptoms. She reports remote hx of previous kidney stone. She is interested in referral to urology. Has never seen urology before.   She also reports 2 knots that are tender and red of her right axilla for the last few days. Denies fever or drainage.   ROS As per HPI.      Objective:    BP 124/75   Pulse 77   Temp 97.7 F (36.5 C) (Temporal)   Ht 5\' 4"  (1.626 m)   Wt 200 lb 3.2 oz (90.8 kg)   SpO2 98%   BMI 34.36 kg/m    Physical Exam Vitals and nursing note reviewed.  Constitutional:      General: She is not in acute distress.    Appearance: She is obese. She is not ill-appearing, toxic-appearing or diaphoretic.  Pulmonary:     Effort: Pulmonary effort is normal. No respiratory distress.  Abdominal:     General: Bowel sounds are normal. There is no distension.     Palpations: Abdomen is soft.     Tenderness: There is no abdominal tenderness. There is no right CVA tenderness, left CVA tenderness, guarding or rebound.  Skin:    General: Skin is warm and dry.     Comments: 0.5 cm boil x 2 to right axilla. No exudate or fluctuance.   Neurological:     General: No focal deficit present.     Mental Status: She is alert and  oriented to person, place, and time.  Psychiatric:        Mood and Affect: Mood normal.        Behavior: Behavior normal.     No results found for any visits on 11/17/22.      Assessment & Plan:   [name] was seen today for follow-up.  Diagnoses and all orders for this visit:  Kidney stone Hydroureteronephrosis Reviewed ER note, labs, imaging. Reports passed. Denies symptoms. Will recheck UA to see if hematuria has resolved. Referral to urology placed.  -     Urinalysis, Routine w reflex microscopic -     Ambulatory referral to Urology  Boil Keflex as below. Warm compression. Discussed symptomatic care and return precautions.  -     cephALEXin (KEFLEX) 500 MG capsule; Take 1 capsule (500 mg total) by mouth 2 (two) times daily.   Return if symptoms worsen or fail to improve.  The patient indicates understanding of these issues and agrees with the plan.   Gabriel Earing, [name]

## 2022-11-17 NOTE — Addendum Note (Signed)
Addended by: Gabriel Earing on: 11/17/2022 10:13 AM   Modules accepted: Orders

## 2022-11-19 LAB — URINE CULTURE: Organism ID, Bacteria: NO GROWTH

## 2022-11-25 ENCOUNTER — Ambulatory Visit: Payer: Medicaid Other | Admitting: Family

## 2022-11-29 ENCOUNTER — Ambulatory Visit: Payer: Medicaid Other | Admitting: Family

## 2022-11-29 ENCOUNTER — Encounter: Payer: Self-pay | Admitting: Family

## 2022-11-29 VITALS — BP 136/77 | HR 89 | Temp 98.3°F | Ht 64.0 in | Wt 200.8 lb

## 2022-11-29 DIAGNOSIS — I152 Hypertension secondary to endocrine disorders: Secondary | ICD-10-CM | POA: Diagnosis not present

## 2022-11-29 DIAGNOSIS — J441 Chronic obstructive pulmonary disease with (acute) exacerbation: Secondary | ICD-10-CM | POA: Diagnosis not present

## 2022-11-29 DIAGNOSIS — E1159 Type 2 diabetes mellitus with other circulatory complications: Secondary | ICD-10-CM

## 2022-11-29 DIAGNOSIS — K219 Gastro-esophageal reflux disease without esophagitis: Secondary | ICD-10-CM | POA: Diagnosis not present

## 2022-11-29 DIAGNOSIS — F320 Major depressive disorder, single episode, mild: Secondary | ICD-10-CM | POA: Diagnosis not present

## 2022-11-29 DIAGNOSIS — F411 Generalized anxiety disorder: Secondary | ICD-10-CM

## 2022-11-29 DIAGNOSIS — E1165 Type 2 diabetes mellitus with hyperglycemia: Secondary | ICD-10-CM

## 2022-11-29 LAB — CMP14+EGFR
ALT: 34 IU/L — ABNORMAL HIGH (ref 0–32)
AST: 30 IU/L (ref 0–40)
Albumin: 4.4 g/dL (ref 3.8–4.9)
Alkaline Phosphatase: 98 IU/L (ref 44–121)
BUN/Creatinine Ratio: 15 (ref 9–23)
BUN: 11 mg/dL (ref 6–24)
Bilirubin Total: 0.7 mg/dL (ref 0.0–1.2)
CO2: 21 mmol/L (ref 20–29)
Calcium: 9.7 mg/dL (ref 8.7–10.2)
Chloride: 102 mmol/L (ref 96–106)
Creatinine, Ser: 0.73 mg/dL (ref 0.57–1.00)
Globulin, Total: 2.7 g/dL (ref 1.5–4.5)
Glucose: 151 mg/dL — ABNORMAL HIGH (ref 70–99)
Potassium: 4.8 mmol/L (ref 3.5–5.2)
Sodium: 139 mmol/L (ref 134–144)
Total Protein: 7.1 g/dL (ref 6.0–8.5)
eGFR: 98 mL/min/{1.73_m2} (ref >59)

## 2022-11-29 LAB — BAYER DCA HB A1C WAIVED: HB A1C (BAYER DCA - WAIVED): 6.9 % — ABNORMAL HIGH (ref 4.8–5.6)

## 2022-11-29 MED ORDER — METFORMIN HCL 1000 MG PO TABS
1000.0000 mg | ORAL_TABLET | Freq: Two times a day (BID) | ORAL | 1 refills | Status: DC
Start: 2022-11-29 — End: 2023-05-30

## 2022-11-29 NOTE — Patient Instructions (Signed)

## 2022-11-29 NOTE — Progress Notes (Signed)
Subjective:    Patient ID: Sherry Glass, female    DOB: 06-10-1970, 52 y.o.   MRN: [id number]  Chief Complaint  Patient presents with   Diabetes   Pt presents to the office today for chronic follow up and uncontrolled DM. She was seen on 09/22/22 with an A1C of 8.0.  We increased her Trulicity to 3 mg from 1.5 mg.   Pt has seen Pulmonologist for COPD, Restrictive lung disease, and SOB, but has been cleared.  Followed by Endocrinologists  yearly for hypothyroidism. She had thyroid ablation.   She is followed by [facility] as needed for chronic back pain. Had a MRI 05/22/22. Continues to have bilateral leg numbness and pain. Reports she has fallen 8 times in the last year because of this. Wants second opinion because was told they could do anything.   She is obese with a BMI of 34 and DM and HTN.    She went to ED on 11/07/21 that showed a lung nodule on right upper lobe and recommend repeat in 12 months. Will order today.  Diabetes She presents for her follow-up diabetic visit. She has type 2 diabetes mellitus. Hypoglycemia symptoms include nervousness/anxiousness. Associated symptoms include fatigue. Pertinent negatives for diabetes include no blurred vision and no foot paresthesias. Symptoms are stable. Risk factors for coronary artery disease include dyslipidemia, hypertension, diabetes mellitus, sedentary lifestyle and post-menopausal. She is following a generally healthy diet. Her overall blood glucose range is 140-180 mg/dl.  Hypertension This is a chronic problem. The current episode started more than 1 year ago. The problem has been resolved since onset. Associated symptoms include anxiety. Pertinent negatives include no blurred vision, malaise/fatigue, peripheral edema or shortness of breath. Risk factors for coronary artery disease include obesity, dyslipidemia, diabetes mellitus and sedentary lifestyle. The current treatment provides moderate improvement.  Gastroesophageal  Reflux She complains of belching and heartburn. This is a chronic problem. The current episode started more than 1 year ago. The problem occurs occasionally. Associated symptoms include fatigue. She has tried a PPI for the symptoms. The treatment provided moderate relief.  Anxiety Presents for follow-up visit. Symptoms include excessive worry, insomnia, nervous/anxious behavior and restlessness. Patient reports no shortness of breath.    Depression        This is a chronic problem.  The current episode started more than 1 year ago.   The problem occurs intermittently.  Associated symptoms include fatigue, insomnia and restlessness.  Associated symptoms include no helplessness, no hopelessness and not sad.  Past treatments include SNRIs - Serotonin and norepinephrine reuptake inhibitors.  Compliance with treatment is good.  Past medical history includes anxiety.       Review of Systems  Constitutional:  Positive for fatigue. Negative for malaise/fatigue.  Eyes:  Negative for blurred vision.  Respiratory:  Negative for shortness of breath.   Gastrointestinal:  Positive for heartburn.  Psychiatric/Behavioral:  Positive for depression. The patient is nervous/anxious and has insomnia.   All other systems reviewed and are negative.      Objective:   Physical Exam Vitals reviewed.  Constitutional:      General: She is not in acute distress.    Appearance: She is well-developed. She is obese.  HENT:     Head: Normocephalic and atraumatic.     Right Ear: Tympanic membrane normal.     Left Ear: Tympanic membrane normal.  Eyes:     Pupils: Pupils are equal, round, and reactive to light.  Neck:  Thyroid: No thyromegaly.  Cardiovascular:     Rate and Rhythm: Normal rate and regular rhythm.     Heart sounds: Normal heart sounds. No murmur heard. Pulmonary:     Effort: Pulmonary effort is normal. No respiratory distress.     Breath sounds: Normal breath sounds. No wheezing.  Abdominal:      General: Bowel sounds are normal. There is no distension.     Palpations: Abdomen is soft.     Tenderness: There is no abdominal tenderness.  Musculoskeletal:        General: No tenderness. Normal range of motion.     Cervical back: Normal range of motion and neck supple.  Skin:    General: Skin is warm and dry.  Neurological:     Mental Status: She is alert and oriented to person, place, and time.     Cranial Nerves: No cranial nerve deficit.     Deep Tendon Reflexes: Reflexes are normal and symmetric.  Psychiatric:        Behavior: Behavior normal.        Thought Content: Thought content normal.        Judgment: Judgment normal.        BP 136/77   Pulse 89   Temp 98.3 F (36.8 C) (Temporal)   Ht 5\' 4"  (1.626 m)   Wt 200 lb 12.8 oz (91.1 kg)   SpO2 98%   BMI 34.47 kg/m   Assessment & Plan:  SOPHY BERNDT comes in today with chief complaint of Diabetes   Diagnosis and orders addressed:  1. Uncontrolled type 2 diabetes mellitus with hyperglycemia (HCC) - metFORMIN (GLUCOPHAGE) 1000 MG tablet; Take 1 tablet (1,000 mg total) by mouth 2 (two) times daily with a meal. (NEEDS TO BE SEEN BEFORE NEXT REFILL)  Dispense: 180 tablet; Refill: 1 - Bayer DCA Hb A1c Waived - CMP14+EGFR  2. Chronic obstructive pulmonary disease with acute exacerbation (HCC) - CMP14+EGFR  3. Depression, major, single episode, mild (HCC) - CMP14+EGFR  4. GAD (generalized anxiety disorder) - CMP14+EGFR  5. Gastroesophageal reflux disease, unspecified whether esophagitis present - CMP14+EGFR  6. Hypertension associated with diabetes (HCC) - CMP14+EGFR   Labs pending Health Maintenance reviewed Diet and exercise encouraged  Follow up plan: 3 months   Jannifer Rodney, [name]

## 2022-12-10 ENCOUNTER — Other Ambulatory Visit: Payer: Self-pay | Admitting: Family

## 2022-12-10 DIAGNOSIS — E1159 Type 2 diabetes mellitus with other circulatory complications: Secondary | ICD-10-CM

## 2023-01-06 ENCOUNTER — Telehealth: Payer: Self-pay | Admitting: Family

## 2023-01-09 ENCOUNTER — Ambulatory Visit: Payer: Medicaid Other | Admitting: Urology

## 2023-01-09 ENCOUNTER — Other Ambulatory Visit: Payer: Self-pay | Admitting: Gastroenterology

## 2023-01-09 DIAGNOSIS — R112 Nausea with vomiting, unspecified: Secondary | ICD-10-CM

## 2023-01-09 DIAGNOSIS — K219 Gastro-esophageal reflux disease without esophagitis: Secondary | ICD-10-CM

## 2023-01-09 DIAGNOSIS — R197 Diarrhea, unspecified: Secondary | ICD-10-CM

## 2023-02-03 ENCOUNTER — Encounter: Payer: Self-pay | Admitting: Family Medicine

## 2023-02-03 ENCOUNTER — Ambulatory Visit: Payer: Medicaid Other | Admitting: Family Medicine

## 2023-02-03 VITALS — BP 138/74 | HR 77 | Temp 98.0°F | Ht 64.0 in | Wt 201.0 lb

## 2023-02-03 DIAGNOSIS — J441 Chronic obstructive pulmonary disease with (acute) exacerbation: Secondary | ICD-10-CM | POA: Diagnosis not present

## 2023-02-03 DIAGNOSIS — F320 Major depressive disorder, single episode, mild: Secondary | ICD-10-CM | POA: Diagnosis not present

## 2023-02-03 DIAGNOSIS — L0291 Cutaneous abscess, unspecified: Secondary | ICD-10-CM | POA: Diagnosis not present

## 2023-02-03 DIAGNOSIS — F411 Generalized anxiety disorder: Secondary | ICD-10-CM | POA: Diagnosis not present

## 2023-02-03 MED ORDER — BREZTRI AEROSPHERE 160-9-4.8 MCG/ACT IN AERO
2.0000 | INHALATION_SPRAY | Freq: Two times a day (BID) | RESPIRATORY_TRACT | 11 refills | Status: DC
Start: 2023-02-03 — End: 2023-04-21

## 2023-02-03 MED ORDER — SULFAMETHOXAZOLE-TRIMETHOPRIM 800-160 MG PO TABS
1.0000 | ORAL_TABLET | Freq: Two times a day (BID) | ORAL | 0 refills | Status: AC
Start: 2023-02-03 — End: 2023-02-10

## 2023-02-03 MED ORDER — PREDNISONE 20 MG PO TABS: 40.0000 mg | ORAL_TABLET | Freq: Every day | ORAL | 0 refills | Status: AC

## 2023-02-03 NOTE — Progress Notes (Signed)
Subjective:  Patient ID: Sherry Glass, female    DOB: 1971-01-30, 52 y.o.   MRN: [id number]  Patient Care Team: Junie Spencer, [name] as PCP - General (Family Medicine) Kirstie Peri, MD (Internal Medicine) Jena Gauss Gerrit Friends, MD as Consulting Physician (Gastroenterology) Delora Fuel, [place] (Optometry)   Chief Complaint:  Shortness of Breath   HPI: Sherry Glass is a 52 y.o. female presenting on 02/03/2023 for Shortness of Breath  Shortness of Breath   1. Chronic obstructive pulmonary disease with acute exacerbation [facility]) Patient has a history of COPD. She reports compliance with Pulmicort and Bevespi. She states that she is taking albuterol 3 times daily. She has not followed up with Pulmonology in 3 years. She reports that she is having trouble with activity. She gets short of breath with exertion. States that she is no longer able to walk to the mailbox without getting short of breath. Denies nighttime awakenings with cough/sob. States that she is is coughing more, but does not have increased sputum production. Denies any fever or URI symptoms.   2. Abscess States that she has an abscess on her left buttock. She has not used any OTC treatments. Denies any fever, malaise, n/v/d. States that she noticed it about a week ago and it has not improved, worsening pain.    Relevant past medical, surgical, family, and social history reviewed and updated as indicated.  Allergies and medications reviewed and updated. Data reviewed: Chart in Epic.   Past Medical History:  Diagnosis Date   Allergic rhinitis    Anxiety and depression    Asthma    pt states this doesn't really bother her anymore   Cataract    Chest pain syndrome    COPD (chronic obstructive pulmonary disease) (HCC)    Depression    Diabetes mellitus without complication (HCC)    History of kidney stones    HTN (hypertension)    Hypercholesterolemia    Hyperglycemia    Hypertension    Hypothyroidism    N&V (nausea and  vomiting) 01/08/2021   Obesity    Restless leg syndrome    Tobacco abuse     Past Surgical History:  Procedure Laterality Date   ABDOMINAL HYSTERECTOMY     CARDIAC CATHETERIZATION  2014   CATARACT EXTRACTION Right 2023   CATARACT EXTRACTION W/PHACO Left 06/23/2016   Procedure: CATARACT EXTRACTION PHACO AND INTRAOCULAR LENS PLACEMENT (IOC);  Surgeon: Gemma Payor, MD;  Location: [facility];  Service: Ophthalmology;  Laterality: Left;  left - pt knows to arrive at 10:50 cde 9.11   CESAREAN SECTION     CHOLECYSTECTOMY     COLONOSCOPY WITH PROPOFOL N/A 10/14/2019   Procedure: COLONOSCOPY WITH PROPOFOL;  Surgeon: Corbin Ade, MD;  Location: [facility];  Service: Endoscopy;  Laterality: N/A;  8:00   ESOPHAGOGASTRODUODENOSCOPY (EGD) WITH PROPOFOL N/A 02/01/2021   Surgeon: Corbin Ade, MD; normal exam   EXCISION MASS NECK     cyst   left foot surgery     removal of cyst   POLYPECTOMY  10/14/2019   Procedure: POLYPECTOMY;  Surgeon: Corbin Ade, MD;  Location: [facility];  Service: Endoscopy;;   SHOULDER ARTHROSCOPY Left 08/05/2021   Procedure: left shoulder arthroscopy, manipulation under anesthesia, rotator interval release;  Surgeon: Cammy Copa, MD;  Location: [facility] OR;  Service: Orthopedics;  Laterality: Left;   THYROID SURGERY     Dr. Lovell Sheehan   TONSILLECTOMY      Social  History   Socioeconomic History   Marital status: Widowed    Spouse name: Not on file   Number of children: 4   Years of education: Not on file   Highest education level: 12th grade  Occupational History   Not on file  Tobacco Use   Smoking status: Former    Current packs/day: 0.00    Average packs/day: 1 pack/day for 28.0 years (28.0 ttl pk-yrs)    Types: Cigarettes    Start date: 11/13/1988    Quit date: 10/28/2016    Years since quitting: 6.2   Smokeless tobacco: Never  Vaping Use   Vaping status: Never Used  Substance and Sexual Activity   Alcohol use: Not Currently    Comment:  For special occasions, very rare    Drug use: No   Sexual activity: Never    Birth control/protection: Surgical  Other Topics Concern   Not on file  Social History Narrative   [facility] (11/18/16):   Originally from [place] but grew up in [place]. Cared for her husband until he passed. 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 Does have significant dust exposure. Has 3 dogs currently. Previously had a parakeet as a child. No mold exposure. Enjoys playing with her grandchildren.        Social Determinants of Health   Financial Resource Strain: Medium Risk (09/21/2022)   Overall Financial Resource Strain (CARDIA)    Difficulty of Paying Living Expenses: Somewhat hard  Food Insecurity: Food Insecurity Present (09/21/2022)   Hunger Vital Sign    Worried About Running Out of Food in the Last Year: Sometimes true    Ran Out of Food in the Last Year: Sometimes true  Transportation Needs: No Transportation Needs (09/21/2022)   PRAPARE - Administrator, Civil Service (Medical): No    Lack of Transportation (Non-Medical): No  Physical Activity: Insufficiently Active (09/21/2022)   Exercise Vital Sign    Days of Exercise per Week: 2 days    Minutes of Exercise per Session: 30 min  Stress: Stress Concern Present (09/21/2022)   [employer] of Occupational Health - Occupational Stress Questionnaire    Feeling of Stress : To some extent  Social Connections: Unknown (09/21/2022)   Social Connection and Isolation Panel [NHANES]    Frequency of Communication with Friends and Family: Twice a week    Frequency of Social Gatherings with Friends and Family: Three times a week    Attends Religious Services: Patient declined    Active Member of Clubs or Organizations: No    Attends Banker Meetings: Not on file    Marital Status: Widowed  Intimate Partner Violence: Not on file    Outpatient Encounter Medications as of 02/03/2023   Medication Sig   albuterol (VENTOLIN HFA) 108 (90 Base) MCG/ACT inhaler Inhale 2 puffs into the lungs every 6 (six) hours as needed for wheezing or shortness of breath.   aspirin EC 81 MG tablet Take one po qd   Budeson-Glycopyrrol-Formoterol (BREZTRI AEROSPHERE) 160-9-4.8 MCG/ACT AERO Inhale 2 puffs into the lungs 2 (two) times daily.   cyclobenzaprine (FLEXERIL) 10 MG tablet Take 1 tablet (10 mg total) by mouth 3 (three) times daily as needed. for muscle spams   desvenlafaxine (PRISTIQ) 100 MG 24 hr tablet Take 1 tablet (100 mg total) by mouth daily.   Dulaglutide (TRULICITY) 3 MG/0.5ML SOPN Inject 3 mg as directed once a week.   escitalopram (LEXAPRO) 20 MG tablet  Take 20 mg by mouth daily.   esomeprazole (NEXIUM) 40 MG capsule TAKE 1 CAPSULE BY MOUTH ONCE DAILY BEFORE BREAKFAST   gabapentin (NEURONTIN) 300 MG capsule Take 1 capsule (300 mg total) by mouth 3 (three) times daily.   glimepiride (AMARYL) 2 MG tablet Take 1 tablet (2 mg total) by mouth daily with breakfast.   ibuprofen (ADVIL) 800 MG tablet Take 1 tablet (800 mg total) by mouth 2 (two) times daily.   levocetirizine (XYZAL) 5 MG tablet Take 1 tablet (5 mg total) by mouth at bedtime as needed (allergic reaction/ itching).   levothyroxine (SYNTHROID) 125 MCG tablet Take 1 tablet (125 mcg total) by mouth daily before breakfast.   losartan (COZAAR) 100 MG tablet Take 1 tablet by mouth once daily   metFORMIN (GLUCOPHAGE) 1000 MG tablet Take 1 tablet (1,000 mg total) by mouth 2 (two) times daily with a meal. (NEEDS TO BE SEEN BEFORE NEXT REFILL)   predniSONE (DELTASONE) 20 MG tablet Take 2 tablets (40 mg total) by mouth daily with breakfast for 5 days.   rosuvastatin (CRESTOR) 10 MG tablet Take 1 tablet (10 mg total) by mouth daily.   sulfamethoxazole-trimethoprim (BACTRIM DS) 800-160 MG tablet Take 1 tablet by mouth 2 (two) times daily for 7 days.   vitamin B-12 (CYANOCOBALAMIN) 1000 MCG tablet Take 1,000 mcg by mouth in the morning.    Vitamin D, Ergocalciferol, (DRISDOL) 1.25 MG (50000 UNIT) CAPS capsule Take 1 capsule (50,000 Units total) by mouth every 7 (seven) days.   zinc gluconate 50 MG tablet Take 50 mg by mouth in the morning.   [DISCONTINUED] budesonide (PULMICORT) 0.5 MG/2ML nebulizer solution Take 2 mLs (0.5 mg total) by nebulization 2 (two) times daily. (Patient taking differently: Take 0.5 mg by nebulization 2 (two) times daily as needed (wheezing/shortness of breath.).)   [DISCONTINUED] Glycopyrrolate-Formoterol (BEVESPI AEROSPHERE) 9-4.8 MCG/ACT AERO Inhale 2 puffs into the lungs 2 (two) times daily.   No facility-administered encounter medications on file as of 02/03/2023.    Allergies  Allergen Reactions   Dilaudid [Hydromorphone]     Itching and vomitting   Doxycycline Itching   Valproic Acid And Related Nausea And Vomiting    Review of Systems  Respiratory:  Positive for shortness of breath.    As per HPI  Objective:  BP 138/74   Pulse 77   Temp 98 F (36.7 C)   Ht 5\' 4"  (1.626 m)   Wt 201 lb (91.2 kg)   SpO2 97%   BMI 34.50 kg/m    Wt Readings from Last 3 Encounters:  02/03/23 201 lb (91.2 kg)  11/29/22 200 lb 12.8 oz (91.1 kg)  11/17/22 200 lb 3.2 oz (90.8 kg)   Physical Exam Constitutional:      General: She is awake. She is not in acute distress.    Appearance: Normal appearance. She is well-developed and well-groomed. She is obese. She is not ill-appearing, toxic-appearing or diaphoretic.  Cardiovascular:     Rate and Rhythm: Normal rate.     Pulses: Normal pulses.          Radial pulses are 2+ on the right side and 2+ on the left side.       Posterior tibial pulses are 2+ on the right side and 2+ on the left side.     Heart sounds: Normal heart sounds. No murmur heard.    No gallop.  Pulmonary:     Effort: Pulmonary effort is normal. No respiratory distress.  Breath sounds: No stridor. Examination of the right-upper field reveals decreased breath sounds. Examination of  the left-upper field reveals decreased breath sounds. Examination of the right-middle field reveals decreased breath sounds. Examination of the left-middle field reveals decreased breath sounds. Examination of the left-lower field reveals decreased breath sounds. Decreased breath sounds present. No wheezing, rhonchi or rales.  Musculoskeletal:     Cervical back: Full passive range of motion without pain and neck supple.     Right lower leg: No edema.     Left lower leg: No edema.  Skin:    General: Skin is warm.     Capillary Refill: Capillary refill takes less than 2 seconds.          Comments: ~1/2 inch diameter abscess on left buttock, with 5 mm central opening with scab and surrounding area of erythema. Tender to palpation.   Neurological:     General: No focal deficit present.     Mental Status: She is alert, oriented to person, place, and time and easily aroused. Mental status is at baseline.     GCS: GCS eye subscore is 4. GCS verbal subscore is 5. GCS motor subscore is 6.     Motor: No weakness.  Psychiatric:        Attention and Perception: Attention and perception normal.        Mood and Affect: Mood and affect normal.        Speech: Speech normal.        Behavior: Behavior normal. Behavior is cooperative.        Thought Content: Thought content normal. Thought content does not include homicidal or suicidal ideation. Thought content does not include homicidal or suicidal plan.        Cognition and Memory: Cognition and memory normal.        Judgment: Judgment normal.     Results for orders placed or performed in visit on 11/29/22  Bayer DCA Hb A1c Waived  Result Value Ref Range   HB A1C (BAYER DCA - WAIVED) 6.9 (H) 4.8 - 5.6 %  CMP14+EGFR  Result Value Ref Range   Glucose 151 (H) 70 - 99 mg/dL   BUN 11 6 - 24 mg/dL   Creatinine, Ser 5.78 0.57 - 1.00 mg/dL   eGFR 98 >46 NG/EXB/2.84   BUN/Creatinine Ratio 15 9 - 23   Sodium 139 134 - 144 mmol/L   Potassium 4.8 3.5 - 5.2  mmol/L   Chloride 102 96 - 106 mmol/L   CO2 21 20 - 29 mmol/L   Calcium 9.7 8.7 - 10.2 mg/dL   Total Protein 7.1 6.0 - 8.5 g/dL   Albumin 4.4 3.8 - 4.9 g/dL   Globulin, Total 2.7 1.5 - 4.5 g/dL   Bilirubin Total 0.7 0.0 - 1.2 mg/dL   Alkaline Phosphatase 98 44 - 121 IU/L   AST 30 0 - 40 IU/L   ALT 34 (H) 0 - 32 IU/L       11/29/2022    8:56 AM 09/22/2022    9:20 AM 05/06/2022   12:14 PM 11/11/2021   10:14 AM 05/13/2021   10:09 AM  Depression screen PHQ 2/9  Decreased Interest 1 2 0 1 1  Down, Depressed, Hopeless 1 1 3 1 1   PHQ - 2 Score 2 3 3 2 2   Altered sleeping 3 3 3 3 2   Tired, decreased energy 2 2 3 1 2   Change in appetite 2 2 0 1 2  Feeling bad  or failure about yourself  2 2 3 1 1   Trouble concentrating 2 2 0 1 1  Moving slowly or fidgety/restless 0 2 0 0 1  Suicidal thoughts 0 0 0 1 1  PHQ-9 Score 13 16 12 10 12   Difficult doing work/chores Somewhat difficult Somewhat difficult Somewhat difficult Very difficult Somewhat difficult       09/22/2022    9:21 AM 05/06/2022   12:14 PM 11/11/2021   10:15 AM 05/13/2021   10:10 AM  GAD 7 : Generalized Anxiety Score  Nervous, Anxious, on Edge 0 2 0 0  Control/stop worrying 1 2 1 1   Worry too much - different things 1 2 1 1   Trouble relaxing 1 2 0 1  Restless 1 2 0 0  Easily annoyed or irritable 1 2 0 0  Afraid - awful might happen 0 2 1 1   Total GAD 7 Score 5 14 3 4   Anxiety Difficulty Not difficult at all Somewhat difficult Somewhat difficult Somewhat difficult   Pertinent labs & imaging results that were available during my care of the patient were reviewed by me and considered in my medical decision making.  Assessment & Plan:  Clytee was seen today for shortness of breath.  Diagnoses and all orders for this visit:  Chronic obstructive pulmonary disease with acute exacerbation (HCC) Will start medication as below. Will provided steroid burst for patient and switch to [facility]. Provided sample in office today. Will refer  to pulmonology for patient to follow up. Reviewed CT imaging from 2/24. Patient had stable lung nodule on imaging.  Reviewed notes from Oletta Lamas, pulmonology on 11/08/2019 and 09/21/2018. Recommend patient continue OTC mucinex.  -     predniSONE /(DELTASONE) 20 MG tablet; Take 2 tablets (40 mg total) by mouth daily with breakfast for 5 days. -     Budeson-Glycopyrrol-Formoterol (BREZTRI AEROSPHERE) 160-9-4.8 MCG/ACT AERO; Inhale 2 puffs into the lungs 2 (two) times daily. -     Ambulatory referral to Pulmonology  Abscess Will provided antibiotic as below. Patient has follow up with PCP pending. Instructed patient to monitor for signs and symptoms of worsening infection.  -     sulfamethoxazole-trimethoprim (BACTRIM DS) 800-160 MG tablet; Take 1 tablet by mouth 2 (two) times daily for 7 days.  Depression, major, single episode, mild (HCC) Symptoms stable. Denies SI. Patient to follow up with PCP.   GAD (generalized anxiety disorder) As above.   Continue all other maintenance medications.  Follow up plan: Return for follow up in 2 weeks with PCP.  Continue healthy lifestyle choices, including diet (rich in fruits, vegetables, and lean proteins, and low in salt and simple carbohydrates) and exercise (at least 30 minutes of moderate physical activity daily).  Written and verbal instructions provided   The above assessment and management plan was discussed with the patient. The patient verbalized understanding of and has agreed to the management plan. Patient is aware to call the clinic if they develop any new symptoms or if symptoms persist or worsen. Patient is aware when to return to the clinic for a follow-up visit. Patient educated on when it is appropriate to go to the emergency department.   Neale Burly, DNP-[name] [facility] [address] [place], [place] [address] [phone]

## 2023-02-06 DIAGNOSIS — Z7951 Long term (current) use of inhaled steroids: Secondary | ICD-10-CM | POA: Diagnosis not present

## 2023-02-06 DIAGNOSIS — L03317 Cellulitis of buttock: Secondary | ICD-10-CM | POA: Diagnosis not present

## 2023-02-06 DIAGNOSIS — Z87891 Personal history of nicotine dependence: Secondary | ICD-10-CM | POA: Diagnosis not present

## 2023-02-06 DIAGNOSIS — J449 Chronic obstructive pulmonary disease, unspecified: Secondary | ICD-10-CM | POA: Diagnosis not present

## 2023-02-06 DIAGNOSIS — Z7984 Long term (current) use of oral hypoglycemic drugs: Secondary | ICD-10-CM | POA: Diagnosis not present

## 2023-02-06 DIAGNOSIS — E119 Type 2 diabetes mellitus without complications: Secondary | ICD-10-CM | POA: Diagnosis not present

## 2023-02-06 DIAGNOSIS — I10 Essential (primary) hypertension: Secondary | ICD-10-CM | POA: Diagnosis not present

## 2023-02-06 DIAGNOSIS — E079 Disorder of thyroid, unspecified: Secondary | ICD-10-CM | POA: Diagnosis not present

## 2023-02-06 DIAGNOSIS — Z7982 Long term (current) use of aspirin: Secondary | ICD-10-CM | POA: Diagnosis not present

## 2023-02-06 DIAGNOSIS — E785 Hyperlipidemia, unspecified: Secondary | ICD-10-CM | POA: Diagnosis not present

## 2023-02-06 DIAGNOSIS — Z5941 Food insecurity: Secondary | ICD-10-CM | POA: Diagnosis not present

## 2023-02-15 ENCOUNTER — Telehealth: Payer: Self-pay

## 2023-02-15 NOTE — Telephone Encounter (Signed)
Pharmacy Patient Advocate Encounter  Received notification from [facility] that Prior Authorization for [facility] Aerosphere 160-9-4.8MCG/ACT aerosol has been APPROVED from 02/15/23 to 02/15/24   PA #/Case ID/Reference #:  [id number]

## 2023-02-15 NOTE — Telephone Encounter (Signed)
Dorisann Frames Mcbride-Paschen (KeyArne [place]) PA Case ID #: [id number] Rx #: [id number] Need Help? Call us at [phone] Status sent iconSent to Plan today Drug Breztri Aerosphere 160-9-4.8MCG/ACT aerosol ePA cloud logo Form [facility] [place] Form [phone] NCPDP) Original Claim Info [phone]

## 2023-02-24 ENCOUNTER — Ambulatory Visit: Payer: Medicaid Other | Admitting: Family

## 2023-02-24 ENCOUNTER — Encounter: Payer: Self-pay | Admitting: Family

## 2023-02-24 VITALS — BP 116/63 | HR 79 | Temp 97.1°F | Ht 64.0 in | Wt 203.0 lb

## 2023-02-24 DIAGNOSIS — E669 Obesity, unspecified: Secondary | ICD-10-CM

## 2023-02-24 DIAGNOSIS — G8929 Other chronic pain: Secondary | ICD-10-CM | POA: Diagnosis not present

## 2023-02-24 DIAGNOSIS — M5442 Lumbago with sciatica, left side: Secondary | ICD-10-CM

## 2023-02-24 DIAGNOSIS — I152 Hypertension secondary to endocrine disorders: Secondary | ICD-10-CM | POA: Diagnosis not present

## 2023-02-24 DIAGNOSIS — E890 Postprocedural hypothyroidism: Secondary | ICD-10-CM

## 2023-02-24 DIAGNOSIS — E1159 Type 2 diabetes mellitus with other circulatory complications: Secondary | ICD-10-CM

## 2023-02-24 DIAGNOSIS — E785 Hyperlipidemia, unspecified: Secondary | ICD-10-CM | POA: Diagnosis not present

## 2023-02-24 DIAGNOSIS — J441 Chronic obstructive pulmonary disease with (acute) exacerbation: Secondary | ICD-10-CM

## 2023-02-24 DIAGNOSIS — M5441 Lumbago with sciatica, right side: Secondary | ICD-10-CM | POA: Diagnosis not present

## 2023-02-24 DIAGNOSIS — E1169 Type 2 diabetes mellitus with other specified complication: Secondary | ICD-10-CM | POA: Diagnosis not present

## 2023-02-24 DIAGNOSIS — F320 Major depressive disorder, single episode, mild: Secondary | ICD-10-CM

## 2023-02-24 DIAGNOSIS — K219 Gastro-esophageal reflux disease without esophagitis: Secondary | ICD-10-CM | POA: Diagnosis not present

## 2023-02-24 DIAGNOSIS — F411 Generalized anxiety disorder: Secondary | ICD-10-CM | POA: Diagnosis not present

## 2023-02-24 LAB — BAYER DCA HB A1C WAIVED: HB A1C (BAYER DCA - WAIVED): 6.9 % — ABNORMAL HIGH (ref 4.8–5.6)

## 2023-02-24 NOTE — Patient Instructions (Signed)
 Chronic Obstructive Pulmonary Disease Exacerbation  Chronic obstructive pulmonary disease (COPD) is a long-term (chronic) lung problem. When you have COPD, it can feel harder to breathe in or out. COPD exacerbation is a flare-up of symptoms when breathing gets worse and more treatment may be needed. Without treatment, flare-ups can be life-threatening. If they happen often, your lungs can become more damaged. What are the causes? Not taking your usual COPD medicines as told by your health care provider. A cold or the flu, which can cause infection in your lungs. Being exposed to things that make your breathing worse, such as: Smoke. Air pollution. Fumes. Dust. Allergies. Weather changes. What are the signs or symptoms? Symptoms do not get better or get worse even if you take your medicines as told by your provider. Symptoms may include: More shortness of breath. You may only be able to speak one or two words at a time. More coughing or mucus from your lungs. More wheezing or chest tightness. Being more tired and having less energy. Confusion. How is this diagnosed? This condition is diagnosed based on: Symptoms that get worse. Your medical history. A physical exam. You may also have tests, including: A chest X-ray. Blood or mucus tests. How is this treated? You may be able to stay home or you may need to go to the hospital. Treatment may include: Taking medicines. These may include: Inhalers. These have medicines in them that you breathe in. These may be more of what you already take or they may be new. Steroids. These reduce inflammation in the airways. These may be inhaled, taken by mouth, or given in an IV. Antibiotics. These treat infection. Using oxygen. Using a device to help you clear mucus. Follow these instructions at home: Medicines Take your medicines only as told by your provider. If you were given antibiotics or steroids, take them as told by your provider. Do  not stop taking them even if you start to feel better. Lifestyle Several times a day, wash your hands with soap and water for at least 20 seconds. If you cannot use soap and water, use hand sanitizer. This may help keep you from getting an infection. Avoid being around crowds or people who are sick. Do not smoke or use any products that contain nicotine or tobacco. If you need help quitting, ask your provider. Return to your normal activities when your provider says that it's safe. Use breathing methods to control your stress and catch your breath. How is this prevented? Follow your COPD action plan. The action plan tells you what to do if you're feeling good and what to do when you start feeling worse. Discuss the plan often with your provider. Make sure you get all the shots, also called vaccines, that your provider recommends. Ask your provider about a flu shot and a pneumonia shot. Use oxygen therapy if told by your provider. If you need home oxygen therapy, ask your provider how often to check your oxygen level with a device called an oximeter. Keep all follow-up visits to review your COPD action plan. Your provider will want to check on your condition often to keep you healthy and out of the hospital. Contact a health care provider if: Your COPD symptoms get worse. You have a fever or chills. You have trouble doing daily activities. You have trouble breathing even when you are resting. Get help right away if: You are short of breath and cannot: Talk in full sentences. Do normal activities. You have chest  pain. You feel confused. These symptoms may be an emergency. Call 911 right away. Do not wait to see if the symptoms will go away. Do not drive yourself to the hospital. This information is not intended to replace advice given to you by your health care provider. Make sure you discuss any questions you have with your health care provider. Document Revised: 07/04/2022 Document  Reviewed: 07/04/2022 Elsevier Patient Education  2024 [employer].

## 2023-02-24 NOTE — Progress Notes (Signed)
Subjective:    Patient ID: Sherry Glass, female    DOB: 20-Jul-1970, 52 y.o.   MRN: [id number]  Chief Complaint  Patient presents with   Medical Management of Chronic Issues   COPD    Breathing has gotten    Diabetes   Pt presents to the office today for chronic follow up.    Pt has seen Pulmonologist for COPD, Restrictive lung disease, and SOB, but has been cleared.  She had thyroid ablation.   She is followed by [facility] as needed for chronic back pain. Had a MRI 05/22/22.    She is obese with a BMI of 34 and DM and HTN.   COPD She complains of cough, hoarse voice and shortness of breath. There is no wheezing. This is a chronic problem. The current episode started more than 1 year ago. The problem occurs intermittently. Associated symptoms include heartburn and malaise/fatigue. Her symptoms are alleviated by rest. She reports moderate improvement on treatment. Her past medical history is significant for COPD.  Diabetes She presents for her follow-up diabetic visit. She has type 2 diabetes mellitus. Hypoglycemia symptoms include nervousness/anxiousness. Pertinent negatives for diabetes include no blurred vision and no foot paresthesias. Symptoms are stable. Risk factors for coronary artery disease include diabetes mellitus, dyslipidemia, hypertension and sedentary lifestyle. She is following a generally unhealthy diet. Her overall blood glucose range is 130-140 mg/dl.  Hypertension This is a chronic problem. The current episode started more than 1 year ago. The problem has been resolved since onset. Associated symptoms include anxiety, malaise/fatigue and shortness of breath. Pertinent negatives include no blurred vision or peripheral edema. Risk factors for coronary artery disease include dyslipidemia, diabetes mellitus and sedentary lifestyle. The current treatment provides moderate improvement.  Gastroesophageal Reflux She complains of belching, coughing, heartburn and a hoarse  voice. She reports no wheezing. This is a chronic problem. The current episode started more than 1 year ago. The problem occurs rarely. She has tried a PPI for the symptoms. The treatment provided moderate relief.  Hyperlipidemia This is a chronic problem. The current episode started more than 1 year ago. The problem is controlled. Exacerbating diseases include obesity. Associated symptoms include shortness of breath. Current antihyperlipidemic treatment includes statins. The current treatment provides moderate improvement of lipids. Risk factors for coronary artery disease include dyslipidemia, diabetes mellitus, hypertension, a sedentary lifestyle and post-menopausal.  Back Pain This is a chronic problem. The current episode started more than 1 year ago. The problem occurs intermittently. The problem has been waxing and waning since onset. The pain is present in the lumbar spine. The quality of the pain is described as aching. The pain is at a severity of 7/10. The pain is moderate. She has tried bed rest and NSAIDs for the symptoms. The treatment provided moderate relief.  Anxiety Presents for follow-up visit. Symptoms include depressed mood, excessive worry, irritability, nervous/anxious behavior and shortness of breath. Patient reports no decreased concentration or suicidal ideas. Symptoms occur occasionally. The severity of symptoms is moderate.    Depression        This is a chronic problem.  The current episode started more than 1 year ago.   The problem occurs intermittently.  Associated symptoms include sad.  Associated symptoms include no decreased concentration, no helplessness, no hopelessness and no suicidal ideas.  Past treatments include SSRIs - Selective serotonin reuptake inhibitors.  Past medical history includes anxiety.       Review of Systems  Constitutional:  Positive for  irritability and malaise/fatigue.  HENT:  Positive for hoarse voice.   Eyes:  Negative for blurred  vision.  Respiratory:  Positive for cough and shortness of breath. Negative for wheezing.   Gastrointestinal:  Positive for heartburn.  Musculoskeletal:  Positive for back pain.  Psychiatric/Behavioral:  Positive for depression. Negative for decreased concentration and suicidal ideas. The patient is nervous/anxious.   All other systems reviewed and are negative.      Objective:   Physical Exam Vitals reviewed.  Constitutional:      General: She is not in acute distress.    Appearance: She is well-developed. She is obese.  HENT:     Head: Normocephalic and atraumatic.     Right Ear: Tympanic membrane normal.     Left Ear: Tympanic membrane normal.  Eyes:     Pupils: Pupils are equal, round, and reactive to light.  Neck:     Thyroid: No thyromegaly.  Cardiovascular:     Rate and Rhythm: Normal rate and regular rhythm.     Heart sounds: Normal heart sounds. No murmur heard. Pulmonary:     Effort: Pulmonary effort is normal. No respiratory distress.     Breath sounds: Normal breath sounds. No wheezing.  Abdominal:     General: Bowel sounds are normal. There is no distension.     Palpations: Abdomen is soft.     Tenderness: There is no abdominal tenderness.  Musculoskeletal:        General: No tenderness. Normal range of motion.     Cervical back: Normal range of motion and neck supple.  Skin:    General: Skin is warm and dry.  Neurological:     Mental Status: She is alert and oriented to person, place, and time.     Cranial Nerves: No cranial nerve deficit.     Deep Tendon Reflexes: Reflexes are normal and symmetric.  Psychiatric:        Behavior: Behavior normal.        Thought Content: Thought content normal.        Judgment: Judgment normal.      BP 116/63   Pulse 79   Temp (!) 97.1 F (36.2 C) (Temporal)   Ht 5\' 4"  (1.626 m)   Wt 203 lb (92.1 kg)   SpO2 98%   BMI 34.84 kg/m      Assessment & Plan:  CHELLSIE HORRIGAN comes in today with chief complaint of  Medical Management of Chronic Issues, COPD (Breathing has gotten ), and Diabetes   Diagnosis and orders addressed:  1. Type 2 diabetes mellitus with other specified complication, without long-term current use of insulin (HCC) - Bayer DCA Hb A1c Waived - CBC with Differential/Platelet - CMP14+EGFR  2. Chronic bilateral low back pain with bilateral sciatica - CBC with Differential/Platelet - CMP14+EGFR  3. Chronic obstructive pulmonary disease with acute exacerbation (HCC) - CBC with Differential/Platelet - CMP14+EGFR - Ambulatory referral to Pulmonology  4. Depression, major, single episode, mild (HCC) - CBC with Differential/Platelet - CMP14+EGFR  5. GAD (generalized anxiety disorder) - CBC with Differential/Platelet - CMP14+EGFR  6. Gastroesophageal reflux disease, unspecified whether esophagitis present - CBC with Differential/Platelet - CMP14+EGFR  7. Hyperlipidemia associated with type 2 diabetes mellitus (HCC) - CBC with Differential/Platelet - CMP14+EGFR  8. Hypertension associated with diabetes (HCC) - CBC with Differential/Platelet - CMP14+EGFR  9. Hypothyroidism following radioiodine therapy - CBC with Differential/Platelet - CMP14+EGFR - TSH  10. Obesity (BMI 30-39.9) - CBC with Differential/Platelet - CMP14+EGFR  Labs pending Continue current medications  Health Maintenance reviewed Diet and exercise encouraged  Follow up plan: 3 months    Jannifer Rodney, [name]

## 2023-02-25 LAB — CBC WITH DIFFERENTIAL/PLATELET
Basophils Absolute: 0 10*3/uL (ref 0.0–0.2)
Basos: 0 %
EOS (ABSOLUTE): 0.1 10*3/uL (ref 0.0–0.4)
Eos: 1 %
Hematocrit: 41.9 % (ref 34.0–46.6)
Hemoglobin: 13.8 g/dL (ref 11.1–15.9)
Immature Grans (Abs): 0 10*3/uL (ref 0.0–0.1)
Immature Granulocytes: 0 %
Lymphocytes Absolute: 3.7 10*3/uL — ABNORMAL HIGH (ref 0.7–3.1)
Lymphs: 37 %
MCH: 31.9 pg (ref 26.6–33.0)
MCHC: 32.9 g/dL (ref 31.5–35.7)
MCV: 97 fL (ref 79–97)
Monocytes Absolute: 0.5 10*3/uL (ref 0.1–0.9)
Monocytes: 5 %
Neutrophils Absolute: 5.5 10*3/uL (ref 1.4–7.0)
Neutrophils: 57 %
Platelets: 280 10*3/uL (ref 150–450)
RBC: 4.32 x10E6/uL (ref 3.77–5.28)
RDW: 13.1 % (ref 11.7–15.4)
WBC: 9.9 10*3/uL (ref 3.4–10.8)

## 2023-02-25 LAB — CMP14+EGFR
ALT: 30 IU/L (ref 0–32)
AST: 23 IU/L (ref 0–40)
Albumin: 4.5 g/dL (ref 3.8–4.9)
Alkaline Phosphatase: 94 [IU]/L (ref 44–121)
BUN/Creatinine Ratio: 16 (ref 9–23)
BUN: 11 mg/dL (ref 6–24)
Bilirubin Total: 0.7 mg/dL (ref 0.0–1.2)
CO2: 20 mmol/L (ref 20–29)
Calcium: 10.2 mg/dL (ref 8.7–10.2)
Chloride: 104 mmol/L (ref 96–106)
Creatinine, Ser: 0.69 mg/dL (ref 0.57–1.00)
Globulin, Total: 2.6 g/dL (ref 1.5–4.5)
Glucose: 150 mg/dL — ABNORMAL HIGH (ref 70–99)
Potassium: 4.9 mmol/L (ref 3.5–5.2)
Sodium: 141 mmol/L (ref 134–144)
Total Protein: 7.1 g/dL (ref 6.0–8.5)
eGFR: 103 mL/min/{1.73_m2} (ref >59)

## 2023-02-25 LAB — TSH: TSH: 1.57 u[IU]/mL (ref 0.450–4.500)

## 2023-03-08 DIAGNOSIS — L400 Psoriasis vulgaris: Secondary | ICD-10-CM | POA: Diagnosis not present

## 2023-03-08 DIAGNOSIS — L4050 Arthropathic psoriasis, unspecified: Secondary | ICD-10-CM | POA: Diagnosis not present

## 2023-03-11 ENCOUNTER — Other Ambulatory Visit: Payer: Self-pay | Admitting: Family

## 2023-03-11 DIAGNOSIS — G8929 Other chronic pain: Secondary | ICD-10-CM

## 2023-03-11 DIAGNOSIS — M25552 Pain in left hip: Secondary | ICD-10-CM

## 2023-03-13 NOTE — Telephone Encounter (Signed)
Last office visit 02/24/23  Lexapro listed as historical, requires provider approval Cyclobenzaprine last refill 09/22/22, #90, 1 refill  Others are due for refill

## 2023-03-22 DIAGNOSIS — E890 Postprocedural hypothyroidism: Secondary | ICD-10-CM | POA: Diagnosis not present

## 2023-03-23 LAB — T4, FREE: Free T4: 1.23 ng/dL (ref 0.82–1.77)

## 2023-03-23 LAB — LIPID PANEL
Chol/HDL Ratio: 4.4 {ratio} (ref 0.0–4.4)
Cholesterol, Total: 199 mg/dL (ref 100–199)
HDL: 45 mg/dL (ref >39)
LDL Chol Calc (NIH): 105 mg/dL — ABNORMAL HIGH (ref 0–99)
Triglycerides: 286 mg/dL — ABNORMAL HIGH (ref 0–149)
VLDL Cholesterol Cal: 49 mg/dL — ABNORMAL HIGH (ref 5–40)

## 2023-03-23 LAB — TSH: TSH: 3.45 u[IU]/mL (ref 0.450–4.500)

## 2023-03-29 ENCOUNTER — Ambulatory Visit: Payer: Medicaid Other | Admitting: "Endocrinology

## 2023-03-29 ENCOUNTER — Encounter: Payer: Self-pay | Admitting: "Endocrinology

## 2023-03-29 VITALS — BP 138/72 | HR 96 | Ht 64.0 in | Wt 206.8 lb

## 2023-03-29 DIAGNOSIS — E890 Postprocedural hypothyroidism: Secondary | ICD-10-CM | POA: Diagnosis not present

## 2023-03-29 MED ORDER — LEVOTHYROXINE SODIUM 125 MCG PO TABS: 125.0000 ug | ORAL_TABLET | Freq: Every day | ORAL | 3 refills | Status: DC

## 2023-03-29 NOTE — Progress Notes (Signed)
03/29/2023    Endocrinology follow-up note   Subjective:    Patient ID: Sherry Glass, female    DOB: 1970-06-27, PCP Junie Spencer, [name].   Past Medical History:  Diagnosis Date   Allergic rhinitis    Anxiety and depression    Asthma    pt states this doesn't really bother her anymore   Cataract    Chest pain syndrome    COPD (chronic obstructive pulmonary disease) (HCC)    Depression    Diabetes mellitus without complication (HCC)    History of kidney stones    HTN (hypertension)    Hypercholesterolemia    Hyperglycemia    Hypertension    Hypothyroidism    N&V (nausea and vomiting) 01/08/2021   Obesity    Restless leg syndrome    Tobacco abuse    Past Surgical History:  Procedure Laterality Date   ABDOMINAL HYSTERECTOMY     CARDIAC CATHETERIZATION  2014   CATARACT EXTRACTION Right 2023   CATARACT EXTRACTION W/PHACO Left 06/23/2016   Procedure: CATARACT EXTRACTION PHACO AND INTRAOCULAR LENS PLACEMENT (IOC);  Surgeon: Gemma Payor, MD;  Location: [facility];  Service: Ophthalmology;  Laterality: Left;  left - pt knows to arrive at 10:50 cde 9.11   CESAREAN SECTION     CHOLECYSTECTOMY     COLONOSCOPY WITH PROPOFOL N/A 10/14/2019   Procedure: COLONOSCOPY WITH PROPOFOL;  Surgeon: Corbin Ade, MD;  Location: [facility];  Service: Endoscopy;  Laterality: N/A;  8:00   ESOPHAGOGASTRODUODENOSCOPY (EGD) WITH PROPOFOL N/A 02/01/2021   Surgeon: Corbin Ade, MD; normal exam   EXCISION MASS NECK     cyst   left foot surgery     removal of cyst   POLYPECTOMY  10/14/2019   Procedure: POLYPECTOMY;  Surgeon: Corbin Ade, MD;  Location: [facility];  Service: Endoscopy;;   SHOULDER ARTHROSCOPY Left 08/05/2021   Procedure: left shoulder arthroscopy, manipulation under anesthesia, rotator interval release;  Surgeon: Cammy Copa, MD;  Location: [facility];  Service: Orthopedics;  Laterality: Left;   THYROID SURGERY     Dr. Lovell Sheehan   TONSILLECTOMY     Social  History   Socioeconomic History   Marital status: Widowed    Spouse name: Not on file   Number of children: 4   Years of education: Not on file   Highest education level: 12th grade  Occupational History   Not on file  Tobacco Use   Smoking status: Former    Current packs/day: 0.00    Average packs/day: 1 pack/day for 28.0 years (28.0 ttl pk-yrs)    Types: Cigarettes    Start date: 11/13/1988    Quit date: 10/28/2016    Years since quitting: 6.4   Smokeless tobacco: Never  Vaping Use   Vaping status: Never Used  Substance and Sexual Activity   Alcohol use: Not Currently    Comment: For special occasions, very rare    Drug use: No   Sexual activity: Never    Birth control/protection: Surgical  Other Topics Concern   Not on file  Social History Narrative   [facility] (11/18/16):   Originally from [place] but grew up in [place]. Cared for her husband until he passed. 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 Does have significant dust exposure. Has 3 dogs currently. Previously had a parakeet as a child. No mold exposure. Enjoys playing with her grandchildren.  Social Determinants of Health   Financial Resource Strain: Medium Risk (09/21/2022)   Overall Financial Resource Strain (CARDIA)    Difficulty of Paying Living Expenses: Somewhat hard  Food Insecurity: Food Insecurity Present (09/21/2022)   Hunger Vital Sign    Worried About Running Out of Food in the Last Year: Sometimes true    Ran Out of Food in the Last Year: Sometimes true  Transportation Needs: No Transportation Needs (09/21/2022)   PRAPARE - Administrator, Civil Service (Medical): No    Lack of Transportation (Non-Medical): No  Physical Activity: Insufficiently Active (09/21/2022)   Exercise Vital Sign    Days of Exercise per Week: 2 days    Minutes of Exercise per Session: 30 min  Stress: Stress Concern Present (09/21/2022)   [employer] of  Occupational Health - Occupational Stress Questionnaire    Feeling of Stress : To some extent  Social Connections: Unknown (09/21/2022)   Social Connection and Isolation Panel [NHANES]    Frequency of Communication with Friends and Family: Twice a week    Frequency of Social Gatherings with Friends and Family: Three times a week    Attends Religious Services: Patient declined    Active Member of Clubs or Organizations: No    Attends Banker Meetings: Not on file    Marital Status: Widowed   Outpatient Encounter Medications as of 03/29/2023  Medication Sig   albuterol (VENTOLIN HFA) 108 (90 Base) MCG/ACT inhaler Inhale 2 puffs into the lungs every 6 (six) hours as needed for wheezing or shortness of breath.   aspirin EC 81 MG tablet Take one po qd   Budeson-Glycopyrrol-Formoterol (BREZTRI AEROSPHERE) 160-9-4.8 MCG/ACT AERO Inhale 2 puffs into the lungs 2 (two) times daily.   cyclobenzaprine (FLEXERIL) 10 MG tablet Take 1 tablet by mouth three times daily as needed for muscle spasm   desvenlafaxine (PRISTIQ) 100 MG 24 hr tablet Take 1 tablet (100 mg total) by mouth daily.   escitalopram (LEXAPRO) 20 MG tablet Take 1 tablet by mouth once daily   esomeprazole (NEXIUM) 40 MG capsule TAKE 1 CAPSULE BY MOUTH ONCE DAILY BEFORE BREAKFAST   gabapentin (NEURONTIN) 300 MG capsule Take 1 capsule (300 mg total) by mouth 3 (three) times daily.   glimepiride (AMARYL) 2 MG tablet Take 1 tablet (2 mg total) by mouth daily with breakfast.   ibuprofen (ADVIL) 800 MG tablet Take 1 tablet by mouth twice daily   levocetirizine (XYZAL) 5 MG tablet Take 1 tablet (5 mg total) by mouth at bedtime as needed (allergic reaction/ itching).   levothyroxine (SYNTHROID) 125 MCG tablet Take 1 tablet (125 mcg total) by mouth daily before breakfast.   losartan (COZAAR) 100 MG tablet Take 1 tablet by mouth once daily   metFORMIN (GLUCOPHAGE) 1000 MG tablet Take 1 tablet (1,000 mg total) by mouth 2 (two) times daily  with a meal. (NEEDS TO BE SEEN BEFORE NEXT REFILL)   rosuvastatin (CRESTOR) 10 MG tablet Take 1 tablet (10 mg total) by mouth daily.   TALTZ 80 MG/ML SOSY Inject into the skin every 30 (thirty) days.   TRULICITY 3 MG/0.5ML SOAJ INJECT 3 MG SUBCUTANEOUSLY ONCE A WEEK   vitamin B-12 (CYANOCOBALAMIN) 1000 MCG tablet Take 1,000 mcg by mouth in the morning.   Vitamin D, Ergocalciferol, (DRISDOL) 1.25 MG (50000 UNIT) CAPS capsule Take 1 capsule (50,000 Units total) by mouth every 7 (seven) days.   zinc gluconate 50 MG tablet Take 50 mg by mouth in the  morning.   [DISCONTINUED] levothyroxine (SYNTHROID) 125 MCG tablet Take 1 tablet (125 mcg total) by mouth daily before breakfast.   No facility-administered encounter medications on file as of 03/29/2023.   ALLERGIES: Allergies  Allergen Reactions   Dilaudid [Hydromorphone]     Itching and vomitting   Doxycycline Itching   Valproic Acid And Related Nausea And Vomiting   VACCINATION STATUS: Immunization History  Administered Date(s) Administered   Influenza,inj,Quad PF,6+ Mos 04/27/2017   Pneumococcal Polysaccharide-23 05/02/2005    HPI  Mrs. Lite is status post radioactive iodine thyroid ablation on June 29, 2018.  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   She received RAI thyroid ablation to treat primary hyperthyroidism on June 29, 2018.  She was subsequently initiated on  Levothyroxine.  She remains on levothyroxine 125 mcg p.o. daily before breakfast with good consistency.  Her previsit labs are consistent with appropriate replacement.   She The patient denies family history of thyroid dysfunction- she is adopted. -She denies palpitations, tremors, nor heat intolerance. - She has well-controlled type 2 diabetes with A1c of 6.9% improving from 7.8 %  following with her PMD, taking Metformin, Trulicity 1.5 mg subcutaneously weekly, metformin 1000 mg p.o. twice daily, glimepiride 2 mg p.o.  daily.  - She reports that she has quit smoking after decades of heavy smoking.     Review of Systems. Limited as above   Objective:    BP 138/72   Pulse 96   Ht 5\' 4"  (1.626 m)   Wt 206 lb 12.8 oz (93.8 kg)   BMI 35.50 kg/m   Wt Readings from Last 3 Encounters:  03/29/23 206 lb 12.8 oz (93.8 kg)  02/24/23 203 lb (92.1 kg)  02/03/23 201 lb (91.2 kg)    Physical Exam   Lipid Panel     Component Value Date/Time   CHOL 199 03/22/2023 1047   TRIG 286 (H) 03/22/2023 1047   HDL 45 03/22/2023 1047   CHOLHDL 4.4 03/22/2023 1047   LDLCALC 105 (H) 03/22/2023 1047   Recent Results (from the past 2160 hour(s))  Bayer DCA Hb A1c Waived     Status: Abnormal   Collection Time: 02/24/23 10:30 AM  Result Value Ref Range   HB A1C (BAYER DCA - WAIVED) 6.9 (H) 4.8 - 5.6 %    Comment:          Prediabetes: 5.7 - 6.4          Diabetes: >6.4          Glycemic control for adults with diabetes: <7.0   CBC with Differential/Platelet     Status: Abnormal   Collection Time: 02/24/23 10:32 AM  Result Value Ref Range   WBC 9.9 3.4 - 10.8 x10E3/uL   RBC 4.32 3.77 - 5.28 x10E6/uL   Hemoglobin 13.8 11.1 - 15.9 g/dL   Hematocrit 16.1 09.6 - 46.6 %   MCV 97 79 - 97 fL   MCH 31.9 26.6 - 33.0 pg   MCHC 32.9 31.5 - 35.7 g/dL   RDW 04.5 40.9 - 81.1 %   Platelets 280 150 - 450 x10E3/uL   Neutrophils 57 Not Estab. %   Lymphs 37 Not Estab. %   Monocytes 5 Not Estab. %   Eos 1 Not Estab. %   Basos 0 Not Estab. %   Neutrophils Absolute 5.5 1.4 - 7.0 x10E3/uL   Lymphocytes Absolute 3.7 (H) 0.7 - 3.1 x10E3/uL   Monocytes Absolute 0.5 0.1 - 0.9  x10E3/uL   EOS (ABSOLUTE) 0.1 0.0 - 0.4 x10E3/uL   Basophils Absolute 0.0 0.0 - 0.2 x10E3/uL   Immature Granulocytes 0 Not Estab. %   Immature Grans (Abs) 0.0 0.0 - 0.1 x10E3/uL  CMP14+EGFR     Status: Abnormal   Collection Time: 02/24/23 10:32 AM  Result Value Ref Range   Glucose 150 (H) 70 - 99 mg/dL   BUN 11 6 - 24 mg/dL   Creatinine, Ser 6.57 0.57  - 1.00 mg/dL   eGFR 846 >96 EX/BMW/4.13   BUN/Creatinine Ratio 16 9 - 23   Sodium 141 134 - 144 mmol/L   Potassium 4.9 3.5 - 5.2 mmol/L   Chloride 104 96 - 106 mmol/L   CO2 20 20 - 29 mmol/L   Calcium 10.2 8.7 - 10.2 mg/dL   Total Protein 7.1 6.0 - 8.5 g/dL   Albumin 4.5 3.8 - 4.9 g/dL   Globulin, Total 2.6 1.5 - 4.5 g/dL   Bilirubin Total 0.7 0.0 - 1.2 mg/dL   Alkaline Phosphatase 94 44 - 121 IU/L   AST 23 0 - 40 IU/L   ALT 30 0 - 32 IU/L  TSH     Status: None   Collection Time: 02/24/23 10:32 AM  Result Value Ref Range   TSH 1.570 0.450 - 4.500 uIU/mL  TSH     Status: None   Collection Time: 03/22/23 10:47 AM  Result Value Ref Range   TSH 3.450 0.450 - 4.500 uIU/mL  T4, free     Status: None   Collection Time: 03/22/23 10:47 AM  Result Value Ref Range   Free T4 1.23 0.82 - 1.77 ng/dL  Lipid panel     Status: Abnormal   Collection Time: 03/22/23 10:47 AM  Result Value Ref Range   Cholesterol, Total 199 100 - 199 mg/dL   Triglycerides 244 (H) 0 - 149 mg/dL   HDL 45 >01 mg/dL   VLDL Cholesterol Cal 49 (H) 5 - 40 mg/dL   LDL Chol Calc (NIH) 027 (H) 0 - 99 mg/dL   Chol/HDL Ratio 4.4 0.0 - 4.4 ratio    Comment:                                   T. Chol/HDL Ratio                                             Men  Women                               1/2 Avg.Risk  3.4    3.3                                   Avg.Risk  5.0    4.4                                2X Avg.Risk  9.6    7.1                                3X Avg.Risk  23.4   11.0      - Thyroid uptake and scan on 07/19/2016 was nonfocal with 25% uptake and 24 hours.  March 05, 2018 thyroid uptake and scan: FINDINGS: Normal thyroid scan.  No hot or cold nodules are identified.   4 hour I-131 uptake = 17.6% (normal 5-20%)  24 hour I-131 uptake = 30.0% (normal 10-30%)   IMPRESSION: 1. Normal thyroid scan. 2. Top-normal 24 hour iodine 131 uptake at 30%.  I-131 thyroid ablation on June 29, 2018.    Assessment & Plan:   1.  RAI induced hypothyroidism  2.  Type 2 diabetes -She is status post thyroid ablation with I-131 for significant T3 toxicosis.  Treatment was administered on June 29, 2018.   -Her previsit thyroid function tests are consistent with appropriate replacement.  She is advised to continue levothyroxine 125 mcg p.o. daily before breakfast.    - We discussed about the correct intake of her thyroid hormone, on empty stomach at fasting, with water, separated by at least 30 minutes from breakfast and other medications,  and separated by more than 4 hours from calcium, iron, multivitamins, acid reflux medications (PPIs). -Patient is made aware of the fact that thyroid hormone replacement is needed for life, dose to be adjusted by periodic monitoring of thyroid function tests.  She has type 2 diabetes with recent A1c of 6.9% improving from 7.8 %.  She is following with her PCP for diabetes management currently on metformin 1000 mg twice daily, glimepiride 2 mg p.o. daily and Trulicity 1.5 mg subcutaneously weekly.      She wishes to continue follow-up with her PCP for diabetes care.  - I advised patient to maintain close follow up with Junie Spencer, [name] for primary care needs, including diabetes care.   I spent  22  minutes in the care of the patient today including review of labs from Thyroid Function, CMP, and other relevant labs ; imaging/biopsy records (current and previous including abstractions from other facilities); face-to-face time discussing  her lab results and symptoms, medications doses, her options of short and long term treatment based on the latest standards of care / guidelines;   and documenting the encounter.  Daine Floras  participated in the discussions, expressed understanding, and voiced agreement with the above plans.  All questions were answered to her satisfaction. she is encouraged to contact clinic should she have any questions or  concerns prior to her return visit.   Follow up plan: Return in about 1 year (around 03/28/2024) for F/U with Pre-visit Labs.  Marquis Lunch, MD Phone: [phone]  Fax: [phone]   03/29/2023, 10:06 AM

## 2023-04-12 DIAGNOSIS — Z6836 Body mass index (BMI) 36.0-36.9, adult: Secondary | ICD-10-CM | POA: Diagnosis not present

## 2023-04-12 DIAGNOSIS — M6281 Muscle weakness (generalized): Secondary | ICD-10-CM | POA: Diagnosis not present

## 2023-04-13 ENCOUNTER — Other Ambulatory Visit: Payer: Self-pay | Admitting: Surgery

## 2023-04-13 DIAGNOSIS — M6281 Muscle weakness (generalized): Secondary | ICD-10-CM

## 2023-04-14 ENCOUNTER — Encounter: Payer: Self-pay | Admitting: Surgery

## 2023-04-20 DIAGNOSIS — M47816 Spondylosis without myelopathy or radiculopathy, lumbar region: Secondary | ICD-10-CM | POA: Insufficient documentation

## 2023-04-20 NOTE — Progress Notes (Unsigned)
Sherry Glass, female    DOB: Nov 21, 1970    MRN: [id number]   Brief patient profile:  52  yowf  quit smoking  09/2016 at wt = 200 with no obst on pfts and doe which improved referred to pulmonary clinic in [facility]  04/21/2023 by Sherry Glass for worseing doe x 5 months  and f/u MPNs     History of Present Illness  04/21/2023  Pulmonary/ 1st office eval/ Sherry Glass / [place] [facility]  maint on [place] x one year / off ppi / on gabapentin  Chief Complaint  Patient presents with   COPD   Establish Care    Last seen 2020, EW/RB  Dyspnea:  can still do [employer] / mb 100 and back and has to stop when gets to landing and steps are difficulty also back and legs hurt / go numb.  Cough: more than usual x 5 m dry unpredictable  Sleep: bed is flat / 2 pillows freq waking up cough hour or two later SABA use: no better / makes her cough worse  02: prn  LDSCT:due 06/2023   No obvious day to day or daytime pattern/variability or assoc excess/ purulent sputum or mucus plugs or hemoptysis or cp or chest tightness, subjective wheeze or overt sinus or hb symptoms.    Also denies any obvious fluctuation of symptoms with weather or environmental changes or other aggravating or alleviating factors except as outlined above   No unusual exposure hx or h/o childhood pna/ asthma or knowledge of premature birth.  Current Allergies, Complete Past Medical History, Past Surgical History, Family History, and Social History were reviewed in [employer] record.  ROS  The following are not active complaints unless bolded Hoarseness, sore throat, dysphagia, dental problems, itching, sneezing,  nasal congestion or discharge of excess mucus or purulent secretions, ear ache,   fever, chills, sweats, unintended wt loss or wt gain, classically pleuritic or exertional cp,  orthopnea pnd or arm/hand swelling  or leg swelling, presyncope, palpitations, abdominal pain, anorexia, nausea, vomiting, diarrhea   or change in bowel habits or change in bladder habits, change in stools or change in urine, dysuria, hematuria,  rash, arthralgias, visual complaints, headache, numbness, weakness or ataxia or problems with walking or coordination,  change in mood or  memory.            Outpatient Medications Prior to Visit  Medication Sig Dispense Refill   albuterol (VENTOLIN HFA) 108 (90 Base) MCG/ACT inhaler Inhale 2 puffs into the lungs every 6 (six) hours as needed for wheezing or shortness of breath. 8 g 2   aspirin EC 81 MG tablet Take one po qd 90 tablet 1   Budeson-Glycopyrrol-Formoterol (BREZTRI AEROSPHERE) 160-9-4.8 MCG/ACT AERO Inhale 2 puffs into the lungs 2 (two) times daily. 10.7 g 11   cyclobenzaprine (FLEXERIL) 10 MG tablet Take 1 tablet by mouth three times daily as needed for muscle spasm 90 tablet 0   desvenlafaxine (PRISTIQ) 100 MG 24 hr tablet Take 1 tablet (100 mg total) by mouth daily. 90 tablet 1   escitalopram (LEXAPRO) 20 MG tablet Take 1 tablet by mouth once daily 30 tablet 5   esomeprazole (NEXIUM) 40 MG capsule TAKE 1 CAPSULE BY MOUTH ONCE DAILY BEFORE BREAKFAST 30 capsule 3   gabapentin (NEURONTIN) 300 MG capsule Take 1 capsule (300 mg total) by mouth 3 (three) times daily. 90 capsule 2   glimepiride (AMARYL) 2 MG tablet Take 1 tablet (2 mg total) by  mouth daily with breakfast. 90 tablet 1   ibuprofen (ADVIL) 800 MG tablet Take 1 tablet by mouth twice daily 180 tablet 0   levocetirizine (XYZAL) 5 MG tablet Take 1 tablet (5 mg total) by mouth at bedtime as needed (allergic reaction/ itching). 30 tablet 0   levothyroxine (SYNTHROID) 125 MCG tablet Take 1 tablet (125 mcg total) by mouth daily before breakfast. 95 tablet 3   losartan (COZAAR) 100 MG tablet Take 1 tablet by mouth once daily 90 tablet 1   metFORMIN (GLUCOPHAGE) 1000 MG tablet Take 1 tablet (1,000 mg total) by mouth 2 (two) times daily with a meal. (NEEDS TO BE SEEN BEFORE NEXT REFILL) 180 tablet 1   rosuvastatin  (CRESTOR) 10 MG tablet Take 1 tablet (10 mg total) by mouth daily. 90 tablet 3   TALTZ 80 MG/ML SOSY Inject into the skin every 30 (thirty) days.     TRULICITY 3 MG/0.5ML SOAJ INJECT 3 MG SUBCUTANEOUSLY ONCE A WEEK 4 mL 2   vitamin B-12 (CYANOCOBALAMIN) 1000 MCG tablet Take 1,000 mcg by mouth in the morning.     Vitamin D, Ergocalciferol, (DRISDOL) 1.25 MG (50000 UNIT) CAPS capsule Take 1 capsule (50,000 Units total) by mouth every 7 (seven) days. 12 capsule 3   zinc gluconate 50 MG tablet Take 50 mg by mouth in the morning.     No facility-administered medications prior to visit.    Past Medical History:  Diagnosis Date   Allergic rhinitis    Anxiety and depression    Asthma    pt states this doesn't really bother her anymore   Cataract    Chest pain syndrome    COPD (chronic obstructive pulmonary disease) (HCC)    Depression    Diabetes mellitus without complication (HCC)    History of kidney stones    HTN (hypertension)    Hypercholesterolemia    Hyperglycemia    Hypertension    Hypothyroidism    N&V (nausea and vomiting) 01/08/2021   Obesity    Restless leg syndrome    Tobacco abuse       Objective:     BP 121/60   Pulse 81   Ht 5\' 4"  (1.626 m)   Wt 207 lb (93.9 kg)   SpO2 96%   BMI 35.53 kg/m   SpO2: 96 %      Assessment   No problem-specific Assessment & Plan notes found for this encounter.     Sherry Hughs, MD 04/21/2023

## 2023-04-21 ENCOUNTER — Ambulatory Visit: Payer: Medicaid Other | Admitting: Internal Medicine

## 2023-04-21 ENCOUNTER — Encounter: Payer: Self-pay | Admitting: Internal Medicine

## 2023-04-21 ENCOUNTER — Telehealth: Payer: Medicaid Other | Admitting: Family

## 2023-04-21 ENCOUNTER — Encounter: Payer: Self-pay | Admitting: Family

## 2023-04-21 ENCOUNTER — Ambulatory Visit (HOSPITAL_COMMUNITY)
Admission: RE | Admit: 2023-04-21 | Discharge: 2023-04-21 | Disposition: A | Discharge status: Home / Self Care | Payer: Medicaid Other | Source: Ambulatory Visit | Attending: Internal Medicine | Admitting: Internal Medicine

## 2023-04-21 VITALS — BP 121/60 | HR 81 | Ht 64.0 in | Wt 207.0 lb

## 2023-04-21 DIAGNOSIS — R059 Cough, unspecified: Secondary | ICD-10-CM | POA: Diagnosis not present

## 2023-04-21 DIAGNOSIS — R0609 Other forms of dyspnea: Secondary | ICD-10-CM | POA: Insufficient documentation

## 2023-04-21 DIAGNOSIS — Z7984 Long term (current) use of oral hypoglycemic drugs: Secondary | ICD-10-CM

## 2023-04-21 DIAGNOSIS — R911 Solitary pulmonary nodule: Secondary | ICD-10-CM

## 2023-04-21 DIAGNOSIS — I152 Hypertension secondary to endocrine disorders: Secondary | ICD-10-CM

## 2023-04-21 DIAGNOSIS — R058 Other specified cough: Secondary | ICD-10-CM | POA: Diagnosis not present

## 2023-04-21 DIAGNOSIS — R42 Dizziness and giddiness: Secondary | ICD-10-CM | POA: Diagnosis not present

## 2023-04-21 DIAGNOSIS — F17210 Nicotine dependence, cigarettes, uncomplicated: Secondary | ICD-10-CM | POA: Diagnosis not present

## 2023-04-21 DIAGNOSIS — R5383 Other fatigue: Secondary | ICD-10-CM

## 2023-04-21 DIAGNOSIS — E1159 Type 2 diabetes mellitus with other circulatory complications: Secondary | ICD-10-CM

## 2023-04-21 MED ORDER — FAMOTIDINE 20 MG PO TABS: ORAL_TABLET | ORAL | 11 refills | Status: AC

## 2023-04-21 MED ORDER — GABAPENTIN 100 MG PO CAPS: 100.0000 mg | ORAL_CAPSULE | Freq: Three times a day (TID) | ORAL | 2 refills | Status: DC

## 2023-04-21 MED ORDER — MECLIZINE HCL 25 MG PO TABS: 25.0000 mg | ORAL_TABLET | Freq: Three times a day (TID) | ORAL | 0 refills | Status: DC | PRN

## 2023-04-21 MED ORDER — BUDESONIDE-FORMOTEROL FUMARATE 80-4.5 MCG/ACT IN AERO: INHALATION_SPRAY | RESPIRATORY_TRACT | 12 refills | Status: AC

## 2023-04-21 NOTE — Patient Instructions (Addendum)
Plan A = Automatic = Always=    Symbicort 80 (or Breztri x one puff) Take 2 puffs first thing in am and then another 2 puffs about 12 hours later.   Work on inhaler technique:  relax and gently blow all the way out then take a nice smooth full deep breath back in, triggering the inhaler at same time you start breathing in.  Hold breath in for at least  5 seconds if you can. Blow out symbicort  thru nose. Rinse and gargle with water when done.  If mouth or throat bother you at all,  try brushing teeth/gums/tongue with arm and hammer toothpaste/ make a slurry and gargle and spit out.     Plan B = Backup (to supplement plan A, not to replace it) Only use your albuterol inhaler as a rescue medication to be used if you can't catch your breath by resting or doing a relaxed purse lip breathing pattern.  - The less you use it, the better it will work when you need it. - Ok to use the inhaler up to 2 puffs  every 4 hours if you must but call for appointment if use goes up over your usual need - Don't leave home without it !!  (think of it like the spare tire for your car)    Plan C = Crisis (instead of Plan B but only if Plan B stops working) - only use your albuterol nebulizer if you first try Plan B and it fails to help > ok to use the nebulizer up to every 4 hours but if start needing it regularly call for immediate appointment  Nexium (esomeprzole) 40 mg   Take  30-60 min before first meal of the day and Pepcid (famotidine)  20 mg after supper until return to office - this is the best way to tell whether stomach acid is contributing to your problem.    GERD (REFLUX)  is an extremely common cause of respiratory symptoms just like yours , many times with no obvious heartburn at all.    It can be treated with medication, but also with lifestyle changes including elevation of the head of your bed (ideally with 6 -8inch blocks under the headboard of your bed),  Smoking cessation, avoidance of late meals,  excessive alcohol, and avoid fatty foods, chocolate, peppermint, colas, red wine, and acidic juices such as orange juice.  NO MINT OR MENTHOL PRODUCTS SO NO COUGH DROPS  USE SUGARLESS CANDY INSTEAD (Jolley ranchers or Stover's or Life Savers) or even ice chips will also do - the key is to swallow to prevent all throat clearing. NO OIL BASED VITAMINS - use powdered substitutes.  Avoid fish oil when coughing.   Add gabapentin 100 mg at breakfast for a week then add a dose at supper then a week later add a dose at lunch   Please schedule a follow up office visit in 6 weeks, call sooner if needed with all medications /inhalers/ solutions in hand so we can verify exactly what you are taking. This includes all medications from all doctors and over the counters

## 2023-04-21 NOTE — Progress Notes (Signed)
Virtual Visit Consent   Sherry Glass, you are scheduled for a virtual visit with a [facility] provider today. Just as with appointments in the office, your consent must be obtained to participate. Your consent will be active for this visit and any virtual visit you may have with one of our providers in the next 365 days. If you have a MyChart account, a copy of this consent can be sent to you electronically.  As this is a virtual visit, video technology does not allow for your provider to perform a traditional examination. This may limit your provider's ability to fully assess your condition. If your provider identifies any concerns that need to be evaluated in person or the need to arrange testing (such as labs, EKG, etc.), we will make arrangements to do so. Although advances in technology are sophisticated, we cannot ensure that it will always work on either your end or our end. If the connection with a video visit is poor, the visit may have to be switched to a telephone visit. With either a video or telephone visit, we are not always able to ensure that we have a secure connection.  By engaging in this virtual visit, you consent to the provision of healthcare and authorize for your insurance to be billed (if applicable) for the services provided during this visit. Depending on your insurance coverage, you may receive a charge related to this service.  I need to obtain your verbal consent now. Are you willing to proceed with your visit today? Sherry Glass has provided verbal consent on 04/21/2023 for a virtual visit (video or telephone). Jannifer Rodney, [name]  Date: 04/21/2023 12:00 PM  Virtual Visit via Video Note   I, Jannifer Rodney, connected with  Sherry Glass  ([id number], April 24, 1971) on 04/21/23 at 12:05 PM EST by a video-enabled telemedicine application and verified that I am speaking with the correct person using two identifiers.  Location: Patient: Virtual Visit Location Patient: Other:  car Provider: Virtual Visit Location Provider: Home Office   I discussed the limitations of evaluation and management by telemedicine and the availability of in person appointments. The patient expressed understanding and agreed to proceed.    History of Present Illness: Sherry Glass is a 52 y.o. who identifies as a female who was assigned female at birth, and is being seen today for abnormal BP. Reports the last three days she has felt "off" and has been checking her BP. Reports her readings have been 150's/49-67. She saw her Pulmonologist today and her BP was 121/60.   She reports the last three days she felt weak, dizzy, and light headed. Denies any fevers, new SOB.   HPI: Dizziness This is a new problem. The current episode started in the past 7 days. The problem occurs intermittently. Associated symptoms include fatigue, nausea and vertigo. Pertinent negatives include no arthralgias, change in bowel habit, congestion, coughing, fever, headaches, joint swelling, myalgias, rash, sore throat, swollen glands or vomiting. The symptoms are aggravated by bending. She has tried rest for the symptoms. The treatment provided mild relief.    Problems:  Patient Active Problem List   Diagnosis Date Noted   DOE (dyspnea on exertion) 04/21/2023   Arthropathy of lumbar facet joint 04/20/2023   Chronic bilateral low back pain with sciatica 09/22/2022   Diarrhea 09/09/2022   Adrenal mass (HCC) 11/11/2021   Adhesive capsulitis of left shoulder    Spinal stenosis of lumbar region 07/24/2020   Lumbar radiculitis 11/21/2019  Lumbar spondylosis 11/21/2019   Lumbar stenosis with neurogenic claudication 11/21/2019   Hepatic steatosis 05/23/2019   Psoriasis 05/09/2019   Depression, major, single episode, mild (HCC) 10/12/2018   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   Nocturnal hypoxemia 05/29/2017   GAD (generalized anxiety disorder) 02/13/2017   Restrictive lung disease 01/27/2017    Lung nodule 01/27/2017   GERD (gastroesophageal reflux disease) 11/18/2016   Snoring 11/18/2016   Witnessed episode of apnea 11/18/2016   Chronic seasonal allergic rhinitis 11/18/2016   Restless leg syndrome 11/18/2016   Fibromyalgia 11/18/2016   Hyperlipidemia associated with type 2 diabetes mellitus (HCC) 06/09/2016   Vitamin D deficiency 05/16/2016   Obesity (BMI 30-39.9) 05/13/2016   Diabetes mellitus (HCC) 05/13/2016   COPD (chronic obstructive pulmonary disease) (HCC) 05/13/2016   Chest pain syndrome    Hypertension associated with diabetes (HCC)    Stopped smoking with greater than 25 pack year history     Allergies:  Allergies  Allergen Reactions   Dilaudid [Hydromorphone]     Itching and vomitting   Doxycycline Itching   Valproic Acid And Related Nausea And Vomiting   Medications:  Current Outpatient Medications:    meclizine (ANTIVERT) 25 MG tablet, Take 1 tablet (25 mg total) by mouth 3 (three) times daily as needed for dizziness., Disp: 30 tablet, Rfl: 0   albuterol (VENTOLIN HFA) 108 (90 Base) MCG/ACT inhaler, Inhale 2 puffs into the lungs every 6 (six) hours as needed for wheezing or shortness of breath., Disp: 8 g, Rfl: 2   aspirin EC 81 MG tablet, Take one po qd, Disp: 90 tablet, Rfl: 1   budesonide-formoterol (SYMBICORT) 80-4.5 MCG/ACT inhaler, Take 2 puffs first thing in am and then another 2 puffs about 12 hours later., Disp: 1 each, Rfl: 12   cyclobenzaprine (FLEXERIL) 10 MG tablet, Take 1 tablet by mouth three times daily as needed for muscle spasm, Disp: 90 tablet, Rfl: 0   desvenlafaxine (PRISTIQ) 100 MG 24 hr tablet, Take 1 tablet (100 mg total) by mouth daily., Disp: 90 tablet, Rfl: 1   escitalopram (LEXAPRO) 20 MG tablet, Take 1 tablet by mouth once daily, Disp: 30 tablet, Rfl: 5   esomeprazole (NEXIUM) 40 MG capsule, TAKE 1 CAPSULE BY MOUTH ONCE DAILY BEFORE BREAKFAST, Disp: 30 capsule, Rfl: 3   famotidine (PEPCID) 20 MG tablet, One after supper, Disp: 30  tablet, Rfl: 11   gabapentin (NEURONTIN) 100 MG capsule, Take 1 capsule (100 mg total) by mouth 3 (three) times daily., Disp: 90 capsule, Rfl: 2   gabapentin (NEURONTIN) 300 MG capsule, Take 1 capsule (300 mg total) by mouth 3 (three) times daily., Disp: 90 capsule, Rfl: 2   glimepiride (AMARYL) 2 MG tablet, Take 1 tablet (2 mg total) by mouth daily with breakfast., Disp: 90 tablet, Rfl: 1   ibuprofen (ADVIL) 800 MG tablet, Take 1 tablet by mouth twice daily, Disp: 180 tablet, Rfl: 0   levocetirizine (XYZAL) 5 MG tablet, Take 1 tablet (5 mg total) by mouth at bedtime as needed (allergic reaction/ itching)., Disp: 30 tablet, Rfl: 0   levothyroxine (SYNTHROID) 125 MCG tablet, Take 1 tablet (125 mcg total) by mouth daily before breakfast., Disp: 95 tablet, Rfl: 3   losartan (COZAAR) 100 MG tablet, Take 1 tablet by mouth once daily, Disp: 90 tablet, Rfl: 1   metFORMIN (GLUCOPHAGE) 1000 MG tablet, Take 1 tablet (1,000 mg total) by mouth 2 (two) times daily with a meal. (NEEDS TO BE SEEN BEFORE NEXT REFILL), Disp: 180 tablet,  Rfl: 1   rosuvastatin (CRESTOR) 10 MG tablet, Take 1 tablet (10 mg total) by mouth daily., Disp: 90 tablet, Rfl: 3   TALTZ 80 MG/ML SOSY, Inject into the skin every 30 (thirty) days., Disp: , Rfl:    TRULICITY 3 MG/0.5ML SOAJ, INJECT 3 MG SUBCUTANEOUSLY ONCE A WEEK, Disp: 4 mL, Rfl: 2   vitamin B-12 (CYANOCOBALAMIN) 1000 MCG tablet, Take 1,000 mcg by mouth in the morning., Disp: , Rfl:    Vitamin D, Ergocalciferol, (DRISDOL) 1.25 MG (50000 UNIT) CAPS capsule, Take 1 capsule (50,000 Units total) by mouth every 7 (seven) days., Disp: 12 capsule, Rfl: 3   zinc gluconate 50 MG tablet, Take 50 mg by mouth in the morning., Disp: , Rfl:   Observations/Objective: Patient is well-developed, well-nourished in no acute distress.  Resting comfortably   Head is normocephalic, atraumatic.  No labored breathing.  Speech is clear and coherent with logical content.  Patient is alert and oriented  at baseline.    Assessment and Plan: 1. Dizziness (Primary) - meclizine (ANTIVERT) 25 MG tablet; Take 1 tablet (25 mg total) by mouth 3 (three) times daily as needed for dizziness.  Dispense: 30 tablet; Refill: 0  2. Other fatigue  3. Hypertension associated with diabetes (HCC)  BP in office looked great Will bring BP machine from home to our next visit Force fluids Avoid fast position changes Labs reviewed from the last 2-3 months, all stable Keep chronic follow up  Follow Up Instructions: I discussed the assessment and treatment plan with the patient. The patient was provided an opportunity to ask questions and all were answered. The patient agreed with the plan and demonstrated an understanding of the instructions.  A copy of instructions were sent to the patient via MyChart unless otherwise noted below.     The patient was advised to call back or seek an in-person evaluation if the symptoms worsen or if the condition fails to improve as anticipated.    Jannifer Rodney, [name]

## 2023-04-22 ENCOUNTER — Encounter: Payer: Self-pay | Admitting: Internal Medicine

## 2023-04-22 DIAGNOSIS — R058 Other specified cough: Secondary | ICD-10-CM | POA: Insufficient documentation

## 2023-04-22 NOTE — Addendum Note (Signed)
Addended by: Sandrea Hughs B on: 04/22/2023 04:33 PM   Modules accepted: Orders

## 2023-04-22 NOTE — Assessment & Plan Note (Addendum)
Active smoker   Chest CT2/22/24   w/o contrast Persistent ground-glass attenuation nodule in the right upper lobe measuring 1.3 x 1.0 cm, grossly stable. Repeat noncontrast chest CT is recommended every 2 years until 5 years of stability has been established  Advised re [facility] sociecty guidelines   Also: Low-dose CT lung cancer screening is recommended for patients who are 42-54 years of age with a 20+ pack-year history of smoking and who are currently smoking or quit <=15 years ago. No coughing up blood  No unintentional weight loss of > 15 pounds in the last 6 months - pt is eligible for scanning yearly until 15 y p stopping   >>> due for LDSCT  06/2023 / advised / referred to [facility] program.

## 2023-04-22 NOTE — Assessment & Plan Note (Addendum)
Counseled re importance of smoking cessation but did not meet time criteria for separate billing     Each maintenance medication was reviewed in detail including emphasizing most importantly the difference between maintenance and prns and under what circumstances the prns are to be triggered using an action plan format where appropriate.  Total time for H and P, chart review, counseling, reviewing hfa device(s) , directly observing portions of ambulatory 02 saturation study/ and generating customized AVS unique to this office visit / same day charting  > 45 min new pt eval              

## 2023-04-22 NOTE — Assessment & Plan Note (Addendum)
Onset 10/2022 p d/c'd gerd rx 10/2022  - 02/24/23  EOS  0.1  - 04/21/2023 resume max gerd rx and titrate up gabapentin if tol to max of 300 mg qid   Upper airway cough syndrome (previously labeled PNDS),  is so named because it's frequently impossible to sort out how much is  CR/sinusitis with freq throat clearing (which can be related to primary GERD)   vs  causing  secondary (" extra esophageal")  GERD from wide swings in gastric pressure that occur with throat clearing, often  promoting self use of mint and menthol lozenges that reduce the lower esophageal sphincter tone and exacerbate the problem further in a cyclical fashion.   These are the same pts (now being labeled as having "irritable larynx syndrome" by some cough centers) who not infrequently have a history of having failed to tolerate ace inhibitors,  dry powder inhalers (or even high dose ICS like Breztri) or biphosphonates or report having atypical/extraesophageal reflux symptoms(eg LPR/ globus)  that don't respond to standard doses of PPI  and are easily confused as having aecopd or asthma flares by even experienced allergists/ pulmonologists (myself included).  Of the three most common causes of  Sub-acute / recurrent or chronic cough, only one (GERD)  can actually contribute to/ trigger  the other two (asthma and post nasal drip syndrome)  and perpetuate the cylce of cough.  While not intuitively obvious, many patients with chronic low grade reflux do not cough until there is a primary insult that disturbs the protective epithelial barrier and exposes sensitive nerve endings.   This is typically viral but can due to PNDS and  either may apply here.    >>>  The point is that once this occurs, it is difficult to eliminate the cycle  using anything but a maximally effective acid suppression regimen at least in the short run, accompanied by an appropriate diet to address non acid GERD and control / eliminate the cough itself with gabapentin    F/u in 6 weeks, sooner if needed

## 2023-04-22 NOTE — Assessment & Plan Note (Signed)
Quit smoking  09/2016 @ wt  200  -  PFTs  11/26/22   no obstruction/  ERV 198 @ 37 lb  - 04/21/2023  @ 207 lbs  Walked on RA  x  3  lap(s) =  approx 450  ft  @ mod pace, stopped due to end of study s sob/cp with lowest 02 sats 96%   - 04/21/2023  @ 207 lb  After extensive coaching inhaler device,  effectiveness =    75% change from breztri to symbicort 80 2bid   Could not reproduce doe today and main finding x 5 m new dry cough c/w UACS (see sep a/p ) so since she does not have copd but may have mild AB rec:  Change breztri to symbicort 80 2bid/ work on [employer] technique Continue regular sub max ex  Ok to use hfa pre ex if feels needs it after - see avs for instructions unique to this ov

## 2023-05-01 ENCOUNTER — Other Ambulatory Visit: Payer: Medicaid Other

## 2023-05-08 ENCOUNTER — Encounter: Payer: Self-pay | Admitting: Family

## 2023-05-08 ENCOUNTER — Telehealth: Payer: Medicaid Other | Admitting: Family

## 2023-05-08 DIAGNOSIS — E669 Obesity, unspecified: Secondary | ICD-10-CM

## 2023-05-08 DIAGNOSIS — M5440 Lumbago with sciatica, unspecified side: Secondary | ICD-10-CM | POA: Diagnosis not present

## 2023-05-08 DIAGNOSIS — J441 Chronic obstructive pulmonary disease with (acute) exacerbation: Secondary | ICD-10-CM | POA: Diagnosis not present

## 2023-05-08 DIAGNOSIS — E785 Hyperlipidemia, unspecified: Secondary | ICD-10-CM

## 2023-05-08 DIAGNOSIS — G8929 Other chronic pain: Secondary | ICD-10-CM

## 2023-05-08 DIAGNOSIS — B9689 Other specified bacterial agents as the cause of diseases classified elsewhere: Secondary | ICD-10-CM

## 2023-05-08 DIAGNOSIS — E1169 Type 2 diabetes mellitus with other specified complication: Secondary | ICD-10-CM

## 2023-05-08 DIAGNOSIS — E890 Postprocedural hypothyroidism: Secondary | ICD-10-CM

## 2023-05-08 DIAGNOSIS — K219 Gastro-esophageal reflux disease without esophagitis: Secondary | ICD-10-CM

## 2023-05-08 DIAGNOSIS — J208 Acute bronchitis due to other specified organisms: Secondary | ICD-10-CM

## 2023-05-08 DIAGNOSIS — Z6834 Body mass index (BMI) 34.0-34.9, adult: Secondary | ICD-10-CM | POA: Diagnosis not present

## 2023-05-08 DIAGNOSIS — I152 Hypertension secondary to endocrine disorders: Secondary | ICD-10-CM

## 2023-05-08 DIAGNOSIS — E1159 Type 2 diabetes mellitus with other circulatory complications: Secondary | ICD-10-CM

## 2023-05-08 DIAGNOSIS — F411 Generalized anxiety disorder: Secondary | ICD-10-CM

## 2023-05-08 MED ORDER — AMOXICILLIN-POT CLAVULANATE 875-125 MG PO TABS: 1.0000 | ORAL_TABLET | Freq: Two times a day (BID) | ORAL | 0 refills | Status: DC

## 2023-05-08 MED ORDER — CYCLOBENZAPRINE HCL 10 MG PO TABS: 10.0000 mg | ORAL_TABLET | Freq: Three times a day (TID) | ORAL | 0 refills | Status: DC | PRN

## 2023-05-08 MED ORDER — AZITHROMYCIN 250 MG PO TABS: ORAL_TABLET | ORAL | 0 refills | Status: DC

## 2023-05-08 NOTE — Progress Notes (Signed)
 Virtual Visit Consent   Sherry Glass, you are scheduled for a virtual visit with a [facility] provider today. Just as with appointments in the office, your consent must be obtained to participate. Your consent will be active for this visit and any virtual visit you may have with one of our providers in the next 365 days. If you have a MyChart account, a copy of this consent can be sent to you electronically.  As this is a virtual visit, video technology does not allow for your provider to perform a traditional examination. This may limit your provider's ability to fully assess your condition. If your provider identifies any concerns that need to be evaluated in person or the need to arrange testing (such as labs, EKG, etc.), we will make arrangements to do so. Although advances in technology are sophisticated, we cannot ensure that it will always work on either your end or our end. If the connection with a video visit is poor, the visit may have to be switched to a telephone visit. With either a video or telephone visit, we are not always able to ensure that we have a secure connection.  By engaging in this virtual visit, you consent to the provision of healthcare and authorize for your insurance to be billed (if applicable) for the services provided during this visit. Depending on your insurance coverage, you may receive a charge related to this service.  I need to obtain your verbal consent now. Are you willing to proceed with your visit today? ADDALINE PEPLINSKI has provided verbal consent on 05/08/2023 for a virtual visit (video or telephone). Bari Learn, [name]  Date: 05/08/2023 3:40 PM  Virtual Visit via Video Note   I, Bari Learn, connected with  Sherry WAFER  ([id number], July 04, 1970) on 05/08/23 at  2:40 PM EST by a video-enabled telemedicine application and verified that I am speaking with the correct person using two identifiers.  Location: Patient: Virtual Visit Location Patient:  Home Provider: Virtual Visit Location Provider: Home Office   I discussed the limitations of evaluation and management by telemedicine and the availability of in person appointments. The patient expressed understanding and agreed to proceed.    History of Present Illness: Sherry Glass is a 53 y.o. who identifies as a female who was assigned female at birth, and is being seen today for chronic follow up.    Pt has seen Pulmonologist for COPD, Restrictive lung disease, and SOB, but has been cleared.  She had thyroid  ablation.   She is followed by [facility] as needed for chronic back pain. Had a MRI 05/22/22.    She is obese with a BMI of 34 and DM and HTN.  Two weeks ago had the flu, was feeling better but the last few days started feeling worse. Has a nonproductive cough.  HPI: Hypertension This is a chronic problem. The current episode started more than 1 year ago. The problem has been resolved since onset. The problem is controlled. Associated symptoms include anxiety, blurred vision, malaise/fatigue and shortness of breath. Pertinent negatives include no peripheral edema. Risk factors for coronary artery disease include diabetes mellitus, dyslipidemia, obesity and sedentary lifestyle. The current treatment provides moderate improvement. Identifiable causes of hypertension include a thyroid  problem.  Gastroesophageal Reflux She complains of belching, heartburn and a hoarse voice. The current episode started more than 1 year ago. The problem occurs occasionally. Associated symptoms include fatigue. Risk factors include obesity. She has tried a PPI for the symptoms.  The treatment provided moderate relief.  Thyroid  Problem Presents for follow-up visit. Symptoms include anxiety, fatigue and hoarse voice. Patient reports no constipation or diarrhea. The symptoms have been stable. Her past medical history is significant for hyperlipidemia.  Hyperlipidemia This is a chronic problem. The current  episode started more than 1 year ago. The problem is controlled. Recent lipid tests were reviewed and are normal. Exacerbating diseases include obesity. Associated symptoms include shortness of breath. Current antihyperlipidemic treatment includes statins. The current treatment provides moderate improvement of lipids. Risk factors for coronary artery disease include diabetes mellitus, dyslipidemia, hypertension, a sedentary lifestyle and post-menopausal.  Diabetes She presents for her follow-up diabetic visit. She has type 2 diabetes mellitus. Hypoglycemia symptoms include nervousness/anxiousness. Associated symptoms include blurred vision and fatigue. Pertinent negatives for diabetes include no foot paresthesias. Symptoms are stable. Risk factors for coronary artery disease include dyslipidemia, diabetes mellitus, hypertension, sedentary lifestyle and post-menopausal. She is following a generally healthy diet. Her overall blood glucose range is 90-110 mg/dl. Eye exam is not current.  Back Pain This is a chronic problem. The current episode started more than 1 year ago. The problem occurs intermittently. The problem has been waxing and waning since onset. The pain is present in the lumbar spine. The quality of the pain is described as aching. The pain is at a severity of 7/10. The pain is moderate. Risk factors include obesity. She has tried bed rest for the symptoms. The treatment provided moderate relief.  Anxiety Presents for follow-up visit. Symptoms include excessive worry, nervous/anxious behavior, restlessness and shortness of breath.      Problems:  Patient Active Problem List   Diagnosis Date Noted   Upper airway cough syndrome 04/22/2023   DOE (dyspnea on exertion) 04/21/2023   Arthropathy of lumbar facet joint 04/20/2023   Chronic bilateral low back pain with sciatica 09/22/2022   Diarrhea 09/09/2022   Adrenal mass (HCC) 11/11/2021   Adhesive capsulitis of left shoulder    Spinal  stenosis of lumbar region 07/24/2020   Lumbar radiculitis 11/21/2019   Lumbar spondylosis 11/21/2019   Lumbar stenosis with neurogenic claudication 11/21/2019   Hepatic steatosis 05/23/2019   Psoriasis 05/09/2019   Depression, major, single episode, mild (HCC) 10/12/2018   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   Nocturnal hypoxemia 05/29/2017   GAD (generalized anxiety disorder) 02/13/2017   Restrictive lung disease 01/27/2017   Lung nodule 01/27/2017   GERD (gastroesophageal reflux disease) 11/18/2016   Snoring 11/18/2016   Witnessed episode of apnea 11/18/2016   Chronic seasonal allergic rhinitis 11/18/2016   Restless leg syndrome 11/18/2016   Fibromyalgia 11/18/2016   Hyperlipidemia associated with type 2 diabetes mellitus (HCC) 06/09/2016   Vitamin D  deficiency 05/16/2016   Obesity (BMI 30-39.9) 05/13/2016   Diabetes mellitus (HCC) 05/13/2016   COPD (chronic obstructive pulmonary disease) (HCC) 05/13/2016   Cigarette smoker 05/13/2016   Chest pain syndrome    Hypertension associated with diabetes (HCC)    Stopped smoking with greater than 25 pack year history     Allergies:  Allergies  Allergen Reactions   Dilaudid [Hydromorphone]     Itching and vomitting   Doxycycline  Itching   Valproic Acid And Related Nausea And Vomiting   Medications:  Current Outpatient Medications:    amoxicillin -clavulanate (AUGMENTIN ) 875-125 MG tablet, Take 1 tablet by mouth 2 (two) times daily., Disp: 14 tablet, Rfl: 0   azithromycin  (ZITHROMAX ) 250 MG tablet, Take 500 mg once, then 250 mg for four days, Disp: 6 tablet, Rfl: 0  albuterol  (VENTOLIN  HFA) 108 (90 Base) MCG/ACT inhaler, Inhale 2 puffs into the lungs every 6 (six) hours as needed for wheezing or shortness of breath., Disp: 8 g, Rfl: 2   aspirin  EC 81 MG tablet, Take one po qd, Disp: 90 tablet, Rfl: 1   budesonide -formoterol  (SYMBICORT ) 80-4.5 MCG/ACT inhaler, Take 2 puffs first thing in am and then another 2  puffs about 12 hours later., Disp: 1 each, Rfl: 12   cyclobenzaprine  (FLEXERIL ) 10 MG tablet, Take 1 tablet (10 mg total) by mouth 3 (three) times daily as needed. for muscle spams, Disp: 90 tablet, Rfl: 0   desvenlafaxine  (PRISTIQ ) 100 MG 24 hr tablet, Take 1 tablet (100 mg total) by mouth daily., Disp: 90 tablet, Rfl: 1   esomeprazole  (NEXIUM ) 40 MG capsule, TAKE 1 CAPSULE BY MOUTH ONCE DAILY BEFORE BREAKFAST, Disp: 30 capsule, Rfl: 3   famotidine  (PEPCID ) 20 MG tablet, One after supper, Disp: 30 tablet, Rfl: 11   gabapentin  (NEURONTIN ) 100 MG capsule, Take 1 capsule (100 mg total) by mouth 3 (three) times daily., Disp: 90 capsule, Rfl: 2   gabapentin  (NEURONTIN ) 300 MG capsule, Take 1 capsule (300 mg total) by mouth 3 (three) times daily., Disp: 90 capsule, Rfl: 2   glimepiride  (AMARYL ) 2 MG tablet, Take 1 tablet (2 mg total) by mouth daily with breakfast., Disp: 90 tablet, Rfl: 1   ibuprofen  (ADVIL ) 800 MG tablet, Take 1 tablet by mouth twice daily, Disp: 180 tablet, Rfl: 0   levothyroxine  (SYNTHROID ) 125 MCG tablet, Take 1 tablet (125 mcg total) by mouth daily before breakfast., Disp: 95 tablet, Rfl: 3   losartan  (COZAAR ) 100 MG tablet, Take 1 tablet by mouth once daily, Disp: 90 tablet, Rfl: 1   meclizine  (ANTIVERT ) 25 MG tablet, Take 1 tablet (25 mg total) by mouth 3 (three) times daily as needed for dizziness., Disp: 30 tablet, Rfl: 0   metFORMIN  (GLUCOPHAGE ) 1000 MG tablet, Take 1 tablet (1,000 mg total) by mouth 2 (two) times daily with a meal. (NEEDS TO BE SEEN BEFORE NEXT REFILL), Disp: 180 tablet, Rfl: 1   rosuvastatin  (CRESTOR ) 10 MG tablet, Take 1 tablet (10 mg total) by mouth daily., Disp: 90 tablet, Rfl: 3   TALTZ 80 MG/ML SOSY, Inject into the skin every 30 (thirty) days., Disp: , Rfl:    TRULICITY  3 MG/0.5ML SOAJ, INJECT 3 MG SUBCUTANEOUSLY ONCE A WEEK, Disp: 4 mL, Rfl: 2   Vitamin D , Ergocalciferol , (DRISDOL ) 1.25 MG (50000 UNIT) CAPS capsule, Take 1 capsule (50,000 Units total)  by mouth every 7 (seven) days., Disp: 12 capsule, Rfl: 3  Observations/Objective: Patient is well-developed, well-nourished in no acute distress.  Resting comfortably  at home.  Glass is normocephalic, atraumatic.  No labored breathing.  Speech is clear and coherent with logical content.  Patient is alert and oriented at baseline.   Assessment and Plan: 1. Hypertension associated with diabetes (HCC) (Primary)  2. Obesity (BMI 30-39.9)  3. Type 2 diabetes mellitus with other specified complication, without long-term current use of insulin  (HCC)  4. Chronic obstructive pulmonary disease with acute exacerbation (HCC)  5. Hyperlipidemia associated with type 2 diabetes mellitus (HCC)  6. Gastroesophageal reflux disease, unspecified whether esophagitis present  7. Hypothyroidism following radioiodine therapy  8. GAD (generalized anxiety disorder)  9. Acute bacterial bronchitis - azithromycin  (ZITHROMAX ) 250 MG tablet; Take 500 mg once, then 250 mg for four days  Dispense: 6 tablet; Refill: 0 - amoxicillin -clavulanate (AUGMENTIN ) 875-125 MG tablet; Take 1 tablet by mouth  2 (two) times daily.  Dispense: 14 tablet; Refill: 0  10. Chronic bilateral low back pain with sciatica, sciatica laterality unspecified - cyclobenzaprine  (FLEXERIL ) 10 MG tablet; Take 1 tablet (10 mg total) by mouth 3 (three) times daily as needed. for muscle spams  Dispense: 90 tablet; Refill: 0  Continue current medications  Keep follow up with specialists  Encouraged healthy diet and exercise  Follow up in 3 months   Follow Up Instructions: I discussed the assessment and treatment plan with the patient. The patient was provided an opportunity to ask questions and all were answered. The patient agreed with the plan and demonstrated an understanding of the instructions.  A copy of instructions were sent to the patient via MyChart unless otherwise noted below.     The patient was advised to call back or seek an  in-person evaluation if the symptoms worsen or if the condition fails to improve as anticipated.    Bari Learn, [name]

## 2023-05-17 ENCOUNTER — Institutional Professional Consult (permissible substitution): Payer: Medicaid Other | Admitting: Pulmonary Disease

## 2023-05-24 ENCOUNTER — Ambulatory Visit
Admission: RE | Admit: 2023-05-24 | Discharge: 2023-05-24 | Disposition: A | Discharge status: Home / Self Care | Payer: Medicaid Other | Source: Ambulatory Visit | Attending: Surgery | Admitting: Surgery

## 2023-05-24 DIAGNOSIS — M6281 Muscle weakness (generalized): Secondary | ICD-10-CM

## 2023-05-24 DIAGNOSIS — M5124 Other intervertebral disc displacement, thoracic region: Secondary | ICD-10-CM | POA: Diagnosis not present

## 2023-05-29 ENCOUNTER — Ambulatory Visit (INDEPENDENT_AMBULATORY_CARE_PROVIDER_SITE_OTHER): Payer: Medicaid Other | Admitting: Family

## 2023-05-29 ENCOUNTER — Encounter: Payer: Self-pay | Admitting: Family

## 2023-05-29 ENCOUNTER — Other Ambulatory Visit: Payer: Medicaid Other

## 2023-05-29 VITALS — BP 127/73 | HR 78 | Temp 97.8°F | Ht 64.0 in

## 2023-05-29 DIAGNOSIS — B9689 Other specified bacterial agents as the cause of diseases classified elsewhere: Secondary | ICD-10-CM | POA: Diagnosis not present

## 2023-05-29 DIAGNOSIS — N760 Acute vaginitis: Secondary | ICD-10-CM | POA: Diagnosis not present

## 2023-05-29 DIAGNOSIS — N898 Other specified noninflammatory disorders of vagina: Secondary | ICD-10-CM

## 2023-05-29 LAB — WET PREP FOR TRICH, YEAST, CLUE
Clue Cell Exam: POSITIVE — AB
Trichomonas Exam: NEGATIVE
Yeast Exam: NEGATIVE

## 2023-05-29 MED ORDER — METRONIDAZOLE 500 MG PO TABS: 500.0000 mg | ORAL_TABLET | Freq: Two times a day (BID) | ORAL | 0 refills | Status: DC

## 2023-05-29 NOTE — Patient Instructions (Addendum)
StartVaginal Infection (Bacterial Vaginosis): What to Know  Bacterial vaginosis is an infection of the vagina. It happens when the balance of normal germs (bacteria) in the vagina changes. It's common among females ages 19 to 44. If left untreated, it can increase your risk of getting a sexually transmitted infection (STI). If you're pregnant, you need to get treated right away. This infection can cause a baby to be born early or at a low birth weight. What are the causes? This happens when too many harmful germs grow in the vagina. The exact reason why this happens isn't known. You can't get this infection from toilet seats, bedding, swimming pools, or contact with objects around you. What increases the risk? Having new or multiple sexual partners, or unprotected sex. Douching. Using an intrauterine device (IUD). Smoking. Alcohol and drug abuse. Taking certain antibiotics. Being pregnant. You can get a vaginal infection without being sexually active. However, it most often occurs in sexually active females. What are the signs or symptoms? Some females have no symptoms. If you have symptoms, they may include: Wallace Cullens or white vaginal discharge. It can be watery or foamy. A fish-like smell, especially after sex or during your menstrual period. Itching in and around the vagina. Burning or pain with peeing. How is this diagnosed? This infection is diagnosed based on: Your medical history. A physical exam of the vagina. Checking a sample of vaginal fluid for harmful bacteria or uncommon cells. How is this treated? This condition is treated with antibiotics. These may be given as: A pill. A cream for your vagina. A medicine that you put into your vagina called a suppository. If the infection comes back, you may need more antibiotics. Follow these instructions at home: Medicines Take your medicines only as told. Take or apply your antibiotics as told. Do not stop using them even if you  start to feel better. General instructions If you have a female sexual partner, tell her about the infection. She should see her health care provider. Female partners don't need treatment. Avoid sex until treatment is complete. Drink more fluids as told. Keep the area around your vagina and rectum clean. Wash the area daily with warm water. Wipe yourself from front to back after pooping. If you're breastfeeding, talk to your provider about continuing during treatment. How is this prevented? Self-care Do not douche or use vaginal deodorant sprays. Douching can upset the balance of good and harmful bacteria in the vagina, which can cause an infection to happen again. Wear cotton or cotton-lined underwear. Avoid wearing tight pants or pantyhose, especially in the summer. Safe sex Use condoms correctly and every time you have sex. Use dental dams to protect yourself during oral sex. Limit the number of sexual partners. Get tested for STIs. Your sexual partner should also get tested. Drugs and alcohol Do not smoke, vape, or use nicotine or tobacco. Do not use drugs. Limit the amount of alcohol you drink because it can lead to risky sexual behavior. Where to find more information To learn more: Go to TonerPromos.no. Click Health Topics A-Z. Type "bacterial vaginosis" in the search box. American Sexual Health Association (ASHA): ashasexualhealth.org U.S. Department of Health and Health and safety inspector, Office on Women's Health: TravelLesson.ca Contact a health care provider if: Your symptoms don't get better, even after treatment. You have more discharge or pain when peeing. You have a fever or chills. You have pain in your belly or pelvis. You have pain during sex. You have vaginal bleeding between menstrual periods.  This information is not intended to replace advice given to you by your health care provider. Make sure you discuss any questions you have with your health care provider. Document  Revised: 10/05/2022 Document Reviewed: 10/05/2022 Elsevier Patient Education  2024 [employer].

## 2023-05-29 NOTE — Progress Notes (Signed)
   Subjective:    Patient ID: Sherry Glass, female    DOB: Jan 04, 1971, 53 y.o.   MRN: [id number]  Chief Complaint  Patient presents with   Vaginal Itching    And irritation    Pt presents to the office today with vaginal itching for months. Vaginal Itching The patient's primary symptoms include genital itching. The patient's pertinent negatives include no genital odor, vaginal bleeding or vaginal discharge. The patient is experiencing no pain.      Review of Systems  Genitourinary:  Negative for vaginal discharge.  All other systems reviewed and are negative.      Objective:   Physical Exam Vitals reviewed.  Constitutional:      General: She is not in acute distress.    Appearance: She is well-developed. She is obese.  HENT:     Head: Normocephalic and atraumatic.     Right Ear: Tympanic membrane normal.     Left Ear: Tympanic membrane normal.  Eyes:     Pupils: Pupils are equal, round, and reactive to light.  Neck:     Thyroid: No thyromegaly.  Cardiovascular:     Rate and Rhythm: Normal rate and regular rhythm.     Heart sounds: Normal heart sounds. No murmur heard. Pulmonary:     Effort: Pulmonary effort is normal. No respiratory distress.     Breath sounds: Normal breath sounds. No wheezing.  Abdominal:     General: Bowel sounds are normal. There is no distension.     Palpations: Abdomen is soft.     Tenderness: There is no abdominal tenderness.  Musculoskeletal:        General: No tenderness. Normal range of motion.     Cervical back: Normal range of motion and neck supple.  Skin:    General: Skin is warm and dry.  Neurological:     Mental Status: She is alert and oriented to person, place, and time.     Cranial Nerves: No cranial nerve deficit.     Deep Tendon Reflexes: Reflexes are normal and symmetric.  Psychiatric:        Behavior: Behavior normal.        Thought Content: Thought content normal.        Judgment: Judgment normal.     BP 127/73    Pulse 78   Temp 97.8 F (36.6 C) (Temporal)   Ht 5\' 4"  (1.626 m)   BMI 35.53 kg/m      Assessment & Plan:  Sherry Glass comes in today with chief complaint of Vaginal Itching (And irritation )   Diagnosis and orders addressed:  1. Vaginal itching (Primary) - WET PREP FOR TRICH, YEAST, CLUE  2. Bacterial vaginitis Start flagyl  Keep clean and dry Probiotic  Follow up if symptoms worsen or do not improve  - metroNIDAZOLE (FLAGYL) 500 MG tablet; Take 1 tablet (500 mg total) by mouth 2 (two) times daily.  Dispense: 14 tablet; Refill: 0   Jannifer Rodney, [name]

## 2023-05-30 ENCOUNTER — Other Ambulatory Visit: Payer: Self-pay | Admitting: Family

## 2023-05-30 ENCOUNTER — Other Ambulatory Visit: Payer: Self-pay | Admitting: Gastroenterology

## 2023-05-30 DIAGNOSIS — M25552 Pain in left hip: Secondary | ICD-10-CM

## 2023-05-30 DIAGNOSIS — R197 Diarrhea, unspecified: Secondary | ICD-10-CM

## 2023-05-30 DIAGNOSIS — K219 Gastro-esophageal reflux disease without esophagitis: Secondary | ICD-10-CM

## 2023-05-30 DIAGNOSIS — E1159 Type 2 diabetes mellitus with other circulatory complications: Secondary | ICD-10-CM

## 2023-05-30 DIAGNOSIS — E1169 Type 2 diabetes mellitus with other specified complication: Secondary | ICD-10-CM

## 2023-05-30 DIAGNOSIS — E1165 Type 2 diabetes mellitus with hyperglycemia: Secondary | ICD-10-CM

## 2023-05-30 DIAGNOSIS — G8929 Other chronic pain: Secondary | ICD-10-CM

## 2023-05-30 DIAGNOSIS — R112 Nausea with vomiting, unspecified: Secondary | ICD-10-CM

## 2023-05-30 MED ORDER — IBUPROFEN 800 MG PO TABS: 800.0000 mg | ORAL_TABLET | Freq: Two times a day (BID) | ORAL | 0 refills | Status: DC

## 2023-05-30 MED ORDER — METFORMIN HCL 1000 MG PO TABS: 1000.0000 mg | ORAL_TABLET | Freq: Two times a day (BID) | ORAL | 1 refills | Status: DC

## 2023-05-30 MED ORDER — CYCLOBENZAPRINE HCL 10 MG PO TABS: 10.0000 mg | ORAL_TABLET | Freq: Three times a day (TID) | ORAL | 0 refills | Status: DC | PRN

## 2023-05-30 MED ORDER — LOSARTAN POTASSIUM 100 MG PO TABS
100.0000 mg | ORAL_TABLET | Freq: Every day | ORAL | 1 refills | Status: DC
Start: 2023-05-30 — End: 2023-08-22

## 2023-05-30 MED ORDER — GLIMEPIRIDE 2 MG PO TABS: 2.0000 mg | ORAL_TABLET | Freq: Every day | ORAL | 1 refills | Status: DC

## 2023-05-30 NOTE — Telephone Encounter (Signed)
Copied from CRM [id number]. Topic: Clinical - Medication Refill >> May 30, 2023  2:35 PM Eunice Blase wrote: Most Recent Primary Care Visit:  Provider: Jannifer Rodney A  Department: [facility]  Visit Type: MYCHART VIDEO VISIT  Date: 05/08/2023  Medication: cyclobenzaprine (FLEXERIL) 10 MG tablet, glimepiride (AMARYL) 2 MG tablet,  metFORMIN (GLUCOPHAGE) 1000 MG tablet, ibuprofen (ADVIL) 800 MG tablet, losartan (COZAAR) 100 MG tablet Has the patient contacted their pharmacy? Yes (Agent: If no, request that the patient contact the pharmacy for the refill. If patient does not wish to contact the pharmacy document the reason why and proceed with request.) (Agent: If yes, when and what did the pharmacy advise?)  Is this the correct pharmacy for this prescription? Yes If no, delete pharmacy and type the correct one.  This is the patient's preferred pharmacy:  [facility] [address] [place] - [address] [place] [address] [place] [address] Phone: [phone] Fax: [phone]   Has the prescription been filled recently? Yes  Is the patient out of the medication? Yes  Has the patient been seen for an appointment in the last year OR does the patient have an upcoming appointment? Yes  Can we respond through MyChart? Yes  Agent: Please be advised that Rx refills may take up to 3 business days. We ask that you follow-up with your pharmacy.

## 2023-06-07 DIAGNOSIS — R208 Other disturbances of skin sensation: Secondary | ICD-10-CM | POA: Diagnosis not present

## 2023-06-08 ENCOUNTER — Telehealth: Payer: Medicaid Other | Admitting: Family

## 2023-06-08 ENCOUNTER — Encounter: Payer: Self-pay | Admitting: Family

## 2023-06-08 ENCOUNTER — Telehealth: Payer: Self-pay

## 2023-06-08 DIAGNOSIS — N898 Other specified noninflammatory disorders of vagina: Secondary | ICD-10-CM

## 2023-06-08 DIAGNOSIS — M5441 Lumbago with sciatica, right side: Secondary | ICD-10-CM | POA: Diagnosis not present

## 2023-06-08 DIAGNOSIS — R200 Anesthesia of skin: Secondary | ICD-10-CM

## 2023-06-08 DIAGNOSIS — G8929 Other chronic pain: Secondary | ICD-10-CM | POA: Diagnosis not present

## 2023-06-08 DIAGNOSIS — B3731 Acute candidiasis of vulva and vagina: Secondary | ICD-10-CM | POA: Diagnosis not present

## 2023-06-08 DIAGNOSIS — M5442 Lumbago with sciatica, left side: Secondary | ICD-10-CM

## 2023-06-08 MED ORDER — FLUCONAZOLE 150 MG PO TABS: 150.0000 mg | ORAL_TABLET | ORAL | 0 refills | Status: DC | PRN

## 2023-06-08 NOTE — Progress Notes (Signed)
 Virtual Visit Consent   Sherry Glass, you are scheduled for a virtual visit with a [facility] provider today. Just as with appointments in the office, your consent must be obtained to participate. Your consent will be active for this visit and any virtual visit you may have with one of our providers in the next 365 days. If you have a MyChart account, a copy of this consent can be sent to you electronically.  As this is a virtual visit, video technology does not allow for your provider to perform a traditional examination. This may limit your provider's ability to fully assess your condition. If your provider identifies any concerns that need to be evaluated in person or the need to arrange testing (such as labs, EKG, etc.), we will make arrangements to do so. Although advances in technology are sophisticated, we cannot ensure that it will always work on either your end or our end. If the connection with a video visit is poor, the visit may have to be switched to a telephone visit. With either a video or telephone visit, we are not always able to ensure that we have a secure connection.  By engaging in this virtual visit, you consent to the provision of healthcare and authorize for your insurance to be billed (if applicable) for the services provided during this visit. Depending on your insurance coverage, you may receive a charge related to this service.  I need to obtain your verbal consent now. Are you willing to proceed with your visit today? Sherry Glass has provided verbal consent on 06/08/2023 for a virtual visit (video or telephone). Bari Learn, [name]  Date: 06/08/2023 12:53 PM  Virtual Visit via Video Note   I, Bari Learn, connected with  Sherry Glass  ([id number], 1971-02-26) on 06/08/23 at  6:00 PM EST by a video-enabled telemedicine application and verified that I am speaking with the correct person using two identifiers.  Location: Patient: Virtual Visit Location Patient:  Home Provider: Virtual Visit Location Provider: Home Office   I discussed the limitations of evaluation and management by telemedicine and the availability of in person appointments. The patient expressed understanding and agreed to proceed.    History of Present Illness: Sherry Glass is a 53 y.o. who identifies as a female who was assigned female at birth, and is being seen today for referral for Neurologists. [facility] and had negative MRI for chronic back pain that radiates down her right leg, but at times can be both legs . She was told to get a referral for Neurologists because the pain radiating down into her legs was not from her back. She has had steroid injections, but only last a month.   HPI: Back Pain This is a chronic problem. The current episode started more than 1 year ago. The problem occurs intermittently. The problem has been waxing and waning since onset. The pain is present in the lumbar spine. The quality of the pain is described as shooting. The pain radiates to the right thigh. The pain is at a severity of 8/10. The pain is moderate. The symptoms are aggravated by standing. Associated symptoms include leg pain and tingling. Risk factors include obesity. She has tried muscle relaxant and NSAIDs for the symptoms. The treatment provided mild relief.  Vaginal Itching The patient's primary symptoms include genital itching. The patient's pertinent negatives include no genital odor or vaginal discharge. Associated symptoms include back pain.  Problems:  Patient Active Problem List   Diagnosis Date Noted   Upper airway cough syndrome 04/22/2023   DOE (dyspnea on exertion) 04/21/2023   Arthropathy of lumbar facet joint 04/20/2023   Chronic bilateral low back pain with sciatica 09/22/2022   Diarrhea 09/09/2022   Adrenal mass (HCC) 11/11/2021   Adhesive capsulitis of left shoulder    Spinal stenosis of lumbar region 07/24/2020   Lumbar  radiculitis 11/21/2019   Lumbar spondylosis 11/21/2019   Lumbar stenosis with neurogenic claudication 11/21/2019   Hepatic steatosis 05/23/2019   Psoriasis 05/09/2019   Depression, major, single episode, mild (HCC) 10/12/2018   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   Nocturnal hypoxemia 05/29/2017   GAD (generalized anxiety disorder) 02/13/2017   Restrictive lung disease 01/27/2017   Lung nodule 01/27/2017   GERD (gastroesophageal reflux disease) 11/18/2016   Snoring 11/18/2016   Witnessed episode of apnea 11/18/2016   Chronic seasonal allergic rhinitis 11/18/2016   Restless leg syndrome 11/18/2016   Fibromyalgia 11/18/2016   Hyperlipidemia associated with type 2 diabetes mellitus (HCC) 06/09/2016   Vitamin D  deficiency 05/16/2016   Obesity (BMI 30-39.9) 05/13/2016   Diabetes mellitus (HCC) 05/13/2016   COPD (chronic obstructive pulmonary disease) (HCC) 05/13/2016   Cigarette smoker 05/13/2016   Chest pain syndrome    Hypertension associated with diabetes (HCC)    Stopped smoking with greater than 25 pack year history     Allergies:  Allergies  Allergen Reactions   Dilaudid [Hydromorphone]     Itching and vomitting   Doxycycline  Itching   Valproic Acid And Related Nausea And Vomiting   Medications:  Current Outpatient Medications:    fluconazole  (DIFLUCAN ) 150 MG tablet, Take 1 tablet (150 mg total) by mouth every three (3) days as needed., Disp: 3 tablet, Rfl: 0   albuterol  (VENTOLIN  HFA) 108 (90 Base) MCG/ACT inhaler, Inhale 2 puffs into the lungs every 6 (six) hours as needed for wheezing or shortness of breath., Disp: 8 g, Rfl: 2   aspirin  EC 81 MG tablet, Take one po qd, Disp: 90 tablet, Rfl: 1   azithromycin  (ZITHROMAX ) 250 MG tablet, Take 500 mg once, then 250 mg for four days, Disp: 6 tablet, Rfl: 0   budesonide -formoterol  (SYMBICORT ) 80-4.5 MCG/ACT inhaler, Take 2 puffs first thing in am and then another 2 puffs about 12 hours later., Disp: 1 each,  Rfl: 12   cyclobenzaprine  (FLEXERIL ) 10 MG tablet, Take 1 tablet (10 mg total) by mouth 3 (three) times daily as needed. for muscle spams, Disp: 90 tablet, Rfl: 0   desvenlafaxine  (PRISTIQ ) 100 MG 24 hr tablet, Take 1 tablet (100 mg total) by mouth daily., Disp: 90 tablet, Rfl: 1   esomeprazole  (NEXIUM ) 40 MG capsule, TAKE 1 CAPSULE BY MOUTH ONCE DAILY BEFORE BREAKFAST, Disp: 30 capsule, Rfl: 5   famotidine  (PEPCID ) 20 MG tablet, One after supper, Disp: 30 tablet, Rfl: 11   gabapentin  (NEURONTIN ) 100 MG capsule, Take 1 capsule (100 mg total) by mouth 3 (three) times daily., Disp: 90 capsule, Rfl: 2   gabapentin  (NEURONTIN ) 300 MG capsule, Take 1 capsule (300 mg total) by mouth 3 (three) times daily., Disp: 90 capsule, Rfl: 2   glimepiride  (AMARYL ) 2 MG tablet, Take 1 tablet (2 mg total) by mouth daily with breakfast., Disp: 90 tablet, Rfl: 1   ibuprofen  (ADVIL ) 800 MG tablet, Take 1 tablet (800 mg total) by mouth 2 (two) times daily., Disp: 180 tablet, Rfl: 0   levothyroxine  (SYNTHROID ) 125 MCG tablet, Take 1 tablet (  125 mcg total) by mouth daily before breakfast., Disp: 95 tablet, Rfl: 3   losartan  (COZAAR ) 100 MG tablet, Take 1 tablet (100 mg total) by mouth daily., Disp: 90 tablet, Rfl: 1   meclizine  (ANTIVERT ) 25 MG tablet, Take 1 tablet (25 mg total) by mouth 3 (three) times daily as needed for dizziness., Disp: 30 tablet, Rfl: 0   metFORMIN  (GLUCOPHAGE ) 1000 MG tablet, Take 1 tablet (1,000 mg total) by mouth 2 (two) times daily with a meal. (NEEDS TO BE SEEN BEFORE NEXT REFILL), Disp: 180 tablet, Rfl: 1   metroNIDAZOLE  (FLAGYL ) 500 MG tablet, Take 1 tablet (500 mg total) by mouth 2 (two) times daily., Disp: 14 tablet, Rfl: 0   rosuvastatin  (CRESTOR ) 10 MG tablet, Take 1 tablet (10 mg total) by mouth daily., Disp: 90 tablet, Rfl: 3   TALTZ 80 MG/ML SOSY, Inject into the skin every 30 (thirty) days., Disp: , Rfl:    TRULICITY  3 MG/0.5ML SOAJ, INJECT 3 MG SUBCUTANEOUSLY ONCE A WEEK, Disp: 4 mL,  Rfl: 2   Vitamin D , Ergocalciferol , (DRISDOL ) 1.25 MG (50000 UNIT) CAPS capsule, Take 1 capsule (50,000 Units total) by mouth every 7 (seven) days., Disp: 12 capsule, Rfl: 3  Observations/Objective: Patient is well-developed, well-nourished in no acute distress.  Resting comfortably  at home.  Head is normocephalic, atraumatic.  No labored breathing.  Speech is clear and coherent with logical content.  Patient is alert and oriented at baseline.  Pain in lumbar with flexion  Assessment and Plan: 1. Chronic bilateral low back pain with bilateral sciatica (Primary) - Ambulatory referral to Neurology  2. Bilateral leg numbness - Ambulatory referral to Neurology  3. Vagina, candidiasis - fluconazole  (DIFLUCAN ) 150 MG tablet; Take 1 tablet (150 mg total) by mouth every three (3) days as needed.  Dispense: 3 tablet; Refill: 0  4. Vaginal itching  Referral to Neurologists pending  Diflucan  Prescription sent to pharmacy  Keep clean and dry  Follow up if symptoms worsen or do not improve   Follow Up Instructions: I discussed the assessment and treatment plan with the patient. The patient was provided an opportunity to ask questions and all were answered. The patient agreed with the plan and demonstrated an understanding of the instructions.  A copy of instructions were sent to the patient via MyChart unless otherwise noted below.    The patient was advised to call back or seek an in-person evaluation if the symptoms worsen or if the condition fails to improve as anticipated.    Bari Learn, [name]

## 2023-06-08 NOTE — Telephone Encounter (Signed)
 Appt today

## 2023-06-08 NOTE — Telephone Encounter (Signed)
 Copied from CRM [id number]. Topic: Clinical - Medication Question >> Jun 08, 2023 10:55 AM Suzette B wrote: Reason for CRM: Patient has called in stated she is needing a referral to see a neurologist due to some back issues, patient states the provider prescribed her some medication previously that is not working for her back and needing some that can help her with the back pain.

## 2023-06-15 ENCOUNTER — Ambulatory Visit: Payer: Medicaid Other | Admitting: Family

## 2023-06-20 ENCOUNTER — Telehealth: Payer: Self-pay | Admitting: Acute Care

## 2023-06-20 ENCOUNTER — Other Ambulatory Visit: Payer: Self-pay

## 2023-06-20 DIAGNOSIS — Z122 Encounter for screening for malignant neoplasm of respiratory organs: Secondary | ICD-10-CM

## 2023-06-20 DIAGNOSIS — Z87891 Personal history of nicotine dependence: Secondary | ICD-10-CM

## 2023-06-20 NOTE — Telephone Encounter (Signed)
Lung Cancer Screening Narrative/Criteria Questionnaire (Cigarette Smokers Only- No Cigars/Pipes/vapes)   Sherry Glass   SDMV:07/05/23 at 10am with NATALIE                                           1970/06/22              LDCT: 07/11/23 at 0930 am / [facility]    53 y.o.   Phone: [phone]  Lung Screening Narrative (confirm age 25-77 yrs Medicare / 50-80 yrs Private pay insurance)   Insurance information:medicaid   Referring Provider:Wert   This screening involves an initial phone call with a team member from our program. It is called a shared decision making visit. The initial meeting is required by insurance and Medicare to make sure you understand the program. This appointment takes about 15-20 minutes to complete. The CT scan will completed at a separate date/time. This scan takes about 5-10 minutes to complete and you may eat and drink before and after the scan.  Criteria questions for Lung Cancer Screening:   Are you a current or former smoker? Former Age began smoking: 53 yo   If you are a former smoker, what year did you quit smoking? 2019 (within 15 yrs)   To calculate your smoking history, I need an accurate estimate of how many packs of cigarettes you smoked per day and for how many years. (Not just the number of PPD you are now smoking)   Years smoking 20 x Packs per day 1 = Pack years 20   (at least 20 pack yrs)   (Make sure they understand that we need to know how much they have smoked in the past, not just the number of PPD they are smoking now)  Do you have a personal history of cancer?  No    Do you have a family history of cancer? No -adopted  Are you coughing up blood?  No  Have you had unexplained weight loss of 15 lbs or more in the last 6 months? No  It looks like you meet all criteria.     Additional information: N/A

## 2023-07-03 ENCOUNTER — Other Ambulatory Visit: Payer: Self-pay | Admitting: Family

## 2023-07-03 DIAGNOSIS — E785 Hyperlipidemia, unspecified: Secondary | ICD-10-CM

## 2023-07-05 ENCOUNTER — Ambulatory Visit (INDEPENDENT_AMBULATORY_CARE_PROVIDER_SITE_OTHER): Payer: Medicaid Other | Admitting: Acute Care

## 2023-07-05 DIAGNOSIS — Z87891 Personal history of nicotine dependence: Secondary | ICD-10-CM

## 2023-07-05 NOTE — Progress Notes (Addendum)
 Provider Attestation I agree with the documentation of the Shared Decision Making visit,  smoking cessation counseling if appropriate, and verification or eligibility for lung cancer screening as documented by the RN Nurse Navigator.   Raejean Bullock, MSN, AGACNP-BC  Pulmonary/Critical Care Medicine See Amion for personal pager PCCM on call pager [phone]     Virtual Visit via Telephone Note  I connected with Sherry Glass on 07/05/23 at 10:00 AM EST by telephone and verified that I am speaking with the correct person using two identifiers.  Location: Patient: Sherry Glass. Sherry Glass Provider: Alyse Bach, RN   I discussed the limitations, risks, security and privacy concerns of performing an evaluation and management service by telephone and the availability of in person appointments. I also discussed with the patient that there may be a patient responsible charge related to this service. The patient expressed understanding and agreed to proceed.   Shared Decision Making Visit Lung Cancer Screening Program [phone])   Eligibility: Age 53 y.o. Pack Years Smoking History Calculation 20 (# packs/per year x # years smoked) Recent History of coughing up blood  no Unexplained weight loss? no ( >Than 15 pounds within the last 6 months ) Prior History Lung / other cancer no (Diagnosis within the last 5 years already requiring surveillance chest CT Scans). Smoking Status Former Smoker Former Smokers: Years since quit: 6 years  Quit Date: 2019  Visit Components: Discussion included one or more decision making aids. yes Discussion included risk/benefits of screening. yes Discussion included potential follow up diagnostic testing for abnormal scans. yes Discussion included meaning and risk of over diagnosis. yes Discussion included meaning and risk of False Positives. yes Discussion included meaning of total radiation exposure. yes  Counseling Included: Importance of adherence  to annual lung cancer LDCT screening. yes Impact of comorbidities on ability to participate in the program. yes Ability and willingness to under diagnostic treatment. yes  Smoking Cessation Counseling: Current Smokers:  Discussed importance of smoking cessation. yes Information about tobacco cessation classes and interventions provided to patient. yes Patient provided with "ticket" for LDCT Scan. no Symptomatic Patient. no  Counseling(Intermediate counseling: > three minutes) 99406 Diagnosis Code: Tobacco Use Z72.0 Asymptomatic Patient yes  Counseling (Intermediate counseling: > three minutes counseling) [id number] Former Smokers:  Discussed the importance of maintaining cigarette abstinence. yes Diagnosis Code: Personal History of Nicotine Dependence. G95.621 Information about tobacco cessation classes and interventions provided to patient. Yes Patient provided with "ticket" for LDCT Scan. no Written Order for Lung Cancer Screening with LDCT placed in Epic. Yes (CT Chest Lung Cancer Screening Low Dose W/O CM) [id number] Z12.2-Screening of respiratory organs Z87.891-Personal history of nicotine dependence   Alyse Bach, RN

## 2023-07-05 NOTE — Patient Instructions (Signed)

## 2023-07-11 ENCOUNTER — Ambulatory Visit (HOSPITAL_COMMUNITY)
Admission: RE | Admit: 2023-07-11 | Discharge: 2023-07-11 | Disposition: A | Discharge status: Home / Self Care | Payer: Medicaid Other | Source: Ambulatory Visit | Attending: Acute Care | Admitting: Acute Care

## 2023-07-11 DIAGNOSIS — Z87891 Personal history of nicotine dependence: Secondary | ICD-10-CM | POA: Insufficient documentation

## 2023-07-11 DIAGNOSIS — Z122 Encounter for screening for malignant neoplasm of respiratory organs: Secondary | ICD-10-CM | POA: Insufficient documentation

## 2023-08-03 ENCOUNTER — Other Ambulatory Visit

## 2023-08-07 ENCOUNTER — Other Ambulatory Visit: Payer: Self-pay | Admitting: Family

## 2023-08-07 DIAGNOSIS — E1169 Type 2 diabetes mellitus with other specified complication: Secondary | ICD-10-CM

## 2023-08-09 ENCOUNTER — Telehealth: Payer: Self-pay

## 2023-08-09 LAB — HM DIABETES EYE EXAM

## 2023-08-09 NOTE — Telephone Encounter (Signed)
 Called to schedule appointment with provider. Appointment scheduled for 08/22/23 at 11:25. [facility] 08/09/23

## 2023-08-10 ENCOUNTER — Other Ambulatory Visit

## 2023-08-10 DIAGNOSIS — E1169 Type 2 diabetes mellitus with other specified complication: Secondary | ICD-10-CM

## 2023-08-10 DIAGNOSIS — E1159 Type 2 diabetes mellitus with other circulatory complications: Secondary | ICD-10-CM

## 2023-08-10 DIAGNOSIS — E785 Hyperlipidemia, unspecified: Secondary | ICD-10-CM

## 2023-08-10 LAB — BAYER DCA HB A1C WAIVED: HB A1C (BAYER DCA - WAIVED): 7.8 % — ABNORMAL HIGH (ref 4.8–5.6)

## 2023-08-11 ENCOUNTER — Telehealth: Payer: Self-pay | Admitting: Pharmacy Technician

## 2023-08-11 ENCOUNTER — Other Ambulatory Visit (HOSPITAL_COMMUNITY): Payer: Self-pay

## 2023-08-11 ENCOUNTER — Other Ambulatory Visit: Payer: Self-pay | Admitting: Family

## 2023-08-11 DIAGNOSIS — H5213 Myopia, bilateral: Secondary | ICD-10-CM | POA: Diagnosis not present

## 2023-08-11 DIAGNOSIS — E1169 Type 2 diabetes mellitus with other specified complication: Secondary | ICD-10-CM

## 2023-08-11 LAB — CMP14+EGFR
ALT: 24 IU/L (ref 0–32)
AST: 24 IU/L (ref 0–40)
Albumin: 4.4 g/dL (ref 3.8–4.9)
Alkaline Phosphatase: 87 IU/L (ref 44–121)
BUN/Creatinine Ratio: 15 (ref 9–23)
BUN: 11 mg/dL (ref 6–24)
Bilirubin Total: 0.6 mg/dL (ref 0.0–1.2)
CO2: 21 mmol/L (ref 20–29)
Calcium: 9.8 mg/dL (ref 8.7–10.2)
Chloride: 104 mmol/L (ref 96–106)
Creatinine, Ser: 0.72 mg/dL (ref 0.57–1.00)
Globulin, Total: 2.4 g/dL (ref 1.5–4.5)
Glucose: 123 mg/dL — ABNORMAL HIGH (ref 70–99)
Potassium: 4.3 mmol/L (ref 3.5–5.2)
Sodium: 141 mmol/L (ref 134–144)
Total Protein: 6.8 g/dL (ref 6.0–8.5)
eGFR: 99 mL/min/{1.73_m2} (ref >59)

## 2023-08-11 LAB — CBC WITH DIFFERENTIAL/PLATELET
Basophils Absolute: 0.1 10*3/uL (ref 0.0–0.2)
Basos: 1 %
EOS (ABSOLUTE): 0.2 10*3/uL (ref 0.0–0.4)
Eos: 2 %
Hematocrit: 40.5 % (ref 34.0–46.6)
Hemoglobin: 13.4 g/dL (ref 11.1–15.9)
Immature Grans (Abs): 0 10*3/uL (ref 0.0–0.1)
Immature Granulocytes: 0 %
Lymphocytes Absolute: 4.7 10*3/uL — ABNORMAL HIGH (ref 0.7–3.1)
Lymphs: 53 %
MCH: 31.8 pg (ref 26.6–33.0)
MCHC: 33.1 g/dL (ref 31.5–35.7)
MCV: 96 fL (ref 79–97)
Monocytes Absolute: 0.5 10*3/uL (ref 0.1–0.9)
Monocytes: 6 %
Neutrophils Absolute: 3.4 10*3/uL (ref 1.4–7.0)
Neutrophils: 38 %
Platelets: 271 10*3/uL (ref 150–450)
RBC: 4.21 x10E6/uL (ref 3.77–5.28)
RDW: 13.8 % (ref 11.7–15.4)
WBC: 8.9 10*3/uL (ref 3.4–10.8)

## 2023-08-11 LAB — LIPID PANEL
Chol/HDL Ratio: 3 ratio (ref 0.0–4.4)
Cholesterol, Total: 128 mg/dL (ref 100–199)
HDL: 42 mg/dL (ref >39)
LDL Chol Calc (NIH): 52 mg/dL (ref 0–99)
Triglycerides: 211 mg/dL — ABNORMAL HIGH (ref 0–149)
VLDL Cholesterol Cal: 34 mg/dL (ref 5–40)

## 2023-08-11 LAB — VITAMIN B12: Vitamin B-12: 1480 pg/mL — ABNORMAL HIGH (ref 232–1245)

## 2023-08-11 MED ORDER — OZEMPIC (2 MG/DOSE) 8 MG/3ML ~~LOC~~ SOPN: 2.0000 mg | PEN_INJECTOR | SUBCUTANEOUS | 2 refills | Status: DC

## 2023-08-11 NOTE — Telephone Encounter (Signed)
 Pharmacy Patient Advocate Encounter   Received notification from CoverMyMeds that prior authorization for Ozempic (2 MG/DOSE) 8MG /3ML pen-injectors is required/requested.   Insurance verification completed.   The patient is insured through [facility] .   Per test claim: The current 28 day co-pay is, $4.00.  No PA needed at this time. This test claim was processed through [facility]- copay amounts may vary at other pharmacies due to pharmacy/plan contracts, or as the patient moves through the different stages of their insurance plan.

## 2023-08-14 ENCOUNTER — Telehealth: Payer: Self-pay | Admitting: Acute Care

## 2023-08-14 ENCOUNTER — Other Ambulatory Visit: Payer: Self-pay

## 2023-08-14 DIAGNOSIS — Z122 Encounter for screening for malignant neoplasm of respiratory organs: Secondary | ICD-10-CM

## 2023-08-14 DIAGNOSIS — R911 Solitary pulmonary nodule: Secondary | ICD-10-CM

## 2023-08-14 DIAGNOSIS — F17210 Nicotine dependence, cigarettes, uncomplicated: Secondary | ICD-10-CM

## 2023-08-14 DIAGNOSIS — Z87891 Personal history of nicotine dependence: Secondary | ICD-10-CM

## 2023-08-14 NOTE — Telephone Encounter (Signed)
 Spoke with patient and reviewed results. Advised 6 month follow up for a 15.3 mm ground glass nodule. Pt is in agreement. Reviewed aortic atherosclerosis and emphysema. She is on a statin. 6 month follow scan order placed. Results and plan to PCP.

## 2023-08-14 NOTE — Telephone Encounter (Signed)
 Patient had first LDCT on 07/11/2023 that resulted as an LR2. There is a 15.78mm ground glass nodule. Dara Ear NP has reviewed results and patient's chart and suggested patient have a 6 month follow up scan to assure nodule stability, due 01/11/2025. Will need to call patient to discuss results and recommendations and send info to PCP.   IMPRESSION: Lung-RADS 2, benign appearance or behavior. Continue annual screening with low-dose chest CT without contrast in 12 months.   Aortic Atherosclerosis (ICD10-I70.0) and Emphysema (ICD10-J43.9).     Electronically Signed   By: Levell Reach M.D.   On: 08/07/2023 12:08

## 2023-08-22 ENCOUNTER — Ambulatory Visit (INDEPENDENT_AMBULATORY_CARE_PROVIDER_SITE_OTHER): Admitting: Family

## 2023-08-22 VITALS — BP 118/69 | HR 75 | Temp 97.3°F | Ht 64.0 in | Wt 205.2 lb

## 2023-08-22 DIAGNOSIS — E559 Vitamin D deficiency, unspecified: Secondary | ICD-10-CM | POA: Diagnosis not present

## 2023-08-22 DIAGNOSIS — Z0001 Encounter for general adult medical examination with abnormal findings: Secondary | ICD-10-CM | POA: Diagnosis not present

## 2023-08-22 DIAGNOSIS — M5442 Lumbago with sciatica, left side: Secondary | ICD-10-CM | POA: Diagnosis not present

## 2023-08-22 DIAGNOSIS — F320 Major depressive disorder, single episode, mild: Secondary | ICD-10-CM | POA: Diagnosis not present

## 2023-08-22 DIAGNOSIS — E1169 Type 2 diabetes mellitus with other specified complication: Secondary | ICD-10-CM | POA: Diagnosis not present

## 2023-08-22 DIAGNOSIS — F411 Generalized anxiety disorder: Secondary | ICD-10-CM

## 2023-08-22 DIAGNOSIS — K219 Gastro-esophageal reflux disease without esophagitis: Secondary | ICD-10-CM | POA: Diagnosis not present

## 2023-08-22 DIAGNOSIS — Z0000 Encounter for general adult medical examination without abnormal findings: Secondary | ICD-10-CM

## 2023-08-22 DIAGNOSIS — E1159 Type 2 diabetes mellitus with other circulatory complications: Secondary | ICD-10-CM

## 2023-08-22 DIAGNOSIS — J441 Chronic obstructive pulmonary disease with (acute) exacerbation: Secondary | ICD-10-CM

## 2023-08-22 DIAGNOSIS — E1165 Type 2 diabetes mellitus with hyperglycemia: Secondary | ICD-10-CM | POA: Diagnosis not present

## 2023-08-22 DIAGNOSIS — G8929 Other chronic pain: Secondary | ICD-10-CM

## 2023-08-22 DIAGNOSIS — R079 Chest pain, unspecified: Secondary | ICD-10-CM | POA: Diagnosis not present

## 2023-08-22 MED ORDER — ROSUVASTATIN CALCIUM 10 MG PO TABS: 10.0000 mg | ORAL_TABLET | Freq: Every day | ORAL | 0 refills | Status: DC

## 2023-08-22 MED ORDER — LOSARTAN POTASSIUM 100 MG PO TABS: 100.0000 mg | ORAL_TABLET | Freq: Every day | ORAL | 1 refills | Status: DC

## 2023-08-22 MED ORDER — OZEMPIC (2 MG/DOSE) 8 MG/3ML ~~LOC~~ SOPN: 2.0000 mg | PEN_INJECTOR | SUBCUTANEOUS | 2 refills | Status: DC

## 2023-08-22 MED ORDER — METFORMIN HCL 1000 MG PO TABS: 1000.0000 mg | ORAL_TABLET | Freq: Two times a day (BID) | ORAL | 1 refills | Status: DC

## 2023-08-22 MED ORDER — DESVENLAFAXINE SUCCINATE ER 100 MG PO TB24: 100.0000 mg | ORAL_TABLET | Freq: Every day | ORAL | 1 refills | Status: DC

## 2023-08-22 MED ORDER — BUSPIRONE HCL 5 MG PO TABS
5.0000 mg | ORAL_TABLET | Freq: Three times a day (TID) | ORAL | 2 refills | Status: AC | PRN
Start: 2023-08-22 — End: ?

## 2023-08-22 NOTE — Patient Instructions (Signed)

## 2023-08-22 NOTE — Progress Notes (Signed)
 Subjective:    Patient ID: Sherry Glass, female    DOB: 18-May-1970, 53 y.o.   MRN: [id number]  Chief Complaint  Patient presents with   Follow-up    [facility] and ct   Pt presents to the office today for CPE and  chronic follow up.    Pt has seen Pulmonologist for COPD, Restrictive lung disease, and SOB, but has been cleared. She quit smoking 2018.  She had thyroid  ablation.   She is followed by [facility] as needed for chronic back pain. Had a MRI 05/22/22.    She is morbid obese with a BMI of 35 and DM and HTN.   COPD She complains of cough, hoarse voice and shortness of breath. There is no wheezing. This is a chronic problem. The current episode started more than 1 year ago. The problem occurs intermittently. Associated symptoms include chest pain, heartburn and malaise/fatigue. Her symptoms are alleviated by rest. She reports moderate improvement on treatment. Her past medical history is significant for COPD.  Diabetes She presents for her follow-up diabetic visit. She has type 2 diabetes mellitus. Hypoglycemia symptoms include nervousness/anxiousness. Associated symptoms include chest pain. Pertinent negatives for diabetes include no blurred vision and no foot paresthesias. Symptoms are stable. Risk factors for coronary artery disease include diabetes mellitus, dyslipidemia, hypertension and sedentary lifestyle. She is following a generally healthy diet. Her overall blood glucose range is 110-130 mg/dl. Eye exam is current.  Hypertension This is a chronic problem. The current episode started more than 1 year ago. The problem has been resolved since onset. The problem is controlled. Associated symptoms include anxiety, chest pain, malaise/fatigue, palpitations and shortness of breath. Pertinent negatives include no blurred vision or peripheral edema. Risk factors for coronary artery disease include dyslipidemia, diabetes mellitus and sedentary lifestyle. The current treatment provides  moderate improvement.  Gastroesophageal Reflux She complains of belching, chest pain, coughing, heartburn and a hoarse voice. She reports no wheezing. This is a chronic problem. The current episode started more than 1 year ago. The problem occurs rarely. The symptoms are aggravated by certain foods. Risk factors include obesity. She has tried a PPI for the symptoms. The treatment provided moderate relief.  Hyperlipidemia This is a chronic problem. The current episode started more than 1 year ago. The problem is controlled. Recent lipid tests were reviewed and are normal. Exacerbating diseases include obesity. Associated symptoms include chest pain and shortness of breath. Current antihyperlipidemic treatment includes statins. The current treatment provides moderate improvement of lipids. Risk factors for coronary artery disease include dyslipidemia, diabetes mellitus, hypertension, a sedentary lifestyle and post-menopausal.  Back Pain This is a chronic problem. The current episode started more than 1 year ago. The problem occurs intermittently. The problem has been waxing and waning since onset. The pain is present in the lumbar spine. The quality of the pain is described as aching. The pain is at a severity of 7/10. The pain is moderate. Associated symptoms include chest pain. Risk factors include obesity. She has tried bed rest and NSAIDs for the symptoms. The treatment provided moderate relief.  Anxiety Presents for follow-up visit. Symptoms include chest pain, depressed mood, excessive worry, irritability, nervous/anxious behavior, palpitations and shortness of breath. Patient reports no decreased concentration or suicidal ideas. Symptoms occur constantly. The severity of symptoms is moderate.    Depression        This is a chronic problem.  The current episode started more than 1 year ago.   The problem occurs  intermittently.  Associated symptoms include helplessness, hopelessness and sad.   Associated symptoms include no decreased concentration and no suicidal ideas.  Past treatments include SNRIs - Serotonin and norepinephrine reuptake inhibitors.  Past medical history includes anxiety.   Chest Pain  This is a new problem. The current episode started more than 1 month ago. The problem occurs intermittently. The pain is mild. The quality of the pain is described as dull. Associated symptoms include back pain, a cough, malaise/fatigue, palpitations and shortness of breath.      Review of Systems  Constitutional:  Positive for irritability and malaise/fatigue.  HENT:  Positive for hoarse voice.   Eyes:  Negative for blurred vision.  Respiratory:  Positive for cough and shortness of breath. Negative for wheezing.   Cardiovascular:  Positive for chest pain and palpitations.  Gastrointestinal:  Positive for heartburn.  Musculoskeletal:  Positive for back pain.  Psychiatric/Behavioral:  Negative for decreased concentration and suicidal ideas. The patient is nervous/anxious.   All other systems reviewed and are negative.  Family History  Adopted: Yes  Problem Relation Age of Onset   Breast cancer Neg Hx    Social History   Socioeconomic History   Marital status: Widowed    Spouse name: Not on file   Number of children: 4   Years of education: Not on file   Highest education level: 12th grade  Occupational History   Not on file  Tobacco Use   Smoking status: Former    Current packs/day: 0.00    Average packs/day: 1 pack/day for 20.0 years (20.0 ttl pk-yrs)    Types: Cigarettes    Start date: 11/13/1996    Quit date: 10/28/2016    Years since quitting: 6.8   Smokeless tobacco: Never  Vaping Use   Vaping status: Never Used  Substance and Sexual Activity   Alcohol use: Not Currently    Comment: For special occasions, very rare    Drug use: No   Sexual activity: Never    Birth control/protection: Surgical  Other Topics Concern   Not on file  Social History  Narrative   [facility] (11/18/16):   Originally from [place] but grew up in [place]. Cared for her husband until he passed. 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 Does have significant dust exposure. Has 3 dogs currently. Previously had a parakeet as a child. No mold exposure. Enjoys playing with her grandchildren.        Social Drivers of Corporate investment banker Strain: Low Risk  (08/22/2023)   Overall Financial Resource Strain (CARDIA)    Difficulty of Paying Living Expenses: Not very hard  Food Insecurity: Food Insecurity Present (08/22/2023)   Hunger Vital Sign    Worried About Running Out of Food in the Last Year: Sometimes true    Ran Out of Food in the Last Year: Sometimes true  Transportation Needs: No Transportation Needs (08/22/2023)   PRAPARE - Administrator, Civil Service (Medical): No    Lack of Transportation (Non-Medical): No  Physical Activity: Insufficiently Active (08/22/2023)   Exercise Vital Sign    Days of Exercise per Week: 4 days    Minutes of Exercise per Session: 30 min  Stress: Stress Concern Present (08/22/2023)   [employer] of Occupational Health - Occupational Stress Questionnaire    Feeling of Stress : Rather much  Social Connections: Socially Isolated (08/22/2023)   Social Connection and Isolation Panel [NHANES]  Frequency of Communication with Friends and Family: More than three times a week    Frequency of Social Gatherings with Friends and Family: More than three times a week    Attends Religious Services: Never    Database administrator or Organizations: No    Attends Banker Meetings: Not on file    Marital Status: Widowed       Objective:   Physical Exam Vitals reviewed.  Constitutional:      General: She is not in acute distress.    Appearance: She is well-developed. She is obese.  HENT:     Head: Normocephalic and atraumatic.     Right Ear: Tympanic membrane  normal.     Left Ear: Tympanic membrane normal.  Eyes:     Pupils: Pupils are equal, round, and reactive to light.  Neck:     Thyroid : No thyromegaly.  Cardiovascular:     Rate and Rhythm: Normal rate and regular rhythm.     Heart sounds: Normal heart sounds. No murmur heard. Pulmonary:     Effort: Pulmonary effort is normal. No respiratory distress.     Breath sounds: Wheezing present.  Abdominal:     General: Bowel sounds are normal. There is no distension.     Palpations: Abdomen is soft.     Tenderness: There is no abdominal tenderness.  Musculoskeletal:        General: No tenderness. Normal range of motion.     Cervical back: Normal range of motion and neck supple.  Skin:    General: Skin is warm and dry.  Neurological:     Mental Status: She is alert and oriented to person, place, and time.     Cranial Nerves: No cranial nerve deficit.     Deep Tendon Reflexes: Reflexes are normal and symmetric.  Psychiatric:        Mood and Affect: Mood is anxious.        Behavior: Behavior normal.        Thought Content: Thought content normal.        Judgment: Judgment normal.      BP 118/69   Pulse 75   Temp (!) 97.3 F (36.3 C) (Temporal)   Ht 5\' 4"  (1.626 m)   Wt 205 lb 3.2 oz (93.1 kg)   BMI 35.22 kg/m      Assessment & Plan:  RAELA BOHL comes in today with chief complaint of Follow-up ([facility] and ct)   Diagnosis and orders addressed:  1. GAD (generalized anxiety disorder) Will add Buspar   5 mg TID Prn  Stress management  - desvenlafaxine  (PRISTIQ ) 100 MG 24 hr tablet; Take 1 tablet (100 mg total) by mouth daily.  Dispense: 90 tablet; Refill: 1 - busPIRone  (BUSPAR ) 5 MG tablet; Take 1 tablet (5 mg total) by mouth 3 (three) times daily as needed.  Dispense: 90 tablet; Refill: 2  2. Depression, major, single episode, mild (HCC) - desvenlafaxine  (PRISTIQ ) 100 MG 24 hr tablet; Take 1 tablet (100 mg total) by mouth daily.  Dispense: 90 tablet; Refill: 1  3.  Hypertension associated with diabetes (HCC) - losartan  (COZAAR ) 100 MG tablet; Take 1 tablet (100 mg total) by mouth daily.  Dispense: 90 tablet; Refill: 1  4. Uncontrolled type 2 diabetes mellitus with hyperglycemia (HCC) - metFORMIN  (GLUCOPHAGE ) 1000 MG tablet; Take 1 tablet (1,000 mg total) by mouth 2 (two) times daily with a meal. (NEEDS TO BE SEEN BEFORE NEXT REFILL)  Dispense: 180 tablet;  Refill: 1  5. Hyperlipidemia associated with type 2 diabetes mellitus (HCC) - rosuvastatin  (CRESTOR ) 10 MG tablet; Take 1 tablet (10 mg total) by mouth daily.  Dispense: 90 tablet; Refill: 0 - Ambulatory referral to Cardiology  6. Morbid obesity (HCC)  7. Type 2 diabetes mellitus with other specified complication, without long-term current use of insulin  (HCC) - Semaglutide , 2 MG/DOSE, (OZEMPIC , 2 MG/DOSE,) 8 MG/3ML SOPN; Inject 2 mg into the skin once a week.  Dispense: 3 mL; Refill: 2  8. Chronic obstructive pulmonary disease with acute exacerbation (HCC)  9. Vitamin D  deficiency  10. Gastroesophageal reflux disease, unspecified whether esophagitis present   11. Chronic bilateral low back pain with bilateral sciatica   12. Chest pain, exertional - Ambulatory referral to Cardiology - EKG 12-Lead  13. Annual physical exam (Primary)    Labs reviewed from Neurologists last month Referral to Cardiologists pending, if chest pain worsens go to ED.  Continue current medications Will add Buspar  5 mg TID prn  Stress management   Health Maintenance reviewed Diet and exercise encouraged  Follow up plan: 3 months    Tommas Fragmin, [name]

## 2023-08-30 ENCOUNTER — Telehealth: Payer: Self-pay

## 2023-08-30 ENCOUNTER — Other Ambulatory Visit (HOSPITAL_COMMUNITY): Payer: Self-pay

## 2023-08-30 NOTE — Telephone Encounter (Signed)
 Pharmacy Patient Advocate Encounter   Received notification from Patient Advice Request messages that prior authorization for Ozempic  8mg /18ml is required/requested.   Insurance verification completed.   The patient is insured through [facility] .   Per test claim: PA required; PA submitted to above mentioned insurance via CoverMyMeds Key/confirmation #/EOC [id number] Status is pending

## 2023-08-30 NOTE — Telephone Encounter (Signed)
 Pharmacy Patient Advocate Encounter  Received notification from [facility] that Prior Authorization for Ozempic   has been APPROVED from 08/30/23 to 08/29/24   PA #/Case ID/Reference #:  [id number]  Left a message at [facility] to notify of the approval

## 2023-08-31 ENCOUNTER — Encounter: Payer: Self-pay | Admitting: Family

## 2023-09-05 NOTE — Telephone Encounter (Signed)
 Do not take anymore shots- let this one wear off and we will have christy address dosae when she returns.

## 2023-09-19 MED ORDER — SEMAGLUTIDE(0.25 OR 0.5MG/DOS) 2 MG/3ML ~~LOC~~ SOPN: 0.2500 mg | PEN_INJECTOR | SUBCUTANEOUS | 1 refills | Status: DC

## 2023-09-20 ENCOUNTER — Ambulatory Visit: Payer: Medicaid Other | Admitting: Diagnostic Neuroimaging

## 2023-09-20 ENCOUNTER — Encounter: Payer: Self-pay | Admitting: Diagnostic Neuroimaging

## 2023-09-20 VITALS — BP 141/89 | HR 78 | Ht 64.0 in | Wt 205.0 lb

## 2023-09-20 DIAGNOSIS — R799 Abnormal finding of blood chemistry, unspecified: Secondary | ICD-10-CM | POA: Diagnosis not present

## 2023-09-20 DIAGNOSIS — M79605 Pain in left leg: Secondary | ICD-10-CM

## 2023-09-20 DIAGNOSIS — M545 Low back pain, unspecified: Secondary | ICD-10-CM | POA: Diagnosis not present

## 2023-09-20 DIAGNOSIS — G8929 Other chronic pain: Secondary | ICD-10-CM

## 2023-09-20 DIAGNOSIS — R200 Anesthesia of skin: Secondary | ICD-10-CM | POA: Diagnosis not present

## 2023-09-20 DIAGNOSIS — M79604 Pain in right leg: Secondary | ICD-10-CM | POA: Diagnosis not present

## 2023-09-20 NOTE — Patient Instructions (Signed)
  CHRONIC LOW BACK PAIN (likely related to muscle strain, deconditioning, weight) - refer to PT evaluation for strength, mobility training  LEG NUMBNESS / PAIN (likely due to diabetic neuropathy; also history of RLS on gabapentin ) - continue diabetes treatment - advised on foot hygiene and precautions - check iron studies panel (for RLS) - Consider painful neuropathy treatment options: -duloxetine  30-60mg  daily, amitriptyline 25-50mg  at bedtime, gabapentin  100-300mg  three times a day, pregabalin 75-150mg  twice a day -capsaicin cream, lidocaine  patch / cream, alpha-lipoic acid 600mg  daily

## 2023-09-20 NOTE — Progress Notes (Signed)
 [facility]  PATIENT: Sherry Glass DOB: 1970/11/09  REFERRING CLINICIAN: Yevette Hem, [name] HISTORY FROM: patient  REASON FOR VISIT: new consult   HISTORICAL  CHIEF COMPLAINT:  Chief Complaint  Patient presents with   Numbness    Rm 6 alone  Pt is well, reports she has been having bilateral back pain and leg numbness/tingling for about 4 yrs. She has pain in her hips. Symptoms have progressed overtime.  No onset triggers she is aware of.     HISTORY OF PRESENT ILLNESS:   53 year old female with history of diabetes here for evaluation of lower extremity numbness and pain.  History of diabetes since 2009.  A1c were as high as over 10 in 2022.  Around that time patient started to have increasing low back pain, numbness and pain in the lower extremities.  Symptoms worsened over time.  Has been to orthopedic clinic, neurosurgery clinic, PCP and had MRI of the thoracic and lumbar spine for evaluation.  No surgically treatable issues were noted.  Has not tried physical therapy.  Also has restless leg syndrome since 2006.  Has been on gabapentin  for this.   REVIEW OF SYSTEMS: Full 14 system review of systems performed and negative with exception of: as per Hpi.  ALLERGIES: Allergies  Allergen Reactions   Dilaudid [Hydromorphone]     Itching and vomitting   Doxycycline  Itching   Valproic Acid And Related Nausea And Vomiting    HOME MEDICATIONS: Outpatient Medications Prior to Visit  Medication Sig Dispense Refill   albuterol  (VENTOLIN  HFA) 108 (90 Base) MCG/ACT inhaler Inhale 2 puffs into the lungs every 6 (six) hours as needed for wheezing or shortness of breath. 8 g 2   aspirin  EC 81 MG tablet Take one po qd 90 tablet 1   budesonide -formoterol  (SYMBICORT ) 80-4.5 MCG/ACT inhaler Take 2 puffs first thing in am and then another 2 puffs about 12 hours later. 1 each 12   busPIRone  (BUSPAR ) 5 MG tablet Take 1 tablet (5 mg total) by mouth 3 (three) times daily  as needed. 90 tablet 2   cyclobenzaprine  (FLEXERIL ) 10 MG tablet Take 1 tablet (10 mg total) by mouth 3 (three) times daily as needed. for muscle spams 90 tablet 0   desvenlafaxine  (PRISTIQ ) 100 MG 24 hr tablet Take 1 tablet (100 mg total) by mouth daily. 90 tablet 1   escitalopram  (LEXAPRO ) 20 MG tablet Take 20 mg by mouth daily.     esomeprazole  (NEXIUM ) 40 MG capsule TAKE 1 CAPSULE BY MOUTH ONCE DAILY BEFORE BREAKFAST 30 capsule 5   famotidine  (PEPCID ) 20 MG tablet One after supper 30 tablet 11   gabapentin  (NEURONTIN ) 100 MG capsule Take 1 capsule (100 mg total) by mouth 3 (three) times daily. 90 capsule 2   glimepiride  (AMARYL ) 2 MG tablet Take 1 tablet (2 mg total) by mouth daily with breakfast. 90 tablet 1   ibuprofen  (ADVIL ) 800 MG tablet Take 1 tablet (800 mg total) by mouth 2 (two) times daily. 180 tablet 0   levothyroxine  (SYNTHROID ) 125 MCG tablet Take 1 tablet (125 mcg total) by mouth daily before breakfast. 95 tablet 3   losartan  (COZAAR ) 100 MG tablet Take 1 tablet (100 mg total) by mouth daily. 90 tablet 1   metFORMIN  (GLUCOPHAGE ) 1000 MG tablet Take 1 tablet (1,000 mg total) by mouth 2 (two) times daily with a meal. (NEEDS TO BE SEEN BEFORE NEXT REFILL) 180 tablet 1   rosuvastatin  (CRESTOR ) 10 MG tablet Take  1 tablet (10 mg total) by mouth daily. 90 tablet 0   Semaglutide ,0.25 or 0.5MG /DOS, 2 MG/3ML SOPN Inject 0.25 mg into the skin once a week. 3 mL 1   TALTZ 80 MG/ML SOSY Inject into the skin every 30 (thirty) days.     Vitamin D , Ergocalciferol , (DRISDOL ) 1.25 MG (50000 UNIT) CAPS capsule Take 1 capsule (50,000 Units total) by mouth every 7 (seven) days. 12 capsule 3   gabapentin  (NEURONTIN ) 300 MG capsule Take 1 capsule (300 mg total) by mouth 3 (three) times daily. (Patient not taking: Reported on 09/20/2023) 90 capsule 2   No facility-administered medications prior to visit.    PAST MEDICAL HISTORY: Past Medical History:  Diagnosis Date   Allergic rhinitis    Anxiety  and depression    Asthma    pt states this doesn't really bother her anymore   Cataract    Chest pain syndrome    COPD (chronic obstructive pulmonary disease) (HCC)    Depression    Diabetes mellitus without complication (HCC)    History of kidney stones    HTN (hypertension)    Hypercholesterolemia    Hyperglycemia    Hypertension    Hypothyroidism    N&V (nausea and vomiting) 01/08/2021   Obesity    Restless leg syndrome    Tobacco abuse     PAST SURGICAL HISTORY: Past Surgical History:  Procedure Laterality Date   CARDIAC CATHETERIZATION  2014   CATARACT EXTRACTION Right 2023   CATARACT EXTRACTION W/PHACO Left 06/23/2016   Procedure: CATARACT EXTRACTION PHACO AND INTRAOCULAR LENS PLACEMENT (IOC);  Surgeon: Anner Kill, MD;  Location: [facility];  Service: Ophthalmology;  Laterality: Left;  left - pt knows to arrive at 10:50 cde 9.11   CESAREAN SECTION     CHOLECYSTECTOMY     COLONOSCOPY WITH PROPOFOL  N/A 10/14/2019   Procedure: COLONOSCOPY WITH PROPOFOL ;  Surgeon: Suzette Espy, MD;  Location: [facility];  Service: Endoscopy;  Laterality: N/A;  8:00   ESOPHAGOGASTRODUODENOSCOPY (EGD) WITH PROPOFOL  N/A 02/01/2021   Surgeon: Suzette Espy, MD; normal exam   EXCISION MASS NECK     cyst   left foot surgery     removal of cyst   POLYPECTOMY  10/14/2019   Procedure: POLYPECTOMY;  Surgeon: Suzette Espy, MD;  Location: [facility];  Service: Endoscopy;;   RADICAL ABDOMINAL HYSTERECTOMY     SHOULDER ARTHROSCOPY Left 08/05/2021   Procedure: left shoulder arthroscopy, manipulation under anesthesia, rotator interval release;  Surgeon: Jasmine Mesi, MD;  Location: [facility] OR;  Service: Orthopedics;  Laterality: Left;   THYROID  SURGERY     Dr. Larrie Po   TONSILLECTOMY      FAMILY HISTORY: Family History  Adopted: Yes  Problem Relation Age of Onset   Breast cancer Neg Hx     SOCIAL HISTORY: Social History   Socioeconomic History   Marital status: Widowed     Spouse name: Not on file   Number of children: 4   Years of education: Not on file   Highest education level: 12th grade  Occupational History   Not on file  Tobacco Use   Smoking status: Former    Current packs/day: 0.00    Average packs/day: 1 pack/day for 20.0 years (20.0 ttl pk-yrs)    Types: Cigarettes    Start date: 11/13/1996    Quit date: 10/28/2016    Years since quitting: 6.8   Smokeless tobacco: Never  Vaping Use   Vaping status: Never  Used  Substance and Sexual Activity   Alcohol use: Not Currently    Comment: For special occasions, very rare    Drug use: No   Sexual activity: Never    Birth control/protection: Surgical  Other Topics Concern   Not on file  Social History Narrative   [facility] (11/18/16):   Originally from [place] but grew up in [place]. Cared for her husband until he passed. 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 Does have significant dust exposure. Has 3 dogs currently. Previously had a parakeet as a child. No mold exposure. Enjoys playing with her grandchildren.        Social Drivers of Corporate investment banker Strain: Low Risk  (08/22/2023)   Overall Financial Resource Strain (CARDIA)    Difficulty of Paying Living Expenses: Not very hard  Food Insecurity: Food Insecurity Present (08/22/2023)   Hunger Vital Sign    Worried About Running Out of Food in the Last Year: Sometimes true    Ran Out of Food in the Last Year: Sometimes true  Transportation Needs: No Transportation Needs (08/22/2023)   PRAPARE - Administrator, Civil Service (Medical): No    Lack of Transportation (Non-Medical): No  Physical Activity: Insufficiently Active (08/22/2023)   Exercise Vital Sign    Days of Exercise per Week: 4 days    Minutes of Exercise per Session: 30 min  Stress: Stress Concern Present (08/22/2023)   [employer] of Occupational Health - Occupational Stress Questionnaire    Feeling of  Stress : Rather much  Social Connections: Socially Isolated (08/22/2023)   Social Connection and Isolation Panel [NHANES]    Frequency of Communication with Friends and Family: More than three times a week    Frequency of Social Gatherings with Friends and Family: More than three times a week    Attends Religious Services: Never    Database administrator or Organizations: No    Attends Banker Meetings: Not on file    Marital Status: Widowed  Intimate Partner Violence: Not on file     PHYSICAL EXAM  GENERAL EXAM/CONSTITUTIONAL: Vitals:  Vitals:   09/20/23 0911  BP: (!) 141/89  Pulse: 78  Weight: 205 lb (93 kg)  Height: 5\' 4"  (1.626 m)   Body mass index is 35.19 kg/m. Wt Readings from Last 3 Encounters:  09/20/23 205 lb (93 kg)  08/22/23 205 lb 3.2 oz (93.1 kg)  04/21/23 207 lb (93.9 kg)   Patient is in no distress; well developed, nourished and groomed; neck is supple  CARDIOVASCULAR: Examination of carotid arteries is normal; no carotid bruits Regular rate and rhythm, no murmurs Examination of peripheral vascular system by observation and palpation is normal  EYES: Ophthalmoscopic exam of optic discs and posterior segments is normal; no papilledema or hemorrhages No results found.  MUSCULOSKELETAL: Gait, strength, tone, movements noted in Neurologic exam below  NEUROLOGIC: MENTAL STATUS:      No data to display         awake, alert, oriented to person, place and time recent and remote memory intact normal attention and concentration language fluent, comprehension intact, naming intact fund of knowledge appropriate  CRANIAL NERVE:  2nd - no papilledema on fundoscopic exam 2nd, 3rd, 4th, 6th - pupils equal and reactive to light, visual fields full to confrontation, extraocular muscles intact, no nystagmus 5th - facial sensation symmetric 7th - facial strength symmetric 8th - hearing intact 9th - palate  elevates symmetrically, uvula  midline 11th - shoulder shrug symmetric 12th - tongue protrusion midline  MOTOR:  normal bulk and tone, full strength in the BUE, BLE  SENSORY:  normal and symmetric to light touch, pinprick, temperature, vibration; EXCEPT DECR IN HANDS AND FEET TO PP  COORDINATION:  finger-nose-finger, fine finger movements normal  REFLEXES:  deep tendon reflexes TRACE and symmetric  GAIT/STATION:  narrow based gait     DIAGNOSTIC DATA (LABS, IMAGING, TESTING) - I reviewed patient records, labs, notes, testing and imaging myself where available.  Lab Results  Component Value Date   WBC 8.9 08/10/2023   HGB 13.4 08/10/2023   HCT 40.5 08/10/2023   MCV 96 08/10/2023   PLT 271 08/10/2023      Component Value Date/Time   NA 141 08/10/2023 0942   K 4.3 08/10/2023 0942   CL 104 08/10/2023 0942   CO2 21 08/10/2023 0942   GLUCOSE 123 (H) 08/10/2023 0942   GLUCOSE 204 (H) 08/02/2021 1030   BUN 11 08/10/2023 0942   CREATININE 0.72 08/10/2023 0942   CALCIUM  9.8 08/10/2023 0942   PROT 6.8 08/10/2023 0942   ALBUMIN 4.4 08/10/2023 0942   AST 24 08/10/2023 0942   ALT 24 08/10/2023 0942   ALKPHOS 87 08/10/2023 0942   BILITOT 0.6 08/10/2023 0942   GFRNONAA >60 08/02/2021 1030   GFRAA 112 05/21/2020 0921   Lab Results  Component Value Date   CHOL 128 08/10/2023   HDL 42 08/10/2023   LDLCALC 52 08/10/2023   TRIG 211 (H) 08/10/2023   CHOLHDL 3.0 08/10/2023   Lab Results  Component Value Date   HGBA1C 7.8 (H) 08/10/2023   Lab Results  Component Value Date   VITAMINB12 1,480 (H) 08/10/2023   Lab Results  Component Value Date   TSH 3.450 03/22/2023    05/22/2022 MRI lumbar spine 1. Same numbering terminology utilized as on the previous study. L5 is a sacralized vertebra. 2. Mild degenerative endplate edema at G95-A2 and L1-2 which could relate to regional pain. This was not present previously. 3. Chronic disc degeneration at L4-5 with endplate osteophytes and tiny disc  protrusion with slight caudal down turning. No apparent change since the previous study. No apparent compressive stenosis. 4. Mild facet osteoarthritis at L3-4 and L4-5 which could contribute to back pain or referred facet syndrome pain.   05/24/2023 MRI thoracic spine 1. At T7-8 there is a small broad central disc protrusion. No foraminal or central canal stenosis. 2. No acute osseous injury of the thoracic spine.   ASSESSMENT AND PLAN  53 y.o. year old female here with:   Dx:  1. Pain in both lower extremities   2. Numbness in both legs   3. Chronic midline low back pain without sciatica      PLAN:  CHRONIC LOW BACK PAIN (likely related to muscle strain, deconditioning, weight) - refer to PT evaluation for strength, mobility training  LEG NUMBNESS / PAIN (likely due to diabetic neuropathy; also history of RLS on gabapentin ) - continue diabetes treatment - advised on foot hygiene and precautions - check iron studies panel (for RLS) - Consider painful neuropathy treatment options: -duloxetine  30-60mg  daily, amitriptyline 25-50mg  at bedtime, gabapentin  100-300mg  three times a day, pregabalin 75-150mg  twice a day -capsaicin cream, lidocaine  patch / cream, alpha-lipoic acid 600mg  daily  Orders Placed This Encounter  Procedures   Iron, TIBC and Ferritin Panel   Ambulatory referral to Physical Therapy   Return for pending if symptoms worsen or  fail to improve, pending test results.    Omega Bible, MD 09/20/2023, 10:00 AM Certified in Neurology, Neurophysiology and Neuroimaging  [facility] [address] [place], [place] [address] [phone]

## 2023-09-21 LAB — IRON,TIBC AND FERRITIN PANEL
Ferritin: 94 ng/mL (ref 15–150)
Iron Saturation: 18 % (ref 15–55)
Iron: 69 ug/dL (ref 27–159)
Total Iron Binding Capacity: 380 ug/dL (ref 250–450)
UIBC: 311 ug/dL (ref 131–425)

## 2023-09-27 ENCOUNTER — Ambulatory Visit: Attending: Diagnostic Neuroimaging | Admitting: Physical Therapy

## 2023-09-27 ENCOUNTER — Other Ambulatory Visit: Payer: Self-pay

## 2023-09-27 ENCOUNTER — Encounter: Payer: Self-pay | Admitting: Physical Therapy

## 2023-09-27 DIAGNOSIS — M79604 Pain in right leg: Secondary | ICD-10-CM | POA: Insufficient documentation

## 2023-09-27 DIAGNOSIS — R200 Anesthesia of skin: Secondary | ICD-10-CM | POA: Insufficient documentation

## 2023-09-27 DIAGNOSIS — M79605 Pain in left leg: Secondary | ICD-10-CM | POA: Diagnosis not present

## 2023-09-27 DIAGNOSIS — G8929 Other chronic pain: Secondary | ICD-10-CM | POA: Insufficient documentation

## 2023-09-27 DIAGNOSIS — M5459 Other low back pain: Secondary | ICD-10-CM | POA: Diagnosis not present

## 2023-09-27 DIAGNOSIS — M545 Low back pain, unspecified: Secondary | ICD-10-CM | POA: Diagnosis not present

## 2023-09-27 NOTE — Therapy (Signed)
 OUTPATIENT PHYSICAL THERAPY THORACOLUMBAR EVALUATION   Patient Name: Sherry Glass MRN: [id number] DOB:[birth year], 53 y.o., female Today's Date: 09/27/2023  END OF SESSION:  PT End of Session - 09/27/23 1143     Visit Number 1    Number of Visits 8    Date for PT Re-Evaluation 10/25/23    PT Start Time 1015    PT Stop Time 1044    PT Time Calculation (min) 29 min    Activity Tolerance Patient tolerated treatment well    Behavior During Therapy WFL for tasks assessed/performed             Past Medical History:  Diagnosis Date   Allergic rhinitis    Anxiety and depression    Asthma    pt states this doesn't really bother her anymore   Cataract    Chest pain syndrome    COPD (chronic obstructive pulmonary disease) (HCC)    Depression    Diabetes mellitus without complication (HCC)    History of kidney stones    HTN (hypertension)    Hypercholesterolemia    Hyperglycemia    Hypertension    Hypothyroidism    N&V (nausea and vomiting) 01/08/2021   Obesity    Restless leg syndrome    Tobacco abuse    Past Surgical History:  Procedure Laterality Date   CARDIAC CATHETERIZATION  2014   CATARACT EXTRACTION Right 2023   CATARACT EXTRACTION W/PHACO Left 06/23/2016   Procedure: CATARACT EXTRACTION PHACO AND INTRAOCULAR LENS PLACEMENT (IOC);  Surgeon: Anner Kill, MD;  Location: [facility];  Service: Ophthalmology;  Laterality: Left;  left - pt knows to arrive at 10:50 cde 9.11   CESAREAN SECTION     CHOLECYSTECTOMY     COLONOSCOPY WITH PROPOFOL  N/A 10/14/2019   Procedure: COLONOSCOPY WITH PROPOFOL ;  Surgeon: Suzette Espy, MD;  [facility]: [facility];  Service: Endoscopy;  Laterality: N/A;  8:00   ESOPHAGOGASTRODUODENOSCOPY (EGD) WITH PROPOFOL  N/A 02/01/2021   Surgeon: Suzette Espy, MD; normal exam   EXCISION MASS NECK     cyst   left foot surgery     removal of cyst   POLYPECTOMY  10/14/2019   Procedure: POLYPECTOMY;  Surgeon: Suzette Espy, MD;  Location: [facility];  Service: Endoscopy;;   RADICAL ABDOMINAL HYSTERECTOMY     SHOULDER ARTHROSCOPY Left 08/05/2021   Procedure: left shoulder arthroscopy, manipulation under anesthesia, rotator interval release;  Surgeon: Jasmine Mesi, MD;  Location: [facility] OR;  Service: Orthopedics;  Laterality: Left;   THYROID  SURGERY     Dr. Larrie Po   TONSILLECTOMY     Patient Active Problem List   Diagnosis Date Noted   Upper airway cough syndrome 04/22/2023   DOE (dyspnea on exertion) 04/21/2023   Arthropathy of lumbar facet joint 04/20/2023   Chronic bilateral low back pain with sciatica 09/22/2022   Adrenal mass (HCC) 11/11/2021   Adhesive capsulitis of left shoulder    Spinal stenosis of lumbar region 07/24/2020   Lumbar radiculitis 11/21/2019   Lumbar spondylosis 11/21/2019   Lumbar stenosis with neurogenic claudication 11/21/2019   Hepatic steatosis 05/23/2019   Psoriasis 05/09/2019   Depression, major, single episode, mild (HCC) 10/12/2018   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   Nocturnal hypoxemia 05/29/2017   GAD (generalized anxiety disorder) 02/13/2017   Restrictive lung disease 01/27/2017   Lung nodule 01/27/2017   GERD (gastroesophageal reflux disease) 11/18/2016   Snoring 11/18/2016   Witnessed episode of apnea 11/18/2016   Chronic  seasonal allergic rhinitis 11/18/2016   Restless leg syndrome 11/18/2016   Fibromyalgia 11/18/2016   Hyperlipidemia associated with type 2 diabetes mellitus (HCC) 06/09/2016   Vitamin D  deficiency 05/16/2016   Morbid obesity (HCC) 05/13/2016   Diabetes mellitus (HCC) 05/13/2016   COPD (chronic obstructive pulmonary disease) (HCC) 05/13/2016   Chest pain syndrome    Hypertension associated with diabetes (HCC)    Stopped smoking with greater than 25 pack year history    REFERRING PROVIDER: Gwendloyn Lemming  REFERRING DIAG: Chronic midline LBP without sciatica.  Rationale for Evaluation and Treatment: Rehabilitation  THERAPY  DIAG:  Other low back pain  ONSET DATE: ~4 years  SUBJECTIVE:                                                                                                                                                                                           SUBJECTIVE STATEMENT: The patient presents to the clinic with c/o low back pain that has been ongoing for about 4 years with no known mechanism of injury.  Her pain is rated at a 5-6/10 today and can rise to higher levels with prolonged standing, walking and lifting.  She has occasions of pain into her LE's.  She describes her pain as an ache, throbbing, sharp, numb and shooting.    PERTINENT HISTORY:  Please see above.  PAIN:  Are you having pain? Yes: NPRS scale: 5-6/10 Pain location: LB Pain description: As above. Aggravating factors: As above. Relieving factors: Sitting   PRECAUTIONS: None  RED FLAGS: None   WEIGHT BEARING RESTRICTIONS: No  FALLS:  Has patient fallen in last 6 months? Yes. Number of falls 2.  Tripped.  LIVING ENVIRONMENT: Lives in: House/apartment Has following equipment at home: None   PLOF: Independent  PATIENT GOALS: Do more with less pain.    OBJECTIVE:  Note: Objective measures were completed at Evaluation unless otherwise noted.  DIAGNOSTIC FINDINGS:  05/22/2022 MRI lumbar spine 1. Same numbering terminology utilized as on the previous study. L5 is a sacralized vertebra. 2. Mild degenerative endplate edema at Z61-W9 and L1-2 which could relate to regional pain. This was not present previously. 3. Chronic disc degeneration at L4-5 with endplate osteophytes and tiny disc protrusion with slight caudal down turning. No apparent change since the previous study. No apparent compressive stenosis. 4. Mild facet osteoarthritis at L3-4 and L4-5 which could contribute to back pain or referred facet syndrome pain.  PATIENT SURVEYS:  Modified Oswestry 13/50.       POSTURE: No Significant postural  limitations  PALPATION: Tender to palpation with overpressure at L4 to S1.  LUMBAR ROM:  Active lumbar flexion is decreased by 25% and extension to 20 degrees.  LOWER EXTREMITY ROM:     WNL.  LOWER EXTREMITY MMT:    Normal bilateral LE strength.  LUMBAR SPECIAL TESTS:  Equal leg lengths. Mild pain increase with a right SLR test. (-) FABER testing.  Right patellar reflex less brisk that left.  Achilles normal bilaterally.   GAIT: Essentially normal.  TREATMENT DATE:                                                                                                                                  PATIENT EDUCATION:  Education details:  Person educated:  International aid/development worker:  Education comprehension:   HOME EXERCISE PROGRAM:   ASSESSMENT:  CLINICAL IMPRESSION: The patient presents to [facility] with chronic low back pain over the last 4 years.  Prolonged standing, walking and lifting increase her pain to high levels.  She is tender to palpation with overpressure at L4 to S1.  She exhibits normal LE strength.  Her pain prohibits her from performing ADL's like she wants to.  Her Modified Owestry score is 13/50. Patient may benefit from skilled physical therapy intervention to address pain and deficits.   OBJECTIVE IMPAIRMENTS: decreased activity tolerance and pain.   ACTIVITY LIMITATIONS: carrying, lifting, bending, standing, and locomotion level  PARTICIPATION LIMITATIONS: meal prep, cleaning, laundry, shopping, community activity, and yard work  PERSONAL FACTORS: Time since onset of injury/illness/exacerbation are also affecting patient's functional outcome.   REHAB POTENTIAL: Fair plus/Good minus  CLINICAL DECISION MAKING: Evolving/moderate complexity  EVALUATION COMPLEXITY: Moderate   GOALS:  SHORT TERM GOALS: Target date: 10/11/23.  Ind with an initial HEP. Goal status: INITIAL  LONG TERM GOALS: Target date: 10/25/23  Ind with an advanced HEP.  Goal status:  INITIAL  2.  Stand 20 minutes with pain not > 3-4/10.  Goal status: INITIAL  3.  Patient walk a community distance with pain not > 3-4/10.  Goal status: INITIAL  4.  Perform ADL's with pain not > 4/10. Goal status: INITIAL   PLAN:  PT FREQUENCY: 2x/week  PT DURATION: 4 weeks  PLANNED INTERVENTIONS: 97110-Therapeutic exercises, 97530- Therapeutic activity, [id number]- Neuromuscular re-education, 97535- Self Care, and [id number]- Manual therapy.  PLAN FOR NEXT SESSION: Core exercise progression.   Everli Rother, [place], PT 09/27/2023, 12:07 PM

## 2023-10-04 ENCOUNTER — Ambulatory Visit: Payer: Self-pay | Admitting: Diagnostic Neuroimaging

## 2023-10-05 ENCOUNTER — Ambulatory Visit: Attending: Diagnostic Neuroimaging | Admitting: *Deleted

## 2023-10-05 ENCOUNTER — Encounter: Payer: Self-pay | Admitting: *Deleted

## 2023-10-05 DIAGNOSIS — M5459 Other low back pain: Secondary | ICD-10-CM | POA: Insufficient documentation

## 2023-10-05 NOTE — Therapy (Addendum)
 OUTPATIENT PHYSICAL THERAPY THORACOLUMBAR EVALUATION   Patient Name: Sherry Glass MRN: [id number] DOB:Sep 30, 1970, 53 y.o., female Today's Date: 10/05/2023  END OF SESSION:  PT End of Session - 10/05/23 0939     Visit Number 2    Number of Visits 8    Date for PT Re-Evaluation 10/25/23    PT Start Time 0930    PT Stop Time 1020    PT Time Calculation (min) 50 min             Past Medical History:  Diagnosis Date   Allergic rhinitis    Anxiety and depression    Asthma    pt states this doesn't really bother her anymore   Cataract    Chest pain syndrome    COPD (chronic obstructive pulmonary disease) (HCC)    Depression    Diabetes mellitus without complication (HCC)    History of kidney stones    HTN (hypertension)    Hypercholesterolemia    Hyperglycemia    Hypertension    Hypothyroidism    N&V (nausea and vomiting) 01/08/2021   Obesity    Restless leg syndrome    Tobacco abuse    Past Surgical History:  Procedure Laterality Date   CARDIAC CATHETERIZATION  2014   CATARACT EXTRACTION Right 2023   CATARACT EXTRACTION W/PHACO Left 06/23/2016   Procedure: CATARACT EXTRACTION PHACO AND INTRAOCULAR LENS PLACEMENT (IOC);  Surgeon: Cherene Mania, MD;  Location: [facility];  Service: Ophthalmology;  Laterality: Left;  left - pt knows to arrive at 10:50 cde 9.11   CESAREAN SECTION     CHOLECYSTECTOMY     COLONOSCOPY WITH PROPOFOL  N/A 10/14/2019   Procedure: COLONOSCOPY WITH PROPOFOL ;  Surgeon: Shaaron Lamar CHRISTELLA, MD;  Location: [facility];  Service: Endoscopy;  Laterality: N/A;  8:00   ESOPHAGOGASTRODUODENOSCOPY (EGD) WITH PROPOFOL  N/A 02/01/2021   Surgeon: Shaaron Lamar CHRISTELLA, MD; normal exam   EXCISION MASS NECK     cyst   left foot surgery     removal of cyst   POLYPECTOMY  10/14/2019   Procedure: POLYPECTOMY;  Surgeon: Shaaron Lamar CHRISTELLA, MD;  Location: [facility];  Service: Endoscopy;;   RADICAL ABDOMINAL HYSTERECTOMY     SHOULDER ARTHROSCOPY Left 08/05/2021    Procedure: left shoulder arthroscopy, manipulation under anesthesia, rotator interval release;  Surgeon: Addie Cordella Hamilton, MD;  Location: [facility];  Service: Orthopedics;  Laterality: Left;   THYROID  SURGERY     Dr. Mavis   TONSILLECTOMY     Patient Active Problem List   Diagnosis Date Noted   Upper airway cough syndrome 04/22/2023   DOE (dyspnea on exertion) 04/21/2023   Arthropathy of lumbar facet joint 04/20/2023   Chronic bilateral low back pain with sciatica 09/22/2022   Adrenal mass (HCC) 11/11/2021   Adhesive capsulitis of left shoulder    Spinal stenosis of lumbar region 07/24/2020   Lumbar radiculitis 11/21/2019   Lumbar spondylosis 11/21/2019   Lumbar stenosis with neurogenic claudication 11/21/2019   Hepatic steatosis 05/23/2019   Psoriasis 05/09/2019   Depression, major, single episode, mild (HCC) 10/12/2018   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   Nocturnal hypoxemia 05/29/2017   GAD (generalized anxiety disorder) 02/13/2017   Restrictive lung disease 01/27/2017   Lung nodule 01/27/2017   GERD (gastroesophageal reflux disease) 11/18/2016   Snoring 11/18/2016   Witnessed episode of apnea 11/18/2016   Chronic seasonal allergic rhinitis 11/18/2016   Restless leg syndrome 11/18/2016   Fibromyalgia 11/18/2016   Hyperlipidemia associated with  type 2 diabetes mellitus (HCC) 06/09/2016   Vitamin D  deficiency 05/16/2016   Morbid obesity (HCC) 05/13/2016   Diabetes mellitus (HCC) 05/13/2016   COPD (chronic obstructive pulmonary disease) (HCC) 05/13/2016   Chest pain syndrome    Hypertension associated with diabetes (HCC)    Stopped smoking with greater than 25 pack year history    REFERRING PROVIDER: Eduard Hanlon  REFERRING DIAG: Chronic midline LBP without sciatica.  Rationale for Evaluation and Treatment: Rehabilitation  THERAPY DIAG:  Other low back pain  ONSET DATE: ~4 years  SUBJECTIVE:                                                                                                                                                                                            SUBJECTIVE STATEMENT: The patient presents to the clinic with c/o low back pain that has been ongoing for about 4 years. Pain daily with ADL's    PERTINENT HISTORY:  Please see above.  PAIN:  Are you having pain? Yes: NPRS scale: 5-6/10 Pain location: LB Pain description: As above. Aggravating factors: As above. Relieving factors: Sitting   PRECAUTIONS: None  RED FLAGS: None   WEIGHT BEARING RESTRICTIONS: No  FALLS:  Has patient fallen in last 6 months? Yes. Number of falls 2.  Tripped.  LIVING ENVIRONMENT: Lives in: House/apartment Has following equipment at home: None   PLOF: Independent  PATIENT GOALS: Do more with less pain.    OBJECTIVE:  Note: Objective measures were completed at Evaluation unless otherwise noted.  DIAGNOSTIC FINDINGS:  05/22/2022 MRI lumbar spine 1. Same numbering terminology utilized as on the previous study. L5 is a sacralized vertebra. 2. Mild degenerative endplate edema at U87-O8 and L1-2 which could relate to regional pain. This was not present previously. 3. Chronic disc degeneration at L4-5 with endplate osteophytes and tiny disc protrusion with slight caudal down turning. No apparent change since the previous study. No apparent compressive stenosis. 4. Mild facet osteoarthritis at L3-4 and L4-5 which could contribute to back pain or referred facet syndrome pain.  PATIENT SURVEYS:  Modified Oswestry 13/50.       POSTURE: No Significant postural limitations  PALPATION: Tender to palpation with overpressure at L4 to S1.  LUMBAR ROM:   Active lumbar flexion is decreased by 25% and extension to 20 degrees.  LOWER EXTREMITY ROM:     WNL.  LOWER EXTREMITY MMT:    Normal bilateral LE strength.  LUMBAR SPECIAL TESTS:  Equal leg lengths. Mild pain increase with a right SLR test. (-)  FABER testing.  Right patellar reflex less brisk that left.  Achilles normal bilaterally.  GAIT: Essentially normal.  TREATMENT DATE:    10-05-23                                                                                                                            Discussed and practiced  AB bracing during transitional movements as well as modifying movement patterns to decrease pain triggers with ADL's. Discussed walking program 2 x daily and to time walks  AB bracing x 10 hold 5 secs  SOC pelvic tilts x 10 and find neutral position  Manual STW to bil LB paras in LT side lying with pillows B/W LEs     PATIENT EDUCATION:  Education details:  Person educated:  International aid/development worker:  Education comprehension:   HOME EXERCISE PROGRAM:   ASSESSMENT:  CLINICAL IMPRESSION: The patient arrived today doing fair with LBP and LE numbness. Rx focused on AB bracing during transitional movements as well as modifying movement patterns to decrease pain triggers with ADL's. AB bracing and SOC pelvic tilts given for HEP. STW to Bil LB paras tolerated well, but notable tightness noted.   OBJECTIVE IMPAIRMENTS: decreased activity tolerance and pain.   ACTIVITY LIMITATIONS: carrying, lifting, bending, standing, and locomotion level  PARTICIPATION LIMITATIONS: meal prep, cleaning, laundry, shopping, community activity, and yard work  PERSONAL FACTORS: Time since onset of injury/illness/exacerbation are also affecting patient's functional outcome.   REHAB POTENTIAL: Fair plus/Good minus  CLINICAL DECISION MAKING: Evolving/moderate complexity  EVALUATION COMPLEXITY: Moderate   GOALS:  SHORT TERM GOALS: Target date: 10/11/23.  Ind with an initial HEP. Goal status: INITIAL  LONG TERM GOALS: Target date: 10/25/23  Ind with an advanced HEP.  Goal status: INITIAL  2.  Stand 20 minutes with pain not > 3-4/10.  Goal status: INITIAL  3.  Patient walk a community distance with pain not >  3-4/10.  Goal status: INITIAL  4.  Perform ADL's with pain not > 4/10. Goal status: INITIAL   PLAN:  PT FREQUENCY: 2x/week  PT DURATION: 4 weeks  PLANNED INTERVENTIONS: 97110-Therapeutic exercises, 97530- Therapeutic activity, [id number]- Neuromuscular re-education, 97535- Self Care, and 02859- Manual therapy.  PLAN FOR NEXT SESSION: Core exercise progression.   Mckinzi Eriksen,CHRIS, PTA 10/05/2023, 5:59 PM   .o

## 2023-10-13 ENCOUNTER — Encounter: Admitting: *Deleted

## 2023-10-30 ENCOUNTER — Ambulatory Visit: Admitting: Family

## 2023-10-30 ENCOUNTER — Encounter: Payer: Self-pay | Admitting: Family

## 2023-10-30 VITALS — BP 130/61 | HR 80 | Temp 97.0°F | Ht 64.0 in | Wt 198.2 lb

## 2023-10-30 DIAGNOSIS — F320 Major depressive disorder, single episode, mild: Secondary | ICD-10-CM

## 2023-10-30 DIAGNOSIS — I152 Hypertension secondary to endocrine disorders: Secondary | ICD-10-CM | POA: Diagnosis not present

## 2023-10-30 DIAGNOSIS — M48062 Spinal stenosis, lumbar region with neurogenic claudication: Secondary | ICD-10-CM | POA: Diagnosis not present

## 2023-10-30 DIAGNOSIS — M5442 Lumbago with sciatica, left side: Secondary | ICD-10-CM

## 2023-10-30 DIAGNOSIS — Z7985 Long-term (current) use of injectable non-insulin antidiabetic drugs: Secondary | ICD-10-CM

## 2023-10-30 DIAGNOSIS — R0609 Other forms of dyspnea: Secondary | ICD-10-CM

## 2023-10-30 DIAGNOSIS — F411 Generalized anxiety disorder: Secondary | ICD-10-CM | POA: Diagnosis not present

## 2023-10-30 DIAGNOSIS — E785 Hyperlipidemia, unspecified: Secondary | ICD-10-CM | POA: Diagnosis not present

## 2023-10-30 DIAGNOSIS — J441 Chronic obstructive pulmonary disease with (acute) exacerbation: Secondary | ICD-10-CM

## 2023-10-30 DIAGNOSIS — E890 Postprocedural hypothyroidism: Secondary | ICD-10-CM | POA: Diagnosis not present

## 2023-10-30 DIAGNOSIS — E1159 Type 2 diabetes mellitus with other circulatory complications: Secondary | ICD-10-CM

## 2023-10-30 DIAGNOSIS — M25552 Pain in left hip: Secondary | ICD-10-CM | POA: Diagnosis not present

## 2023-10-30 DIAGNOSIS — K219 Gastro-esophageal reflux disease without esophagitis: Secondary | ICD-10-CM

## 2023-10-30 DIAGNOSIS — G8929 Other chronic pain: Secondary | ICD-10-CM | POA: Diagnosis not present

## 2023-10-30 DIAGNOSIS — E1169 Type 2 diabetes mellitus with other specified complication: Secondary | ICD-10-CM

## 2023-10-30 DIAGNOSIS — M5441 Lumbago with sciatica, right side: Secondary | ICD-10-CM | POA: Diagnosis not present

## 2023-10-30 LAB — BAYER DCA HB A1C WAIVED: HB A1C (BAYER DCA - WAIVED): 6.3 % — ABNORMAL HIGH (ref 4.8–5.6)

## 2023-10-30 MED ORDER — LOSARTAN POTASSIUM 100 MG PO TABS: 100.0000 mg | ORAL_TABLET | Freq: Every day | ORAL | 1 refills | Status: DC

## 2023-10-30 MED ORDER — IBUPROFEN 800 MG PO TABS: 800.0000 mg | ORAL_TABLET | Freq: Two times a day (BID) | ORAL | 0 refills | Status: DC

## 2023-10-30 MED ORDER — ROSUVASTATIN CALCIUM 10 MG PO TABS: 10.0000 mg | ORAL_TABLET | Freq: Every day | ORAL | 0 refills | Status: DC

## 2023-10-30 MED ORDER — SEMAGLUTIDE(0.25 OR 0.5MG/DOS) 2 MG/3ML ~~LOC~~ SOPN
0.5000 mg | PEN_INJECTOR | SUBCUTANEOUS | 1 refills | Status: DC
Start: 2023-10-30 — End: 2023-11-23

## 2023-10-30 MED ORDER — GABAPENTIN 100 MG PO CAPS: 100.0000 mg | ORAL_CAPSULE | Freq: Three times a day (TID) | ORAL | 2 refills | Status: DC

## 2023-10-30 MED ORDER — CYCLOBENZAPRINE HCL 10 MG PO TABS: 10.0000 mg | ORAL_TABLET | Freq: Three times a day (TID) | ORAL | 0 refills | Status: DC | PRN

## 2023-10-30 NOTE — Progress Notes (Signed)
 Subjective:    Patient ID: Sherry Glass, female    DOB: [birth year], 53 y.o.   MRN: [id number]  Chief Complaint  Patient presents with   Medical Management of Chronic Issues   Pt presents to the office today for chronic follow up.    Pt has seen Pulmonologist for COPD, Restrictive lung disease, and SOB, but has been cleared. She quit smoking 2018.  She had thyroid  ablation.   She is followed by [facility] as needed for chronic back pain. Had a MRI 05/22/22.    She is morbid obese with a BMI of 34 and DM and HTN. She was started on Ozempic  0.25 mg. Her starting weight was 205 lb. She was on Trulicity  prior.      10/30/2023   12:32 PM 09/20/2023    9:11 AM 08/22/2023   11:33 AM  Last 3 Weights  Weight (lbs) 198 lb 3.2 oz 205 lb 205 lb 3.2 oz  Weight (kg) 89.903 kg 92.987 kg 93.078 kg       COPD She complains of hoarse voice. There is no wheezing. This is a chronic problem. The current episode started more than 1 year ago. The problem occurs intermittently. Associated symptoms include heartburn. Her symptoms are alleviated by rest. She reports moderate improvement on treatment. Her past medical history is significant for COPD.  Diabetes She presents for her follow-up diabetic visit. She has type 2 diabetes mellitus. Hypoglycemia symptoms include nervousness/anxiousness. Symptoms are stable. Risk factors for coronary artery disease include diabetes mellitus, dyslipidemia, hypertension and sedentary lifestyle. She is following a generally healthy diet. Her overall blood glucose range is 110-130 mg/dl. Eye exam is current.  Hypertension This is a chronic problem. The current episode started more than 1 year ago. The problem has been resolved since onset. The problem is controlled. Associated symptoms include anxiety. Pertinent negatives include no peripheral edema. Risk factors for coronary artery disease include dyslipidemia, diabetes mellitus and sedentary lifestyle. The current  treatment provides moderate improvement.  Gastroesophageal Reflux She complains of belching, heartburn and a hoarse voice. She reports no wheezing. This is a chronic problem. The current episode started more than 1 year ago. The problem occurs rarely. The symptoms are aggravated by certain foods. Risk factors include obesity. She has tried a PPI for the symptoms. The treatment provided moderate relief.  Hyperlipidemia This is a chronic problem. The current episode started more than 1 year ago. The problem is controlled. Recent lipid tests were reviewed and are normal. Exacerbating diseases include obesity. Current antihyperlipidemic treatment includes statins. The current treatment provides moderate improvement of lipids. Risk factors for coronary artery disease include dyslipidemia, diabetes mellitus, hypertension, a sedentary lifestyle and post-menopausal.  Back Pain This is a chronic problem. The current episode started more than 1 year ago. The problem occurs intermittently. The problem has been waxing and waning since onset. The pain is present in the lumbar spine. The quality of the pain is described as aching. The pain is at a severity of 7/10. The pain is moderate. Risk factors include obesity. She has tried bed rest and NSAIDs for the symptoms. The treatment provided moderate relief.  Anxiety Presents for follow-up visit. Symptoms include depressed mood, excessive worry and nervous/anxious behavior. Patient reports no decreased concentration or suicidal ideas. Symptoms occur most days. The severity of symptoms is moderate.    Depression        This is a chronic problem.  The current episode started more than 1 year ago.  The problem occurs intermittently.  Associated symptoms include no decreased concentration, no helplessness, no hopelessness, not sad and no suicidal ideas.  Past treatments include SNRIs - Serotonin and norepinephrine reuptake inhibitors.  Past medical history includes  anxiety.       Review of Systems  HENT:  Positive for hoarse voice.   Respiratory:  Negative for wheezing.   Gastrointestinal:  Positive for heartburn.  Musculoskeletal:  Positive for back pain.  Psychiatric/Behavioral:  Negative for decreased concentration and suicidal ideas. The patient is nervous/anxious.   All other systems reviewed and are negative.  Family History  Adopted: Yes  Problem Relation Age of Onset   Breast cancer Neg Hx    Social History   Socioeconomic History   Marital status: Widowed    Spouse name: Not on file   Number of children: 4   Years of education: Not on file   Highest education level: 12th grade  Occupational History   Not on file  Tobacco Use   Smoking status: Former    Current packs/day: 0.00    Average packs/day: 1 pack/day for 20.0 years (20.0 ttl pk-yrs)    Types: Cigarettes    Start date: 11/13/1996    Quit date: 10/28/2016    Years since quitting: 7.0   Smokeless tobacco: Never  Vaping Use   Vaping status: Never Used  Substance and Sexual Activity   Alcohol use: Not Currently    Comment: For special occasions, very rare    Drug use: No   Sexual activity: Never    Birth control/protection: Surgical  Other Topics Concern   Not on file  Social History Narrative   [facility] (11/18/16):   Originally from [place] but grew up in [place]. Cared for her husband until he passed. 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 Does have significant dust exposure. Has 3 dogs currently. Previously had a parakeet as a child. No mold exposure. Enjoys playing with her grandchildren.        Social Drivers of Corporate investment banker Strain: Low Risk  (08/22/2023)   Overall Financial Resource Strain (CARDIA)    Difficulty of Paying Living Expenses: Not very hard  Food Insecurity: Food Insecurity Present (08/22/2023)   Hunger Vital Sign    Worried About Running Out of Food in the Last Year: Sometimes  true    Ran Out of Food in the Last Year: Sometimes true  Transportation Needs: No Transportation Needs (08/22/2023)   PRAPARE - Administrator, Civil Service (Medical): No    Lack of Transportation (Non-Medical): No  Physical Activity: Insufficiently Active (08/22/2023)   Exercise Vital Sign    Days of Exercise per Week: 4 days    Minutes of Exercise per Session: 30 min  Stress: Stress Concern Present (08/22/2023)   [employer] of Occupational Health - Occupational Stress Questionnaire    Feeling of Stress : Rather much  Social Connections: Socially Isolated (08/22/2023)   Social Connection and Isolation Panel    Frequency of Communication with Friends and Family: More than three times a week    Frequency of Social Gatherings with Friends and Family: More than three times a week    Attends Religious Services: Never    Database administrator or Organizations: No    Attends Banker Meetings: Not on file    Marital Status: Widowed       Objective:   Physical Exam Vitals reviewed.  Constitutional:  General: She is not in acute distress.    Appearance: She is well-developed. She is obese.  HENT:     Head: Normocephalic and atraumatic.     Right Ear: Tympanic membrane normal.     Left Ear: Tympanic membrane normal.   Eyes:     Pupils: Pupils are equal, round, and reactive to light.   Neck:     Thyroid : No thyromegaly.   Cardiovascular:     Rate and Rhythm: Normal rate and regular rhythm.     Heart sounds: Normal heart sounds. No murmur heard. Pulmonary:     Effort: Pulmonary effort is normal. No respiratory distress.     Breath sounds: Wheezing present.  Abdominal:     General: Bowel sounds are normal. There is no distension.     Palpations: Abdomen is soft.     Tenderness: There is no abdominal tenderness.   Musculoskeletal:        General: No tenderness. Normal range of motion.     Cervical back: Normal range of motion and neck supple.    Skin:    General: Skin is warm and dry.   Neurological:     Mental Status: She is alert and oriented to person, place, and time.     Cranial Nerves: No cranial nerve deficit.     Deep Tendon Reflexes: Reflexes are normal and symmetric.   Psychiatric:        Mood and Affect: Mood is anxious.        Behavior: Behavior normal.        Thought Content: Thought content normal.        Judgment: Judgment normal.      BP (!) 161/79   Pulse 80   Temp (!) 97 F (36.1 C) (Temporal)   Ht 5' 4 (1.626 m)   Wt 198 lb 3.2 oz (89.9 kg)   SpO2 95%   BMI 34.02 kg/m      Assessment & Plan:  Sherry Glass comes in today with chief complaint of Medical Management of Chronic Issues   Diagnosis and orders addressed:  1. Chronic bilateral low back pain with sciatica, sciatica laterality unspecified - cyclobenzaprine  (FLEXERIL ) 10 MG tablet; Take 1 tablet (10 mg total) by mouth 3 (three) times daily as needed. for muscle spams  Dispense: 90 tablet; Refill: 0 - ibuprofen  (ADVIL ) 800 MG tablet; Take 1 tablet (800 mg total) by mouth 2 (two) times daily.  Dispense: 180 tablet; Refill: 0 - CMP14+EGFR  2. DOE (dyspnea on exertion) - gabapentin  (NEURONTIN ) 100 MG capsule; Take 1 capsule (100 mg total) by mouth 3 (three) times daily.  Dispense: 90 capsule; Refill: 2 - CMP14+EGFR  3. Left hip pain - ibuprofen  (ADVIL ) 800 MG tablet; Take 1 tablet (800 mg total) by mouth 2 (two) times daily.  Dispense: 180 tablet; Refill: 0 - CMP14+EGFR  4. Hypertension associated with diabetes (HCC) - losartan  (COZAAR ) 100 MG tablet; Take 1 tablet (100 mg total) by mouth daily.  Dispense: 90 tablet; Refill: 1 - CMP14+EGFR  5. Hyperlipidemia associated with type 2 diabetes mellitus (HCC) - rosuvastatin  (CRESTOR ) 10 MG tablet; Take 1 tablet (10 mg total) by mouth daily.  Dispense: 90 tablet; Refill: 0 - CMP14+EGFR  6. Morbid obesity (HCC) - CMP14+EGFR  7. Type 2 diabetes mellitus with other specified  complication, without long-term current use of insulin  (HCC) (Primary) - Microalbumin / creatinine urine ratio - Bayer DCA Hb A1c Waived - CMP14+EGFR - Semaglutide ,0.25 or 0.5MG /DOS, 2 MG/3ML SOPN;  Inject 0.5 mg into the skin once a week.  Dispense: 3 mL; Refill: 1  8. Chronic obstructive pulmonary disease with acute exacerbation (HCC) - CMP14+EGFR  9. Spinal stenosis of lumbar region with neurogenic claudication  - CMP14+EGFR  10. Depression, major, single episode, mild (HCC) - CMP14+EGFR  11. Hypothyroidism following radioiodine therapy - CMP14+EGFR  12. GAD (generalized anxiety disorder)  - CMP14+EGFR  13. Gastroesophageal reflux disease, unspecified whether esophagitis present - CMP14+EGFR    Labs  pending  Will increase Ozempic  to 0.5 mg from 0.25 mg  Continue current medications Stress management   Health Maintenance reviewed Diet and exercise encouraged  Follow up plan: 3 months    Bari Learn, [name]

## 2023-10-30 NOTE — Patient Instructions (Signed)

## 2023-10-31 ENCOUNTER — Ambulatory Visit: Payer: Self-pay | Admitting: Family

## 2023-10-31 LAB — CMP14+EGFR
ALT: 23 IU/L (ref 0–32)
AST: 19 IU/L (ref 0–40)
Albumin: 4.2 g/dL (ref 3.8–4.9)
Alkaline Phosphatase: 85 IU/L (ref 44–121)
BUN/Creatinine Ratio: 11 (ref 9–23)
BUN: 8 mg/dL (ref 6–24)
Bilirubin Total: 0.8 mg/dL (ref 0.0–1.2)
CO2: 20 mmol/L (ref 20–29)
Calcium: 9.5 mg/dL (ref 8.7–10.2)
Chloride: 106 mmol/L (ref 96–106)
Creatinine, Ser: 0.72 mg/dL (ref 0.57–1.00)
Globulin, Total: 2.5 g/dL (ref 1.5–4.5)
Glucose: 99 mg/dL (ref 70–99)
Potassium: 4.1 mmol/L (ref 3.5–5.2)
Sodium: 143 mmol/L (ref 134–144)
Total Protein: 6.7 g/dL (ref 6.0–8.5)
eGFR: 99 mL/min/{1.73_m2} (ref >59)

## 2023-10-31 LAB — MICROALBUMIN / CREATININE URINE RATIO
Creatinine, Urine: 205.6 mg/dL
Microalb/Creat Ratio: 21 mg/g{creat} (ref 0–29)
Microalbumin, Urine: 42.2 ug/mL

## 2023-11-23 ENCOUNTER — Other Ambulatory Visit: Payer: Self-pay | Admitting: Family

## 2023-11-23 DIAGNOSIS — E1159 Type 2 diabetes mellitus with other circulatory complications: Secondary | ICD-10-CM

## 2023-11-23 DIAGNOSIS — F320 Major depressive disorder, single episode, mild: Secondary | ICD-10-CM

## 2023-11-23 DIAGNOSIS — R0609 Other forms of dyspnea: Secondary | ICD-10-CM

## 2023-11-23 DIAGNOSIS — F411 Generalized anxiety disorder: Secondary | ICD-10-CM

## 2023-11-23 DIAGNOSIS — E1165 Type 2 diabetes mellitus with hyperglycemia: Secondary | ICD-10-CM

## 2023-11-23 DIAGNOSIS — M25552 Pain in left hip: Secondary | ICD-10-CM

## 2023-11-23 DIAGNOSIS — G8929 Other chronic pain: Secondary | ICD-10-CM

## 2023-11-23 DIAGNOSIS — E1169 Type 2 diabetes mellitus with other specified complication: Secondary | ICD-10-CM

## 2023-11-23 MED ORDER — IBUPROFEN 800 MG PO TABS: 800.0000 mg | ORAL_TABLET | Freq: Two times a day (BID) | ORAL | 0 refills | Status: DC

## 2023-11-23 MED ORDER — GLIMEPIRIDE 2 MG PO TABS: 2.0000 mg | ORAL_TABLET | Freq: Every day | ORAL | 0 refills | Status: DC

## 2023-11-23 MED ORDER — LOSARTAN POTASSIUM 100 MG PO TABS: 100.0000 mg | ORAL_TABLET | Freq: Every day | ORAL | 0 refills | Status: DC

## 2023-11-23 MED ORDER — SEMAGLUTIDE(0.25 OR 0.5MG/DOS) 2 MG/3ML ~~LOC~~ SOPN
0.5000 mg | PEN_INJECTOR | SUBCUTANEOUS | 1 refills | Status: DC
Start: 2023-11-23 — End: 2024-02-27

## 2023-11-23 MED ORDER — DESVENLAFAXINE SUCCINATE ER 100 MG PO TB24: 100.0000 mg | ORAL_TABLET | Freq: Every day | ORAL | 0 refills | Status: DC

## 2023-11-23 MED ORDER — GABAPENTIN 100 MG PO CAPS: 100.0000 mg | ORAL_CAPSULE | Freq: Three times a day (TID) | ORAL | 1 refills | Status: DC

## 2023-11-23 MED ORDER — BUSPIRONE HCL 5 MG PO TABS: 5.0000 mg | ORAL_TABLET | Freq: Three times a day (TID) | ORAL | 1 refills | Status: AC | PRN

## 2023-11-23 MED ORDER — ROSUVASTATIN CALCIUM 10 MG PO TABS: 10.0000 mg | ORAL_TABLET | Freq: Every day | ORAL | 0 refills | Status: DC

## 2023-11-23 MED ORDER — CYCLOBENZAPRINE HCL 10 MG PO TABS: 10.0000 mg | ORAL_TABLET | Freq: Three times a day (TID) | ORAL | 0 refills | Status: DC | PRN

## 2023-11-23 MED ORDER — METFORMIN HCL 1000 MG PO TABS: 1000.0000 mg | ORAL_TABLET | Freq: Two times a day (BID) | ORAL | 0 refills | Status: DC

## 2023-11-23 NOTE — Telephone Encounter (Signed)
 Pt aware most of her meds have been sent 2 are waiting on providers approval. Her Esomeprazole  is prescribed by her GI provider and her Thyroid  med is by Dr. Lenis

## 2023-11-23 NOTE — Telephone Encounter (Signed)
  Prescription Request  11/23/2023  Is this a Controlled Substance medicine? no  Have you seen your PCP in the last 2 weeks? Pt last seen in June 2025 is now changing pharmacies and needs prescriptions sent   If YES, route message to pool  -  If NO, patient needs to be scheduled for appointment.  What is the name of the medication or equipment?metFORMIN  (GLUCOPHAGE ) 1000 MG tablet  losartan  (COZAAR ) 100 MG tablet  cyclobenzaprine  (FLEXERIL ) 10 MG tablet  gabapentin  (NEURONTIN ) 100 MG capsule  ibuprofen  (ADVIL ) 800 MG tablet  rosuvastatin  (CRESTOR ) 10 MG tablet  Semaglutide ,0.25 or 0.5MG /DOS, 2 MG/3ML SOPN  escitalopram  (LEXAPRO ) 20 MG tablet  desvenlafaxine  (PRISTIQ ) 100 MG 24 hr tablet  busPIRone  (BUSPAR ) 5 MG tablet  esomeprazole  (NEXIUM ) 40 MG capsule glimepiride  (AMARYL ) 2 MG tablet   levothyroxine  (SYNTHROID ) 125 MCG tablet     Have you contacted your pharmacy to request a refill? no   Which pharmacy would you like this sent to? [facility] [phone] Ph#[phone]   Patient notified that their request is being sent to the clinical staff for review and that they should receive a response within 2 business days.

## 2023-12-05 ENCOUNTER — Other Ambulatory Visit: Payer: Self-pay | Admitting: Medical Genetics

## 2023-12-06 ENCOUNTER — Other Ambulatory Visit (HOSPITAL_COMMUNITY)
Admission: RE | Admit: 2023-12-06 | Discharge: 2023-12-06 | Disposition: A | Discharge status: Home / Self Care | Payer: Self-pay | Source: Ambulatory Visit | Attending: Medical Genetics | Admitting: Medical Genetics

## 2023-12-10 ENCOUNTER — Other Ambulatory Visit: Payer: Self-pay | Admitting: Gastroenterology

## 2023-12-10 DIAGNOSIS — R197 Diarrhea, unspecified: Secondary | ICD-10-CM

## 2023-12-10 DIAGNOSIS — K219 Gastro-esophageal reflux disease without esophagitis: Secondary | ICD-10-CM

## 2023-12-10 DIAGNOSIS — R112 Nausea with vomiting, unspecified: Secondary | ICD-10-CM

## 2023-12-11 NOTE — Telephone Encounter (Signed)
 Sending in refills. Recommend follow-up sometime in the next 3 months.

## 2023-12-17 LAB — GENECONNECT MOLECULAR SCREEN: Genetic Analysis Overall Interpretation: NEGATIVE

## 2023-12-26 ENCOUNTER — Encounter: Payer: Self-pay | Admitting: Cardiology

## 2023-12-26 NOTE — Progress Notes (Unsigned)
 Cardiology Office Note   Date:  12/27/2023   ID:  Sherry, Glass Jan 13, 1971, MRN [id number]  PCP:  Lavell Bari LABOR, [name]  Cardiologist:   None Referring:  Lavell Bari LABOR, [name]  Chief Complaint  Patient presents with   Chest Pain      History of Present Illness: Sherry Glass is a 53 y.o. female who presents for evaluation of chest pain.  She saw Dr. Dann in the past for evaluation of cardiomegaly chest pain and SOB.  In 2018 she had an echo with NL LV function.  There were no valvular abnormalities.    She reports that she has been getting chest discomfort for about 4 to 5 months.  This has been sharp.  It is a little bit left of sternum.  It can be 8 out of 10 in intensity and lasts for an hour.  She gets a little diaphoretic with it.  Seems to be happening multiple times a day at rest.  She can exercise and it might not come on but it can happen with activities.  She has to stop and calm down let it go away.  She thinks it is happening with more frequency and intensity.  She is having a little bit of radiation to the clavicle but she is not having any jaw pain or down into her left arm.  She has no new shortness of breath, PND or orthopnea.  She has no new palpitations, presyncope or syncope.   Past Medical History:  Diagnosis Date   Allergic rhinitis    Anxiety and depression    Asthma    pt states this doesn't really bother her anymore   Cataract    COPD (chronic obstructive pulmonary disease) (HCC)    Depression    Diabetes mellitus without complication (HCC)    History of kidney stones    HTN (hypertension)    Hypercholesterolemia    Hypothyroidism    N&V (nausea and vomiting) 01/08/2021   Obesity    Restless leg syndrome    Tobacco abuse     Past Surgical History:  Procedure Laterality Date   CARDIAC CATHETERIZATION  2014   CATARACT EXTRACTION Right 2023   CATARACT EXTRACTION W/PHACO Left 06/23/2016   Procedure: CATARACT EXTRACTION PHACO AND  INTRAOCULAR LENS PLACEMENT (IOC);  Surgeon: Cherene Mania, MD;  Location: [facility];  Service: Ophthalmology;  Laterality: Left;  left - pt knows to arrive at 10:50 cde 9.11   CESAREAN SECTION     CHOLECYSTECTOMY     COLONOSCOPY WITH PROPOFOL  N/A 10/14/2019   Procedure: COLONOSCOPY WITH PROPOFOL ;  Surgeon: Shaaron Lamar CHRISTELLA, MD;  Location: [facility];  Service: Endoscopy;  Laterality: N/A;  8:00   ESOPHAGOGASTRODUODENOSCOPY (EGD) WITH PROPOFOL  N/A 02/01/2021   Surgeon: Shaaron Lamar CHRISTELLA, MD; normal exam   EXCISION MASS NECK     cyst   left foot surgery     removal of cyst   POLYPECTOMY  10/14/2019   Procedure: POLYPECTOMY;  Surgeon: Shaaron Lamar CHRISTELLA, MD;  Location: [facility];  Service: Endoscopy;;   RADICAL ABDOMINAL HYSTERECTOMY     SHOULDER ARTHROSCOPY Left 08/05/2021   Procedure: left shoulder arthroscopy, manipulation under anesthesia, rotator interval release;  Surgeon: Addie Cordella Hamilton, MD;  Location: [facility];  Service: Orthopedics;  Laterality: Left;   THYROID  SURGERY     Dr. Mavis   TONSILLECTOMY       Current Outpatient Medications  Medication Sig Dispense Refill  albuterol  (VENTOLIN  HFA) 108 (90 Base) MCG/ACT inhaler Inhale 2 puffs into the lungs every 6 (six) hours as needed for wheezing or shortness of breath. 8 g 2   aspirin  EC 81 MG tablet Take one po qd 90 tablet 1   budesonide -formoterol  (SYMBICORT ) 80-4.5 MCG/ACT inhaler Take 2 puffs first thing in am and then another 2 puffs about 12 hours later. 1 each 12   busPIRone  (BUSPAR ) 5 MG tablet Take 1 tablet (5 mg total) by mouth 3 (three) times daily as needed. 90 tablet 1   cyclobenzaprine  (FLEXERIL ) 10 MG tablet Take 1 tablet (10 mg total) by mouth 3 (three) times daily as needed. for muscle spams 90 tablet 0   desvenlafaxine  (PRISTIQ ) 100 MG 24 hr tablet Take 1 tablet (100 mg total) by mouth daily. 90 tablet 0   escitalopram  (LEXAPRO ) 20 MG tablet Take 20 mg by mouth daily.     esomeprazole  (NEXIUM ) 40 MG capsule TAKE  1 CAPSULE BY MOUTH ONCE DAILY BEFORE BREAKFAST 30 capsule 5   gabapentin  (NEURONTIN ) 100 MG capsule Take 1 capsule (100 mg total) by mouth 3 (three) times daily. 90 capsule 1   glimepiride  (AMARYL ) 2 MG tablet Take 1 tablet (2 mg total) by mouth daily with breakfast. 90 tablet 0   ibuprofen  (ADVIL ) 800 MG tablet Take 1 tablet (800 mg total) by mouth 2 (two) times daily. 180 tablet 0   levothyroxine  (SYNTHROID ) 125 MCG tablet Take 1 tablet (125 mcg total) by mouth daily before breakfast. 95 tablet 3   losartan  (COZAAR ) 100 MG tablet Take 1 tablet (100 mg total) by mouth daily. 90 tablet 0   metFORMIN  (GLUCOPHAGE ) 1000 MG tablet Take 1 tablet (1,000 mg total) by mouth 2 (two) times daily with a meal. 180 tablet 0   metoprolol  tartrate (LOPRESSOR ) 100 MG tablet Take 1 tablet (100 mg total) by mouth once for 1 dose. 1 tablet 0   rosuvastatin  (CRESTOR ) 10 MG tablet Take 1 tablet (10 mg total) by mouth daily. 90 tablet 0   Semaglutide ,0.25 or 0.5MG /DOS, 2 MG/3ML SOPN Inject 0.5 mg into the skin once a week. 3 mL 1   TALTZ 80 MG/ML SOSY Inject into the skin every 30 (thirty) days.     Vitamin D , Ergocalciferol , (DRISDOL ) 1.25 MG (50000 UNIT) CAPS capsule Take 1 capsule (50,000 Units total) by mouth every 7 (seven) days. 12 capsule 3   famotidine  (PEPCID ) 20 MG tablet One after supper 30 tablet 11   No current facility-administered medications for this visit.    Allergies:   Dilaudid [hydromorphone], Doxycycline , and Valproic acid and related    Social History:  The patient  reports that she quit smoking about 7 years ago. Her smoking use included cigarettes. She started smoking about 27 years ago. She has a 20 pack-year smoking history. She has never used smokeless tobacco. She reports that she does not currently use alcohol. She reports that she does not use drugs.   Family History:  The patient's family history is not on file. She was adopted.    ROS:  Please see the history of present illness.    Otherwise, review of systems are positive for none.   All other systems are reviewed and negative.    PHYSICAL EXAM: VS:  BP (!) 142/78   Pulse 81   Ht 5' 4 (1.626 m)   Wt 193 lb (87.5 kg)   BMI 33.13 kg/m  , BMI Body mass index is 33.13 kg/m. GENERAL:  Well  appearing HEENT:  Pupils equal round and reactive, fundi not visualized, oral mucosa unremarkable NECK:  No jugular venous distention, waveform within normal limits, carotid upstroke brisk and symmetric, no bruits, no thyromegaly LYMPHATICS:  No cervical, inguinal adenopathy LUNGS:  Clear to auscultation bilaterally BACK:  No CVA tenderness CHEST:  Unremarkable HEART:  PMI not displaced or sustained,S1 and S2 within normal limits, no S3, no S4, no clicks, no rubs, no murmurs ABD:  Flat, positive bowel sounds normal in frequency in pitch, no bruits, no rebound, no guarding, no midline pulsatile mass, no hepatomegaly, no splenomegaly EXT:  2 plus pulses throughout, no edema, no cyanosis no clubbing SKIN:  No rashes no nodules NEURO:  Cranial nerves II through XII grossly intact, motor grossly intact throughout [facility]:  Cognitively intact, oriented to person place and time    EKG:  EKG Interpretation Date/Time:  Wednesday December 27 2023 15:10:40 EDT Ventricular Rate:  81 PR Interval:  170 QRS Duration:  68 QT Interval:  374 QTC Calculation: 434 R Axis:   68  Text Interpretation: Normal sinus rhythm Normal ECG When compared with ECG of 02-Aug-2021 10:20, No significant change was found Confirmed by Lavona Agent ([id number]) on 12/27/2023 3:40:50 PM     Recent Labs: 03/22/2023: TSH 3.450 08/10/2023: Hemoglobin 13.4; Platelets 271 10/30/2023: ALT 23; BUN 8; Creatinine, Ser 0.72; Potassium 4.1; Sodium 143    Lipid Panel    Component Value Date/Time   CHOL 128 08/10/2023 0942   TRIG 211 (H) 08/10/2023 0942   HDL 42 08/10/2023 0942   CHOLHDL 3.0 08/10/2023 0942   LDLCALC 52 08/10/2023 0942      Wt Readings from Last 3  Encounters:  12/27/23 193 lb (87.5 kg)  10/30/23 198 lb 3.2 oz (89.9 kg)  09/20/23 205 lb (93 kg)      Other studies Reviewed: Additional studies/ records that were reviewed today include: Labs. Review of the above records demonstrates:  Please see elsewhere in the note.     ASSESSMENT AND PLAN:  Chest pain:   Her chest discomfort could represent unstable angina.  She has significant cardiovascular risk factors.  The pretest probability of obstructive coronary disease is at least moderately high.  I am going to order a coronary CT.  Further evaluation would based on this.  At the very least she needs continued primary risk reduction.  Dyslipidemia: LDL was 52.  Continue the meds as listed.  HTN: Her blood pressure is mildly elevated but this is unusual.  I have asked her to keep an eye on this with goals of 120s over 70s.  DM: A1c is 6.3.  She will continue on the meds as listed and follow-up with her primary provider.   Current medicines are reviewed at length with the patient today.  The patient does not have concerns regarding medicines.  The following changes have been made:  no change  Labs/ tests ordered today include:   Orders Placed This Encounter  Procedures   CT CORONARY MORPH W/CTA COR W/SCORE W/CA W/CM &/OR WO/CM   Basic metabolic panel with GFR   EKG 87-[place]     Disposition:   FU with with me as needed based on results of the above   Signed, Agent Lavona, MD  12/27/2023 4:10 PM    [facility]

## 2023-12-27 ENCOUNTER — Ambulatory Visit (INDEPENDENT_AMBULATORY_CARE_PROVIDER_SITE_OTHER): Admitting: Cardiology

## 2023-12-27 ENCOUNTER — Encounter: Payer: Self-pay | Admitting: Cardiology

## 2023-12-27 ENCOUNTER — Encounter: Payer: Self-pay | Admitting: *Deleted

## 2023-12-27 VITALS — BP 142/78 | HR 81 | Ht 64.0 in | Wt 193.0 lb

## 2023-12-27 DIAGNOSIS — R072 Precordial pain: Secondary | ICD-10-CM

## 2023-12-27 DIAGNOSIS — E785 Hyperlipidemia, unspecified: Secondary | ICD-10-CM

## 2023-12-27 DIAGNOSIS — Z01812 Encounter for preprocedural laboratory examination: Secondary | ICD-10-CM | POA: Diagnosis not present

## 2023-12-27 DIAGNOSIS — I200 Unstable angina: Secondary | ICD-10-CM | POA: Diagnosis not present

## 2023-12-27 DIAGNOSIS — I10 Essential (primary) hypertension: Secondary | ICD-10-CM | POA: Diagnosis not present

## 2023-12-27 MED ORDER — METOPROLOL TARTRATE 100 MG PO TABS
100.0000 mg | ORAL_TABLET | Freq: Once | ORAL | 0 refills | Status: AC
Start: 2023-12-27 — End: 2024-03-13

## 2023-12-27 NOTE — Patient Instructions (Signed)
 Medication Instructions:   Continue all current medications.   Labwork:  BMET - order given today   Testing/Procedures:  Coronary CTA   Follow-Up:  Office will contact with results via phone, letter or mychart.   As needed   Any Other Special Instructions Will Be Listed Below (If Applicable).   If you need a refill on your cardiac medications before your next appointment, please call your pharmacy.]

## 2024-01-02 ENCOUNTER — Encounter (HOSPITAL_COMMUNITY): Payer: Self-pay

## 2024-01-02 ENCOUNTER — Other Ambulatory Visit

## 2024-01-02 DIAGNOSIS — I10 Essential (primary) hypertension: Secondary | ICD-10-CM | POA: Diagnosis not present

## 2024-01-02 DIAGNOSIS — Z01812 Encounter for preprocedural laboratory examination: Secondary | ICD-10-CM | POA: Diagnosis not present

## 2024-01-03 ENCOUNTER — Ambulatory Visit: Payer: Self-pay | Admitting: Cardiology

## 2024-01-03 LAB — BASIC METABOLIC PANEL WITH GFR
BUN/Creatinine Ratio: 19 (ref 9–23)
BUN: 15 mg/dL (ref 6–24)
CO2: 18 mmol/L — ABNORMAL LOW (ref 20–29)
Calcium: 9.7 mg/dL (ref 8.7–10.2)
Chloride: 106 mmol/L (ref 96–106)
Creatinine, Ser: 0.77 mg/dL (ref 0.57–1.00)
Glucose: 124 mg/dL — ABNORMAL HIGH (ref 70–99)
Potassium: 4.5 mmol/L (ref 3.5–5.2)
Sodium: 142 mmol/L (ref 134–144)
eGFR: 91 mL/min/1.73 (ref >59)

## 2024-01-04 ENCOUNTER — Ambulatory Visit (HOSPITAL_COMMUNITY)
Admission: RE | Admit: 2024-01-04 | Discharge: 2024-01-04 | Disposition: A | Discharge status: Home / Self Care | Source: Ambulatory Visit | Attending: Cardiology | Admitting: Cardiology

## 2024-01-04 DIAGNOSIS — I200 Unstable angina: Secondary | ICD-10-CM | POA: Diagnosis present

## 2024-01-04 DIAGNOSIS — R072 Precordial pain: Secondary | ICD-10-CM | POA: Diagnosis not present

## 2024-01-04 MED ORDER — IOHEXOL 350 MG/ML SOLN
100.0000 mL | Freq: Once | INTRAVENOUS | Status: AC | PRN
  Administered 2024-01-04: 100 mL via INTRAVENOUS

## 2024-01-04 MED ORDER — NITROGLYCERIN 0.4 MG SL SUBL
0.8000 mg | SUBLINGUAL_TABLET | Freq: Once | SUBLINGUAL | Status: AC
  Administered 2024-01-04: 0.8 mg via SUBLINGUAL

## 2024-01-04 NOTE — Progress Notes (Signed)
 Patient completed her cardiac CT scan. She denies any symptoms.

## 2024-01-25 ENCOUNTER — Telehealth: Payer: Self-pay

## 2024-01-25 ENCOUNTER — Ambulatory Visit (HOSPITAL_COMMUNITY): Admission: RE | Admit: 2024-01-25 | Source: Ambulatory Visit

## 2024-01-25 NOTE — Telephone Encounter (Signed)
 LVM to call office and reschedule cancelled scan from this morning.

## 2024-02-12 ENCOUNTER — Ambulatory Visit: Payer: Self-pay

## 2024-02-12 NOTE — Telephone Encounter (Signed)
 Patient needs an appointment it can be virtual

## 2024-02-12 NOTE — Telephone Encounter (Signed)
 FYI Only or Action Required?: Action required by provider: prednisone  prescription request.  Patient was last seen in primary care on 10/30/2023 by Lavell Bari LABOR, [name].  Called Nurse Triage reporting New Med Request and Nasal Congestion.  Symptoms began yesterday.  Interventions attempted: Rest, hydration, or home remedies.  Symptoms are: gradually worsening.  Triage Disposition: Home Care  Patient/caregiver understands and will follow disposition?: No, wishes to speak with PCP   Copied from CRM [id number]. Topic: Clinical - Medical Advice >> Feb 12, 2024  9:47 AM Graeme ORN wrote: Reason for CRM: Patient called requested that [facility] send her steroids in for congestion. States she has previously done had this and been prescribed steroids by Bari in the past. Thank You Reason for Disposition  [1] Sinus congestion as part of a cold AND [2] present < 10 days  Answer Assessment - Initial Assessment Questions Additional info: 1) Patient requesting prescription for prednisone  for nasal congestion. She states she is typically treated with prednisone  and does not need an appointment.  2) Refused appointment. Requesting pcp sent prednisone  to pharmacy.     1. LOCATION: Where does it hurt?      Nasal congestion 2. ONSET: When did the sinus pain start?  (e.g., hours, days)      2 days  3. SEVERITY: How bad is the pain?   (Scale 0-10; or none, mild, moderate or severe)     Some mild sinus pressure 4. RECURRENT SYMPTOM: Have you ever had sinus problems before? If Yes, ask: When was the last time? and What happened that time?      Yes 5. NASAL CONGESTION: Is the nose blocked? If Yes, ask: Can you open it or must you breathe through your mouth?     congestion 6. NASAL DISCHARGE: Do you have discharge from your nose? If so ask, What color?     no 7. FEVER: Do you have a fever? If Yes, ask: What is it, how was it measured, and when did it start?      Denies  8.  OTHER SYMPTOMS: Do you have any other symptoms? (e.g., sore throat, cough, earache, difficulty breathing)     Chronic cough is unchanged  Protocols used: Sinus Pain or Congestion-A-AH

## 2024-02-12 NOTE — Telephone Encounter (Signed)
 Apt scheduled.

## 2024-02-13 ENCOUNTER — Ambulatory Visit: Admitting: Nurse Practitioner

## 2024-02-13 VITALS — BP 154/70 | HR 73 | Temp 96.4°F | Ht 64.0 in | Wt 200.0 lb

## 2024-02-13 DIAGNOSIS — J069 Acute upper respiratory infection, unspecified: Secondary | ICD-10-CM

## 2024-02-13 MED ORDER — AMOXICILLIN-POT CLAVULANATE 875-125 MG PO TABS: 1.0000 | ORAL_TABLET | Freq: Two times a day (BID) | ORAL | 0 refills | Status: DC

## 2024-02-13 MED ORDER — ALBUTEROL SULFATE 1.25 MG/3ML IN NEBU: 1.0000 | INHALATION_SOLUTION | Freq: Four times a day (QID) | RESPIRATORY_TRACT | 12 refills | Status: AC | PRN

## 2024-02-13 NOTE — Patient Instructions (Addendum)
 1. Take meds as prescribed 2. Use a cool mist humidifier especially during the winter months and when heat has been humid. 3. Use saline nose sprays frequently 4. Saline irrigations of the nose can be very helpful if done frequently.  * 4X daily for 1 week*  * Use of a nettie pot can be helpful with this. Follow directions with this* 5. Drink plenty of fluids 6. Keep thermostat turn down low 7.For any cough or congestion- mucinex if needed 8. For fever or aces or pains- take tylenol or ibuprofen appropriate for age and weight.  * for fevers greater than 101 orally you may alternate ibuprofen and tylenol every  3 hours.

## 2024-02-13 NOTE — Progress Notes (Signed)
 Subjective:    Patient ID: Sherry [name], female    DOB: 11/01/1970, 53 y.o.   MRN: [id number]   Chief Complaint: Cough and Sherry [name]   URI  This is [name] new problem. The current episode started in the past 7 days. The problem has been gradually worsening. There has been no fever. Associated symptoms include [name], coughing, headaches and rhinorrhea. She has tried acetaminophen  and decongestant for the symptoms. The treatment provided mild relief.    Patient Active Problem List   Diagnosis Date Noted   DOE (dyspnea on exertion) 04/21/2023   Arthropathy of lumbar facet joint 04/20/2023   Chronic bilateral low back pain with sciatica 09/22/2022   Adrenal mass 11/11/2021   Adhesive capsulitis of left shoulder    Spinal stenosis of lumbar region 07/24/2020   Lumbar radiculitis 11/21/2019   Lumbar spondylosis 11/21/2019   Lumbar stenosis with neurogenic claudication 11/21/2019   Hepatic steatosis 05/23/2019   Psoriasis 05/09/2019   Depression, major, single episode, mild 10/12/2018   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   Nocturnal hypoxemia 05/29/2017   GAD (generalized anxiety disorder) 02/13/2017   Restrictive lung disease 01/27/2017   Lung nodule 01/27/2017   GERD (gastroesophageal reflux disease) 11/18/2016   Snoring 11/18/2016   Witnessed episode of apnea 11/18/2016   Chronic seasonal allergic rhinitis 11/18/2016   Restless leg syndrome 11/18/2016   Fibromyalgia 11/18/2016   Hyperlipidemia associated with type 2 diabetes mellitus (HCC) 06/09/2016   Vitamin D  deficiency 05/16/2016   Morbid obesity (HCC) 05/13/2016   Diabetes mellitus (HCC) 05/13/2016   COPD (chronic obstructive pulmonary disease) (HCC) 05/13/2016   Chest pain syndrome    Hypertension associated with diabetes (HCC)    Stopped smoking with greater than 25 pack year history        Review of Systems  HENT:  Positive for [name] and rhinorrhea.   Respiratory:  Positive  for cough.   Neurological:  Positive for headaches.       Objective:   Physical Exam Constitutional:      Appearance: Normal appearance. She is obese.  HENT:     Right Ear: Tympanic membrane normal.     Left Ear: Tympanic membrane normal.     Nose: [name] and rhinorrhea present.     Mouth/Throat:     Pharynx: Posterior oropharyngeal erythema present. No oropharyngeal exudate.  Cardiovascular:     Rate and Rhythm: Normal rate and regular rhythm.     Heart sounds: Normal heart sounds.  Pulmonary:     Breath sounds: Wheezing (exp wheezing throughout) present.  Musculoskeletal:     Cervical back: Normal range of motion and neck supple.  Skin:    General: Skin is warm.  Neurological:     General: No focal deficit present.     Mental Status: She is alert and oriented to person, place, and time.  Psychiatric:        Mood and Affect: Mood normal.        Behavior: Behavior normal.     BP (!) 154/70   Pulse 73   Temp (!) 96.4 F (35.8 C) (Temporal)   Ht 5' 4 (1.626 m)   Wt 200 lb (90.7 kg)   SpO2 95%   BMI 34.33 kg/m        Assessment & Plan:  Sherry [name]   1. URI with cough and [name] (Primary)  1. Take meds as prescribed 2. Use [name]  cool mist humidifier especially during the winter months and when heat has been humid. 3. Use saline nose sprays frequently 4. Saline irrigations of the nose can be very helpful if done frequently.  * 4X daily for 1 week*  * Use of [name] nettie pot can be helpful with this. Follow directions with this* 5. Drink plenty of fluids 6. Keep thermostat turn down low 7.For any cough or [name]- mucinex if needed 8. For fever or aces or pains- take tylenol  or ibuprofen  appropriate for age and weight.  * for fevers greater than 101 orally you may alternate ibuprofen  and tylenol  every  3 hours. Nebulizer as needed  Meds ordered this encounter  Medications    amoxicillin -clavulanate (AUGMENTIN ) 875-125 MG tablet    Sig: Take 1 tablet by mouth 2 (two) times daily.    Dispense:  20 tablet    Refill:  0    Supervising Provider:   DETTINGER, Sherry [name] [[id number]]   albuterol  (ACCUNEB ) 1.25 MG/3ML nebulizer solution    Sig: Take 3 mLs (1.25 mg total) by nebulization every 6 (six) hours as needed for wheezing.    Dispense:  75 mL    Refill:  12    Supervising Provider:   MARYANNE CHEW [name] [[id number]]     The above assessment and management plan was discussed with the patient. The patient verbalized understanding of and has agreed to the management plan. Patient is aware to call the clinic if symptoms persist or worsen. Patient is aware when to return to the clinic for [name] follow-up visit. Patient educated on when it is appropriate to go to the emergency department.   Sherry Gladis, [name]

## 2024-02-16 ENCOUNTER — Ambulatory Visit (HOSPITAL_COMMUNITY)

## 2024-02-19 NOTE — Telephone Encounter (Signed)
 10/17 LDCT cancelled. Pt was seen by PCP for URI. Will defer until treatment is completed and symptoms resolved.

## 2024-02-27 ENCOUNTER — Other Ambulatory Visit: Payer: Self-pay | Admitting: Family Medicine

## 2024-02-27 ENCOUNTER — Other Ambulatory Visit: Payer: Self-pay | Admitting: Family

## 2024-02-27 DIAGNOSIS — I152 Hypertension secondary to endocrine disorders: Secondary | ICD-10-CM

## 2024-02-27 DIAGNOSIS — F320 Major depressive disorder, single episode, mild: Secondary | ICD-10-CM

## 2024-02-27 DIAGNOSIS — E1165 Type 2 diabetes mellitus with hyperglycemia: Secondary | ICD-10-CM

## 2024-02-27 DIAGNOSIS — G8929 Other chronic pain: Secondary | ICD-10-CM

## 2024-02-27 DIAGNOSIS — E1169 Type 2 diabetes mellitus with other specified complication: Secondary | ICD-10-CM

## 2024-02-27 DIAGNOSIS — M25552 Pain in left hip: Secondary | ICD-10-CM

## 2024-02-27 DIAGNOSIS — F411 Generalized anxiety disorder: Secondary | ICD-10-CM

## 2024-02-27 NOTE — Telephone Encounter (Signed)
 Apt scheduled 03/04/2024

## 2024-02-27 NOTE — Telephone Encounter (Signed)
 Christy NTBS for 3 mos FU from June RFs sent to mail order pharmacy

## 2024-03-04 ENCOUNTER — Ambulatory Visit: Payer: Self-pay | Admitting: Family

## 2024-03-04 ENCOUNTER — Encounter: Payer: Self-pay | Admitting: Family

## 2024-03-04 VITALS — BP 109/72 | HR 80 | Temp 97.2°F | Ht 64.0 in | Wt 194.0 lb

## 2024-03-04 DIAGNOSIS — K219 Gastro-esophageal reflux disease without esophagitis: Secondary | ICD-10-CM

## 2024-03-04 DIAGNOSIS — I152 Hypertension secondary to endocrine disorders: Secondary | ICD-10-CM | POA: Diagnosis not present

## 2024-03-04 DIAGNOSIS — Z7985 Long-term (current) use of injectable non-insulin antidiabetic drugs: Secondary | ICD-10-CM | POA: Diagnosis not present

## 2024-03-04 DIAGNOSIS — E669 Obesity, unspecified: Secondary | ICD-10-CM

## 2024-03-04 DIAGNOSIS — E1169 Type 2 diabetes mellitus with other specified complication: Secondary | ICD-10-CM

## 2024-03-04 DIAGNOSIS — E785 Hyperlipidemia, unspecified: Secondary | ICD-10-CM | POA: Diagnosis not present

## 2024-03-04 DIAGNOSIS — J441 Chronic obstructive pulmonary disease with (acute) exacerbation: Secondary | ICD-10-CM

## 2024-03-04 DIAGNOSIS — F320 Major depressive disorder, single episode, mild: Secondary | ICD-10-CM | POA: Diagnosis not present

## 2024-03-04 DIAGNOSIS — E1159 Type 2 diabetes mellitus with other circulatory complications: Secondary | ICD-10-CM

## 2024-03-04 DIAGNOSIS — J984 Other disorders of lung: Secondary | ICD-10-CM

## 2024-03-04 DIAGNOSIS — E890 Postprocedural hypothyroidism: Secondary | ICD-10-CM | POA: Diagnosis not present

## 2024-03-04 LAB — BAYER DCA HB A1C WAIVED: HB A1C (BAYER DCA - WAIVED): 6 % — ABNORMAL HIGH (ref 4.8–5.6)

## 2024-03-04 LAB — CBC WITH DIFFERENTIAL/PLATELET

## 2024-03-04 MED ORDER — SEMAGLUTIDE (1 MG/DOSE) 4 MG/3ML ~~LOC~~ SOPN: 1.0000 mg | PEN_INJECTOR | SUBCUTANEOUS | 3 refills | Status: DC

## 2024-03-04 NOTE — Progress Notes (Signed)
 Subjective:    Patient ID: Sherry Glass, female    DOB: 07/01/1970, 53 y.o.   MRN: [id number]  Chief Complaint  Patient presents with   Medical Management of Chronic Issues   Pt presents to the office today for chronic follow up.    Pt has seen Pulmonologist for COPD, Restrictive lung disease, and SOB, but has been cleared. She quit smoking 2018.  She had thyroid  ablation.   She is followed by [facility] as needed for chronic back pain. Had a MRI 05/22/22.    She is morbid obese with a BMI of 33 and DM and HTN. She was started on Ozempic  0.5 mg. Her starting weight was 205 lb. She was on Trulicity  prior.      03/04/2024    9:25 AM 02/13/2024   10:21 AM 12/27/2023    3:05 PM  Last 3 Weights  Weight (lbs) 194 lb 200 lb 193 lb  Weight (kg) 87.998 kg 90.719 kg 87.544 kg       COPD She complains of cough, hoarse voice and wheezing. This is a chronic problem. The current episode started more than 1 year ago. The problem occurs intermittently. Associated symptoms include heartburn. Pertinent negatives include no malaise/fatigue. Her symptoms are alleviated by rest. She reports moderate improvement on treatment. Her past medical history is significant for COPD.  Diabetes She presents for her follow-up diabetic visit. She has type 2 diabetes mellitus. Hypoglycemia symptoms include nervousness/anxiousness. Pertinent negatives for diabetes include no blurred vision and no foot paresthesias. Symptoms are stable. Risk factors for coronary artery disease include diabetes mellitus, dyslipidemia, hypertension and sedentary lifestyle. She is following a generally healthy diet. Her overall blood glucose range is 110-130 mg/dl. Eye exam is current.  Hypertension This is a chronic problem. The current episode started more than 1 year ago. The problem has been resolved since onset. The problem is controlled. Associated symptoms include anxiety. Pertinent negatives include no blurred vision,  malaise/fatigue or peripheral edema. Risk factors for coronary artery disease include dyslipidemia, diabetes mellitus, sedentary lifestyle and obesity. The current treatment provides moderate improvement.  Gastroesophageal Reflux She complains of belching, coughing, heartburn, a hoarse voice and wheezing. This is a chronic problem. The current episode started more than 1 year ago. The problem occurs rarely. The symptoms are aggravated by certain foods. Risk factors include obesity. She has tried a PPI for the symptoms. The treatment provided moderate relief.  Hyperlipidemia This is a chronic problem. The current episode started more than 1 year ago. The problem is controlled. Recent lipid tests were reviewed and are normal. Exacerbating diseases include obesity. Current antihyperlipidemic treatment includes statins. The current treatment provides moderate improvement of lipids. Risk factors for coronary artery disease include dyslipidemia, diabetes mellitus, hypertension, a sedentary lifestyle, post-menopausal and obesity.  Back Pain This is a chronic problem. The current episode started more than 1 year ago. The problem occurs intermittently. The problem has been waxing and waning since onset. The pain is present in the lumbar spine. The quality of the pain is described as aching. The pain is at a severity of 9/10. The pain is moderate. The symptoms are aggravated by bending and twisting. Risk factors include obesity. She has tried bed rest and NSAIDs for the symptoms. The treatment provided moderate relief.  Anxiety Presents for follow-up visit. Symptoms include depressed mood, excessive worry and nervous/anxious behavior. Patient reports no decreased concentration or suicidal ideas. Symptoms occur occasionally. The severity of symptoms is moderate.  Depression        This is a chronic problem.  The current episode started more than 1 year ago.   The problem occurs intermittently.  Associated  symptoms include no decreased concentration, no helplessness, no hopelessness, not sad and no suicidal ideas.  Past treatments include SNRIs - Serotonin and norepinephrine reuptake inhibitors.  Past medical history includes anxiety.       Review of Systems  Constitutional:  Negative for malaise/fatigue.  HENT:  Positive for hoarse voice.   Eyes:  Negative for blurred vision.  Respiratory:  Positive for cough and wheezing.   Gastrointestinal:  Positive for heartburn.  Musculoskeletal:  Positive for back pain.  Psychiatric/Behavioral:  Negative for decreased concentration and suicidal ideas. The patient is nervous/anxious.   All other systems reviewed and are negative.  Family History  Adopted: Yes  Problem Relation Age of Onset   Breast cancer Neg Hx    Social History   Socioeconomic History   Marital status: Widowed    Spouse name: Not on file   Number of children: 4   Years of education: Not on file   Highest education level: 12th grade  Occupational History   Not on file  Tobacco Use   Smoking status: Former    Current packs/day: 0.00    Average packs/day: 1 pack/day for 20.0 years (20.0 ttl pk-yrs)    Types: Cigarettes    Start date: 11/13/1996    Quit date: 10/28/2016    Years since quitting: 7.3   Smokeless tobacco: Never  Vaping Use   Vaping status: Never Used  Substance and Sexual Activity   Alcohol use: Not Currently    Comment: For special occasions, very rare    Drug use: No   Sexual activity: Never    Birth control/protection: Surgical  Other Topics Concern   Not on file  Social History Narrative   [facility] (11/18/16):   Originally from [place] but grew up in [place]. Cared for her husband until he passed. She previously worked in designer, fashion/clothing in geographical information systems officer, set designer, sewing, cutting cloth, etc. Does have significant dust exposure. Has 4 dogs currently. Previously had a parakeet as a child. No mold exposure. Enjoys playing with her grandchildren.         Social Drivers of Corporate Investment Banker Strain: Low Risk  (02/13/2024)   Overall Financial Resource Strain (CARDIA)    Difficulty of Paying Living Expenses: Not very hard  Food Insecurity: No Food Insecurity (02/13/2024)   Hunger Vital Sign    Worried About Running Out of Food in the Last Year: Never true    Ran Out of Food in the Last Year: Never true  Transportation Needs: No Transportation Needs (02/13/2024)   PRAPARE - Administrator, Civil Service (Medical): No    Lack of Transportation (Non-Medical): No  Physical Activity: Insufficiently Active (02/13/2024)   Exercise Vital Sign    Days of Exercise per Week: 4 days    Minutes of Exercise per Session: 30 min  Stress: No Stress Concern Present (02/13/2024)   [employer] of Occupational Health - Occupational Stress Questionnaire    Feeling of Stress: Only a little  Social Connections: Socially Isolated (02/13/2024)   Social Connection and Isolation Panel    Frequency of Communication with Friends and Family: Three times a week    Frequency of Social Gatherings with Friends and Family: Never    Attends Religious Services: Never    Active Member of Clubs or  Organizations: No    Attends Banker Meetings: Not on file    Marital Status: Widowed       Objective:   Physical Exam Vitals reviewed.  Constitutional:      General: She is not in acute distress.    Appearance: She is well-developed. She is obese.  HENT:     Head: Normocephalic and atraumatic.     Right Ear: Tympanic membrane normal.     Left Ear: Tympanic membrane normal.  Eyes:     Pupils: Pupils are equal, round, and reactive to light.  Neck:     Thyroid : No thyromegaly.  Cardiovascular:     Rate and Rhythm: Normal rate and regular rhythm.     Heart sounds: Normal heart sounds. No murmur heard. Pulmonary:     Effort: Pulmonary effort is normal. No respiratory distress.     Breath sounds: Wheezing present.  Abdominal:      General: Bowel sounds are normal. There is no distension.     Palpations: Abdomen is soft.     Tenderness: There is no abdominal tenderness.  Musculoskeletal:        General: No tenderness. Normal range of motion.     Cervical back: Normal range of motion and neck supple.  Skin:    General: Skin is warm and dry.  Neurological:     Mental Status: She is alert and oriented to person, place, and time.     Cranial Nerves: No cranial nerve deficit.     Deep Tendon Reflexes: Reflexes are normal and symmetric.  Psychiatric:        Mood and Affect: Mood normal. Mood is not anxious.        Behavior: Behavior normal.        Thought Content: Thought content normal.        Judgment: Judgment normal.      BP 109/72   Pulse 80   Temp (!) 97.2 F (36.2 C) (Temporal)   Ht 5' 4 (1.626 m)   Wt 194 lb (88 kg)   BMI 33.30 kg/m      Assessment & Plan:  JANNA OAK comes in today with chief complaint of Medical Management of Chronic Issues   Diagnosis and orders addressed:  1. Restrictive lung disease - CBC with Differential/Platelet - CMP14+EGFR  2. Type 2 diabetes mellitus with other specified complication, without long-term current use of insulin  (HCC) (Primary) - Bayer DCA Hb A1c Waived - CBC with Differential/Platelet - CMP14+EGFR - Semaglutide , 1 MG/DOSE, 4 MG/3ML SOPN; Inject 1 mg as directed once a week.  Dispense: 3 mL; Refill: 3  3. Hypothyroidism following radioiodine therapy - CBC with Differential/Platelet - CMP14+EGFR  4. Hypertension associated with diabetes (HCC) - CBC with Differential/Platelet - CMP14+EGFR  5. Hyperlipidemia associated with type 2 diabetes mellitus (HCC) - CBC with Differential/Platelet - CMP14+EGFR  6. Gastroesophageal reflux disease, unspecified whether esophagitis present - CBC with Differential/Platelet - CMP14+EGFR  7. Obesity (BMI 30-39.9) - CBC with Differential/Platelet - CMP14+EGFR  8. Depression, major, single episode,  mild  - CBC with Differential/Platelet - CMP14+EGFR  9. Chronic obstructive pulmonary disease with acute exacerbation (HCC) - CBC with Differential/Platelet - CMP14+EGFR     Labs  pending  Will increase Ozempic  to 1 mg from 0.5mg   Continue current medications Stress management   Health Maintenance reviewed Diet and exercise encouraged  Follow up plan: 4 months    Bari Learn, [name]

## 2024-03-04 NOTE — Patient Instructions (Signed)

## 2024-03-05 ENCOUNTER — Ambulatory Visit: Payer: Self-pay | Admitting: Family

## 2024-03-05 LAB — CMP14+EGFR
ALT: 19 IU/L (ref 0–32)
AST: 16 IU/L (ref 0–40)
Albumin: 4.6 g/dL (ref 3.8–4.9)
Alkaline Phosphatase: 104 IU/L (ref 49–135)
BUN/Creatinine Ratio: 19 (ref 9–23)
BUN: 14 mg/dL (ref 6–24)
Bilirubin Total: 1.2 mg/dL (ref 0.0–1.2)
CO2: 21 mmol/L (ref 20–29)
Calcium: 9.6 mg/dL (ref 8.7–10.2)
Chloride: 103 mmol/L (ref 96–106)
Creatinine, Ser: 0.72 mg/dL (ref 0.57–1.00)
Globulin, Total: 2.4 g/dL (ref 1.5–4.5)
Glucose: 99 mg/dL (ref 70–99)
Potassium: 4.8 mmol/L (ref 3.5–5.2)
Sodium: 139 mmol/L (ref 134–144)
Total Protein: 7 g/dL (ref 6.0–8.5)
eGFR: 99 mL/min/1.73 (ref >59)

## 2024-03-05 LAB — CBC WITH DIFFERENTIAL/PLATELET
Basos: 1 %
EOS (ABSOLUTE): 0.1 x10E3/uL (ref 0.0–0.2)
Eos: 6 %
Hematocrit: 45.1 % (ref 34.0–46.6)
Hemoglobin: 14.5 g/dL (ref 11.1–15.9)
Immature Granulocytes: 0 %
Immature Granulocytes: 0 x10E3/uL (ref 0.0–0.1)
Lymphs: 41 %
MCH: 31.9 pg (ref 26.6–33.0)
MCHC: 32.2 g/dL (ref 31.5–35.7)
MCV: 99 fL — AB (ref 79–97)
Monocytes Absolute: 0.6 x10E3/uL (ref 0.1–0.9)
Monocytes Absolute: 0.6 x10E3/uL — AB (ref 0.0–0.4)
Monocytes: 6 %
Neutrophils Absolute: 4.3 x10E3/uL — AB (ref 0.7–3.1)
Neutrophils Absolute: 4.8 x10E3/uL (ref 1.4–7.0)
Neutrophils: 46 %
Platelets: 326 x10E3/uL (ref 150–450)
RBC: 4.54 x10E6/uL (ref 3.77–5.28)
RDW: 13.9 % (ref 11.7–15.4)
WBC: 10.5 x10E3/uL (ref 3.4–10.8)

## 2024-03-13 ENCOUNTER — Ambulatory Visit: Admitting: Gastroenterology

## 2024-03-13 ENCOUNTER — Encounter: Payer: Self-pay | Admitting: Gastroenterology

## 2024-03-13 VITALS — BP 157/83 | HR 81 | Temp 98.7°F | Ht 64.0 in | Wt 197.6 lb

## 2024-03-13 DIAGNOSIS — R112 Nausea with vomiting, unspecified: Secondary | ICD-10-CM

## 2024-03-13 DIAGNOSIS — K760 Fatty (change of) liver, not elsewhere classified: Secondary | ICD-10-CM | POA: Diagnosis not present

## 2024-03-13 DIAGNOSIS — K219 Gastro-esophageal reflux disease without esophagitis: Secondary | ICD-10-CM

## 2024-03-13 DIAGNOSIS — R197 Diarrhea, unspecified: Secondary | ICD-10-CM | POA: Diagnosis not present

## 2024-03-13 DIAGNOSIS — L400 Psoriasis vulgaris: Secondary | ICD-10-CM | POA: Diagnosis not present

## 2024-03-13 MED ORDER — ESOMEPRAZOLE MAGNESIUM 40 MG PO CPDR: 40.0000 mg | DELAYED_RELEASE_CAPSULE | Freq: Every day | ORAL | 3 refills | Status: AC

## 2024-03-13 NOTE — Progress Notes (Signed)
 GI Office Note    Referring Provider: Lavell Bari LABOR, [name] Primary Care Physician:  Lavell Bari LABOR, [name]  Primary Gastroenterologist: Ozell Hollingshead, MD   Chief Complaint   Chief Complaint  Patient presents with   Follow-up    Doing well, no issues    History of Present Illness   Discussed the use of AI scribe software for clinical note transcription with the patient, who gave verbal consent to proceed.  History of Present Illness Sherry Glass is a 53 year old female who presents for a routine follow-up. She was last seen 08/2022. She has history of fatty liver, elevated AL (negative viral markers), chronic diarrhea, N/V/epigastric pain.   Recent laboratory results from March 04, 2024, show ALT at 19 and AST at 16. Platelets 326K. She is on a GLP-1 antagonist (semaglutide  1mg  weekly) for diabetes and weight management. Her Fib-4 score is 0.62. she has lost about 15 pounds in the past one year.   She has experienced chronic diarrhea, nausea, vomiting, and epigastric pain, which have significantly improved after dietary changes, specifically reducing dairy intake. She now has regular bowel movements once a day without any blood in the stool. No current abdominal pain, nausea, or vomiting. Her heartburn is well controlled with Nexium  40mg  daily. No dysphagia.   In June 2021, she underwent a colonoscopy that revealed two hyperplastic sessile polyps in the sigmoid colon and scattered small bowel diverticula. A repeat colonoscopy is recommended in ten years unless new symptoms arise.  Her current medications include Nexium  40 mg once daily before breakfast and famotidine  20 mg at supper (RX by pulmonology).   She consumes alcohol very rarely, about once a year during holidays.   We ordered celiac serologies and stool studies after her last ov, these were not completed. Patient chose not to complete after her symptoms resolved.   Prior Data      Latest Ref Rng & Units  03/04/2024    9:51 AM 10/30/2023   12:51 PM 08/10/2023    9:42 AM 02/24/2023   10:32 AM 11/29/2022    9:33 AM  Hepatic Function  Total Protein 6.0 - 8.5 g/dL 7.0  6.7  6.8  7.1  7.1   Albumin 3.8 - 4.9 g/dL 4.6  4.2  4.4  4.5  4.4   AST 0 - 40 IU/L 16  19  24  23  30    ALT 0 - 32 IU/L 19  23  24  30   34   Alk Phosphatase 49 - 135 IU/L 104  85  87  94  98   Total Bilirubin 0.0 - 1.2 mg/dL 1.2  0.8  0.6  0.7  0.7         Latest Ref Rng & Units 03/04/2024    9:51 AM 08/10/2023    9:42 AM 02/24/2023   10:32 AM 05/06/2022   12:42 PM 11/11/2021   10:37 AM  CBC  WBC 3.4 - 10.8 x10E3/uL 10.5  8.9  9.9  8.9  9.0   Hemoglobin 11.1 - 15.9 g/dL 85.4  86.5  86.1  86.2  13.5   Hematocrit 34.0 - 46.6 % 45.1  40.5  41.9  41.7  39.5   Platelets 150 - 450 x10E3/uL 326  271  280  293  270       Medications   Current Outpatient Medications  Medication Sig Dispense Refill   albuterol  (ACCUNEB ) 1.25 MG/3ML nebulizer solution Take 3 mLs (1.25 mg  total) by nebulization every 6 (six) hours as needed for wheezing. 75 mL 12   albuterol  (VENTOLIN  HFA) 108 (90 Base) MCG/ACT inhaler Inhale 2 puffs into the lungs every 6 (six) hours as needed for wheezing or shortness of breath. 8 g 2   aspirin  EC 81 MG tablet Take one po qd 90 tablet 1   budesonide -formoterol  (SYMBICORT ) 80-4.5 MCG/ACT inhaler Take 2 puffs first thing in am and then another 2 puffs about 12 hours later. 1 each 12   busPIRone  (BUSPAR ) 5 MG tablet Take 1 tablet (5 mg total) by mouth 3 (three) times daily as needed. 90 tablet 1   cyclobenzaprine  (FLEXERIL ) 10 MG tablet TAKE ONE TABLET BY MOUTH THREE TIMES A DAY AS NEEDED FOR MUSCLE SPASMS 90 tablet 1   desvenlafaxine  (PRISTIQ ) 100 MG 24 hr tablet TAKE ONE TABLET BY MOUTH EVERY DAY 90 tablet 0   esomeprazole  (NEXIUM ) 40 MG capsule TAKE 1 CAPSULE BY MOUTH ONCE DAILY BEFORE BREAKFAST 30 capsule 5   famotidine  (PEPCID ) 20 MG tablet One after supper 30 tablet 11   gabapentin  (NEURONTIN ) 100 MG capsule  Take 1 capsule (100 mg total) by mouth 3 (three) times daily. 90 capsule 1   glimepiride  (AMARYL ) 2 MG tablet TAKE 1 TABLET BY MOUTH DAILY WITH BREAKFAST. 90 tablet 0   IBU 800 MG tablet TAKE ONE TABLET BY MOUTH TWO TIMES A DAY 180 tablet 0   levothyroxine  (SYNTHROID ) 125 MCG tablet Take 1 tablet (125 mcg total) by mouth daily before breakfast. 95 tablet 3   losartan  (COZAAR ) 100 MG tablet TAKE ONE TABLET BY MOUTH EVERY DAY 90 tablet 0   metFORMIN  (GLUCOPHAGE ) 1000 MG tablet TAKE ONE TABLET BY MOUTH TWO TIMES A DAY WITH A MEAL 180 tablet 0   metoprolol  tartrate (LOPRESSOR ) 100 MG tablet Take 1 tablet (100 mg total) by mouth once for 1 dose. 1 tablet 0   rosuvastatin  (CRESTOR ) 10 MG tablet TAKE ONE TABLET BY MOUTH EVERY DAY 90 tablet 0   Semaglutide , 1 MG/DOSE, 4 MG/3ML SOPN Inject 1 mg as directed once a week. 3 mL 3   TALTZ 80 MG/ML SOSY Inject into the skin every 30 (thirty) days.     Vitamin D , Ergocalciferol , (DRISDOL ) 1.25 MG (50000 UNIT) CAPS capsule Take 1 capsule (50,000 Units total) by mouth every 7 (seven) days. 12 capsule 3   No current facility-administered medications for this visit.    Allergies   Allergies as of 03/13/2024 - Review Complete 03/13/2024  Allergen Reaction Noted   Dilaudid [hydromorphone]  05/13/2016   Doxycycline  Itching 07/27/2021   Valproic acid and related Nausea And Vomiting 07/03/2012     Review of Systems   General: Negative for anorexia, weight loss, fever, chills, fatigue, weakness. ENT: Negative for hoarseness, difficulty swallowing , nasal congestion. CV: Negative for chest pain, angina, palpitations, dyspnea on exertion, peripheral edema.  Respiratory: Negative for dyspnea at rest, dyspnea on exertion, cough, sputum, wheezing.  GI: See history of present illness. GU:  Negative for dysuria, hematuria, urinary incontinence, urinary frequency, nocturnal urination.  Endo: Negative for unusual weight change.     Physical Exam   BP (!) 157/83    Pulse 81   Temp 98.7 F (37.1 C) (Oral)   Ht 5' 4 (1.626 m)   Wt 197 lb 9.6 oz (89.6 kg)   SpO2 97%   BMI 33.92 kg/m    General: Well-nourished, well-developed in no acute distress.  Eyes: No icterus. Mouth: Oropharyngeal mucosa moist and  pink    Abdomen: Bowel sounds are normal, nontender, nondistended, no hepatosplenomegaly or masses,  no abdominal bruits or hernia , no rebound or guarding.  Rectal: not performed Extremities: No lower extremity edema. No clubbing or deformities. Neuro: Alert and oriented x 4   Skin: Warm and dry, no jaundice.   Psych: Alert and cooperative, normal mood and affect.  Labs   See above  Imaging Studies   No results found.  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 Liver function tests stable with improvement of ALT with ALT 19, AST 16. - Monitor liver function tests and FIB-4 score yearly. - Consider fibroscan or ELF score if FIB-4 score suggests advancing fibrosis. - Continue semaglutide  for DM, weight management - Strive for at least 10% body weight reduction. - Exercise daily, minimum of 10,000 steps daily, working up to 300 minutes of exercise per week. - Avoid sweets, alcohol. - Eat low fat/low carb. - Obtain adequate protein, concentrate on plant based protein. - Return ov in one year.    Gastroesophageal reflux disease (GERD) Managed with Nexium  and famotidine . Symptoms improved with dietary changes. - Continue Nexium  40 mg daily before breakfast. - Continue famotidine  20 mg at supper.        Sherry Glass. Ezzard, MHS, PA-C [facility]

## 2024-03-13 NOTE — Progress Notes (Deleted)
 SABRA

## 2024-03-13 NOTE — Patient Instructions (Signed)
 Continue esomeprazole  40mg  daily before breakfast for acid reflux.  For fatty liver: we will continue to monitor you yearly to determine if any evidence of advancing fibrosis. This will be done through blood work mostly.  Instructions for fatty liver: Recommend 1-2# weight loss per week until ideal body weight through exercise & diet. Low fat/cholesterol diet.   Avoid sweets, sodas, fruit juices, sweetened beverages like tea, etc. Gradually increase exercise from 15 min daily up to 1 hr per day 5 days/week. Limit alcohol use.

## 2024-03-19 ENCOUNTER — Other Ambulatory Visit: Payer: Self-pay | Admitting: *Deleted

## 2024-03-19 DIAGNOSIS — E890 Postprocedural hypothyroidism: Secondary | ICD-10-CM

## 2024-03-25 DIAGNOSIS — E890 Postprocedural hypothyroidism: Secondary | ICD-10-CM | POA: Diagnosis not present

## 2024-03-26 LAB — T4, FREE: Free T4: 1.73 ng/dL (ref 0.82–1.77)

## 2024-03-26 LAB — TSH: TSH: 0.15 u[IU]/mL — ABNORMAL LOW (ref 0.450–4.500)

## 2024-04-01 ENCOUNTER — Ambulatory Visit: Payer: Medicaid Other | Admitting: "Endocrinology

## 2024-04-01 ENCOUNTER — Encounter: Payer: Self-pay | Admitting: "Endocrinology

## 2024-04-01 VITALS — BP 132/76 | HR 72 | Ht 64.0 in | Wt 198.2 lb

## 2024-04-01 DIAGNOSIS — E890 Postprocedural hypothyroidism: Secondary | ICD-10-CM | POA: Diagnosis not present

## 2024-04-01 MED ORDER — LEVOTHYROXINE SODIUM 112 MCG PO TABS: 112.0000 ug | ORAL_TABLET | Freq: Every day | ORAL | 0 refills | Status: AC

## 2024-04-01 NOTE — Progress Notes (Signed)
 04/01/2024    Endocrinology follow-up note   Subjective:    Patient ID: Sherry Glass, female    DOB: 1970/10/26, PCP Lavell Bari LABOR, [name].   Past Medical History:  Diagnosis Date   Allergic rhinitis    Anxiety and depression    Asthma    pt states this doesn't really bother her anymore   Cataract    COPD (chronic obstructive pulmonary disease) (HCC)    Depression    Diabetes mellitus without complication (HCC)    History of kidney stones    HTN (hypertension)    Hypercholesterolemia    Hypothyroidism    N&V (nausea and vomiting) 01/08/2021   Obesity    Restless leg syndrome    Tobacco abuse    Past Surgical History:  Procedure Laterality Date   CARDIAC CATHETERIZATION  2014   CATARACT EXTRACTION Right 2023   CATARACT EXTRACTION W/PHACO Left 06/23/2016   Procedure: CATARACT EXTRACTION PHACO AND INTRAOCULAR LENS PLACEMENT (IOC);  Surgeon: Cherene Mania, MD;  Location: [facility];  Service: Ophthalmology;  Laterality: Left;  left - pt knows to arrive at 10:50 cde 9.11   CESAREAN SECTION     CHOLECYSTECTOMY     COLONOSCOPY WITH PROPOFOL  N/A 10/14/2019   Procedure: COLONOSCOPY WITH PROPOFOL ;  Surgeon: Shaaron Lamar CHRISTELLA, MD;  Location: [facility];  Service: Endoscopy;  Laterality: N/A;  8:00   ESOPHAGOGASTRODUODENOSCOPY (EGD) WITH PROPOFOL  N/A 02/01/2021   Surgeon: Shaaron Lamar CHRISTELLA, MD; normal exam   EXCISION MASS NECK     cyst   left foot surgery     removal of cyst   POLYPECTOMY  10/14/2019   Procedure: POLYPECTOMY;  Surgeon: Shaaron Lamar CHRISTELLA, MD;  Location: [facility];  Service: Endoscopy;;   RADICAL ABDOMINAL HYSTERECTOMY     SHOULDER ARTHROSCOPY Left 08/05/2021   Procedure: left shoulder arthroscopy, manipulation under anesthesia, rotator interval release;  Surgeon: Addie Cordella Hamilton, MD;  Location: [facility] OR;  Service: Orthopedics;  Laterality: Left;   THYROID  SURGERY     Dr. Mavis   TONSILLECTOMY     Social History   Socioeconomic History   Marital status:  Widowed    Spouse name: Not on file   Number of children: 4   Years of education: Not on file   Highest education level: 12th grade  Occupational History   Not on file  Tobacco Use   Smoking status: Former    Current packs/day: 0.00    Average packs/day: 1 pack/day for 20.0 years (20.0 ttl pk-yrs)    Types: Cigarettes    Start date: 11/13/1996    Quit date: 10/28/2016    Years since quitting: 7.4   Smokeless tobacco: Never  Vaping Use   Vaping status: Never Used  Substance and Sexual Activity   Alcohol use: Not Currently    Comment: For special occasions, very rare    Drug use: No   Sexual activity: Never    Birth control/protection: Surgical  Other Topics Concern   Not on file  Social History Narrative   [facility] (11/18/16):   Originally from [place] but grew up in [place]. Cared for her husband until he passed. She previously worked in designer, fashion/clothing in geographical information systems officer, set designer, sewing, cutting cloth, etc. Does have significant dust exposure. Has 4 dogs currently. Previously had a parakeet as a child. No mold exposure. Enjoys playing with her grandchildren.        Social Drivers of Health   Financial Resource Strain: Low Risk  (02/13/2024)  Overall Financial Resource Strain (CARDIA)    Difficulty of Paying Living Expenses: Not very hard  Food Insecurity: No Food Insecurity (02/13/2024)   Hunger Vital Sign    Worried About Running Out of Food in the Last Year: Never true    Ran Out of Food in the Last Year: Never true  Transportation Needs: No Transportation Needs (02/13/2024)   PRAPARE - Administrator, Civil Service (Medical): No    Lack of Transportation (Non-Medical): No  Physical Activity: Insufficiently Active (02/13/2024)   Exercise Vital Sign    Days of Exercise per Week: 4 days    Minutes of Exercise per Session: 30 min  Stress: No Stress Concern Present (02/13/2024)   [employer] of Occupational Health - Occupational Stress Questionnaire    Feeling  of Stress: Only a little  Social Connections: Socially Isolated (02/13/2024)   Social Connection and Isolation Panel    Frequency of Communication with Friends and Family: Three times a week    Frequency of Social Gatherings with Friends and Family: Never    Attends Religious Services: Never    Database Administrator or Organizations: No    Attends Engineer, Structural: Not on file    Marital Status: Widowed   Outpatient Encounter Medications as of 04/01/2024  Medication Sig   albuterol  (ACCUNEB ) 1.25 MG/3ML nebulizer solution Take 3 mLs (1.25 mg total) by nebulization every 6 (six) hours as needed for wheezing.   albuterol  (VENTOLIN  HFA) 108 (90 Base) MCG/ACT inhaler Inhale 2 puffs into the lungs every 6 (six) hours as needed for wheezing or shortness of breath.   aspirin  EC 81 MG tablet Take one po qd   budesonide -formoterol  (SYMBICORT ) 80-4.5 MCG/ACT inhaler Take 2 puffs first thing in am and then another 2 puffs about 12 hours later.   busPIRone  (BUSPAR ) 5 MG tablet Take 1 tablet (5 mg total) by mouth 3 (three) times daily as needed.   cyclobenzaprine  (FLEXERIL ) 10 MG tablet TAKE ONE TABLET BY MOUTH THREE TIMES A DAY AS NEEDED FOR MUSCLE SPASMS   desvenlafaxine  (PRISTIQ ) 100 MG 24 hr tablet TAKE ONE TABLET BY MOUTH EVERY DAY   esomeprazole  (NEXIUM ) 40 MG capsule Take 1 capsule (40 mg total) by mouth daily before breakfast.   famotidine  (PEPCID ) 20 MG tablet One after supper   gabapentin  (NEURONTIN ) 100 MG capsule Take 1 capsule (100 mg total) by mouth 3 (three) times daily.   glimepiride  (AMARYL ) 2 MG tablet TAKE 1 TABLET BY MOUTH DAILY WITH BREAKFAST.   IBU 800 MG tablet TAKE ONE TABLET BY MOUTH TWO TIMES A DAY   levothyroxine  (SYNTHROID ) 112 MCG tablet Take 1 tablet (112 mcg total) by mouth daily before breakfast.   losartan  (COZAAR ) 100 MG tablet TAKE ONE TABLET BY MOUTH EVERY DAY   metFORMIN  (GLUCOPHAGE ) 1000 MG tablet TAKE ONE TABLET BY MOUTH TWO TIMES A DAY WITH A MEAL    metoprolol  tartrate (LOPRESSOR ) 100 MG tablet Take 1 tablet (100 mg total) by mouth once for 1 dose.   rosuvastatin  (CRESTOR ) 10 MG tablet TAKE ONE TABLET BY MOUTH EVERY DAY   Semaglutide , 1 MG/DOSE, 4 MG/3ML SOPN Inject 1 mg as directed once a week.   TALTZ 80 MG/ML SOSY Inject into the skin every 30 (thirty) days.   Vitamin D , Ergocalciferol , (DRISDOL ) 1.25 MG (50000 UNIT) CAPS capsule Take 1 capsule (50,000 Units total) by mouth every 7 (seven) days.   [DISCONTINUED] levothyroxine  (SYNTHROID ) 125 MCG tablet Take 1 tablet (125  mcg total) by mouth daily before breakfast.   No facility-administered encounter medications on file as of 04/01/2024.   ALLERGIES: Allergies  Allergen Reactions   Dilaudid [Hydromorphone]     Itching and vomitting   Doxycycline  Itching   Valproic Acid And Related Nausea And Vomiting   VACCINATION STATUS: Immunization History  Administered Date(s) Administered   Influenza,inj,Quad PF,6+ Mos 04/27/2017   Pneumococcal Polysaccharide-23 05/02/2005    HPI  Mrs. Steelman is status post radioactive iodine  thyroid  ablation on June 29, 2018.  She is being seen in follow-up for radioactive iodine  induced hypothyroidism.   She received RAI thyroid  ablation to treat primary hyperthyroidism on June 29, 2018.  She was subsequently initiated on  Levothyroxine .  She remains on levothyroxine  125 mcg p.o. daily before breakfast with good consistency.  Her previsit labs are consistent with slight over replacement.  She presents with some weight loss since she was started on Ozempic .  She The patient denies family history of thyroid  dysfunction- she is adopted. -She denies palpitations, tremors, nor heat intolerance. - She has well-controlled type 2 diabetes with A1c of 6% improving from 7.8%   following with her PMD, taking Metformin  1000 mg p.o. twice daily, glimepiride  2 mg p.o. daily, Ozempic  1 mg subcutaneously weekly.  - She reports that she has quit smoking after  decades of heavy smoking.     Review of Systems. Limited as above   Objective:    BP 132/76   Pulse 72   Ht 5' 4 (1.626 m)   Wt 198 lb 3.2 oz (89.9 kg)   BMI 34.02 kg/m   Wt Readings from Last 3 Encounters:  04/01/24 198 lb 3.2 oz (89.9 kg)  03/13/24 197 lb 9.6 oz (89.6 kg)  03/04/24 194 lb (88 kg)    Physical Exam   Lipid Panel     Component Value Date/Time   CHOL 128 08/10/2023 0942   TRIG 211 (H) 08/10/2023 0942   HDL 42 08/10/2023 0942   CHOLHDL 3.0 08/10/2023 0942   LDLCALC 52 08/10/2023 0942   Recent Results (from the past 2160 hours)  Bayer DCA Hb A1c Waived     Status: Abnormal   Collection Time: 03/04/24  9:50 AM  Result Value Ref Range   HB A1C (BAYER DCA - WAIVED) 6.0 (H) 4.8 - 5.6 %    Comment:          Prediabetes: 5.7 - 6.4          Diabetes: >6.4          Glycemic control for adults with diabetes: <7.0   CBC with Differential/Platelet     Status: Abnormal   Collection Time: 03/04/24  9:51 AM  Result Value Ref Range   WBC 10.5 3.4 - 10.8 x10E3/uL   RBC 4.54 3.77 - 5.28 x10E6/uL   Hemoglobin 14.5 11.1 - 15.9 g/dL   Hematocrit 54.8 65.9 - 46.6 %   MCV 99 (H) 79 - 97 fL   MCH 31.9 26.6 - 33.0 pg   MCHC 32.2 31.5 - 35.7 g/dL   RDW 86.0 88.2 - 84.5 %   Platelets 326 150 - 450 x10E3/uL   Neutrophils 46 Not Estab. %   Lymphs 41 Not Estab. %   Monocytes 6 Not Estab. %   Eos 6 Not Estab. %   Basos 1 Not Estab. %   Neutrophils Absolute 4.8 1.4 - 7.0 x10E3/uL   Lymphocytes Absolute 4.3 (H) 0.7 - 3.1 x10E3/uL   Monocytes Absolute 0.6  0.1 - 0.9 x10E3/uL   EOS (ABSOLUTE) 0.6 (H) 0.0 - 0.4 x10E3/uL   Basophils Absolute 0.1 0.0 - 0.2 x10E3/uL   Immature Granulocytes 0 Not Estab. %   Immature Grans (Abs) 0.0 0.0 - 0.1 x10E3/uL  CMP14+EGFR     Status: None   Collection Time: 03/04/24  9:51 AM  Result Value Ref Range   Glucose 99 70 - 99 mg/dL   BUN 14 6 - 24 mg/dL   Creatinine, Ser 9.27 0.57 - 1.00 mg/dL   eGFR 99 >40 fO/fpw/8.26   BUN/Creatinine  Ratio 19 9 - 23   Sodium 139 134 - 144 mmol/L   Potassium 4.8 3.5 - 5.2 mmol/L   Chloride 103 96 - 106 mmol/L   CO2 21 20 - 29 mmol/L   Calcium  9.6 8.7 - 10.2 mg/dL   Total Protein 7.0 6.0 - 8.5 g/dL   Albumin 4.6 3.8 - 4.9 g/dL   Globulin, Total 2.4 1.5 - 4.5 g/dL   Bilirubin Total 1.2 0.0 - 1.2 mg/dL   Alkaline Phosphatase 104 49 - 135 IU/L   AST 16 0 - 40 IU/L   ALT 19 0 - 32 IU/L  TSH     Status: Abnormal   Collection Time: 03/25/24 12:33 PM  Result Value Ref Range   TSH 0.150 (L) 0.450 - 4.500 uIU/mL  T4, Free     Status: None   Collection Time: 03/25/24 12:33 PM  Result Value Ref Range   Free T4 1.73 0.82 - 1.77 ng/dL     - Thyroid  uptake and scan on 07/19/2016 was nonfocal with 25% uptake and 24 hours.  March 05, 2018 thyroid  uptake and scan: FINDINGS: Normal thyroid  scan.  No hot or cold nodules are identified.   4 hour I-131 uptake = 17.6% (normal 5-20%)  24 hour I-131 uptake = 30.0% (normal 10-30%)   IMPRESSION: 1. Normal thyroid  scan. 2. Top-normal 24 hour iodine  131 uptake at 30%.  I-131 thyroid  ablation on June 29, 2018.    Assessment & Plan:   1.  RAI induced hypothyroidism  2.  Type 2 diabetes -She is status post thyroid  ablation with I-131 for significant T3 toxicosis.  Treatment was administered on June 29, 2018.   -Her previsit thyroid  function tests are consistent with over replacement.  I discussed and lowered her levothyroxine  to 112 mcg p.o. daily before breakfast.     - We discussed about the correct intake of her thyroid  hormone, on empty stomach at fasting, with water, separated by at least 30 minutes from breakfast and other medications,  and separated by more than 4 hours from calcium , iron, multivitamins, acid reflux medications (PPIs). -Patient is made aware of the fact that thyroid  hormone replacement is needed for life, dose to be adjusted by periodic monitoring of thyroid  function tests.  She has type 2 diabetes with recent  A1c of 6% improving from 7.8 %.  She is following with her PCP for diabetes management currently on metformin  1000 mg twice daily, glimepiride  2 mg p.o. daily and Ozempic  1 mg subcutaneously weekly.    She wishes to continue follow-up with her PCP for diabetes care.  - I advised patient to maintain close follow up with Lavell Bari LABOR, [name] for primary care needs, including diabetes care.   I spent  20  minutes in the care of the patient today including review of labs from Thyroid  Function, CMP, and other relevant labs ; imaging/biopsy records (current and previous including abstractions from other  facilities); face-to-face time discussing  her lab results and symptoms, medications doses, her options of short and long term treatment based on the latest standards of care / guidelines;   and documenting the encounter.  Doneta CHRISTELLA Brought  participated in the discussions, expressed understanding, and voiced agreement with the above plans.  All questions were answered to her satisfaction. she is encouraged to contact clinic should she have any questions or concerns prior to her return visit.   Follow up plan: Return in about 6 months (around 09/30/2024) for F/U with Pre-visit Labs.  Ranny Earl, MD Phone: [phone]  Fax: [phone]   04/01/2024, 12:14 PM

## 2024-05-07 ENCOUNTER — Ambulatory Visit (HOSPITAL_COMMUNITY)
Admission: RE | Admit: 2024-05-07 | Discharge: 2024-05-07 | Disposition: A | Discharge status: Home / Self Care | Source: Ambulatory Visit | Attending: Family | Admitting: Family

## 2024-05-07 DIAGNOSIS — R911 Solitary pulmonary nodule: Secondary | ICD-10-CM | POA: Insufficient documentation

## 2024-05-07 DIAGNOSIS — Z122 Encounter for screening for malignant neoplasm of respiratory organs: Secondary | ICD-10-CM | POA: Insufficient documentation

## 2024-05-07 DIAGNOSIS — Z87891 Personal history of nicotine dependence: Secondary | ICD-10-CM | POA: Insufficient documentation

## 2024-05-14 ENCOUNTER — Other Ambulatory Visit: Payer: Self-pay

## 2024-05-14 DIAGNOSIS — Z122 Encounter for screening for malignant neoplasm of respiratory organs: Secondary | ICD-10-CM

## 2024-05-14 DIAGNOSIS — Z87891 Personal history of nicotine dependence: Secondary | ICD-10-CM

## 2024-06-07 ENCOUNTER — Other Ambulatory Visit: Payer: Self-pay | Admitting: Family

## 2024-06-07 DIAGNOSIS — F320 Major depressive disorder, single episode, mild: Secondary | ICD-10-CM

## 2024-06-07 DIAGNOSIS — E1169 Type 2 diabetes mellitus with other specified complication: Secondary | ICD-10-CM

## 2024-06-07 DIAGNOSIS — F411 Generalized anxiety disorder: Secondary | ICD-10-CM

## 2024-06-07 DIAGNOSIS — I152 Hypertension secondary to endocrine disorders: Secondary | ICD-10-CM

## 2024-06-07 DIAGNOSIS — G8929 Other chronic pain: Secondary | ICD-10-CM

## 2024-06-07 DIAGNOSIS — E1165 Type 2 diabetes mellitus with hyperglycemia: Secondary | ICD-10-CM

## 2024-06-07 DIAGNOSIS — R0609 Other forms of dyspnea: Secondary | ICD-10-CM

## 2024-06-07 DIAGNOSIS — M25552 Pain in left hip: Secondary | ICD-10-CM

## 2024-06-07 NOTE — Telephone Encounter (Signed)
 Christy NTBS for 4 mos FU RFs sent to pharmacy

## 2024-09-30 ENCOUNTER — Ambulatory Visit: Admitting: "Endocrinology
